# Patient Record
Sex: Male | Born: 1955
Health system: Southern US, Community
[De-identification: ages and names within clinical notes are randomized; demographics above are authoritative.]

## PROBLEM LIST (undated history)

## (undated) DIAGNOSIS — R05 Cough: Secondary | ICD-10-CM

## (undated) DIAGNOSIS — R221 Localized swelling, mass and lump, neck: Secondary | ICD-10-CM

## (undated) DIAGNOSIS — C61 Malignant neoplasm of prostate: Secondary | ICD-10-CM

## (undated) DIAGNOSIS — K635 Polyp of colon: Secondary | ICD-10-CM

## (undated) DIAGNOSIS — Z87442 Personal history of urinary calculi: Secondary | ICD-10-CM

## (undated) DIAGNOSIS — R059 Cough, unspecified: Secondary | ICD-10-CM

## (undated) DIAGNOSIS — K219 Gastro-esophageal reflux disease without esophagitis: Secondary | ICD-10-CM

## (undated) DIAGNOSIS — B49 Unspecified mycosis: Secondary | ICD-10-CM

## (undated) DIAGNOSIS — C4490 Unspecified malignant neoplasm of skin, unspecified: Secondary | ICD-10-CM

## (undated) DIAGNOSIS — R071 Chest pain on breathing: Secondary | ICD-10-CM

## (undated) DIAGNOSIS — N200 Calculus of kidney: Secondary | ICD-10-CM

## (undated) DIAGNOSIS — R220 Localized swelling, mass and lump, head: Secondary | ICD-10-CM

## (undated) DIAGNOSIS — G629 Polyneuropathy, unspecified: Secondary | ICD-10-CM

## (undated) DIAGNOSIS — M1990 Unspecified osteoarthritis, unspecified site: Secondary | ICD-10-CM

## (undated) DIAGNOSIS — D869 Sarcoidosis, unspecified: Secondary | ICD-10-CM

## (undated) DIAGNOSIS — H269 Unspecified cataract: Secondary | ICD-10-CM

## (undated) DIAGNOSIS — E291 Testicular hypofunction: Secondary | ICD-10-CM

## (undated) DIAGNOSIS — T7840XA Allergy, unspecified, initial encounter: Secondary | ICD-10-CM

## (undated) HISTORY — PX: HERNIA REPAIR: SHX51

## (undated) HISTORY — DX: Calculus of kidney: N20.0

## (undated) HISTORY — DX: Sarcoidosis, unspecified: D86.9

## (undated) HISTORY — PX: LYMPH NODE BIOPSY: SHX201

## (undated) HISTORY — DX: Unspecified malignant neoplasm of skin, unspecified: C44.90

## (undated) HISTORY — DX: Testicular hypofunction: E29.1

## (undated) HISTORY — DX: Malignant neoplasm of prostate: C61

## (undated) HISTORY — DX: Unspecified osteoarthritis, unspecified site: M19.90

## (undated) HISTORY — PX: SHOULDER SURGERY: SHX246

## (undated) HISTORY — DX: Cough, unspecified: R05.9

## (undated) HISTORY — DX: Gastro-esophageal reflux disease without esophagitis: K21.9

## (undated) HISTORY — DX: Unspecified mycosis: B49

## (undated) HISTORY — DX: Polyp of colon: K63.5

## (undated) HISTORY — DX: Polyneuropathy, unspecified: G62.9

## (undated) HISTORY — DX: Localized swelling, mass and lump, neck: R22.1

## (undated) HISTORY — DX: Cough: R05

## (undated) HISTORY — DX: Localized swelling, mass and lump, head: R22.0

## (undated) HISTORY — DX: Chest pain on breathing: R07.1

## (undated) HISTORY — DX: Allergy, unspecified, initial encounter: T78.40XA

## (undated) HISTORY — DX: Unspecified cataract: H26.9

---

## 1982-09-17 HISTORY — PX: OTHER SURGICAL HISTORY: SHX169

## 2003-08-03 ENCOUNTER — Encounter: Payer: Self-pay | Admitting: Gastroenterology

## 2006-11-16 LAB — HM COLONOSCOPY

## 2007-12-27 ENCOUNTER — Encounter: Admission: RE | Admit: 2007-12-27 | Discharge: 2007-12-27 | Payer: Self-pay | Admitting: Family Medicine

## 2008-03-12 ENCOUNTER — Ambulatory Visit: Payer: Self-pay

## 2008-03-18 ENCOUNTER — Ambulatory Visit: Payer: Self-pay

## 2008-03-18 ENCOUNTER — Encounter: Payer: Self-pay | Admitting: Cardiovascular Disease

## 2008-03-18 ENCOUNTER — Ambulatory Visit: Payer: Self-pay | Admitting: Cardiovascular Disease

## 2008-04-27 ENCOUNTER — Ambulatory Visit (HOSPITAL_COMMUNITY): Admission: RE | Admit: 2008-04-27 | Discharge: 2008-04-27 | Payer: Self-pay | Admitting: Orthopedic Surgery

## 2008-06-18 ENCOUNTER — Ambulatory Visit: Payer: Self-pay | Admitting: Cardiovascular Disease

## 2008-09-17 HISTORY — PX: PROSTATE SURGERY: SHX751

## 2008-09-22 ENCOUNTER — Ambulatory Visit: Payer: Self-pay | Admitting: Gastroenterology

## 2008-10-05 ENCOUNTER — Encounter: Payer: Self-pay | Admitting: Gastroenterology

## 2008-10-05 ENCOUNTER — Ambulatory Visit: Payer: Self-pay | Admitting: Gastroenterology

## 2008-10-07 ENCOUNTER — Encounter: Payer: Self-pay | Admitting: Gastroenterology

## 2008-12-30 ENCOUNTER — Encounter: Admission: RE | Admit: 2008-12-30 | Discharge: 2008-12-30 | Payer: Self-pay | Admitting: Orthopedic Surgery

## 2009-02-24 ENCOUNTER — Ambulatory Visit (HOSPITAL_BASED_OUTPATIENT_CLINIC_OR_DEPARTMENT_OTHER): Admission: RE | Admit: 2009-02-24 | Discharge: 2009-02-24 | Payer: Self-pay | Admitting: Orthopedic Surgery

## 2009-05-26 ENCOUNTER — Encounter (INDEPENDENT_AMBULATORY_CARE_PROVIDER_SITE_OTHER): Payer: Self-pay | Admitting: *Deleted

## 2009-07-04 DIAGNOSIS — R0609 Other forms of dyspnea: Secondary | ICD-10-CM | POA: Insufficient documentation

## 2009-07-04 DIAGNOSIS — R0602 Shortness of breath: Secondary | ICD-10-CM

## 2009-07-04 DIAGNOSIS — R0600 Dyspnea, unspecified: Secondary | ICD-10-CM | POA: Insufficient documentation

## 2009-07-05 ENCOUNTER — Ambulatory Visit: Payer: Self-pay | Admitting: Cardiovascular Disease

## 2009-08-17 DIAGNOSIS — C61 Malignant neoplasm of prostate: Secondary | ICD-10-CM

## 2009-08-17 HISTORY — DX: Malignant neoplasm of prostate: C61

## 2009-08-22 ENCOUNTER — Inpatient Hospital Stay (HOSPITAL_COMMUNITY): Admission: RE | Admit: 2009-08-22 | Discharge: 2009-08-23 | Payer: Self-pay | Admitting: Urology

## 2009-08-22 ENCOUNTER — Encounter (INDEPENDENT_AMBULATORY_CARE_PROVIDER_SITE_OTHER): Payer: Self-pay | Admitting: Urology

## 2010-05-17 ENCOUNTER — Other Ambulatory Visit: Admission: RE | Admit: 2010-05-17 | Discharge: 2010-05-17 | Payer: Self-pay | Admitting: Otolaryngology

## 2010-05-24 ENCOUNTER — Encounter: Admission: RE | Admit: 2010-05-24 | Discharge: 2010-05-24 | Payer: Self-pay | Admitting: Otolaryngology

## 2010-07-06 ENCOUNTER — Encounter (INDEPENDENT_AMBULATORY_CARE_PROVIDER_SITE_OTHER): Payer: Self-pay | Admitting: *Deleted

## 2010-07-10 ENCOUNTER — Ambulatory Visit: Payer: Self-pay | Admitting: Infectious Disease

## 2010-07-10 DIAGNOSIS — R05 Cough: Secondary | ICD-10-CM

## 2010-07-10 DIAGNOSIS — R059 Cough, unspecified: Secondary | ICD-10-CM | POA: Insufficient documentation

## 2010-07-10 LAB — CONVERTED CEMR LAB
ALT: 18 units/L (ref 0–53)
AST: 17 units/L (ref 0–37)
Albumin: 4 g/dL (ref 3.5–5.2)
Alkaline Phosphatase: 68 units/L (ref 39–117)
BUN: 17 mg/dL (ref 6–23)
Basophils Absolute: 0 10*3/uL (ref 0.0–0.1)
Basophils Relative: 1 % (ref 0–1)
Bilirubin Urine: NEGATIVE
CMV IgM: 0.52 (ref <0.90)
CO2: 24 meq/L (ref 19–32)
Calcium: 9.8 mg/dL (ref 8.4–10.5)
Chloride: 105 meq/L (ref 96–112)
Creatinine, Ser: 1.01 mg/dL (ref 0.40–1.50)
Crypto Ag: NEGATIVE
Cytomegalovirus Ab-IgG: 0.83 (ref <0.90)
EBV NA IgG: 2.35 — ABNORMAL HIGH
EBV VCA IgG: 3.32 — ABNORMAL HIGH
EBV VCA IgM: 0.2
Eosinophils Absolute: 0.2 10*3/uL (ref 0.0–0.7)
Eosinophils Relative: 5 % (ref 0–5)
Ferritin: 141 ng/mL (ref 22–322)
Glucose, Bld: 111 mg/dL — ABNORMAL HIGH (ref 70–99)
HCT: 41.6 % (ref 39.0–52.0)
HIV 1 RNA Quant: <20 copies/mL (ref <20)
HIV-1 RNA Quant, Log: <1.3 (ref <1.30)
HIV: NONREACTIVE
Hemoglobin, Urine: NEGATIVE
Hemoglobin: 14.3 g/dL (ref 13.0–17.0)
Ketones, ur: NEGATIVE mg/dL
Leukocytes, UA: NEGATIVE
Lymphocytes Relative: 23 % (ref 12–46)
Lymphs Abs: 1.2 10*3/uL (ref 0.7–4.0)
MCHC: 34.4 g/dL (ref 30.0–36.0)
MCV: 83.5 fL (ref 78.0–100.0)
Monocytes Absolute: 0.8 10*3/uL (ref 0.1–1.0)
Monocytes Relative: 15 % — ABNORMAL HIGH (ref 3–12)
Neutro Abs: 3 10*3/uL (ref 1.7–7.7)
Neutrophils Relative %: 57 % (ref 43–77)
Nitrite: NEGATIVE
Platelets: 146 10*3/uL — ABNORMAL LOW (ref 150–400)
Potassium: 4.5 meq/L (ref 3.5–5.3)
Protein, ur: NEGATIVE mg/dL
RBC: 4.98 M/uL (ref 4.22–5.81)
RDW: 13 % (ref 11.5–15.5)
Sed Rate: 14 mm/hr (ref 0–16)
Sodium: 144 meq/L (ref 135–145)
Specific Gravity, Urine: 1.013 (ref 1.005–1.030)
Total Bilirubin: 0.7 mg/dL (ref 0.3–1.2)
Total Protein: 6.6 g/dL (ref 6.0–8.3)
Toxoplasma Antibody- IgM: <0.1
Toxoplasma IgG Ratio: 0.7
Urine Glucose: NEGATIVE mg/dL
Urobilinogen, UA: 0.2 (ref 0.0–1.0)
WBC: 5.2 10*3/uL (ref 4.0–10.5)
pH: 6.5 (ref 5.0–8.0)

## 2010-07-13 ENCOUNTER — Encounter: Payer: Self-pay | Admitting: Infectious Disease

## 2010-07-14 ENCOUNTER — Encounter: Payer: Self-pay | Admitting: Infectious Disease

## 2010-07-14 ENCOUNTER — Telehealth: Payer: Self-pay | Admitting: Infectious Disease

## 2010-07-17 ENCOUNTER — Encounter: Payer: Self-pay | Admitting: Infectious Disease

## 2010-07-18 ENCOUNTER — Ambulatory Visit: Payer: Self-pay | Admitting: Infectious Disease

## 2010-07-18 ENCOUNTER — Ambulatory Visit (HOSPITAL_COMMUNITY): Admission: RE | Admit: 2010-07-18 | Discharge: 2010-07-18 | Payer: Self-pay | Admitting: Infectious Disease

## 2010-07-19 ENCOUNTER — Encounter: Payer: Self-pay | Admitting: Infectious Disease

## 2010-07-26 ENCOUNTER — Ambulatory Visit: Payer: Self-pay | Admitting: Infectious Disease

## 2010-08-08 ENCOUNTER — Telehealth (INDEPENDENT_AMBULATORY_CARE_PROVIDER_SITE_OTHER): Payer: Self-pay | Admitting: *Deleted

## 2010-08-09 ENCOUNTER — Ambulatory Visit (HOSPITAL_COMMUNITY): Admission: RE | Admit: 2010-08-09 | Discharge: 2010-08-09 | Payer: Self-pay | Admitting: Otolaryngology

## 2010-08-09 ENCOUNTER — Encounter: Payer: Self-pay | Admitting: Infectious Disease

## 2010-08-15 ENCOUNTER — Encounter: Payer: Self-pay | Admitting: Infectious Disease

## 2010-08-17 DIAGNOSIS — D869 Sarcoidosis, unspecified: Secondary | ICD-10-CM

## 2010-08-17 HISTORY — DX: Sarcoidosis, unspecified: D86.9

## 2010-08-18 ENCOUNTER — Encounter: Payer: Self-pay | Admitting: Infectious Disease

## 2010-08-29 ENCOUNTER — Encounter: Payer: Self-pay | Admitting: Infectious Disease

## 2010-08-29 ENCOUNTER — Ambulatory Visit: Payer: Self-pay | Admitting: Infectious Disease

## 2010-09-17 DIAGNOSIS — K219 Gastro-esophageal reflux disease without esophagitis: Secondary | ICD-10-CM

## 2010-09-17 HISTORY — DX: Gastro-esophageal reflux disease without esophagitis: K21.9

## 2010-09-20 ENCOUNTER — Telehealth: Payer: Self-pay | Admitting: Gastroenterology

## 2010-09-20 ENCOUNTER — Encounter: Payer: Self-pay | Admitting: Gastroenterology

## 2010-09-25 ENCOUNTER — Encounter: Payer: Self-pay | Admitting: Infectious Disease

## 2010-09-28 ENCOUNTER — Encounter: Payer: Self-pay | Admitting: Gastroenterology

## 2010-09-28 ENCOUNTER — Ambulatory Visit
Admission: RE | Admit: 2010-09-28 | Discharge: 2010-09-28 | Payer: Self-pay | Source: Home / Self Care | Attending: Gastroenterology | Admitting: Gastroenterology

## 2010-09-28 DIAGNOSIS — D860 Sarcoidosis of lung: Secondary | ICD-10-CM | POA: Insufficient documentation

## 2010-09-28 DIAGNOSIS — D869 Sarcoidosis, unspecified: Secondary | ICD-10-CM | POA: Insufficient documentation

## 2010-09-28 DIAGNOSIS — R195 Other fecal abnormalities: Secondary | ICD-10-CM | POA: Insufficient documentation

## 2010-09-28 DIAGNOSIS — K219 Gastro-esophageal reflux disease without esophagitis: Secondary | ICD-10-CM | POA: Insufficient documentation

## 2010-09-28 DIAGNOSIS — C61 Malignant neoplasm of prostate: Secondary | ICD-10-CM | POA: Insufficient documentation

## 2010-10-01 ENCOUNTER — Encounter: Payer: Self-pay | Admitting: Infectious Disease

## 2010-10-02 ENCOUNTER — Encounter: Payer: Self-pay | Admitting: Infectious Disease

## 2010-10-02 ENCOUNTER — Ambulatory Visit
Admission: RE | Admit: 2010-10-02 | Discharge: 2010-10-02 | Payer: Self-pay | Source: Home / Self Care | Attending: Infectious Disease | Admitting: Infectious Disease

## 2010-10-02 LAB — CONVERTED CEMR LAB
BUN: 22 mg/dL (ref 6–23)
Basophils Absolute: 0 10*3/uL (ref 0.0–0.1)
Basophils Relative: 0 % (ref 0–1)
CO2: 28 meq/L (ref 19–32)
Calcium: 9.9 mg/dL (ref 8.4–10.5)
Chloride: 101 meq/L (ref 96–112)
Creatinine, Ser: 0.95 mg/dL (ref 0.40–1.50)
Eosinophils Absolute: 0.1 10*3/uL (ref 0.0–0.7)
Eosinophils Relative: 1 % (ref 0–5)
Glucose, Bld: 102 mg/dL — ABNORMAL HIGH (ref 70–99)
HCT: 43 % (ref 39.0–52.0)
Hemoglobin: 13.9 g/dL (ref 13.0–17.0)
Lymphocytes Relative: 7 % — ABNORMAL LOW (ref 12–46)
Lymphs Abs: 0.7 10*3/uL (ref 0.7–4.0)
MCHC: 32.3 g/dL (ref 30.0–36.0)
MCV: 86.9 fL (ref 78.0–100.0)
Monocytes Absolute: 0.7 10*3/uL (ref 0.1–1.0)
Monocytes Relative: 6 % (ref 3–12)
Neutro Abs: 9.3 10*3/uL — ABNORMAL HIGH (ref 1.7–7.7)
Neutrophils Relative %: 86 % — ABNORMAL HIGH (ref 43–77)
Platelets: 166 10*3/uL (ref 150–400)
Potassium: 4.8 meq/L (ref 3.5–5.3)
RBC: 4.95 M/uL (ref 4.22–5.81)
RDW: 12.9 % (ref 11.5–15.5)
Sodium: 137 meq/L (ref 135–145)
WBC: 10.8 10*3/uL — ABNORMAL HIGH (ref 4.0–10.5)

## 2010-10-04 ENCOUNTER — Encounter: Payer: Self-pay | Admitting: Infectious Disease

## 2010-10-06 ENCOUNTER — Ambulatory Visit
Admission: RE | Admit: 2010-10-06 | Discharge: 2010-10-06 | Payer: Self-pay | Source: Home / Self Care | Attending: Gastroenterology | Admitting: Gastroenterology

## 2010-10-06 ENCOUNTER — Other Ambulatory Visit: Payer: Self-pay | Admitting: Gastroenterology

## 2010-10-08 ENCOUNTER — Encounter: Payer: Self-pay | Admitting: Family Medicine

## 2010-10-12 ENCOUNTER — Encounter: Payer: Self-pay | Admitting: Gastroenterology

## 2010-10-17 NOTE — Consult Note (Signed)
Summary: New Pt. Referral: [facility]  New Pt. Referral: [facility]   Imported By: Florinda Marker 07/18/2010 10:35:53  _____________________________________________________________________  External Attachment:    Type:   Image     Comment:   External Document

## 2010-10-17 NOTE — Progress Notes (Signed)
Summary: Records Request  Faxed Stress to Clydie Braun at [facility] ([phone]). Debby Freiberg  August 08, 2010 12:40 PM

## 2010-10-17 NOTE — Assessment & Plan Note (Signed)
Summary: 2WK F/U/OK PER DR VAM DAM/VS   Visit Type:  Follow-up Referring Provider:  Dr. [name]  CC:  coughing and f/u.  History of Present Illness: 55 yo with cough since April, at times to point of gagging and vomiting. Cough brings up phlegm. He has not had noticeable fever. His weight has been steady. He went to PCP who did CXR's at [facility]. He has been given antibiotics which did not help with the cough. His lymph nodes began to enlarge in August and he had CT scan in September showing mx LN enlarged bilaterally and tree and bud findings in apices with an area of conglomeration.  He had excisional LN biopsy which showed noncaseating granulomata. No AFB or fungi seen on stains but no cultures were done from this LN. .Pt has not noticed change in LN size other than one that was removed. Has lived in [place] for most of his life. He has been to [place], [place]. No other travel. No weight loss, no fevers, no chills. His mother has cat and kittens but he has littel exposure to them. I checked quantiferon gold which was indeterminate. I had 3 sputa sent for AFB with smears negative and cultures negative to date. I checked HIV ab and RNA, urine histoplasma ag, serum crypto ag, blastomyces abs, coccidioides abs, bartonella abs, toxoplasmosis abs, CMV which were all negative. EBV serologies c/w past infection. ESR normal. ACE level nml. I repeated Ct of neck which showed:  Right greater than left widespread cervical and thoracic inlet lymphadenopathy, stable aside from mild progression at left levelIIA. I did CT chest which showed:  Bilateral interstitial and micronodular opacities (primarily upperlobes), calcified mediastinal/hilar lymph nodes and multiple smallhypodense splenic lesions/granulomas -" compatible with sarcoid."   I spent over45 on this visit with greater than 50% of time spent face to face counselling the pt reviewing his tests and radiographs and coordinating care.  Preventive  Screening-Counseling & Management  Alcohol-Tobacco     Alcohol drinks/day: 0     Smoking Status: never   Current Allergies (reviewed today): No known allergies  Past History:  Past Medical History: Last updated: 07/10/2010 Current Problems:  DYSPNEA (ICD-786.05) CHEST PAIN (ICD-786.50) normal myovue 02/2008 Prostate  cancer Nasal polyps in nose allergies to corn, pollen dust mites  Past Surgical History: Last updated: 07/10/2010  Open resection distal right clavicle.    Right shoulder surgery LN excisional biopsy by Dr. Arn Medal Hernia repair 13 yrs ago  Family History: Last updated: 07/04/2009  remarkable for colon cancer.   Social History: Last updated: 07/05/2009 Married Works at [employer] Non-smoker Non-drinker Primary is Vernon Prey  Risk Factors: Alcohol Use: 0 (07/26/2010)  Risk Factors: Smoking Status: never (07/26/2010)  Review of Systems       The patient complains of dyspnea on exertion, prolonged cough, and headaches.  The patient denies anorexia, fever, weight loss, weight gain, vision loss, decreased hearing, hoarseness, chest pain, syncope, peripheral edema, hemoptysis, abdominal pain, melena, hematochezia, severe indigestion/heartburn, hematuria, incontinence, genital sores, muscle weakness, suspicious skin lesions, transient blindness, difficulty walking, depression, unusual weight change, abnormal bleeding, enlarged lymph nodes, and angioedema.    Vital Signs:  Patient profile:   55 year old male Height:      70 inches (177.80 cm) Weight:      179.75 pounds (81.70 kg) BMI:     25.88 Temp:     98.1 degrees F (36.72 degrees C) oral Pulse rate:   80 / minute BP sitting:  133 / 68  (left arm)  Vitals Entered By: Starleen Arms CMA (July 26, 2010 10:16 AM) CC: coughing, f/u Is Patient Diabetic? No Pain Assessment Patient in pain? no      Nutritional Status BMI of 25 - 29 = overweight Nutritional Status Detail nl  Does  patient need assistance? Functional Status Self care Ambulation Normal   Physical Exam  General:  alert, well-developed, and well-nourished.   Head:  normocephalic, atraumatic, and no abnormalities observed.   Eyes:  vision grossly intact, pupils equal, and pupils round.   Ears:  no external deformities.   Nose:  no external deformity and no nasal discharge.   Mouth:  pharynx pink and moist, no erythema, and no exudates.   Neck:  supple and full ROM.   Lungs:  coughs easilyno crackles and no wheezes.   Heart:  normal rate, regular rhythm, no murmur, no gallop, and no rub.   Abdomen:  soft and non-tender.   Msk:  normal ROM, no joint tenderness, and no joint swelling.   Extremities:  No clubbing, cyanosis, edema, or deformity noted with normal full range of motion of all joints.   Neurologic:  alert & oriented X3, strength normal in all extremities, and gait normal.   Skin:  turgor normal, color normal, and no rashes.   Cervical Nodes:  he has large freely movable tender lymphadenopathy Psych:  Oriented X3, memory intact for recent and remote, and normally interactive.     Impression & Recommendations:  Problem # 1:  ACUTE LYMPHADENITIS (ICD-683)  I told him that I REALLY WOULD LIKE TO get repeat material for AFB, fungal, bacterial cultures and pathology. This could be done with FNA but I would prefer repeat excisional Lymph node biopsy. I think sarcoid is high on the differential but if I treat him with steroids and he has a nonTB mycobacteria, TB or fungal infection and this blossoms I will not know what anti mycobacterial vs antifungal agent to reach for. I will ask Dr. [name] if he can do repeat exicisional LN biopsy from neck and ahve it sent for AFB, fungal and bacterial cultures and repeat Path exam  Orders: Est. Patient Level V ([address])  Problem # 2:  PULMONARY NODULE (ICD-518.89)  see above discussion  Orders: Est. Patient Level V (60454)  Problem # 3:  COUGH  (ICD-786.2)  see above  Orders: Est. Patient Level V (09811)  Patient Instructions: 1)  I would recommend repeat excisional Lymph node biopsy by Dr.Byers  with specimens sent for AFB, fungal and bacterial cultures and pathology 2)  rtc to see Dr. Daiva Eves 3)  the 3 leading contenders for cause of your problem are 4)  sarcoidoisis 5)  non tuberculous mycobacterium (mycobacterium avium, chelonae ex) 6)  fungi (cryptococcus, histoplasmosis, coccidioides, blastomyces)

## 2010-10-17 NOTE — Progress Notes (Signed)
Summary: Pt called with additional info  Phone Note Call from Patient   Summary of Call: Pt had been to [facility] once therefore testing for cocci is in order .Will send serology for cocci as well. WIll likely have to push for bronch or new excisional LN biopsy to know what caused his LN enlargement and his ongoing cough Initial call taken by: Acey Lav MD,  July 17, 2010 9:40 AM

## 2010-10-17 NOTE — Letter (Signed)
Summary: Out of Work  [facility]  [address]   [place], [place] [address]   Phone: [phone]  Fax: [phone]    July 10, 2010   Employee:  Philip Hernandez    To Whom It May Concern:   For Medical reasons, please excuse the above named employee from work for the following dates:  Start:   07/10/10   End:   to be determined  If you need additional information, please feel free to contact our office.         Sincerely,    Acey Lav MD

## 2010-10-17 NOTE — Miscellaneous (Signed)
Summary: clinical list update  Clinical Lists Changes  Problems: Added new problem of NECK MASS (ICD-784.2)

## 2010-10-17 NOTE — Miscellaneous (Signed)
Summary: HIPAA Restrictions  HIPAA Restrictions   Imported By: Florinda Marker 07/10/2010 14:48:59  _____________________________________________________________________  External Attachment:    Type:   Image     Comment:   External Document

## 2010-10-17 NOTE — Assessment & Plan Note (Signed)
Summary: new pt neck mass/   Visit Type:  Consult Referring Provider:  Dr. [name]  CC:  referred by Dr. [name] for neck mass.  History of Present Illness: 55 yo with cough since April, at times to point of gagging and vomiting. Cough brings up phlegm. He has not had noticeable fever. His weight has been steady. He went to PCP who did CXR's at [facility]. He has been given antibiotics which did not help with the cough. His lymph nodes began to enlarge in August and he had CT scan in September showing mx LN enlarged bilaterally and tree and bud findings in apices with an area of conglomeration.  He had excisional LN biopsy which showed noncaseating granulomata. No AFB or fungi seen on stains but no cultures were done from this LN. .Pt has not noticed change in LN size other than one that was removed. Has lived in [place] for most of his life. He has been to [place], [place]. No other travel. No weight loss, no fevers, no chills. His mother has cat and kittens but he has littel exposure to them. I reviewed his CT neck and chest with radiology and they felt that this defniintely could be pulmonary TB esp in areas of more confluent inflammatoin..  I spent over an hour on this visit with greater than 50% of time spent face to face counselling the pt and coordinating care.  Problems Prior to Update: 1)  Neck Mass  (ICD-784.2) 2)  Dyspnea  (ICD-786.05) 3)  Chest Pain  (ICD-786.50)  Medications Prior to Update: 1)  Aspirin Ec 325 Mg Tbec (Aspirin) .... As Needed 2)  Flomax 0.4 Mg Caps (Tamsulosin Hcl) .Marland Kitchen.. 1 Tab By Mouth Once Daily 3)  Ibuprofen 400 Mg Tabs (Ibuprofen) .... As Needed 4)  Claritin 10 Mg Tabs (Loratadine) .... As Needed  Current Medications (verified): 1)  Aspirin Ec 325 Mg Tbec (Aspirin) .... As Needed 2)  Ibuprofen 400 Mg Tabs (Ibuprofen) .... As Needed 3)  Claritin 10 Mg Tabs (Loratadine) .... As Needed  Allergies (verified): No Known Drug Allergies    Current Allergies  (reviewed today): No known allergies  Past History:  Past Medical History: Current Problems:  DYSPNEA (ICD-786.05) CHEST PAIN (ICD-786.50) normal myovue 02/2008 Prostate  cancer Nasal polyps in nose allergies to corn, pollen dust mites  Past Surgical History:  Open resection distal right clavicle.    Right shoulder surgery LN excisional biopsy by Dr. Arn Medal Hernia repair 13 yrs ago  Review of Systems       The patient complains of chest pain, dyspnea on exertion, prolonged cough, and enlarged lymph nodes.  The patient denies anorexia, fever, weight loss, weight gain, vision loss, decreased hearing, hoarseness, syncope, peripheral edema, headaches, hemoptysis, abdominal pain, melena, hematochezia, severe indigestion/heartburn, hematuria, incontinence, genital sores, muscle weakness, suspicious skin lesions, transient blindness, difficulty walking, depression, unusual weight change, and abnormal bleeding.    Vital Signs:  Patient profile:   55 year old male Height:      70 inches (177.80 cm) Weight:      179.75 pounds (81.70 kg) BMI:     25.88 Temp:     98.1 degrees F (36.72 degrees C) oral Pulse rate:   98 / minute BP sitting:   155 / 117  (left arm)  Vitals Entered By: Starleen Arms CMA (July 10, 2010 1:57 PM) CC: referred by Dr. [name] for neck mass Is Patient Diabetic? No Pain Assessment Patient in pain? no  Nutritional Status BMI of 25 - 29 = overweight Nutritional Status Detail nl  Does patient need assistance? Functional Status Self care Ambulation Normal   Physical Exam  General:  alert, well-developed, and well-nourished.   Head:  normocephalic, atraumatic, and no abnormalities observed.   Eyes:  vision grossly intact, pupils equal, and pupils round.   Ears:  no external deformities.   Nose:  no external deformity and no nasal discharge.   Mouth:  pharynx pink and moist, no erythema, and no exudates.   Neck:  supple and full ROM.   Lungs:   coughs easilyno crackles and no wheezes.   Heart:  normal rate, regular rhythm, no murmur, no gallop, and no rub.   Abdomen:  soft and non-tender.   Msk:  normal ROM, no joint tenderness, and no joint swelling.   Extremities:  No clubbing, cyanosis, edema, or deformity noted with normal full range of motion of all joints.   Neurologic:  alert & oriented X3, strength normal in all extremities, and gait normal.   Cervical Nodes:  he has large freely movable tender lymphadenopathy Psych:  Oriented X3, memory intact for recent and remote, and normally interactive.     Impression & Recommendations:  Problem # 1:  ACUTE LYMPHADENITIS (ICD-683) Concern from pt and public health standpoint is for TB. I think it is less likely given granulomas were noncaseating and no afb seen on path slides and ppd negative. However this diagnosis is by NO means ruled out and with data at hand. It could also be a NTmycobacteria, fungus, CSD, toxo or sarcoidosisI will check quantiferon gold now, urine histo ag, serum blasto, serum crypto ag, cmv, ebv serologies, hiv ab and rna AFB sputa x3, urine histoplasma ag, serum ACE level. In the interim he should wear a mask while in public and minimize public exposure until we have more data Orders: T-Comprehensive Metabolic Panel [id number]) T-C-Reactive Protein [id number]) T-CBC w/Diff [id number]) T-Ferritin [id number]) T-Sed Rate (Automated) [id number]) T- * Misc. Laboratory test [id number]) T- * Misc. Laboratory test [id number]) T- * Misc. Laboratory test [id number]) T-HIV Antibody  (Reflex) [id number]) T-HIV Viral Load [id number]) T- * Misc. Laboratory test [phone]) T- * Misc. Laboratory test [id number]) T- * Misc. Laboratory test [id number]) T- * Misc. Laboratory test [id number]) T-CMV IgG Antibody [phone]) T-CMV IgM  Antibody ([id number]) T-Epstein-Barr virus, early antigen ([id number]) T-Epstein-Barr virus, nuclear antigen [phone]) T-Epstein-Barr virus, viral  capsid ([id number]) CT with Contrast (CT w/ contrast) Consultation Level V (18299)  Problem # 2:  COUGH (ICD-786.2) see above discussion Orders: T-Culture & Smear AFB ([id number]) T-Culture & Smear AFB ([id number]) T-Culture & Smear AFB ([id number]) CT with Contrast (CT w/ contrast) Consultation Level V (37169)  Problem # 3:  NECK MASS (ICD-784.2)  see above discussion  Orders: Consultation Level V (67893)  Patient Instructions: 1)  We need to get 3 sputum samples for AFB culture  2)  We will get blood work today 3)  I will get a repeat CT scan of the lungs, chest 4)  You should wear a mask at all times when in public 5)  Minimize your time in public places for now 6)  rtc to see Dr. Daiva Eves

## 2010-10-19 NOTE — Procedures (Signed)
Summary: colonoscopy   Colonoscopy  Procedure date:  08/03/2003  Findings:      Results: Normal. Location:  [facility].    Comments:      Repeat colonoscopy in 5 years.  Patient Name: Philip Hernandez, Philip Hernandez MRN:  Procedure Procedures: Colonoscopy CPT: [address].  Personnel: Endoscopist: Barbette Hair. Arlyce Dice, MD.  Indications  Increased Risk Screening: For family history of colorectal neoplasia, in  parent age at onset: 33.  History  Current Medications: Patient is not currently taking Coumadin.  Pre-Exam Physical: Performed Aug 03, 2003. Entire physical exam was normal.  Exam Exam: Extent of exam reached: Cecum, extent intended: Cecum.  The cecum was identified by IC valve. Colon retroflexion performed. ASA Classification: I. Tolerance: good.  Monitoring: Pulse and BP monitoring, Oximetry used. Supplemental O2 given. at 2 Liters.  Colon Prep Used Golytely for colon prep. Prep results: good.  Sedation Meds: Fentanyl 100 mcg. given IV. Versed 8 mg. given IV.  Findings NORMAL EXAM: Cecum.  - NORMAL EXAM: Cecum to Rectum.   Assessment Normal examination.  Events  Unplanned Interventions: No intervention was required.  Unplanned Events: There were no complications. Plans Patient Education: Patient given standard instructions for: a normal exam.  Scheduling/Referral: Colonoscopy, to Molly Maduro D. Arlyce Dice, MD, around Aug 02, 2008.    This report was created from the original endoscopy report, which was reviewed and signed by the above listed endoscopist.

## 2010-10-19 NOTE — Assessment & Plan Note (Signed)
Summary: 31month f/u [mkj]   Visit Type:  Follow-up Referring Provider:  na Primary Provider:  Rudi Heap, MD   CC:  f/u  and red bumps on face and chest.  History of Present Illness: 55 yo with cough since April, and also developed cervical lymphadenopathy and apparent sarcoidosis.  He had excisional LN biopsy which showed noncaseating granulomata in the fall but no cultures had been sent CT chest which showed: Bilateral interstitial and micronodular opacities (primarily upperlobes), calcified mediastinal/hilar lymph nodes and multiple smallhypodense splenic lesions/granulomas -" compatible with sarcoid."He brought expectorated sputum to clinic which was sent for AFB culture and which grew pseudoascherlia boydii (mould)-1 culture , Mycobacterium fortuitum/mucogenicum 2nd culture and no AFB on remaining 2 sputum cultures. He underwent repeat excisinal LN biopsy by Dr. [name]. Path showed noncaseating granulomata with AFB, fungal stains negative and MTB PCR negative Quantiferon gold which was indeterminate, ACE level was normal (see remaining labs for fungal abs anca's etc). At my last clinic I decided to go ahead an treat him for sarcoid with prednisone. He responded dramatically and says he felt better the very day that he began steroids. He has felt dramatic improvement in his energy level and his cough has also dramatically improved. Unfortunately while on the steroids he developed abdominal pain and was found to have guiac positive stools. He is to have EGD this Frday by Dr Arlyce Dice. I had his PCP drop his prednisone  from  40mg  to 20mg  and she started him omeprazole. Since dropping the dose he has had return of ssx of fatigue but cough no worse. He also has noticed papular rash on his face and shoulders.  I spent 45 minutes with this pt including greater than 50% of time counselling the pt and coordinating care. 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 As Needed 2)  Ibuprofen 400 Mg Tabs (Ibuprofen) .... As Needed 3)  Claritin 10 Mg Tabs (Loratadine) .... As Needed 4)  Prednisone 20 Mg Tabs (Prednisone) .... One Tablet By Mouth Once Daily 5)  Vitamin D 1000 Unit Tabs (Cholecalciferol) .... One Tablet By Mouth Once Daily 6)  Omeprazole 40 Mg Cpdr (Omeprazole) .... One Tablet By Mouth Once Daily 7)  Hyomax-Sl 0.125 Mg Subl (Hyoscyamine Sulfate) .... Take 2 Tablets Sublingual Q.4 H. P.r.n.  Current Medications (verified): 1)  Aspirin Ec 325 Mg Tbec (Aspirin) .... As Needed 2)  Ibuprofen 400 Mg Tabs (Ibuprofen) .... As Needed 3)  Claritin 10 Mg Tabs (Loratadine) .... As Needed 4)  Prednisone 20 Mg Tabs (Prednisone) .... One Tablet By Mouth Once Daily 5)  Vitamin D 1000 Unit Tabs (Cholecalciferol) .... One Tablet By Mouth Once Daily 6)  Omeprazole 40 Mg Cpdr (Omeprazole) .... One Tablet By Mouth Once Daily 7)  Hyomax-Sl 0.125 Mg Subl (Hyoscyamine Sulfate) .... Take 2 Tablets Sublingual Q.4 H. P.r.n.  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   Right shoulder surgery LN excisional biopsy by Dr. Arn Medal Hernia repair 13 yrs ago Prostate Surgery   Family History: Last updated: 09/28/2010  remarkable for colon cancer.  aunt with head and neck cancer  Social History: Last updated: 07/05/2009 Married Works at [employer] Non-smoker Non-drinker Primary is Vernon Prey  Risk Factors: Alcohol Use: 0 (07/26/2010)  Risk Factors: Smoking Status: never (07/26/2010)  Family History: Reviewed history from 09/28/2010 and no changes required.  remarkable for colon cancer.  aunt with head and neck cancer  Social History: Reviewed history from 07/05/2009 and no changes required. Married Works at [employer] Non-drinker Primary is Vernon Prey  Review of Systems       see HPI otherwise negative  Vital Signs:  Patient profile:   55 year old male Height:      70 inches (177.80 cm) Weight:      182 pounds (82.73 kg) BMI:     26.21 Temp:     97.9 degrees F (36.61 degrees C) oral Pulse rate:   77 / minute BP sitting:   146 / 92  (left arm)  Vitals Entered By: Starleen Arms CMA (October 02, 2010 9:38 AM) CC: f/u , red bumps on face and chest Is Patient Diabetic? No Pain Assessment Patient in pain? no      Nutritional Status BMI of 25 - 29 = overweight  Does patient need assistance? Functional Status Self care Ambulation Normal   Physical Exam  General:  alert, well-developed, and well-nourished.   Head:  normocephalic, atraumatic, and no abnormalities observed.   Eyes:  vision grossly intact, pupils equal, and pupils round.   Ears:  no external deformities.   Nose:  no external deformity and no nasal discharge.   Mouth:  pharynx pink and  moist, no erythema, and no exudates.   Neck:  see cervical ln Lungs:  normal respiratory effort, normal breath sounds, and no dullness.   Heart:  normal rate, regular rhythm, no murmur, and no gallop.   Abdomen:  soft and non-tender.   Msk:  normal ROM, no joint tenderness, and no joint swelling.   Extremities:  No clubbing, cyanosis, edema, or deformity noted with normal full range of motion of all joints.   Neurologic:  alert & oriented X3, strength normal in all extremities, and gait normal.   Skin:  papular rash on face and chest with some acneiform rash there as well Cervical Nodes:  only a few enlarged cervical lymph nodes that are  diminished Psych:  Oriented X3, memory intact for recent and remote, and normally interactive.     Impression & Recommendations:  Problem # 1:  SARCOIDOSIS (ICD-135) I am referring him to [facility] for evaluation and treatment. If he has ulcer on EGD would consider dropping the dose to 10mg . Per uptodate pt should ideally receive one yewar of therapy. OF note we never did MRI of brain and he did have significant head achees that improved with his therapy Orders: Pulmonary Referral (Pulmonary) T-CBC w/Diff ([id number]) T-Basic Metabolic Panel [phone])  Est. Patient Level V [phone])  Problem # 2:  NONSPECIFIC ABNORMAL FINDING IN STOOL CONTENTS (ICD-792.1) pt to get egd this Friday  Problem # 3:  ATYPICAL MYCOBACTERIAL INFECTION (ICD-031.9)  pt had mycobacterium mucogenicum on sputum culture and a mold on a second one, I think these are both contaminants  Orders: Est. Patient Level V (82956)   Patient Instructions: 1)  we will refer you to [facility] 2)  we will see what the EGD shows this Friday and may drop the prednisone dose 3)  rtc to Dr Daiva Eves prn

## 2010-10-19 NOTE — Assessment & Plan Note (Signed)
Summary: F/U [MKJ]   Visit Type:  Follow-up Referring Provider:  Dr. [name]  CC:  pain in chest started about 4 weeks ago, not cough as much, and had LN removed 3 weeks ago.  History of Present Illness: 55 yo with cough since April, at times to point of gagging and vomiting. Cough brings up phlegm. He has not had noticeable fever. His weight has been steady. He went to PCP who did CXR's at [facility]. He has been given antibiotics which did not help with the cough. His cervical lymph nodes began to enlarge in August and he had CT scan in September showing mx LN enlarged bilaterally and tree and bud findings in apices with an area of conglomeration.  He had excisional LN biopsy which showed noncaseating granulomata. No AFB or fungi seen on stains but no cultures were done at that time. CT chest which showed: Bilateral interstitial and micronodular opacities (primarily upperlobes), calcified mediastinal/hilar lymph nodes and multiple smallhypodense splenic lesions/granulomas -" compatible with sarcoid."He brought expectorated sputum to clinic which was sent for AFB culture and which grew pseudoascherlia boydii (mould)-1 culture , Mycobacterium fortuitum/mucogenicum 2nd culture and no AFB on remaining 2 sputum cultures. He underwent repeat excisinal LN biopsy by Dr. [name]. Path showed noncaseating granulomata with AFB, fungal stains negative and MTB PCR negative.  Quantiferon gold which was indeterminate, ACE level was normal (see remaining labs for fungal abs anca's etc) Pt has continued to suffer from cough and then developed pleuritic chest pain, sharp 7/10  left sided worse with deep breathing and lying down.  Saw pCP and did CXR which showed no changes.. She gave him cough medicine with codeine to help the pain. Still hurting since. He is also now suffering from nausea without vomiting. He feels profoundly0% fatigued and also has frontal headaches that have been going on for several  months. I spent over 45 minutes with this pt including greater than 50% of time in face to face counselling of the pt and his wife and in coordination of care.  Problems Prior to Update: 1)  Pulmonary Nodule  (ICD-518.89) 2)  Cough  (ICD-786.2) 3)  Acute Lymphadenitis  (ICD-683) 4)  Neck Mass  (ICD-784.2) 5)  Dyspnea  (ICD-786.05) 6)  Chest Pain  (ICD-786.50)  Medications Prior to Update: 1)  Aspirin Ec 325 Mg Tbec (Aspirin) .... As Needed 2)  Ibuprofen 400 Mg Tabs (Ibuprofen) .... As Needed 3)  Claritin 10 Mg Tabs (Loratadine) .... As Needed  Current Medications (verified): 1)  Aspirin Ec 325 Mg Tbec (Aspirin) .... As Needed 2)  Ibuprofen 400 Mg Tabs (Ibuprofen) .... As Needed 3)  Claritin 10 Mg Tabs (Loratadine) .... As Needed  Allergies (verified): No Known Drug Allergies    Current Allergies (reviewed today): No known allergies  Past History:  Past Medical History: Last updated: 07/10/2010 Current Problems:  DYSPNEA (ICD-786.05) CHEST PAIN (ICD-786.50) normal myovue 02/2008 Prostate  cancer Nasal polyps in nose allergies to corn, pollen dust mites  Past Surgical History: Last updated: 07/10/2010  Open resection distal right clavicle.    Right shoulder surgery LN excisional biopsy by Dr. Arn Medal Hernia repair 13 yrs ago  Family History: Last updated: 08/29/2010  remarkable for colon cancer. Now with aunt with head and neck cancer  Social History: Last updated: 07/05/2009 Married Works at [employer] Non-smoker Non-drinker Primary is Vernon Prey  Risk Factors: Alcohol Use: 0 (07/26/2010)  Risk Factors: Smoking Status: never (07/26/2010)  Family History: Reviewed history from  07/04/2009 and no changes required.  remarkable for colon cancer. Now with aunt with head and neck cancer  Social History: Reviewed history from 07/05/2009 and no changes required. Married Works at [employer] Non-drinker Primary is Vernon Prey  Vital  Signs:  Patient profile:   55 year old male Height:      70 inches (177.80 cm) Weight:      180.25 pounds (81.93 kg) BMI:     25.96 Temp:     97.9 degrees F (36.61 degrees C) oral Pulse rate:   74 / minute BP sitting:   121 / 76  (left arm)  Vitals Entered By: Starleen Arms CMA (August 29, 2010 9:15 AM) CC: pain in chest started about 4 weeks ago, not cough as much, had LN removed 3 weeks ago Pain Assessment Patient in pain? no     Location: head Nutritional Status BMI of 25 - 29 = overweight Nutritional Status Detail nl  Does patient need assistance? Functional Status Self care Ambulation Normal   Physical Exam  General:  alert, well-developed, and well-nourished.   Head:  normocephalic, atraumatic, and no abnormalities observed.   Eyes:  vision grossly intact, pupils equal, and pupils round.   Ears:  no external deformities.   Nose:  no external deformity and no nasal discharge.   Mouth:  pharynx pink and moist, no erythema, and no exudates.   Neck:  see cervical ln Chest Wall:  minimal tenderness along rib cage left side Lungs:  coughs easilyno crackles and no wheezes.   Heart:  normal rate, regular rhythm, no murmur, no gallop, and no rub.   Abdomen:  soft and non-tender.   Msk:  normal ROM, no joint tenderness, and no joint swelling.   Extremities:  No clubbing, cyanosis, edema, or deformity noted with normal full range of motion of all joints.   Neurologic:  alert & oriented X3, strength normal in all extremities, and gait normal.   Skin:  turgor normal, color normal, and no rashes.   Cervical Nodes:  well healed surgical scar with enlarged  freely movable tender lymphadenopathy Psych:  Oriented X3, memory intact for recent and remote, and normally interactive.     Impression & Recommendations:  Problem # 1:  ACUTE LYMPHADENITIS (ICD-683)  I have reviewed this case with my senior partner Dr. Maurice March. Both he and I agree that this is highly likely to be  sarcoidosis. I will proceed with prednisone rx in the am. The mycobacterium fortuitum is likely a colonizer and not a ture pathogen. It was isolated in 1/4 AFB cultures from sputum and not seen on two path exams of lymph nodes and not yet grwoing from LN bx cx.  Orders: Est. Patient Level V ([address])  Problem # 2:  PULMONARY NODULE (ICD-518.89)  Likely sarcoid but M fortuitum possible  Orders: Est. Patient Level V ([address]454)  Problem # 3:  PULMONARY DISEASES DUE TO OTHER MYCOBACTERIA (ICD-031.0)  Again I think this entire process is likley sarcoid rather than m. fortuitum. We will see how he does on steroids. If this is mycobacterial infection the steriods will worsen the situation and "shake the trees"I will send the species for S testing at [facility]  Orders: Est. Patient Level V ([address])  Problem # 4:  OTHER AND UNSPECIFIED MYCOSES (ICD-117.9)  the pseudoaschelarei is undoubtedly a contaminant  Orders: Est. Patient Level V (91478)  Problem # 5:  CHEST PAIN, PLEURITIC (ICD-786.52)  due to his pulmnary disease, which appears to  be sarcoid (vs less likely m fortuitum) His updated medication list for this problem includes:    Aspirin Ec 325 Mg Tbec (Aspirin) .Marland Kitchen... As needed    Ibuprofen 400 Mg Tabs (Ibuprofen) .Marland Kitchen... As needed  Orders: Est. Patient Level V [phone])  Other Orders: Influenza Vaccine NON MCR ([address]454)  Patient Instructions: 1)  I will talk my senior partner and Dr. Valentina Lucks at [facility] 2)  I think that you will steroids since this appears more c/w sarcoid 3)  however you did have one positive culture for mycobacterium fortuitum 4)  (ATS criteria for diagnosis with this infecition involves 2 or more positive cultures --whereas you only have one positive culture) 5)  Rtc to see Dr Daiva Eves in one month   Immunizations Administered:  Influenza Vaccine # 1:    Vaccine Type: Fluvax Non-MCR    Site: left deltoid    Mfr: novartis    Dose: 0.5 ml    Route: IM    Given by: Starleen Arms CMA    Exp. Date: 12/17/2010    Lot #: [id number]    VIS given: 04/11/10 version given August 29, 2010.  Flu Vaccine Consent Questions:    Do you have a history of severe allergic reactions to this vaccine? no    Any prior history of allergic reactions to egg and/or gelatin? no    Do you have a sensitivity to the preservative Thimersol? no    Do you have a past history of Guillan-Barre Syndrome? no    Do you currently have an acute febrile illness? no    Have you ever had a severe reaction to latex? no    Vaccine information given and explained to patient? yes  Appended Document: Orders Update    Clinical Lists Changes  Medications: Added new medication of PREDNISONE 20 MG TABS (PREDNISONE) take three tablets once a day for tweeks then two tablets once a day - Signed Rx of PREDNISONE 20 MG TABS (PREDNISONE) take three tablets once a day for tweeks then two tablets once a day;  #70 x 3;  Signed;  Entered by: Acey Lav MD;  Authorized by: Paulette Blanch Dam MD;  Method used: Electronically to [facility] [address] [place], [place], [place]  [address], Ph: [phone], Fax: [phone]    Prescriptions: PREDNISONE 20 MG TABS (PREDNISONE) take three tablets once a day for tweeks then two tablets once a day  #70 x 3   Entered and Authorized by:   Acey Lav MD   Signed by:   Paulette Blanch Dam MD on 08/30/2010   Method used:   Electronically to        [facility]* (retail)       [address]       [place], [place]  [address]       Ph: [phone]       Fax: [phone]   RxID:   [id number]

## 2010-10-19 NOTE — Letter (Signed)
Summary: EGD Instructions  [facility]  [address] [place], [place] [address]   Phone: [phone]  Fax: [phone]       Philip Hernandez    1956-01-17    MRN: [id number]       Procedure Day /Date:FRIDAY 10/06/2010     Arrival Time: 12:30PM     Procedure Time:1:30PM     Location of Procedure:                    X  [facility] (4th Floor) PREPARATION FOR ENDOSCOPY   On 10/06/2010  THE DAY OF THE PROCEDURE:  1.   No solid foods, milk or milk products are allowed after midnight the night before your procedure.  2.   Do not drink anything colored red or purple.  Avoid juices with pulp.  No orange juice.  3.  You may drink clear liquids until 10/04/2010 , which is 2 hours before your procedure.                                                                                                CLEAR LIQUIDS INCLUDE: Water Jello Ice Popsicles Tea (sugar ok, no milk/cream) Powdered fruit flavored drinks Coffee (sugar ok, no milk/cream) Gatorade Juice: apple, white grape, white cranberry  Lemonade Clear bullion, consomm, broth Carbonated beverages (any kind) Strained chicken noodle soup Hard Candy   MEDICATION INSTRUCTIONS  Unless otherwise instructed, you should take regular prescription medications with a small sip of water as early as possible the morning of your procedure             OTHER INSTRUCTIONS  You will need a responsible adult at least 54 years of age to accompany you and drive you home.   This person must remain in the waiting room during your procedure.  Wear loose fitting clothing that is easily removed.  Leave jewelry and other valuables at home.  However, you may wish to bring a book to read or an iPod/MP3 player to listen to music as you wait for your procedure to start.  Remove all body piercing jewelry and leave at home.  Total time from sign-in until discharge is approximately 2-3 hours.  You should go home directly  after your procedure and rest.  You can resume normal activities the day after your procedure.  The day of your procedure you should not:   Drive   Make legal decisions   Operate machinery   Drink alcohol   Return to work  You will receive specific instructions about eating, activities and medications before you leave.    The above instructions have been reviewed and explained to me by   _______________________    I fully understand and can verbalize these instructions _____________________________ Date _________

## 2010-10-19 NOTE — Progress Notes (Signed)
Summary: Appt sooner than first avail  Phone Note From Other Clinic   Caller: Debbie @ [facility] Office [phone] Call For: Dr Arlyce Dice Summary of Call: Would like pt seen sooner than first available on 10-25-10 for Abd Pain & pos hemastool cards Initial call taken by: Leanor Kail [facility],  September 20, 2010 2:13 PM  Follow-up for Phone Call        Left message to call back Follow-up by: Selinda Michaels RN,  September 20, 2010 3:04 PM  Additional Follow-up for Phone Call Additional follow up Details #1::        Appt made for patient on 09/28/10 @10am  with Dr. Arlyce Dice. Additional Follow-up by: Selinda Michaels RN,  September 20, 2010 4:06 PM

## 2010-10-19 NOTE — Letter (Signed)
Summary: Results Letter  [facility]  [address] [place], [place] [address]   Phone: [phone]  Fax: [phone]        October 12, 2010 MRN: [id number]    [facility] [address] [place], [place]  [address]    Dear Philip Hernandez,   Your biopsies demonstrated inflammatory changes only.    Please follow the recommendations previously discussed.  Should you have any immediate concerns or questions, feel free to contact me at the office.    Sincerely,  Barbette Hair. Arlyce Dice, M.D., [facility]          Sincerely,  Louis Meckel MD  This letter has been electronically signed by your physician.  Appended Document: Results Letter Letter is mailed to the patient's home address

## 2010-10-19 NOTE — Procedures (Addendum)
Summary: Upper Endoscopy  Patient: Philip Hernandez Note: All result statuses are Final unless otherwise noted.  Tests: (1) Upper Endoscopy (EGD)   EGD Upper Endoscopy       DONE     [facility]     [address]     [place], [place]  [address]           ENDOSCOPY PROCEDURE REPORT           PATIENT:  Philip Hernandez, Philip Hernandez  MR#:  [id number]     BIRTHDATE:  1955-12-07, 54 yrs. old  GENDER:  male           ENDOSCOPIST:  Jatniel Verastegui. Arlyce Dice, MD     Referred by:  Rudi Heap, M.D.           PROCEDURE DATE:  10/06/2010     PROCEDURE:  EGD with biopsy, [address]     ASA CLASS:  Class II     INDICATIONS:  hemoccult positive stool           MEDICATIONS:   Fentanyl 50 mcg IV, Versed 5 mg IV, glycopyrrolate     (Robinal) 0.2 mg IV, 0.6cc simethancone 0.6 cc PO     TOPICAL ANESTHETIC:  Exactacain Spray           DESCRIPTION OF PROCEDURE:   After the risks benefits and     alternatives of the procedure were thoroughly explained, informed     consent was obtained.  The LB [id number] endoscope was     introduced through the mouth and advanced to the third portion of     the duodenum, without limitations.  The instrument was slowly     withdrawn as the mucosa was fully examined.     <<PROCEDUREIMAGES>>           irregular Z-line at the gastroesophageal junction. 2 areas of     gastric appearing mucosa extending 1cm proximal to GE junction.     Multiple biopsies were obtained and sent to pathology (see image10     and image8).  Otherwise the examination was normal (see image2,     image3, image4, image5, image6, and image11).    Retroflexed views     revealed no abnormalities.    The scope was then withdrawn from     the patient and the procedure completed.           COMPLICATIONS:  None           ENDOSCOPIC IMPRESSION:     1) Irregular Z-line at the gastroesophageal junction     2) Otherwise normal examination           No source for heme positive stool identified        RECOMMENDATIONS:     1) hemmoccult stools in 1 weeks           REPEAT EXAM:   You will receive a letter from Dr. Arlyce Dice in 1-2     weeks, after reviewing the final pathology, with followup     recommendations..           ______________________________     Barbette Hair Arlyce Dice, MD           CC:           n.     eSIGNED:   Barbette Hair. Kaplan at 10/06/2010 01:56 PM           Crissie Reese, [phone]  Note: An exclamation mark (!) indicates  a result that was not dispersed into the flowsheet. Document Creation Date: 10/06/2010 1:56 PM _______________________________________________________________________  (1) Order result status: Final Collection or observation date-time: 10/06/2010 13:51 Requested date-time:  Receipt date-time:  Reported date-time:  Referring Physician:   Ordering Physician: Melvia Heaps [phone]) Specimen Source:  Source: Launa Grill Order Number: [id number] Lab site:

## 2010-10-19 NOTE — Assessment & Plan Note (Signed)
Summary: Abdominal pain/+stool cards/[facility]   History of Present Illness Visit Type: Follow-up Visit Primary GI MD: Melvia Heaps MD [facility] Primary Provider: Rudi Heap, MD  Requesting Provider: na Chief Complaint: Lower abd pain, and heme positive stool cards  History of Present Illness:   [name] is a 55 year old white male referred at the request of Dr. Christell Constant for evaluation of lower abdominal pain and Hemoccult-positive stools.  Family history is pertinent for his mother and brother who developed colon cancer in their 72s.  Philip Hernandez has undergone periodic colonoscopy.  Last colonoscopy in 2010 demonstrated a sigmoid tubulovillous adenoma.  For the past 2-3 weeks he has noted moderate crampy lower bowel pain  bilaterally.  On exam at Dr. Kathi Der office he tested Hemoccult positive.  He denies rectal bleeding or melena.  He was recently placed on prednisone because of sarcoidosis and is also taking omeprazole.   GI Review of Systems    Reports abdominal pain.      Denies acid reflux, belching, bloating, chest pain, dysphagia with liquids, dysphagia with solids, heartburn, loss of appetite, nausea, vomiting, vomiting blood, weight loss, and  weight gain.        Denies anal fissure, black tarry stools, change in bowel habit, constipation, diarrhea, diverticulosis, fecal incontinence, heme positive stool, hemorrhoids, irritable bowel syndrome, jaundice, light color stool, liver problems, rectal bleeding, and  rectal pain.    Current Medications (verified): 1)  Aspirin Ec 325 Mg Tbec (Aspirin) .... As Needed 2)  Ibuprofen 400 Mg Tabs (Ibuprofen) .... As Needed 3)  Claritin 10 Mg Tabs (Loratadine) .... As Needed 4)  Prednisone 20 Mg Tabs (Prednisone) .... One Tablet By Mouth Once Daily 5)  Vitamin D 1000 Unit Tabs (Cholecalciferol) .... One Tablet By Mouth Once Daily 6)  Omeprazole 40 Mg Cpdr (Omeprazole) .... One Tablet By Mouth Once Daily  Allergies (verified): No Known Drug  Allergies  Past History:  Past Medical History: SARCOIDOSIS (ICD-135) GERD (ICD-530.81) ADENOCARCINOMA, PROSTATE (ICD-185) CHEST PAIN, PLEURITIC (ICD-786.52) OTHER AND UNSPECIFIED MYCOSES (ICD-117.9) PULMONARY DISEASES DUE TO OTHER MYCOBACTERIA (ICD-031.0) PULMONARY NODULE (ICD-518.89) COUGH (ICD-786.2) ACUTE LYMPHADENITIS (ICD-683) NECK MASS (ICD-784.2) DYSPNEA (ICD-786.05) CHEST PAIN (ICD-786.50)  Past Surgical History:  Open resection distal right clavicle.   Right shoulder surgery LN excisional biopsy by Dr. Arn Medal Hernia repair 13 yrs ago Prostate Surgery   Family History:  remarkable for colon cancer.  aunt with head and neck cancer  Social History: Reviewed history from 07/05/2009 and no changes required. Married Works at [employer] Non-smoker Non-drinker Primary is Philip Hernandez  Review of Systems       The patient complains of back pain.  The patient denies allergy/sinus, anemia, anxiety-new, arthritis/joint pain, blood in urine, breast changes/lumps, change in vision, confusion, cough, coughing up blood, depression-new, fainting, fatigue, fever, headaches-new, hearing problems, heart murmur, heart rhythm changes, itching, menstrual pain, muscle pains/cramps, night sweats, nosebleeds, pregnancy symptoms, shortness of breath, skin rash, sleeping problems, sore throat, swelling of feet/legs, swollen lymph glands, thirst - excessive , urination - excessive , urination changes/pain, urine leakage, vision changes, and voice change.         All other systems were reviewed and were negative   Vital Signs:  Patient profile:   55 year old male Height:      70 inches Weight:      180 pounds BMI:     25.92 BSA:     2.00 Pulse rate:   76 / minute Pulse rhythm:   regular BP  sitting:   128 / 74  (left arm) Cuff size:   regular  Vitals Entered By: Ok Anis CMA (September 28, 2010 10:36 AM)  Physical Exam  Additional Exam:  N. physical exam is well-developed  well-nourished male  skin: anicteric HEENT: normocephalic; PEERLA; no nasal or pharyngeal abnormalities neck: supple nodes: no cervical lymphadenopathy chest: clear to ausculatation and percussion heart: no murmurs, gallops, or rubs abd: soft, nontender; BS normoactive; no abdominal masses, tenderness, organomegaly rectal: deferred ext: no cynanosis, clubbing, edema skeletal: no deformities neuro: oriented x 3; no focal abnormalities    Impression & Recommendations:  Problem # 1:  NONSPECIFIC ABNORMAL FINDING IN STOOL CONTENTS (ICD-792.1)  Patient has evidence for occult GI bleeding.  A colon source is less likely in view of his relatively recent colonoscopy.  He is taking steroids and certainly could be bleeding from his upper GI tract.  Recommendations #1 upper endoscopy #2 if endoscopy is unrevealing, I would consider repeat colonoscopy if he is persistently Hemoccult positive.  Orders: EGD (EGD)  Problem # 2:  ADENOCARCINOMA, COLON, FAMILY HX (ICD-V16.0) Mother and brother both had colon cancer in their 65s.  Patient has a history of a tubulovillous adenoma.  The patient continue close surveillance with colonoscopy every 5 years  Problem # 3:  SARCOIDOSIS (ICD-135) Assessment: Comment Only  Problem # 4:  ADENOCARCINOMA, PROSTATE (ICD-185) Assessment: Comment Only  Patient Instructions: 1)  Copy sent to : Rudi Heap, MD  2)  Your EGD is scheduled on 10/06/2010 at 1:30pm 3)  Conscious Sedation brochure given.  4)  Upper Endoscopy brochure given.  5)  The medication list was reviewed and reconciled.  All changed / newly prescribed medications were explained.  A complete medication list was provided to the patient / caregiver. Prescriptions: HYOMAX-SL 0.125 MG SUBL (HYOSCYAMINE SULFATE) take 2 tablets sublingual q.4 h. p.r.n.  #15 x 1   Entered and Authorized by:   Louis Meckel MD   Signed by:   Louis Meckel MD on 09/29/2010   Method used:   Electronically to         [facility]* (retail)       [address]       [place], [place]  [address]       Ph: [phone]       Fax: [phone]   RxID:   [id number]

## 2010-10-19 NOTE — Consult Note (Signed)
Summary: [facility]   Imported By: Florinda Marker 08/28/2010 10:27:13  _____________________________________________________________________  External Attachment:    Type:   Image     Comment:   External Document

## 2010-10-20 NOTE — Consult Note (Signed)
Summary: [facility]   Imported By: Florinda Marker 08/17/2010 09:31:16  _____________________________________________________________________  External Attachment:    Type:   Image     Comment:   External Document

## 2010-10-23 ENCOUNTER — Institutional Professional Consult (permissible substitution) (INDEPENDENT_AMBULATORY_CARE_PROVIDER_SITE_OTHER): Payer: BC Managed Care – PPO | Admitting: Internal Medicine

## 2010-10-23 ENCOUNTER — Encounter: Payer: Self-pay | Admitting: Internal Medicine

## 2010-10-23 DIAGNOSIS — R059 Cough, unspecified: Secondary | ICD-10-CM

## 2010-10-23 DIAGNOSIS — R05 Cough: Secondary | ICD-10-CM

## 2010-10-23 DIAGNOSIS — J018 Other acute sinusitis: Secondary | ICD-10-CM

## 2010-10-23 DIAGNOSIS — D869 Sarcoidosis, unspecified: Secondary | ICD-10-CM

## 2010-10-26 ENCOUNTER — Other Ambulatory Visit: Payer: Self-pay | Admitting: Gastroenterology

## 2010-10-26 ENCOUNTER — Encounter: Payer: Self-pay | Admitting: Gastroenterology

## 2010-10-26 ENCOUNTER — Encounter (INDEPENDENT_AMBULATORY_CARE_PROVIDER_SITE_OTHER): Payer: Self-pay | Admitting: *Deleted

## 2010-10-26 ENCOUNTER — Other Ambulatory Visit: Payer: BC Managed Care – PPO

## 2010-10-26 DIAGNOSIS — Z0000 Encounter for general adult medical examination without abnormal findings: Secondary | ICD-10-CM

## 2010-10-26 LAB — HEMOCCULT SLIDES (X 3 CARDS)
Fecal Occult Blood: NEGATIVE
OCCULT 1: NEGATIVE
OCCULT 2: NEGATIVE
OCCULT 3: NEGATIVE
OCCULT 4: NEGATIVE
OCCULT 5: NEGATIVE

## 2010-10-31 ENCOUNTER — Telehealth: Payer: Self-pay | Admitting: Gastroenterology

## 2010-11-02 NOTE — Assessment & Plan Note (Signed)
Summary: SARCOIDOSIS/LED  REF BY DR Zenaida Niece DAM   Visit Type:  Initial Consult Copy to:  Dr. Paulette Blanch Dam Primary Provider/Referring Provider:  Rudi Heap, MD , Dr [name]- ENT  CC:  Pulmonary Consult for Sarcoidosis. Philip Hernandez  History of Present Illness: November 11, 4061: 55 year old while male wiht hx of prostate cancer and travel hx to [place] in 2005.Philip Hernandez Reports chronic cough since Feb 2011. PErsistent.  Late summer 2011/early fall 2011 patient noted swollen cervical nodes. Went to ENT Dr [name]- bx showed "bacteria" (this report is not avail in echart). Saw Dr. Synthia Innocent. Pan CT body11/09/2009  showed cervical nodes, and pulmonary infiltrates, calcified and non-calcified mediastinal nodes along with possible splenic granuloma, hepatic granuloma v hemangioma. Underwent multiple blood and sputum tests reportedly and this was all normal (see past med hx) Re-referred to ENT for repeat biopsy on 08/09/2010 which per echart showed non-necrotizing granuloma (PCR test was negative for AFB). Subsequently, patient was told diagnosis was sarcoidosis. STarted on prednisone 60mg  per day. With this cough significantly improved. Subsequently develiped abdominal pain iwht some "blood in stools".STarted on prilosec in early Jan 2012 and prednisone cut down to 20mg  per day but now cough is back.  Underwent EGD 10/06/2010 that showed GERD related inflammation but no ulcer disease (per chart review).  Main current sypmtoms are cough and dyspnea. Cough improved iwht prednisone 60mg  per day but increased with current dose of prednisone 20mg  per day. Mostly dry. Occ white mucus that is thick but small amounts. Currently having cold with yellow nasal drainage. Also admits to chronic post nasal sinus drainage. Has hx of chronic prn  heartburn that appears improved after prilosec in past few weeks. Dyspnea and fatigue also improved with higher dose of steroid but now worse again after reduction in prednisone dose. Becomes dyspneic with  "any work", "any activity" such as climbing flight ot steps or walk a block or so.  but not for change in clothes.     Preventive Screening-Counseling & Management  Alcohol-Tobacco     Alcohol drinks/day: 0     Smoking Status: never  Current Medications (verified): 1)  Prednisone 20 Mg Tabs (Prednisone) .... One Tablet By Mouth Once Daily 2)  Vitamin D 1000 Unit Tabs (Cholecalciferol) .... One Tablet By Mouth Once Daily 3)  Omeprazole 40 Mg Cpdr (Omeprazole) .... One Tablet By Mouth Once Daily 4)  Hyomax-Sl 0.125 Mg Subl (Hyoscyamine Sulfate) .... Take 2 Tablets Sublingual Q.4 H. P.r.n.  Allergies (verified): No Known Drug Allergies  Past History:  Past Medical History: #SARCOIDOSIS (ICD-135).Philip KitchenMarland KitchenDr Mallery Harshman/Dr Lynnae Sandhoff - Presents wit cough, dyspnea, fatigue since feb 2011  - CT 07/18/2010 - involves pulmonary UL parenchyma, mediastina and cervical nodes, hepatic and splenic granuloma as well  -07/10/2010 - ESR 14, ACE 42, CMV abCrypto age, Ur Histo Ag, Blasto, Toxi, EBV - ALL neg. Angela Burke - [facility]  - 07/19/2010 - Cocci Ab - negative  - 07/13/2010 - sputum x1 Myco mucogenium. 2nd sputum Pseudallachria Boydii, 3rd sputum - neg. Deemed contramintant by ID - 08/09/2010 - neck node bx - non-nec granuloma. PCR for AFB negative  #GERD (ICD-530.81).Philip KitchenMarland KitchenDr Arlyce Dice  - bx proven Jan 2012  #ADENOCARCINOMA, PROSTATE (ICD-185)..........Philip KitchenDr Heloise Purpura  - s/p prostate removal dec 2010  - hx of normal PSA since then as of Jan 2012 CHEST PAIN, PLEURITIC (ICD-786.52) OTHER AND UNSPECIFIED MYCOSES (ICD-117.9) #COUGH (ICD-786.2) #NECK MASS (ICD-784.2)  Family History:  remarkable for colon cancer.   aunt with head and  neck cancer  Social History: Married Works at [employer] Non-smoker Non-drinker Primary is [place] travel hx: [employer] 4 days, 2005. [place].   Review of Systems       The patient complains of shortness of breath with  activity, shortness of breath at rest, productive cough, acid heartburn, indigestion, abdominal pain, sore throat, nasal congestion/difficulty breathing through nose, rash, and change in color of mucus.  The patient denies non-productive cough, coughing up blood, chest pain, irregular heartbeats, loss of appetite, weight change, difficulty swallowing, tooth/dental problems, headaches, sneezing, itching, ear ache, anxiety, depression, hand/feet swelling, joint stiffness or pain, and fever.    Vital Signs:  Patient profile:   55 year old male Height:      70 inches Weight:      184.50 pounds BMI:     26.57 O2 Sat:      98 % on Room air Temp:     98.7 degrees F oral Pulse rate:   86 / minute BP sitting:   130 / 90  (right arm) Cuff size:   regular  Vitals Entered By: Carron Curie CMA (October 23, 2010 9:23 AM)  O2 Flow:  Room air CC: Pulmonary Consult for Sarcoidosis.  Comments Medications reviewed with patient Carron Curie CMA  October 23, 2010 9:30 AM Daytime phone number verified with patient.    Physical Exam  General:  well developed, well nourished, in no acute distress Head:  normocephalic and atraumatic Eyes:  PERRLA/EOM intact; conjunctiva and sclera clear Ears:  TMs intact and clear with normal canals Nose:  no deformity, discharge, inflammation, or lesions Mouth:  no deformity or lesions Neck:  no masses, thyromegaly, or abnormal cervical nodes Chest Wall:  no deformities noted Lungs:  clear bilaterally to auscultation and percussion coughs periodically  Heart:  regular rate and rhythm, S1, S2 without murmurs, rubs, gallops, or clicks Abdomen:  bowel sounds positive; abdomen soft and non-tender without masses, or organomegaly Msk:  no deformity or scoliosis noted with normal posture Pulses:  pulses normal Extremities:  no clubbing, cyanosis, edema, or deformity noted Neurologic:  CN II-XII grossly intact with normal reflexes, coordination, muscle strength  and tone Skin:  intact without lesions or rashes Cervical Nodes:  no significant adenopathy Axillary Nodes:  no significant adenopathy Psych:  alert and cooperative; normal mood and affect; normal attention span and concentration   CT of Chest  Procedure date:  07/18/2010  Findings:       Comparison: 08/22/2009 chest radiograph    Findings: Bilateral interstitial and micronodular opacities   (primarily upper lobes) are identified in a perivascular   distribution.  With scattered calcified mediastinal and hilar lymph   nodes, these pulmonary findings are compatible with sarcoid.   The spleen is mildly enlarged with multiple small hypodense   lesion/granulomas compatible with sarcoid as well.    The heart and great vessels are within normal limits.   There are no pleural or pericardial effusions present.    There is no evidence of focal airspace disease or consolidation.    Mildly enlarged upper abdominal lymph nodes would be compatible   with a diagnosis of sarcoid as well.    Multiple hepatic hypodense lesions, some which are cysts and others   possibly granulomas/hemangiomas are also noted.    IMPRESSION:   Bilateral interstitial and micronodular opacities (primarily upper   lobes), calcified mediastinal/hilar lymph nodes and multiple small   hypodense  splenic lesions/granulomas - compatible with sarcoid.    The cervical lymphadenopathy identified on today's neck CT are   likely associated with sarcoid, but as these are more prominent   than typically seen with sarcoid, consider CT follow-up in 3-6   months or sampling, as clinically indicated.    Read By:  Rosendo Gros,  M.D.   Released By:  Rosendo Gros,  M.D.   Comments:      independently reviewed  MISC. Report  Procedure date:  08/09/2010  Findings:        Received Date: 08/09/2010    FINAL DIAGNOSIS    1. Soft tissue mass, simple excision, Right Neck Mass :   - NON-NECROTIZING GRANULOMATOUS  INFLAMMATION; SEE COMMENT.   - NO ATYPIA OR MALIGNANCY PRESENT.    2. Soft tissue mass, simple excision, Right Neck Mass :   - NON-NECROTIZING GRANULOMATOUS INFLAMMATION; SEE COMMENT.   - NO ATYPIA OR MALIGNANCY PRESENT.    CORRECTED   [phone]: PCR analysis.    ELECTRONIC SIGNATURE : [place], [place], Sports administrator, International aid/development worker    MICROSCOPIC DESCRIPTION   1. and 2.There are abundant non-necrotizing epithelioid granulomata that   coalesce into sheets.Accompanying the granulomata there is a mixed lymphocytic   population, suggestive of lymph node involvement.AFB special stain is negative   for mycobacterium.Differential considerations include sarcoidosis or, if there   is a history of exposure, berylliosis.Although an infectious process is not   favored, per the request infectious disease, a sample is submitted for PCR to   exclude mycobacterium.CR/eps 08/11/10.   ADDENDUM: PCR analysis for mycobacterium tuberculosis complex was NEGATIVE (see   [facility] report #[id number]).  Comments:      rt neck node biopsys  Impression & Recommendations:  Problem # 1:  COUGH (ICD-786.2) Assessment New I htink his cough is due to acute on chronic sinus issues, GERD with poor dietary control and Pulmonary Sarcoid. He gives hx that cough improves with prednisone dose of 30mg  - 60mg  per day but has relapsed iwth lower dose of 20mg  per day. However, raising prednisone dose at this stage will put hm at risk for significant long term steroid complications. BEst to addess other 2 etiologis of cough - sinus and GERD.  I spoke at lenght about this and a) Rx acute sinjsitis with doxy; b) chronic sinus with nasonex and netti pot; c) emphasied diet control for gerd and d) change to zegerid when prilosec course completed. This will then help sort out how much of cough is from sarcoid Orders: Pulmonary Referral (Pulmonary) Consultation Level V (86578)  Problem # 2:  OTHER ACUTE  SINUSITIS (ICD-461.8) Assessment: New  His updated medication list for this problem includes:    Doxycycline Monohydrate 100 Mg Caps (Doxycycline monohydrate) ..... By mouth twice daily after meals    Fluticasone Propionate 50 Mcg/act Susp (Fluticasone propionate) .Philip Hernandez... 2 sprays into each nostrile dailly  Orders: Consultation Level V (46962)  Problem # 3:  SARCOIDOSIS (ICD-135) Assessment: New Stage2 pulm sarcoid with hepatic and splenic involvmement. AGree with Dr. Zenaida Niece Damm's diagnosis. No need to bronch   plan continue prednisone 20mg  per day for now pft at followup decide on increasing pred dose depending on cough control using anti-sinus and anti-gerd measures  Medications Added to Medication List This Visit: 1)  Zegerid Otc 20-1100 Mg Caps (Omeprazole-sodium bicarbonate) .... Take 1 capsule daily 2)  Doxycycline Monohydrate 100 Mg Caps (Doxycycline monohydrate) .... By mouth twice daily after meals 3)  Fluticasone Propionate 50 Mcg/act Susp (Fluticasone propionate) .... 2 sprays into each nostrile dailly  Patient Instructions: 1)  #SARCOID 2)   - continue prednsione 20mg  per day 3)  #SINUS DRAINAGE 4)   - take doxycycyline 100mg  by mouth two times a day x 7 days 5)   - take it after meals 6)  - start nasal steroid 2 puff daily (take sample, learn technique) 7)   - netti pot saline wash daily (get picture of this) 8)  #GERD 9)   - continue prilosec 20mg  per day on empty stomach 10)  - switch to zegerid OTC 20mg  on empty stomach when above over 11)   - take diet sheet 12)  #COUGH 13)    - is due to all of above 14)   - contril sinus and gerd 15)  #RETRUN 16)   - 6  weeks with full PFT Prescriptions: FLUTICASONE PROPIONATE 50 MCG/ACT SUSP (FLUTICASONE PROPIONATE) 2 sprays into each nostrile dailly  #1 x 6   Entered and Authorized by:   Kalman Shan MD   Signed by:   Kalman Shan MD on 10/23/2010   Method used:   Electronically to        [facility] (retail)       [address]       [place], [place]  [address]       Ph: [phone]       Fax: [phone]   RxID:   [id number] DOXYCYCLINE MONOHYDRATE 100 MG  CAPS (DOXYCYCLINE MONOHYDRATE) By mouth twice daily after meals  #14 x 0   Entered and Authorized by:   Kalman Shan MD   Signed by:   Kalman Shan MD on 10/23/2010   Method used:   Electronically to        [facility]* (retail)       [address]       [place], [place]  [address]       Ph: [phone]       Fax: [phone]   RxID:   [id number] ZEGERID OTC 20-1100 MG CAPS (OMEPRAZOLE-SODIUM BICARBONATE) take 1 capsule daily  #30 x 2   Entered and Authorized by:   Kalman Shan MD   Signed by:   Kalman Shan MD on 10/23/2010   Method used:   Electronically to        [facility]* (retail)       [address]       [place], [place]  [address]       Ph: [phone]       Fax: [phone]   RxID:   [id number]

## 2010-11-02 NOTE — Letter (Signed)
Summary: Results Letter  [facility]  [address] [place], [place] [address]   Phone: [phone]  Fax: [phone]        October 26, 2010 MRN: [id number]    [facility] [address] [place], [place]  [address]    Dear Mr. Uballe,  Your hemeoccult cards testing for microscopic bleeding were negative.  No further GI workup is required at this time.   Please continue with the recommendations that we previously discussed. Should you have any further questions or concerns, feel free to contact me.    Sincerely,  Barbette Hair. Arlyce Dice, M.D., [facility]          Sincerely,  Louis Meckel MD  This letter has been electronically signed by your physician.  Appended Document: Results Letter Letter is mailed to the patient's home address

## 2010-11-08 NOTE — Progress Notes (Signed)
Summary: Results  Phone Note Call from Patient Call back at Work Phone [phone]   Caller: Patient Call For: Dr. Arlyce Dice Reason for Call: Talk to Nurse Summary of Call: Pt is calling for biopsy results from EGD Initial call taken by: [place] Johnson,  October 31, 2010 4:30 PM  Follow-up for Phone Call        Left message to call back Follow-up by: Selinda Michaels RN,  October 31, 2010 4:44 PM  Additional Follow-up for Phone Call Additional follow up Details #1::        Patient notified of path report. Letter mailed to home. Additional Follow-up by: Selinda Michaels RN,  October 31, 2010 4:51 PM

## 2010-11-23 ENCOUNTER — Encounter: Payer: Self-pay | Admitting: Licensed Clinical Social Worker

## 2010-11-28 ENCOUNTER — Encounter: Payer: Self-pay | Admitting: *Deleted

## 2010-11-28 LAB — CULTURE, ROUTINE-ABSCESS

## 2010-11-28 LAB — BASIC METABOLIC PANEL
CO2: 29 mEq/L (ref 19–32)
Chloride: 103 mEq/L (ref 96–112)
GFR calc Af Amer: >60 mL/min (ref >60)
Potassium: 5 mEq/L (ref 3.5–5.1)
Sodium: 139 mEq/L (ref 135–145)

## 2010-11-28 LAB — CBC
HCT: 39.3 % (ref 39.0–52.0)
Hemoglobin: 13.1 g/dL (ref 13.0–17.0)
MCH: 27.8 pg (ref 26.0–34.0)
MCV: 83.4 fL (ref 78.0–100.0)
Platelets: 163 10*3/uL (ref 150–400)
RBC: 4.71 MIL/uL (ref 4.22–5.81)
WBC: 7.5 10*3/uL (ref 4.0–10.5)

## 2010-11-28 LAB — ANAEROBIC CULTURE

## 2010-11-28 LAB — FUNGUS CULTURE W SMEAR

## 2010-11-28 LAB — AFB CULTURE WITH SMEAR (NOT AT ARMC): Acid Fast Smear: NONE SEEN

## 2010-12-04 ENCOUNTER — Encounter (INDEPENDENT_AMBULATORY_CARE_PROVIDER_SITE_OTHER): Payer: BC Managed Care – PPO

## 2010-12-04 ENCOUNTER — Encounter: Payer: Self-pay | Admitting: Internal Medicine

## 2010-12-04 DIAGNOSIS — R059 Cough, unspecified: Secondary | ICD-10-CM

## 2010-12-04 DIAGNOSIS — D869 Sarcoidosis, unspecified: Secondary | ICD-10-CM

## 2010-12-04 DIAGNOSIS — R0602 Shortness of breath: Secondary | ICD-10-CM

## 2010-12-04 DIAGNOSIS — R05 Cough: Secondary | ICD-10-CM

## 2010-12-04 LAB — PULMONARY FUNCTION TEST

## 2010-12-08 ENCOUNTER — Telehealth: Payer: Self-pay | Admitting: Internal Medicine

## 2010-12-08 NOTE — Telephone Encounter (Signed)
Philip Hernandez, needs to see me after pfts. pls give appt first avail

## 2010-12-14 NOTE — Assessment & Plan Note (Signed)
Summary: pft charges   Allergies: No Known Drug Allergies   Other Orders: Carbon Monoxide diffusing w/capacity (94729) Lung Volumes/Gas dilution or washout (94727) Spirometry (Pre & Post) (94060) 

## 2010-12-14 NOTE — Telephone Encounter (Signed)
Pt set to see MR on 12-18-10 at 4:15. Carron Curie, CMA

## 2010-12-15 ENCOUNTER — Encounter: Payer: Self-pay | Admitting: *Deleted

## 2010-12-18 ENCOUNTER — Encounter: Payer: Self-pay | Admitting: Internal Medicine

## 2010-12-18 ENCOUNTER — Ambulatory Visit (INDEPENDENT_AMBULATORY_CARE_PROVIDER_SITE_OTHER): Payer: BC Managed Care – PPO | Admitting: Internal Medicine

## 2010-12-18 VITALS — BP 140/84 | HR 85 | Temp 98.3°F | Ht 70.0 in | Wt 184.5 lb

## 2010-12-18 DIAGNOSIS — R05 Cough: Secondary | ICD-10-CM

## 2010-12-18 DIAGNOSIS — K219 Gastro-esophageal reflux disease without esophagitis: Secondary | ICD-10-CM

## 2010-12-18 DIAGNOSIS — D869 Sarcoidosis, unspecified: Secondary | ICD-10-CM

## 2010-12-18 DIAGNOSIS — D860 Sarcoidosis of lung: Secondary | ICD-10-CM

## 2010-12-18 DIAGNOSIS — J99 Respiratory disorders in diseases classified elsewhere: Secondary | ICD-10-CM

## 2010-12-18 DIAGNOSIS — R0602 Shortness of breath: Secondary | ICD-10-CM

## 2010-12-18 DIAGNOSIS — R059 Cough, unspecified: Secondary | ICD-10-CM

## 2010-12-18 MED ORDER — ESOMEPRAZOLE MAGNESIUM 20 MG PO CPDR: 20.0000 mg | DELAYED_RELEASE_CAPSULE | Freq: Every day | ORAL | Status: AC

## 2010-12-18 NOTE — Progress Notes (Signed)
  Subjective:    Patient ID: Philip Hernandez, male    DOB: 07-Nov-1955, 55 y.o.   MRN: [id number]  HPI Followup Stage 2 Pulmonary sarcoid (Also neck nodes, splenic granuloma), cough (due to sarcoid, sinus, and gerd) and class 2-3 dyspnea (due to sarcoid and deconditioning). At last office visit we deemed that sinus and gerd were more important contributors to cough than sarcoid. With active sinus and gerd control, cough is nearly gone. HE is compliant with instructions but he and wife are frustrated with rigidity of diet and want to splurge. Dyspnea persists unchanged and he wants this improved. Taking prednisone 20mg  per day. No side effcts. PFTs 12/04/2010 show mild obstruction on spiro (fev1 2.8L/76%, ratioo 69, normal DLCO but restriction on lung volume - TLC 79%).   Review of Systems  Constitutional: Negative.   HENT: Negative.   Eyes: Negative.   Respiratory: Positive for shortness of breath. Negative for apnea, cough, choking, chest tightness, wheezing and stridor.   Cardiovascular: Negative.   Gastrointestinal: Negative.   Genitourinary: Negative.   Musculoskeletal: Negative.   Skin: Negative.   Neurological: Negative.   Hematological: Negative.   Psychiatric/Behavioral: Negative.        Objective:   Physical Exam  [nursing notereviewed. Constitutional: He is oriented to person, place, and time. He appears well-developed and well-nourished. No distress.  HENT:  Head: Normocephalic and atraumatic.  Right Ear: External ear normal.  Left Ear: External ear normal.  Mouth/Throat: Oropharynx is clear and moist. No oropharyngeal exudate.  Eyes: Conjunctivae and EOM are normal. Pupils are equal, round, and reactive to light. Right eye exhibits no discharge. Left eye exhibits no discharge. No scleral icterus.  Neck: Normal range of motion. Neck supple. No JVD present. No tracheal deviation present. No thyromegaly present.  Cardiovascular: Normal rate, regular rhythm and intact distal  pulses.  Exam reveals no gallop and no friction rub.   No murmur heard. Pulmonary/Chest: Effort normal and breath sounds normal. No respiratory distress. He has no wheezes. He has no rales. He exhibits no tenderness.  Abdominal: Soft. Bowel sounds are normal. He exhibits no distension and no mass. There is no tenderness. There is no rebound and no guarding.  Musculoskeletal: Normal range of motion. He exhibits no edema and no tenderness.  Lymphadenopathy:    He has no cervical adenopathy.  Neurological: He is alert and oriented to person, place, and time. He has normal reflexes. No cranial nerve deficit. Coordination normal.  Skin: Skin is warm and dry. No rash noted. He is not diaphoretic. No erythema. No pallor.  Psychiatric: He has a normal mood and affect. His behavior is normal. Judgment and thought content normal.          Assessment & Plan:

## 2010-12-18 NOTE — Assessment & Plan Note (Signed)
Have explained that sarcoid was not the only cause for cough. Sinus and GERD are active issues contributing to cough. Glad control of sinus and gerd issues have helped nearly resolve cough. He is compliant but desperate to violate anti-gerd diet. He is missing his OJ, pizzas. Extensively counselled on the benefits of a healthy diet. Advised to adhere to diet. Counselled his wife (she is finding it hard to cook healthy food for him) on the rationale.  15 min of this visit spent face to face counselling on this section. Continue PPI (will give nexium Rx to see if it is cheaper) and nasal steroids and netti pot

## 2010-12-18 NOTE — Assessment & Plan Note (Signed)
Stable disease. Will reduce prednisone to 10mg  per day. ROV in 2 months to assess response to this taper. Educated that he needs prednisone for 1-2 years and on natural hx of sarcoid and long term side effects of prednisone  10 min counseling on this section

## 2010-12-18 NOTE — Assessment & Plan Note (Addendum)
This still persists class 2-3 levels and unchanged. Will refer to pulmonary rehab  5 min counselling face to face

## 2010-12-18 NOTE — Patient Instructions (Signed)
#  GERD  Acid reflux  - try nexium instead of zegerid  - follow diet sheet strictly #SINUS  - continue netti pot  - continue nasal steroid  #SARCOID  - cut prednisone down to 10mg  per day #SHORTNESS OF BREATH  - [facility] #FOLLOWUP  - 2 months or sooner if needed

## 2010-12-19 LAB — BASIC METABOLIC PANEL
BUN: 13 mg/dL (ref 6–23)
GFR calc Af Amer: >60 mL/min (ref >60)
GFR calc non Af Amer: >60 mL/min (ref >60)
Potassium: 4.8 mEq/L (ref 3.5–5.1)
Sodium: 139 mEq/L (ref 135–145)

## 2010-12-19 LAB — HEMOGLOBIN AND HEMATOCRIT, BLOOD
Hemoglobin: 11.6 g/dL — ABNORMAL LOW (ref 13.0–17.0)
Hemoglobin: 13.8 g/dL (ref 13.0–17.0)

## 2010-12-19 LAB — TYPE AND SCREEN

## 2010-12-19 LAB — ABO/RH: ABO/RH(D): O POS

## 2010-12-19 LAB — CBC
HCT: 41.1 % (ref 39.0–52.0)
Hemoglobin: 14 g/dL (ref 13.0–17.0)
Platelets: 166 10*3/uL (ref 150–400)
RBC: 4.84 MIL/uL (ref 4.22–5.81)
WBC: 6.2 10*3/uL (ref 4.0–10.5)

## 2010-12-20 ENCOUNTER — Encounter: Payer: Self-pay | Admitting: Internal Medicine

## 2011-01-08 ENCOUNTER — Telehealth: Payer: Self-pay | Admitting: Internal Medicine

## 2011-01-08 NOTE — Telephone Encounter (Signed)
Spoke w/ Shanda Bumps over at [facility] and she states their is a 1 month wait period currently. She could not inform me when pt would be called about pulm rehab. LMOMTCB X1 for pt to advise him of this

## 2011-01-09 NOTE — Telephone Encounter (Signed)
lmomtcb x1 

## 2011-01-10 NOTE — Telephone Encounter (Signed)
lmomtcb x 3  

## 2011-01-11 NOTE — Telephone Encounter (Signed)
lmomtcb x4. Advised pt if he needs [place] for anything further to call us back and we would be more than happy to help him. Will sign off message

## 2011-01-12 ENCOUNTER — Telehealth: Payer: Self-pay | Admitting: Internal Medicine

## 2011-01-12 NOTE — Telephone Encounter (Signed)
Spoke w/ Shanda Bumps over at [facility] and she states their is a 1 month wait period currently. She could not inform me when pt would be called about pulm rehab. LMOMTCB X1 for pt to advise him of this  The above is what pt needs to be informed of. He had called on 4/24 regarding the status of [facility] and we had tried calling him back with no response. LMTCB

## 2011-01-15 NOTE — Telephone Encounter (Signed)
Called, spoke with pt.  He was informed of below statement and verbalized understanding of this.  States Philip Hernandez So called him last week sometime but he still has not returned her call.  I advised he should go ahead and call her back to see what this was in regards to.  He verbalized understanding of this and voiced no further questions at this time.

## 2011-01-18 ENCOUNTER — Telehealth: Payer: Self-pay | Admitting: Internal Medicine

## 2011-01-18 DIAGNOSIS — D860 Sarcoidosis of lung: Secondary | ICD-10-CM

## 2011-01-18 NOTE — Telephone Encounter (Signed)
The patient has decided he would like to go for pulm rehab at [facility] because this will be closer for him. He does have intake appt @ [facility] rehab on 5/10 and will keep that if he cannot do this at [facility]. Will send new order to [facility] for this.

## 2011-01-25 ENCOUNTER — Encounter (HOSPITAL_COMMUNITY): Payer: BC Managed Care – PPO | Attending: Internal Medicine

## 2011-01-25 DIAGNOSIS — R05 Cough: Secondary | ICD-10-CM | POA: Insufficient documentation

## 2011-01-25 DIAGNOSIS — R0989 Other specified symptoms and signs involving the circulatory and respiratory systems: Secondary | ICD-10-CM | POA: Insufficient documentation

## 2011-01-25 DIAGNOSIS — K219 Gastro-esophageal reflux disease without esophagitis: Secondary | ICD-10-CM | POA: Insufficient documentation

## 2011-01-25 DIAGNOSIS — R059 Cough, unspecified: Secondary | ICD-10-CM | POA: Insufficient documentation

## 2011-01-25 DIAGNOSIS — Z5189 Encounter for other specified aftercare: Secondary | ICD-10-CM | POA: Insufficient documentation

## 2011-01-25 DIAGNOSIS — R0609 Other forms of dyspnea: Secondary | ICD-10-CM | POA: Insufficient documentation

## 2011-01-25 DIAGNOSIS — Z8546 Personal history of malignant neoplasm of prostate: Secondary | ICD-10-CM | POA: Insufficient documentation

## 2011-01-25 DIAGNOSIS — D869 Sarcoidosis, unspecified: Secondary | ICD-10-CM | POA: Insufficient documentation

## 2011-01-30 ENCOUNTER — Encounter (HOSPITAL_COMMUNITY): Payer: BC Managed Care – PPO

## 2011-01-30 NOTE — Assessment & Plan Note (Signed)
[facility]                            CARDIOLOGY OFFICE NOTE   [name], [name]                       MRN:          [id number]  DATE:06/18/2008                            DOB:          [birth year]    Philip Hernandez returns today for followup.  He has had atypical chest pain  and dyspnea.  His echo showed no significant LV dysfunction and trivial  MR.  He had atypical chest pain and had a normal Myoview.  He has been  doing well.  He continues to have occasional dyspnea, which seems  functional.  He has fleeting episodes of pain.  He had successful right  arthroscopic shoulder surgery by Dr. Simonne Come.  He still has some pain.  I encouraged him to do his rehab and explained to him how important the  PT and OT is for shoulder surgery in regards to regaining his range of  motion.   His review of systems is otherwise negative.   He is on Androderm and Uroxatral.   He has no known allergies   PHYSICAL EXAMINATION:  VITAL SIGNS:  His weight is 193, blood pressure  is 122/80, pulse 79, respiratory rate 14, afebrile.  HEENT:  Unremarkable.  NECK:  Carotids normal without bruit.  No lymphadenopathy, thyromegaly,  or JVP elevation.  LUNGS:  Clear diaphragmatic motion.  No wheezing.  HEART:  S1 and S2.  Normal heart sounds.  PMI normal.  EXTREMITIES:  Right shoulder has a small arthroscopic surgical scars.  No swelling.  Distal pulses intact. No edema.  ABDOMEN:  Benign.  Bowel sounds positive.  No AAA.  No tenderness.  No  bruit.  No hepatosplenomegaly.  No hepatojugular reflux.  NEURO:  Nonfocal.  SKIN:  Warm and dry.  No muscular weakness.   His electrocardiogram is essentially normal with mild left atrial  enlargement.   IMPRESSION:  1. Chest pain atypical.  Normal Myoview.  Baby aspirin a day.      Continue to follow.  2. Dyspnea functional.  No evidence of left ventricular dysfunction or      valvular heart disease by echo.  Mitral regurgitation  is trivial      and not significant and not audible on exam.  No need for subacute      bacterial endocarditis prophylaxis.  3. Right shoulder surgery.  Follow up with Dr. Simonne Come.  Continue      PT.  We will see the patient back in a year.    [name]. Eden Emms, MD, [facility]  Electronically Signed   PCN/MedQ  DD: 06/18/2008  DT: 06/18/2008  Job #: [id number]
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
## 2011-01-30 NOTE — Op Note (Signed)
NAME:  Philip Hernandez, Philip Hernandez                ACCOUNT NO.:  [id number]   MEDICAL RECORD NO.:  [id number]          PATIENT TYPE:  AMB   LOCATION:  DAY                          FACILITY:  [facility]   PHYSICIAN:  Marlowe Kays, M.D.  DATE OF BIRTH:  09/02/1956   DATE OF PROCEDURE:  04/27/2008  DATE OF DISCHARGE:                               OPERATIVE REPORT   PREOPERATIVE DIAGNOSES:  Chronic impingement syndrome with rotator cuff  tendinopathy, right shoulder.   POSTOPERATIVE DIAGNOSES:  Chronic impingement syndrome with rotator cuff  tendinopathy, right shoulder.   OPERATION:  1. Right shoulder arthroscopy (normal glenohumeral examination).  2. Arthroscopic subacromial and distal clavicle decompression with      shaving of bursal surface of rotator cuff.   SURGEON:  Marlowe Kays, M.D.   ASSISTANT:  Mr. Idolina Primer, [place].   ANESTHESIA:  General.   PATHOLOGY AND JUSTIFICATION FOR PROCEDURE:  Chronically painful right  shoulder with an MRI demonstrating a bursal surface injury to the  rotator cuff and impingement of the distal acromion and also the  underneath surface of the distal clavicle.  The [facility] joint itself,  however, looked reasonably good, and I did not feel it required complete  distal clavicle excision.   DESCRIPTION OF PROCEDURE:  Satisfied general anesthesia, beach-chair  position on the [place] frame.  The right shoulder girdle was prepped with  DuraPrep and draped in a sterile field.  Anatomy of the shoulder joint  was marked out.  A timeout performed.  The proposed lateral and  posterior portals along with the subacromial space were all infiltrated  with half-percent Marcaine with adrenaline.  A posterior soft spot  portal atraumatically entered the glenohumeral joint which on  examination was normal.  Representative pictures were taken.  I then  redirected the scope to the subacromial space and through a lateral  portal used a 4.2 shaver.  He had fairly extensive bursitis,  which I  resected for visualization.  There was some subsequent bleeding which  was corrected using the 90 degree ArthroCare vaporizer through the  lateral portal.  I then began resecting soft tissue from the underneath  surface of the acromion and found that the distal clavicle in keeping  with plain x-rays and the MRI was a definite impingement factor, and I  resected soft tissue from the underneath surface of it as well.  I  followed this with a 4 mm oval bur, burring down the distal clavicle and  underneath surface of the acromion.  I alternated back-and-forth between  the three instruments to clear out soft tissue and shaved the bursa and  removed several fibrous bands in the anterior subacromial space.  Final  pictures were taken with the arm to the side and the arm abducted,  demonstrating wide subacromial and distal clavicle clearance.  All fluid  possible was then removed from the subacromial space, which I  reinjected along with the two portals once again with half-percent  Marcaine with adrenaline.  The two portals were closed with 4-0 nylon.  Betadine adaptic dry sterile dressings and shoulder immobilizer.  He  tolerated the  procedure well and was taken to recovery room in  satisfactory condition with no known complications.           ______________________________  Marlowe Kays, M.D.     JA/MEDQ  D:  04/27/2008  T:  04/27/2008  Job:  [id number]

## 2011-01-30 NOTE — Op Note (Signed)
NAME:  Philip Hernandez, Philip Hernandez                ACCOUNT NO.:  [id number]   MEDICAL RECORD NO.:  [id number]          PATIENT TYPE:  AMB   LOCATION:  [facility]                         FACILITY:  [facility]   PHYSICIAN:  Marlowe Kays, M.D.  DATE OF BIRTH:  1956-08-07   DATE OF PROCEDURE:  02/24/2009  DATE OF DISCHARGE:                               OPERATIVE REPORT   PREOPERATIVE DIAGNOSIS:  Traumatic arthritis, right acromioclavicular  joint.   POSTOPERATIVE DIAGNOSIS:  Traumatic arthritis, right acromioclavicular  joint.   OPERATION:  Open resection distal right clavicle.   SURGEON:  Marlowe Kays, M.D.   ASSISTANTDruscilla Brownie. Cherlynn June.   ANESTHESIA:  General.   PATHOLOGY AND JUSTIFICATION FOR PROCEDURE:  He has had a prior right  shoulder arthroscopy with subacromial decompression.  He has had  continued pain in the shoulder and had an arthrogram performed which  showed no rotator cuff tear.  I also injected the [facility] joint with local  anesthesia and that relieved his symptoms temporarily.  On this basis,  it was felt that his [facility] joint had traumatic arthritis as the cause for  his pain and he is here today for the above-mentioned procedure.   PROCEDURE:  Satisfactory general anesthesia, [place] frame in the  modified beach chair position, right shoulder girdle was prepped with  DuraPrep, draped in sterile field.  Ioban employed.  Time-out performed.  I marked out an incision over the distal clavicle to the [facility] joint and  infiltrated this with 0.5% Marcaine with adrenaline.  I then made  incision down to the distal clavicle identifying the [facility] joint.  Once  entering the superior portion of the [facility] joint, a small amount of clear  fluid came forth indicating chronic irritation.  After measuring 1.5 cm  medial to this, marking the clavicle, I undermined the clavicle at this  point and placed two baby Homans and used a micro saw to cut the  clavicle which I removed with towel clip and cutting  cautery technique.  There no remaining bony spicules on the cut portion of distal clavicle.  I also inspected the underlying rotator cuff moving the arm about.  There was no joint fluid present and no evidence for any tear,  confirming the arthrogram findings.  I then covered the distal clavicle  with bone wax and placed Gelfoam in the resection site.  We then closed  the wound after infiltrating it once again with 0.5% Marcaine with  adrenaline with interrupted #1 Vicryl in the fascia, 2-0 Vicryl in  subcutaneous tissue, Steri-Strips on the skin.  Dry sterile dressing and  shoulder immobilizer applied.  He tolerated the procedure well and was  taken to recovery room in satisfactory condition with no known  complications.           ______________________________  Marlowe Kays, M.D.     JA/MEDQ  D:  02/24/2009  T:  02/24/2009  Job:  [id number]

## 2011-01-31 ENCOUNTER — Ambulatory Visit (HOSPITAL_COMMUNITY): Payer: BC Managed Care – PPO

## 2011-02-01 ENCOUNTER — Encounter (HOSPITAL_COMMUNITY): Payer: BC Managed Care – PPO

## 2011-02-06 ENCOUNTER — Encounter (HOSPITAL_COMMUNITY): Payer: BC Managed Care – PPO

## 2011-02-08 ENCOUNTER — Encounter (HOSPITAL_COMMUNITY): Payer: BC Managed Care – PPO

## 2011-02-13 ENCOUNTER — Encounter (HOSPITAL_COMMUNITY): Payer: BC Managed Care – PPO

## 2011-02-15 ENCOUNTER — Encounter (HOSPITAL_COMMUNITY): Payer: BC Managed Care – PPO

## 2011-02-20 ENCOUNTER — Encounter (HOSPITAL_COMMUNITY): Payer: BC Managed Care – PPO

## 2011-02-20 ENCOUNTER — Ambulatory Visit: Payer: BC Managed Care – PPO | Admitting: Internal Medicine

## 2011-02-22 ENCOUNTER — Encounter (HOSPITAL_COMMUNITY): Payer: BC Managed Care – PPO

## 2011-02-27 ENCOUNTER — Encounter (HOSPITAL_COMMUNITY): Payer: BC Managed Care – PPO | Attending: Internal Medicine

## 2011-02-27 ENCOUNTER — Ambulatory Visit (INDEPENDENT_AMBULATORY_CARE_PROVIDER_SITE_OTHER): Payer: BC Managed Care – PPO | Admitting: Internal Medicine

## 2011-02-27 ENCOUNTER — Encounter: Payer: Self-pay | Admitting: Internal Medicine

## 2011-02-27 VITALS — BP 124/78 | HR 77 | Temp 98.1°F | Ht 72.0 in | Wt 182.2 lb

## 2011-02-27 DIAGNOSIS — D869 Sarcoidosis, unspecified: Secondary | ICD-10-CM

## 2011-02-27 DIAGNOSIS — R05 Cough: Secondary | ICD-10-CM

## 2011-02-27 DIAGNOSIS — R059 Cough, unspecified: Secondary | ICD-10-CM

## 2011-02-27 DIAGNOSIS — R0602 Shortness of breath: Secondary | ICD-10-CM

## 2011-02-27 DIAGNOSIS — K219 Gastro-esophageal reflux disease without esophagitis: Secondary | ICD-10-CM | POA: Insufficient documentation

## 2011-02-27 DIAGNOSIS — R0989 Other specified symptoms and signs involving the circulatory and respiratory systems: Secondary | ICD-10-CM | POA: Insufficient documentation

## 2011-02-27 DIAGNOSIS — Z5189 Encounter for other specified aftercare: Secondary | ICD-10-CM | POA: Insufficient documentation

## 2011-02-27 DIAGNOSIS — R0609 Other forms of dyspnea: Secondary | ICD-10-CM | POA: Insufficient documentation

## 2011-02-27 DIAGNOSIS — Z8546 Personal history of malignant neoplasm of prostate: Secondary | ICD-10-CM | POA: Insufficient documentation

## 2011-02-27 NOTE — Patient Instructions (Signed)
#  GERD  Acid reflux  - continue zegerid 40mg -1100 daily  - take over the counter ranitidine 300mg  po at bed time (cheap generic version available at [facility])  - follow diet sheet strictly  - do not eat for 3h before going to bed and sleep with head end of bed elevated #SINUS  - continue netti pot  - continue nasal steroid  #Cyclical cough  - you might have this. You should try 3 days of silence without talking or whispering. During this time, you should try lozenges or drink water when you have urge to cough  - #SARCOID  - continue prednisone down to 10mg  per day - have full PFT in 6-8 weeks and we can reassess if sarcoid contributing to symptoms and if we can cut steroid dose further #SHORTNESS OF BREATH  - continue [facility] #FOLLOWUP  - 6-8 weeks with PFTs  - come sooner if needed

## 2011-02-27 NOTE — Progress Notes (Signed)
Subjective:    Patient ID: Philip Hernandez, male    DOB: [birth year], 55 y.o.   MRN: [id number]  HPI  Ov 12/18/2010: Followup Stage 2 Pulmonary sarcoid (Also neck nodes, splenic granuloma), cough (due to sarcoid, sinus, and gerd) and class 2-3 dyspnea (due to sarcoid and deconditioning). At last office visit we deemed that sinus and gerd were more important contributors to cough than sarcoid. With active sinus and gerd control, cough is nearly gone. HE is compliant with instructions but he and wife are frustrated with rigidity of diet and want to splurge. Dyspnea persists unchanged and he wants this improved. Taking prednisone 20mg  per day. No side effcts. PFTs 12/04/2010 show mild obstruction on spiro (fev1 2.8L/76%, ratioo 69, normal DLCO but restriction on lung volume - TLC 79%). REC: GERD contril with diet and zegerid. Sinus control with netti pot and nasl steroid. Sarcoid - cut prednisone to 10mg  per day. Dyspnea - refer rehab. ROV 2 months  OV June 2011: Followup for above. Cough has improved a lot. Residual cough is mild and is clearly related to GERD. States that zegerid alone is not cutting it. Finds that he has to be diligent with diet. IF diet is violated, he coughs.  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 Only some residual sinus drainage present - netti pot helps. In addition, he admits today to some elements of habit cough (clears throat perioidically and feels tickle down his throat). IN terms of dyspnea, this is somewhat better. Started rehab 2.5 weeks ago and feels he is not being pushed hard enough. In terms of sarcoid, continues at prednisone 10mg  per day without problems   Review of Systems  Constitutional: Negative for fever and unexpected weight change.  HENT: Negative for ear pain, nosebleeds, congestion, sore throat, rhinorrhea, sneezing, trouble swallowing, dental problem, postnasal drip and sinus pressure.   Eyes: Negative for redness and  itching.  Respiratory: Negative for cough, chest tightness, shortness of breath and wheezing.   Cardiovascular: Negative for palpitations and leg swelling.  Gastrointestinal: Negative for nausea and vomiting.  Genitourinary: Negative for dysuria.  Musculoskeletal: Negative for joint swelling.  Skin: Negative for rash.  Neurological: Negative for headaches.  Hematological: Does not bruise/bleed easily.  Psychiatric/Behavioral: Negative for dysphoric mood. The patient is not nervous/anxious.        Objective:   Physical Exam Physical Exam  [nursing notereviewed. Constitutional: He is oriented to person, place, and time. He appears well-developed and well-nourished. No distress.  HENT:  Head: Normocephalic and atraumatic.  Right Ear: External ear normal.  Left Ear: External ear normal.  Mouth/Throat: Oropharynx is clear and moist. No oropharyngeal exudate.  Eyes: Conjunctivae and EOM are normal. Pupils are equal, round, and reactive to light. Right eye exhibits no discharge. Left eye exhibits no discharge. No scleral icterus.  Neck: Normal range of motion. Neck supple. No JVD present. No tracheal deviation present. No thyromegaly present.  Cardiovascular: Normal rate, regular rhythm and intact distal pulses.  Exam reveals no gallop and no friction rub.   No murmur heard. Pulmonary/Chest: Effort normal and breath sounds normal. No respiratory distress. He has no wheezes. He has no rales. He exhibits no tenderness.  Abdominal: Soft. Bowel sounds are normal. He exhibits no distension and no mass. There is no tenderness. There is no rebound and no guarding.  Musculoskeletal: Normal range of motion. He exhibits no edema and no tenderness.  Lymphadenopathy:    He has no cervical  adenopathy.  Neurological: He is alert and oriented to person, place, and time. He has normal reflexes. No cranial nerve deficit. Coordination normal.  Skin: Skin is warm and dry. No rash noted. He is not diaphoretic.  No erythema. No pallor.  Psychiatric: He has a normal mood and affect. His behavior is normal. Judgment and thought content normal.         Assessment & Plan:

## 2011-03-01 ENCOUNTER — Encounter (HOSPITAL_COMMUNITY): Payer: BC Managed Care – PPO

## 2011-03-04 ENCOUNTER — Encounter: Payer: Self-pay | Admitting: Internal Medicine

## 2011-03-04 NOTE — Assessment & Plan Note (Signed)
#  GERD  Acid reflux  - continue zegerid 40mg -1100 daily  - take over the counter ranitidine 300mg  po at bed time (cheap generic version available at [facility])  - follow diet sheet strictly  - do not eat for 3h before going to bed and sleep with head end of bed elevated #SINUS  - continue netti pot  - continue nasal steroid  #Cyclical cough  - you might have this. You should try 3 days of silence without talking or whispering. During this time, you should try lozenges or drink water when you have urge to cough

## 2011-03-04 NOTE — Assessment & Plan Note (Signed)
Clinically stable. No evidence of steroid intolerance so far   - #SARCOID  - continue prednisone down to 10mg  per day - have full PFT in 6-8 weeks and we can reassess if sarcoid contributing to symptoms and if we can cut steroid dose further

## 2011-03-04 NOTE — Assessment & Plan Note (Signed)
Some  What better after starting rehab  Plan Encourage to stay with rehab (he is a bit frustrated that rehab folks are not pushing him hard enough)

## 2011-03-06 ENCOUNTER — Encounter (HOSPITAL_COMMUNITY): Payer: BC Managed Care – PPO

## 2011-03-08 ENCOUNTER — Encounter (HOSPITAL_COMMUNITY): Payer: BC Managed Care – PPO

## 2011-03-13 ENCOUNTER — Encounter (HOSPITAL_COMMUNITY): Payer: BC Managed Care – PPO

## 2011-03-15 ENCOUNTER — Encounter (HOSPITAL_COMMUNITY): Payer: BC Managed Care – PPO

## 2011-03-20 ENCOUNTER — Encounter (HOSPITAL_COMMUNITY): Payer: BC Managed Care – PPO

## 2011-03-22 ENCOUNTER — Encounter (HOSPITAL_COMMUNITY): Payer: BC Managed Care – PPO

## 2011-03-23 ENCOUNTER — Other Ambulatory Visit: Payer: Self-pay | Admitting: *Deleted

## 2011-03-23 MED ORDER — OMEPRAZOLE-SODIUM BICARBONATE 40-1100 MG PO CAPS: 1.0000 | ORAL_CAPSULE | Freq: Every day | ORAL | Status: AC

## 2011-03-27 ENCOUNTER — Encounter (HOSPITAL_COMMUNITY): Payer: BC Managed Care – PPO | Attending: Internal Medicine

## 2011-03-27 DIAGNOSIS — Z5189 Encounter for other specified aftercare: Secondary | ICD-10-CM | POA: Insufficient documentation

## 2011-03-27 DIAGNOSIS — R05 Cough: Secondary | ICD-10-CM | POA: Insufficient documentation

## 2011-03-27 DIAGNOSIS — R059 Cough, unspecified: Secondary | ICD-10-CM | POA: Insufficient documentation

## 2011-03-27 DIAGNOSIS — Z8546 Personal history of malignant neoplasm of prostate: Secondary | ICD-10-CM | POA: Insufficient documentation

## 2011-03-27 DIAGNOSIS — K219 Gastro-esophageal reflux disease without esophagitis: Secondary | ICD-10-CM | POA: Insufficient documentation

## 2011-03-27 DIAGNOSIS — D869 Sarcoidosis, unspecified: Secondary | ICD-10-CM | POA: Insufficient documentation

## 2011-03-27 DIAGNOSIS — R0609 Other forms of dyspnea: Secondary | ICD-10-CM | POA: Insufficient documentation

## 2011-03-27 DIAGNOSIS — R0989 Other specified symptoms and signs involving the circulatory and respiratory systems: Secondary | ICD-10-CM | POA: Insufficient documentation

## 2011-03-29 ENCOUNTER — Encounter (HOSPITAL_COMMUNITY): Payer: BC Managed Care – PPO

## 2011-04-03 ENCOUNTER — Encounter (HOSPITAL_COMMUNITY): Payer: BC Managed Care – PPO

## 2011-04-05 ENCOUNTER — Encounter (HOSPITAL_COMMUNITY): Payer: BC Managed Care – PPO

## 2011-04-10 ENCOUNTER — Encounter (HOSPITAL_COMMUNITY): Payer: BC Managed Care – PPO

## 2011-04-12 ENCOUNTER — Encounter (HOSPITAL_COMMUNITY): Payer: BC Managed Care – PPO

## 2011-04-16 ENCOUNTER — Ambulatory Visit: Payer: BC Managed Care – PPO | Admitting: Internal Medicine

## 2011-04-17 ENCOUNTER — Encounter (HOSPITAL_COMMUNITY): Payer: BC Managed Care – PPO

## 2011-04-18 ENCOUNTER — Encounter: Payer: Self-pay | Admitting: Internal Medicine

## 2011-04-19 ENCOUNTER — Encounter (HOSPITAL_COMMUNITY): Payer: BC Managed Care – PPO | Attending: Internal Medicine

## 2011-04-19 DIAGNOSIS — R0609 Other forms of dyspnea: Secondary | ICD-10-CM | POA: Insufficient documentation

## 2011-04-19 DIAGNOSIS — K219 Gastro-esophageal reflux disease without esophagitis: Secondary | ICD-10-CM | POA: Insufficient documentation

## 2011-04-19 DIAGNOSIS — D869 Sarcoidosis, unspecified: Secondary | ICD-10-CM | POA: Insufficient documentation

## 2011-04-19 DIAGNOSIS — Z8546 Personal history of malignant neoplasm of prostate: Secondary | ICD-10-CM | POA: Insufficient documentation

## 2011-04-19 DIAGNOSIS — Z5189 Encounter for other specified aftercare: Secondary | ICD-10-CM | POA: Insufficient documentation

## 2011-04-19 DIAGNOSIS — R05 Cough: Secondary | ICD-10-CM | POA: Insufficient documentation

## 2011-04-19 DIAGNOSIS — R0989 Other specified symptoms and signs involving the circulatory and respiratory systems: Secondary | ICD-10-CM | POA: Insufficient documentation

## 2011-04-19 DIAGNOSIS — R059 Cough, unspecified: Secondary | ICD-10-CM | POA: Insufficient documentation

## 2011-04-23 ENCOUNTER — Ambulatory Visit (INDEPENDENT_AMBULATORY_CARE_PROVIDER_SITE_OTHER): Payer: BC Managed Care – PPO | Admitting: Internal Medicine

## 2011-04-23 ENCOUNTER — Encounter: Payer: Self-pay | Admitting: Internal Medicine

## 2011-04-23 VITALS — BP 120/70 | HR 84 | Temp 97.5°F | Ht 70.0 in | Wt 180.0 lb

## 2011-04-23 DIAGNOSIS — D869 Sarcoidosis, unspecified: Secondary | ICD-10-CM

## 2011-04-23 DIAGNOSIS — R0602 Shortness of breath: Secondary | ICD-10-CM

## 2011-04-23 DIAGNOSIS — R05 Cough: Secondary | ICD-10-CM

## 2011-04-23 DIAGNOSIS — R059 Cough, unspecified: Secondary | ICD-10-CM

## 2011-04-23 LAB — PULMONARY FUNCTION TEST

## 2011-04-23 MED ORDER — PREDNISONE 5 MG PO TABS: 5.0000 mg | ORAL_TABLET | Freq: Every day | ORAL | Status: AC

## 2011-04-23 NOTE — Assessment & Plan Note (Signed)
Aug 2012: cough imprpved but still +. GERD/LPR issues predominate. WFBU[facility]  Reflux Symptom Index Score 18.5 (>14) and c/w of LPR. Note: did not start ranitidine as advised last ov  PLAN Long discussion with him (> 25 min face to face time with > 50% face to face counseling) about cough mechansims, uncertainty on LPR or Sarcoid causing cough and need for improved compliance with diet. Different options including re-referral to GI discused. Agreed to show improved adherence to diet and add H2 blockade to PPI and reassess. Will also cut prednisone downt to 5mg  for sarcoid and see if this helps. At fu, if cough still issue - re-referl GI vs cyclical cough protocol versus speech therapy referral

## 2011-04-23 NOTE — Assessment & Plan Note (Addendum)
PFTss very stable and only shows mild restriction wihtout change since march 2012. I feel that cough is mainly due to LPR more than sarcoid. Therefore, will reduce prednisone down to 5mg  per day and reassess in 8 weeks with spiro and cxr. If my hypothesis correct, cutting down on prednison could potentially help GERD/LPR cough  8 min face to face. > 50% in counseling.

## 2011-04-23 NOTE — Assessment & Plan Note (Signed)
Aug 2012: rehab not helpin. PFT no change since march - mild restriction only. Stop rehab  2 min face to face counseling. > 50% in counseling

## 2011-04-23 NOTE — Progress Notes (Signed)
Subjective:    Patient ID: Philip Hernandez, male    DOB: 1956-02-19, 55 y.o.   MRN: [id number]  HPI Ov 12/18/2010: Followup Stage 2 Pulmonary sarcoid (Also neck nodes, splenic granuloma), cough (due to sarcoid, sinus, and gerd) and class 2-3 dyspnea (due to sarcoid and deconditioning). At last office visit we deemed that sinus and gerd were more important contributors to cough than sarcoid. With active sinus and gerd control, cough is nearly gone. HE is compliant with instructions but he and wife are frustrated with rigidity of diet and want to splurge. Dyspnea persists unchanged and he wants this improved. Taking prednisone 20mg  per day. No side effcts. PFTs 12/04/2010 show mild obstruction on spiro (fev1 2.8L/76%, ratioo 69, normal DLCO but restriction on lung volume - TLC 79%). REC: GERD contril with diet and zegerid. Sinus control with netti pot and nasl steroid. Sarcoid - cut prednisone to 10mg  per day. Dyspnea - refer rehab. ROV 2 months  OV June 2011: Followup for above. Cough has improved a lot. Residual cough is mild and is clearly related to GERD. States that zegerid alone is not cutting it. Finds that he has to be diligent with diet. IF diet is violated, he coughs.  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 Only some residual sinus drainage present - netti pot helps. In addition, he admits today to some elements of habit cough (clears throat perioidically and feels tickle down his throat). IN terms of dyspnea, this is somewhat better. Started rehab 2.5 weeks ago and feels he is not being pushed hard enough. In terms of sarcoid, continues at prednisone 10mg  per day without problems.  REC: 0. COUGH - mostly due to GERD, SINUS and LPR and less due to SARCOID 1. GERD CONTROL - zegerid + start ranitidine + diet sheet + sleep with hob elevated  2. SINUS - netti pot and saline wash 3. cYCLICAL COUGH - 3 days of silence and distraction techniques 4. SARCOID  - continue  prednisone at 10mg  per day. REassess in 6-8 weeks, continue rehab for dyspnea  OV 04/23/2011:  Followup dyspnea, multifactorial cough (gerd, sinus, lpr main issues and some due to sarcoid),  and multisystem (calcified hilar nodes, calcified splenic granuloma, non calcified cervical nodes andbilateral pulm infitorates  - Nov 2011) sarcoid with pulmonary stage 2. Regarding dyspnea: unchanged, exertional with carrying or pushing/pulling objects, variable with some days worse and others, he got worse with moving trash can today. Rehab x 1-2 months (missed 8 sessions though) is not helping. Regarding cough: overall improved. Residual cough is mild-moderate. He still confirms diet playing a big role in control/relapse though feels this might not be the whole story. He is taking Zegerid but is not taking ranitidine as advised.  Feels throat is congested - mucus and has to clear throat a lot. REflux Symptom index (RSI) score is 18.5 of 45 which is c/w LPR cough. Ocassionally feels acid in throat +. Off saline wash x 3 weeks but no impact on cough. In terms of sarcoid, still on prednison at 10mg  per day; no noted side effects.   PFTs today -fev1 2.86L/78%, No BD response, Ratip 71 (72), TLC 77%, DLCO normal 81% - essentially unchanged 12/04/2010.    Review of Systems  Constitutional: Negative for fever and unexpected weight change.  HENT: Negative for ear pain, nosebleeds, congestion, sore throat, rhinorrhea, sneezing, trouble swallowing, dental problem, postnasal drip and sinus pressure.   Eyes: Negative for redness and  itching.  Respiratory: Positive for cough and shortness of breath. Negative for chest tightness and wheezing.   Cardiovascular: Negative for palpitations and leg swelling.  Gastrointestinal: Negative for nausea and vomiting.  Genitourinary: Negative for dysuria.  Musculoskeletal: Negative for joint swelling.  Skin: Negative for rash.  Neurological: Negative for headaches.  Hematological: Does  not bruise/bleed easily.  Psychiatric/Behavioral: Negative for dysphoric mood. The patient is not nervous/anxious.        Objective:   Physical Exam  Nursing note and vitals reviewed. Constitutional: He is oriented to person, place, and time. He appears well-developed and well-nourished. No distress.  HENT:  Head: Normocephalic and atraumatic.  Right Ear: External ear normal.  Left Ear: External ear normal.  Mouth/Throat: Oropharynx is clear and moist. No oropharyngeal exudate.  Eyes: Conjunctivae and EOM are normal. Pupils are equal, round, and reactive to light. Right eye exhibits no discharge. Left eye exhibits no discharge. No scleral icterus.  Neck: Normal range of motion. Neck supple. No JVD present. No tracheal deviation present. No thyromegaly present.  Cardiovascular: Normal rate, regular rhythm and intact distal pulses.  Exam reveals no gallop and no friction rub.   No murmur heard. Pulmonary/Chest: Effort normal and breath sounds normal. No respiratory distress. He has no wheezes. He has no rales. He exhibits no tenderness.  Abdominal: Soft. Bowel sounds are normal. He exhibits no distension and no mass. There is no tenderness. There is no rebound and no guarding.  Musculoskeletal: Normal range of motion. He exhibits no edema and no tenderness.  Lymphadenopathy:    He has no cervical adenopathy.  Neurological: He is alert and oriented to person, place, and time. He has normal reflexes. No cranial nerve deficit. Coordination normal.  Skin: Skin is warm and dry. No rash noted. He is not diaphoretic. No erythema. No pallor.  Psychiatric: He has a normal mood and affect. His behavior is normal. Judgment and thought content normal.          Assessment & Plan:

## 2011-04-23 NOTE — Progress Notes (Signed)
PFT done today. 

## 2011-04-23 NOTE — Patient Instructions (Signed)
Cough is due to acid  Reflux, sinus, (combined called LPR cough, score of 18.5 suggests this) and possibly sarcoid though I think currently sarcoid is stable and not the main reason for cough #GERD  Acid reflux  - continue zegerid 40mg -1100 daily  - PLEASE ADD over the counter ranitidine 300mg  po at bed time (cheap generic version available at [facility])  - follow diet sheet strictly  - do not eat for 3h before going to bed and sleep with head end of bed elevated #SINUS  - continue netti pot - if this is not helping, you can stop it  - continue nasal steroid  #Cyclical cough  - if above treatment not helping then at followup we can try cyclical cough protocol including referral to [facility] speech therapist or GI re-referral  - #SARCOID - stable on breathing tests  - continue prednisone down to 5mg  per day - take prescription - have spirometry and cxr in  6-8 weeks and we can reassess if sarcoid contributing to symptoms  #SHORTNESS OF BREATH  - if rehab not helping, stop it #FOLLOWUP  - 6-8 weeks with CXR and office spirometry  - come sooner if needed

## 2011-04-24 ENCOUNTER — Encounter (HOSPITAL_COMMUNITY): Payer: BC Managed Care – PPO

## 2011-04-26 ENCOUNTER — Encounter (HOSPITAL_COMMUNITY): Payer: BC Managed Care – PPO

## 2011-05-01 ENCOUNTER — Encounter (HOSPITAL_COMMUNITY): Payer: BC Managed Care – PPO

## 2011-05-03 ENCOUNTER — Encounter (HOSPITAL_COMMUNITY): Payer: BC Managed Care – PPO

## 2011-05-07 ENCOUNTER — Encounter: Payer: Self-pay | Admitting: Internal Medicine

## 2011-05-08 ENCOUNTER — Encounter (HOSPITAL_COMMUNITY): Payer: BC Managed Care – PPO

## 2011-05-10 ENCOUNTER — Encounter (HOSPITAL_COMMUNITY): Payer: BC Managed Care – PPO

## 2011-06-04 ENCOUNTER — Encounter: Payer: Self-pay | Admitting: Internal Medicine

## 2011-06-04 ENCOUNTER — Ambulatory Visit (INDEPENDENT_AMBULATORY_CARE_PROVIDER_SITE_OTHER): Payer: BC Managed Care – PPO | Admitting: Internal Medicine

## 2011-06-04 ENCOUNTER — Ambulatory Visit (INDEPENDENT_AMBULATORY_CARE_PROVIDER_SITE_OTHER)
Admission: RE | Admit: 2011-06-04 | Discharge: 2011-06-04 | Disposition: A | Discharge status: Home / Self Care | Payer: BC Managed Care – PPO | Source: Ambulatory Visit | Attending: Internal Medicine | Admitting: Internal Medicine

## 2011-06-04 VITALS — BP 100/62 | HR 67 | Temp 98.3°F | Ht 72.0 in | Wt 179.6 lb

## 2011-06-04 DIAGNOSIS — R0602 Shortness of breath: Secondary | ICD-10-CM

## 2011-06-04 DIAGNOSIS — J3489 Other specified disorders of nose and nasal sinuses: Secondary | ICD-10-CM

## 2011-06-04 DIAGNOSIS — D869 Sarcoidosis, unspecified: Secondary | ICD-10-CM

## 2011-06-04 DIAGNOSIS — R05 Cough: Secondary | ICD-10-CM

## 2011-06-04 DIAGNOSIS — R059 Cough, unspecified: Secondary | ICD-10-CM

## 2011-06-04 MED ORDER — GABAPENTIN 300 MG PO CAPS: ORAL_CAPSULE | ORAL | Status: DC

## 2011-06-04 NOTE — Patient Instructions (Addendum)
Cough is due to acid  Reflux, sinus, (combined called LPR cough, score of 28 suggests this) and possibly sarcoid though I think currently sarcoid is stable and not the main reason for cough though your cough got worse after we cut prednisone down #GERD  Acid reflux  - continue zegerid 40mg -1100 daily  - PLEASE continue over the counter ranitidine 300mg  po at bed time  - follow diet sheet strictly  - do not eat for 3h before going to bed and sleep with head end of bed elevated #SINUS  - continue netti pot - if this is not helping, you can stop it  - continue nasal steroid  -  Refer to [facility] ENT for sinus eval #Cyclical cough  - start gabapentin 300mg  daily x 5 days, then 300mg  twice daily x 5 days, then 300mg  three times daily to continue - when you have urge to cough or clear throat drink water or suck on lozenges   - #SARCOID - stable on breathing tests  - continue prednisone at 5mg  per day - - have spirometry and cxr in  6-8 weeks and we can reassess if sarcoid contributing to symptoms   #SHORTNESS OF BREATH  - continue rehab   #FOLLOWUP  - 6 weeks with full PFT  - come sooner if needed

## 2011-06-04 NOTE — Progress Notes (Signed)
Subjective:    Patient ID: Philip Hernandez, male    DOB: 28-Nov-1955, 55 y.o.   MRN: [id number]  HPI Ov 12/18/2010: Followup Stage 2 Pulmonary sarcoid (Also neck nodes, splenic granuloma), cough (due to sarcoid, sinus, and gerd) and class 2-3 dyspnea (due to sarcoid and deconditioning). At last office visit we deemed that sinus and gerd were more important contributors to cough than sarcoid. With active sinus and gerd control, cough is nearly gone. HE is compliant with instructions but he and wife are frustrated with rigidity of diet and want to splurge. Dyspnea persists unchanged and he wants this improved. Taking prednisone 20mg  per day. No side effcts. PFTs 12/04/2010 show mild obstruction on spiro (fev1 2.8L/76%, ratioo 69, normal DLCO but restriction on lung volume - TLC 79%). REC: GERD contril with diet and zegerid. Sinus control with netti pot and nasl steroid. Sarcoid - cut prednisone to 10mg  per day. Dyspnea - refer rehab. ROV 2 months  OV June 2011: Followup for above. Cough has improved a lot. Residual cough is mild and is clearly related to GERD. States that zegerid alone is not cutting it. Finds that he has to be diligent with diet. IF diet is violated, he coughs.  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 Only some residual sinus drainage present - netti pot helps. In addition, he admits today to some elements of habit cough (clears throat perioidically and feels tickle down his throat). IN terms of dyspnea, this is somewhat better. Started rehab 2.5 weeks ago and feels he is not being pushed hard enough. In terms of sarcoid, continues at prednisone 10mg  per day without problems.  REC: 0. COUGH - mostly due to GERD, SINUS and LPR and less due to SARCOID 1. GERD CONTROL - zegerid + start ranitidine + diet sheet + sleep with hob elevated  2. SINUS - netti pot and saline wash 3. cYCLICAL COUGH - 3 days of silence and distraction techniques 4. SARCOID  - continue  prednisone at 10mg  per day. REassess in 6-8 weeks, continue rehab for dyspnea  OV 04/23/2011:  Followup dyspnea, multifactorial cough (gerd, sinus, lpr main issues and some due to sarcoid),  and multisystem (calcified hilar nodes, calcified splenic granuloma, non calcified cervical nodes andbilateral pulm infitorates  - Nov 2011) sarcoid with pulmonary stage 2. Regarding dyspnea: unchanged, exertional with carrying or pushing/pulling objects, variable with some days worse and others, he got worse with moving trash can today. Rehab x 1-2 months (missed 8 sessions though) is not helping. Regarding cough: overall improved. Residual cough is mild-moderate. He still confirms diet playing a big role in control/relapse though feels this might not be the whole story. He is taking Zegerid but is not taking ranitidine as advised.  Feels throat is congested - mucus and has to clear throat a lot. REflux Symptom index (RSI) score is 18.5 of 45 which is c/w LPR cough. Ocassionally feels acid in throat +. Off saline wash x 3 weeks but no impact on cough. In terms of sarcoid, still on prednison at 10mg  per day; no noted side effects.    PFTs today -fev1 2.86L/78%, No BD response, Ratip 71 (72), TLC 77%, DLCO normal 81% - essentially unchanged 12/04/2010.  REC Cough is due to acid  Reflux, sinus, (combined called LPR cough, score of 18.5 suggests this) and possibly sarcoid though I think currently sarcoid is stable and not the main reason for cough #GERD  Acid reflux  -  continue zegerid 40mg -1100 daily  - PLEASE ADD over the counter ranitidine 300mg  po at bed time (cheap generic version available at [facility])  - follow diet sheet strictly  - do not eat for 3h before going to bed and sleep with head end of bed elevated #SINUS  - continue netti pot - if this is not helping, you can stop it  - continue nasal steroid  #Cyclical cough  - if above treatment not helping then at followup we can try cyclical cough protocol  including referral to [facility] speech therapist or GI re-referral  - #SARCOID - stable on breathing tests  - continue prednisone down to 5mg  per day - take prescription - have spirometry and cxr in  6-8 weeks and we can reassess if sarcoid contributing to symptoms  #SHORTNESS OF BREATH  - if rehab not helping, stop it #FOLLOWUP  - 6-8 weeks with CXR and office spirometry  - come sooner if needed   OV 06/04/11: Cough is worse. Reportes he followed all of above instructions from last visit. REports mild hoarsness, mod-sever clearing of throat, post nasal drop, choking episodes, feeling of lump in throat and moderate feelings of dysphagia, heartburn and cough worse after lying down. He had had GI and ENT eval per hx in past without showing anyting anatomic in throat but he is  Convinced he has something in throat. He is tolerting prednisone 5mg  okay. Spiro fev1 2.7L/68%, fvc 3.7L/73% is unchanged and cxr for sarcoid is unchanged too (though radioloigiust thought is a bit worse). There are no new symptoms       Review of Systems  Constitutional: Negative for fever and unexpected weight change.  HENT: Negative for ear pain, nosebleeds, congestion, sore throat, rhinorrhea, sneezing, trouble swallowing, dental problem, postnasal drip and sinus pressure.   Eyes: Negative for redness and itching.  Respiratory: Positive for cough and shortness of breath. Negative for chest tightness and wheezing.   Cardiovascular: Negative for palpitations and leg swelling.  Gastrointestinal: Negative for nausea and vomiting.  Genitourinary: Negative for dysuria.  Musculoskeletal: Negative for joint swelling.  Skin: Negative for rash.  Neurological: Negative for headaches.  Hematological: Does not bruise/bleed easily.  Psychiatric/Behavioral: Negative for dysphoric mood. The patient is not nervous/anxious.        Objective:   Physical Exam Nursing note and vitals reviewed. Constitutional: He is oriented  to person, place, and time. He appears well-developed and well-nourished. No distress.  HENT:  Head: Normocephalic and atraumatic.  Right Ear: External ear normal.  Left Ear: External ear normal.  Mouth/Throat: Oropharynx is clear and moist. No oropharyngeal exudate.  Eyes: Conjunctivae and EOM are normal. Pupils are equal, round, and reactive to light. Right eye exhibits no discharge. Left eye exhibits no discharge. No scleral icterus.  Neck: Normal range of motion. Neck supple. No JVD present. No tracheal deviation present. No thyromegaly present.  Cardiovascular: Normal rate, regular rhythm and intact distal pulses.  Exam reveals no gallop and no friction rub.   No murmur heard. Pulmonary/Chest: Effort normal and breath sounds normal. No respiratory distress. He has no wheezes. He has no rales. He exhibits no tenderness.  Abdominal: Soft. Bowel sounds are normal. He exhibits no distension and no mass. There is no tenderness. There is no rebound and no guarding.  Musculoskeletal: Normal range of motion. He exhibits no edema and no tenderness.  Lymphadenopathy:    He has no cervical adenopathy.  Neurological: He is alert and oriented to person, place, and  time. He has normal reflexes. No cranial nerve deficit. Coordination normal.  Skin: Skin is warm and dry. No rash noted. He is not diaphoretic. No erythema. No pallor.  Psychiatric: He has a normal mood and affect. His behavior is normal. Judgment and thought content normal.            Assessment & Plan:

## 2011-06-06 NOTE — Assessment & Plan Note (Addendum)
Sept 2012: cough worse. Kouffman RSI score 28. This is c./w LPR. Story is c/w  LPR. He is clearing his throat all the time in office. He agrees to try neurontin but is convinced there is something anatomic in his throat. I will refer him back to ENT. Will address if sarcoid is causing cough once LPR issues better sorted out.   PLAN ( > 50% of time in this > 25 min visit for  face to face cousneling) Cough is due to acid  Reflux, sinus, (combined called LPR cough, score of 28 suggests this) and possibly sarcoid though I think currently sarcoid is stable and not the main reason for cough though your cough got worse after we cut prednisone down #GERD  Acid reflux  - continue zegerid 40mg -1100 daily  - PLEASE continue over the counter ranitidine 300mg  po at bed time  - follow diet sheet strictly  - do not eat for 3h before going to bed and sleep with head end of bed elevated #SINUS  - continue netti pot - if this is not helping, you can stop it  - continue nasal steroid  -  Refer to [facility] ENT for sinus eval #Cyclical cough  - start gabapentin 300mg  daily x 5 days, then 300mg  twice daily x 5 days, then 300mg  three times daily to continue - when you have urge to cough or clear throat drink water or suck on lozenges

## 2011-06-06 NOTE — Assessment & Plan Note (Signed)
Improved  Will monitor

## 2011-06-06 NOTE — Assessment & Plan Note (Addendum)
06/04/11:  Radioligy thinks cxr worse, I think is same. Spirometry is unchanges. So, i think cough is due to LPR or atleast need to address LPR cough more aggressively before deciding to increase pred for sarcoid. For now, continue pred at 5mg  per day

## 2011-06-15 LAB — HEMOGLOBIN AND HEMATOCRIT, BLOOD
HCT: 42.1
Hemoglobin: 14.4

## 2011-07-18 ENCOUNTER — Ambulatory Visit (INDEPENDENT_AMBULATORY_CARE_PROVIDER_SITE_OTHER): Payer: BC Managed Care – PPO | Admitting: Internal Medicine

## 2011-07-18 ENCOUNTER — Encounter: Payer: Self-pay | Admitting: Internal Medicine

## 2011-07-18 DIAGNOSIS — D869 Sarcoidosis, unspecified: Secondary | ICD-10-CM

## 2011-07-18 DIAGNOSIS — R05 Cough: Secondary | ICD-10-CM

## 2011-07-18 DIAGNOSIS — R059 Cough, unspecified: Secondary | ICD-10-CM

## 2011-07-18 LAB — PULMONARY FUNCTION TEST

## 2011-07-18 MED ORDER — PREDNISONE 5 MG PO TABS: 5.0000 mg | ORAL_TABLET | Freq: Every day | ORAL | Status: AC

## 2011-07-18 MED ORDER — DOXYCYCLINE HYCLATE 100 MG PO TABS: 100.0000 mg | ORAL_TABLET | Freq: Two times a day (BID) | ORAL | Status: AC

## 2011-07-18 NOTE — Patient Instructions (Addendum)
Cough is due to acid  Reflux, sinus, and possibly sarcoid though currently sarcoid is very stable and not the main reason for cough #GERD  Acid reflux  - continue zegerid 40mg -1100 daily  - PLEASE continue over the counter ranitidine 300mg  po at bed time  - follow diet sheet strictly  - do not eat for 3h before going to bed and sleep with head end of bed elevated #SINUS  - continue netti pot - if this is not helping, you can stop it  - continue nasal steroid  -  Follow advise of Dr [name]  -you have a bit of sinus infection currently: take doxycycline 100mg  po twice daily after meals x 7 days; avoid sunligh #Cyclical cough  - continue gabapentin 300mg  three times daily - can cut down to 2 times daily if making you sleepy - when you have urge to cough or clear throat drink water or suck on lozenges  - #SARCOID - stable on breathing tests  - continue prednisone at 5mg  per day - -#SHORTNESS OF BREATH  - continue rehab   #FOLLOWUP  - - return in 2-3 months  - I will ask  Dr Sherene Sires one of my office colleagues just to make sure we are on the right track if he is willing to see you at followup

## 2011-07-18 NOTE — Progress Notes (Signed)
Subjective:    Patient ID: Philip Hernandez, male    DOB: 11/05/1955, 55 y.o.   MRN: [id number]  HPI  Ov 12/18/2010: Followup Stage 2 Pulmonary sarcoid (Also neck nodes, splenic granuloma), cough (due to sarcoid, sinus, and gerd) and class 2-3 dyspnea (due to sarcoid and deconditioning). At last office visit we deemed that sinus and gerd were more important contributors to cough than sarcoid. With active sinus and gerd control, cough is nearly gone. HE is compliant with instructions but he and wife are frustrated with rigidity of diet and want to splurge. Dyspnea persists unchanged and he wants this improved. Taking prednisone 20mg  per day. No side effcts. PFTs 12/04/2010 show mild obstruction on spiro (fev1 2.8L/76%, ratioo 69, normal DLCO but restriction on lung volume - TLC 79%). REC: GERD contril with diet and zegerid. Sinus control with netti pot and nasl steroid. Sarcoid - cut prednisone to 10mg  per day. Dyspnea - refer rehab. ROV 2 months  OV June 2011: Followup for above. Cough has improved a lot. Residual cough is mild and is clearly related to GERD. States that zegerid alone is not cutting it. Finds that he has to be diligent with diet. IF diet is violated, he coughs.  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 Only some residual sinus drainage present - netti pot helps. In addition, he admits today to some elements of habit cough (clears throat perioidically and feels tickle down his throat). IN terms of dyspnea, this is somewhat better. Started rehab 2.5 weeks ago and feels he is not being pushed hard enough. In terms of sarcoid, continues at prednisone 10mg  per day without problems.  REC: 0. COUGH - mostly due to GERD, SINUS and LPR and less due to SARCOID 1. GERD CONTROL - zegerid + start ranitidine + diet sheet + sleep with hob elevated  2. SINUS - netti pot and saline wash 3. cYCLICAL COUGH - 3 days of silence and distraction techniques 4. SARCOID  - continue  prednisone at 10mg  per day. REassess in 6-8 weeks, continue rehab for dyspnea  OV 04/23/2011:  Followup dyspnea, multifactorial cough (gerd, sinus, lpr main issues and some due to sarcoid),  and multisystem (calcified hilar nodes, calcified splenic granuloma, non calcified cervical nodes andbilateral pulm infitorates  - Nov 2011) sarcoid with pulmonary stage 2. Regarding dyspnea: unchanged, exertional with carrying or pushing/pulling objects, variable with some days worse and others, he got worse with moving trash can today. Rehab x 1-2 months (missed 8 sessions though) is not helping. Regarding cough: overall improved. Residual cough is mild-moderate. He still confirms diet playing a big role in control/relapse though feels this might not be the whole story. He is taking Zegerid but is not taking ranitidine as advised.  Feels throat is congested - mucus and has to clear throat a lot. REflux Symptom index (RSI) score is 18.5 of 45 which is c/w LPR cough. Ocassionally feels acid in throat +. Off saline wash x 3 weeks but no impact on cough. In terms of sarcoid, still on prednison at 10mg  per day; no noted side effects.    PFTs today -fev1 2.86L/78%, No BD response, Ratip 71 (72), TLC 77%, DLCO normal 81% - essentially unchanged 12/04/2010.   OV 06/04/11: Cough is worse. RSO Socre 28. Reportes he followed all of above instructions from last visit. REports mild hoarsness, mod-sever clearing of throat, post nasal drop, choking episodes, feeling of lump in throat and moderate feelings of  dysphagia, heartburn and cough worse after lying down. He had had GI and ENT eval per hx in past without showing anyting anatomic in throat but he is  Convinced he has something in throat. He is tolerting prednisone 5mg  okay. Spiro fev1 2.7L/68%, fvc 3.7L/73% is unchanged and cxr for sarcoid is unchanged too (though radioloigiust thought is a bit worse). There are no new symptoms   Cough is due to acid  Reflux, sinus, (combined  called LPR cough, score of 28 suggests this) and possibly sarcoid though I think currently sarcoid is stable and not the main reason for cough though your cough got worse after we cut prednisone down #GERD  Acid reflux  - continue zegerid 40mg -1100 daily  - PLEASE continue over the counter ranitidine 300mg  po at bed time  - follow diet sheet strictly  - do not eat for 3h before going to bed and sleep with head end of bed elevated #SINUS  - continue netti pot - if this is not helping, you can stop it  - continue nasal steroid  -  Refer to [facility] ENT for sinus eval #Cyclical cough  - start gabapentin 300mg  daily x 5 days, then 300mg  twice daily x 5 days, then 300mg  three times daily to continue - when you have urge to cough or clear throat drink water or suck on lozenges   - #SARCOID - stable on breathing tests  - continue prednisone at 5mg  per day - - have spirometry and cxr in  6-8 weeks and we can reassess if sarcoid contributing to symptoms   #SHORTNESS OF BREATH  - continue rehab   #FOLLOWUP  - 6 weeks with full PFT  - come sooner if needed  OV 07/18/11: RSI cough score is 17 and improved from 28 at last visit. He is satisfied with cough improvement but still has cough. Says he saw ENT 07/04/11: per hx CT showed left maxillary sinusitis and was given abx for 10 days. Option of surgery discussed but for now only clinical monitoring. Says nose is still clogged up and worse now after [name] 3 days ago. Feels head is swollen. Taking gabapentin as well: thinks this is helping though makes him a bit sleey.  PFT today is near normal  Past, Family and social: No change  Review of Systems    11 point ROS negattive except as stated in HPI Objective:   Physical Exam  Nursing note and vitals reviewed. Constitutional: He is oriented to person, place, and time. He appears well-developed and well-nourished. No distress.  HENT:  Head: Normocephalic and atraumatic.  Right Ear:  External ear normal.  Left Ear: External ear normal.  Mouth/Throat: Oropharynx is clear and moist. No oropharyngeal exudate.  Eyes: Conjunctivae and EOM are normal. Pupils are equal, round, and reactive to light. Right eye exhibits no discharge. Left eye exhibits no discharge. No scleral icterus.  Neck: Normal range of motion. Neck supple. No JVD present. No tracheal deviation present. No thyromegaly present.  Cardiovascular: Normal rate, regular rhythm and intact distal pulses.  Exam reveals no gallop and no friction rub.   No murmur heard. Pulmonary/Chest: Effort normal and breath sounds normal. No respiratory distress. He has no wheezes. He has no rales. He exhibits no tenderness.  Abdominal: Soft. Bowel sounds are normal. He exhibits no distension and no mass. There is no tenderness. There is no rebound and no guarding.  Musculoskeletal: Normal range of motion. He exhibits no edema and no tenderness.  Lymphadenopathy:  He has no cervical adenopathy.  Neurological: He is alert and oriented to person, place, and time. He has normal reflexes. No cranial nerve deficit. Coordination normal.  Skin: Skin is warm and dry. No rash noted. He is not diaphoretic. No erythema. No pallor.  Psychiatric: He has a normal mood and affect. His behavior is normal. Judgment and thought content normal.          Assessment & Plan:

## 2011-07-18 NOTE — Progress Notes (Signed)
PFT done today. 

## 2011-07-22 ENCOUNTER — Telehealth: Payer: Self-pay | Admitting: Internal Medicine

## 2011-07-22 NOTE — Assessment & Plan Note (Signed)
cough is due to acid  Reflux, sinus, and possibly sarcoid though currently sarcoid is very stable and not the main reason for cough #GERD  Acid reflux  - continue zegerid 40mg -1100 daily  - PLEASE continue over the counter ranitidine 300mg  po at bed time  - follow diet sheet strictly  - do not eat for 3h before going to bed and sleep with head end of bed elevated #SINUS  - continue netti pot - if this is not helping, you can stop it  - continue nasal steroid  -  Follow advise of Dr [name]  -you have a bit of sinus infection currently: take doxycycline 100mg  po twice daily after meals x 7 days; avoid sunligh #Cyclical cough  - continue gabapentin 300mg  three times daily - can cut down to 2 times daily if making you sleepy - when you have urge to cough or clear throat drink water or suck on lozenges  - #SARCOID - stable on breathing tests  - continue prednisone at 5mg  per day - -#SHORTNESS OF BREATH  - continue rehab   #FOLLOWUP  - - return in 2-3 months  - I will ask  Dr Sherene Sires one of my office colleagues just to make sure we are on the right track if he is willing to see you at followup

## 2011-07-22 NOTE — Telephone Encounter (Signed)
Jen: Pls give fu with Dr Sherene Sires to get second opinion on cough.   Kathlene November: thanks for seeing him. Let me know what you think

## 2011-07-22 NOTE — Assessment & Plan Note (Signed)
Stable disease. PFT near normal  Plan Continue prednisone

## 2011-07-30 NOTE — Telephone Encounter (Signed)
[email or web address] Mcmartin, CMA  

## 2011-08-01 NOTE — Telephone Encounter (Signed)
PT RETURNED CALL. CALL HIM AT WORK [phone]. Hazel Sams

## 2011-08-02 ENCOUNTER — Encounter: Payer: Self-pay | Admitting: Internal Medicine

## 2011-08-02 NOTE — Telephone Encounter (Signed)
Pt is set to see MW on 09-17-11. Carron Curie, CMA

## 2011-09-06 ENCOUNTER — Telehealth: Payer: Self-pay | Admitting: *Deleted

## 2011-09-06 NOTE — Telephone Encounter (Signed)
MR, he has a f/u appt with Dr. Sherene Sires on 09/17/11. Does he need a f/u appt with you as well? Please advise.  Thanks.

## 2011-09-06 NOTE — Telephone Encounter (Signed)
Received refill request from [facility] for pt's gabapentin 300 mg capsules #90 day supply. Last OV 07/18/11. Please advise if okay to refill Dr. [name], thanks

## 2011-09-06 NOTE — Telephone Encounter (Signed)
Dr Sherene Sires is 2nd opinioin for cough . HE will ultimately come back to me. For now just stick with [facility]

## 2011-09-06 NOTE — Telephone Encounter (Signed)
pls confirm with patient he is taking it for cough and then if so, yes ok to send.MAke sure he is fu with me per prior OV note

## 2011-09-07 MED ORDER — GABAPENTIN 300 MG PO CAPS: ORAL_CAPSULE | ORAL | Status: DC

## 2011-09-07 NOTE — Telephone Encounter (Signed)
Pt is taking gabapentin  For his cough and will need a refill sent to his pharmacy. Pt also rescheduled his appt with Mw to 09/21/11 @ 9:15am. Pt will then f/u with MR and will call back to schedule this or make appt when here for ov with Mw.

## 2011-09-14 ENCOUNTER — Other Ambulatory Visit: Payer: Self-pay | Admitting: *Deleted

## 2011-09-14 MED ORDER — FLUTICASONE PROPIONATE 50 MCG/ACT NA SUSP: 2.0000 | Freq: Every day | NASAL | Status: DC

## 2011-09-14 NOTE — Telephone Encounter (Signed)
RX sent

## 2011-09-17 ENCOUNTER — Ambulatory Visit: Payer: BC Managed Care – PPO | Admitting: Internal Medicine

## 2011-09-18 HISTORY — PX: NASAL SINUS SURGERY: SHX719

## 2011-09-21 ENCOUNTER — Ambulatory Visit: Payer: BC Managed Care – PPO | Admitting: Internal Medicine

## 2011-10-10 ENCOUNTER — Other Ambulatory Visit: Payer: Self-pay | Admitting: *Deleted

## 2011-10-10 MED ORDER — GABAPENTIN 300 MG PO CAPS: ORAL_CAPSULE | ORAL | Status: DC

## 2011-10-11 ENCOUNTER — Ambulatory Visit (INDEPENDENT_AMBULATORY_CARE_PROVIDER_SITE_OTHER): Payer: 59 | Admitting: Internal Medicine

## 2011-10-11 ENCOUNTER — Telehealth: Payer: Self-pay | Admitting: Internal Medicine

## 2011-10-11 ENCOUNTER — Ambulatory Visit (INDEPENDENT_AMBULATORY_CARE_PROVIDER_SITE_OTHER)
Admission: RE | Admit: 2011-10-11 | Discharge: 2011-10-11 | Disposition: A | Discharge status: Home / Self Care | Payer: 59 | Source: Ambulatory Visit | Attending: Internal Medicine | Admitting: Internal Medicine

## 2011-10-11 ENCOUNTER — Encounter: Payer: Self-pay | Admitting: Internal Medicine

## 2011-10-11 VITALS — BP 118/66 | HR 62 | Temp 98.0°F | Ht 72.0 in | Wt 180.6 lb

## 2011-10-11 DIAGNOSIS — R05 Cough: Secondary | ICD-10-CM

## 2011-10-11 DIAGNOSIS — D869 Sarcoidosis, unspecified: Secondary | ICD-10-CM

## 2011-10-11 DIAGNOSIS — R059 Cough, unspecified: Secondary | ICD-10-CM

## 2011-10-11 MED ORDER — RANITIDINE HCL 300 MG PO TABS: 300.0000 mg | ORAL_TABLET | Freq: Every day | ORAL | Status: DC

## 2011-10-11 MED ORDER — GABAPENTIN 300 MG PO CAPS: ORAL_CAPSULE | ORAL | Status: DC

## 2011-10-11 MED ORDER — OMEPRAZOLE 40 MG PO CPDR: 40.0000 mg | DELAYED_RELEASE_CAPSULE | Freq: Every day | ORAL | Status: DC

## 2011-10-11 MED ORDER — PREDNISONE 5 MG PO TABS: 5.0000 mg | ORAL_TABLET | Freq: Every day | ORAL | Status: DC

## 2011-10-11 MED ORDER — FLUTICASONE PROPIONATE 50 MCG/ACT NA SUSP: 2.0000 | Freq: Every day | NASAL | Status: DC

## 2011-10-11 NOTE — Patient Instructions (Addendum)
Continue omeprazole before bfast every day plus Zantac 150  mg 2  bedtime automatically whether you think you need it or not  GERD (REFLUX)  is an extremely common cause of respiratory symptoms, many times with no significant heartburn at all.    It can be treated with medication, but also with lifestyle changes including avoidance of late meals, excessive alcohol, smoking cessation, and avoid fatty foods, chocolate, peppermint, colas, red wine, and acidic juices such as orange juice.  NO MINT OR MENTHOL PRODUCTS SO NO COUGH DROPS  USE SUGARLESS CANDY INSTEAD (jolley ranchers or Stover's)  NO OIL BASED VITAMINS - use powdered substitutes.   Please remember to go to the x-ray department downstairs for your tests - we will call you with the results when they are available.  If cough has improved to your satisfaction stop neurontin to see if it gets worse,  If so you need to return here in a month.      LATE ADD :  Next step is repeat sinus ct or ent eval

## 2011-10-11 NOTE — Telephone Encounter (Signed)
I spoke with patient about results and he verbalized understanding and had no questions 

## 2011-10-11 NOTE — Progress Notes (Signed)
Subjective:    Patient ID: Philip Hernandez, male    DOB: 1956/04/04    MRN: [id number]  HPI  Brief patient profile:  60 yowm never smoker followed by Philip Hernandez for Sarcoid and referred 10/11/11  for second opinion re cough to Philip Hernandez  Ov 12/18/2010:Philip Hernandez:  Followup Stage 2 Pulmonary sarcoid (Also neck nodes, splenic granuloma), cough (due to sarcoid, sinus, and gerd) and class 2-3 dyspnea (due to sarcoid and deconditioning). At last office visit we deemed that sinus and gerd were more important contributors to cough than sarcoid. With active sinus and gerd control, cough is nearly gone. HE is compliant with instructions but he and wife are frustrated with rigidity of diet and want to splurge. Dyspnea persists unchanged and he wants this improved. Taking prednisone 20mg  per day. No side effcts. PFTs 12/04/2010 show mild obstruction on spiro (fev1 2.8L/76%, ratioo 69, normal DLCO but restriction on lung volume - TLC 79%). REC: GERD contril with diet and zegerid. Sinus control with netti pot and nasl steroid. Sarcoid - cut prednisone to 10mg  per day. Dyspnea - refer rehab.   OV June 2011: Philip Hernandez:  Cc cough has improved > Residual cough is mild and is clearly related to GERD. States that zegerid alone is not cutting it. Finds that he has to be diligent with diet. IF diet is violated, he coughs.  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 Only some residual sinus drainage present - netti pot helps. In addition, he admits today to some elements of habit cough (clears throat perioidically and feels tickle down his throat). IN terms of dyspnea, this is somewhat better. Started rehab 2.5 weeks ago and feels he is not being pushed hard enough. In terms of sarcoid, continues at prednisone 10mg  per day without problems.  REC: 0. COUGH - mostly due to GERD, SINUS and LPR and less due to SARCOID 1. GERD CONTROL - zegerid + start ranitidine + diet sheet + sleep with hob elevated  2.  SINUS - netti pot and saline wash 3. CYCLICAL COUGH - 3 days of silence and distraction techniques 4. SARCOID  - continue prednisone at 10mg  per day.   OV 06/04/11: Philip Hernandez cc  Cough is worse. RSO Socre 28. Reportes he followed all of above instructions from last visit. REports mild hoarsness, mod-sever clearing of throat, post nasal drop, choking episodes, feeling of lump in throat and moderate feelings of dysphagia, heartburn and cough worse after lying down. He had had GI and Hernandez eval per hx in past without showing anything anatomic in throat but he is  Convinced he has something in throat. He is tolerting prednisone 5mg  okay. Spiro fev1 2.7L/68%, fvc 3.7L/73% is unchanged and cxr for sarcoid is unchanged too (though radioloigiust thought is a bit worse).     Cough is due to acid  Reflux, sinus, (combined called LPR cough, score of 28 suggests this) and possibly sarcoid though I think currently sarcoid is stable and not the main reason for cough though your cough got worse after we cut prednisone down #GERD  Acid reflux  - continue zegerid 40mg -1100 daily  - PLEASE continue over the counter ranitidine 300mg   at bed time  - follow diet sheet strictly  - do not eat for 3h before going to bed and sleep with head end of bed elevated #SINUS  - continue netti pot - if this is not helping, you can stop it  - continue nasal steroid  -  Refer to Philip Hernandez for sinus eval #Cyclical cough  - start gabapentin 300mg  daily x 5 days, then 300mg  twice daily x 5 days, then 300mg  three times daily to continue - when you have urge to cough or clear throat drink water or suck on lozenges   - #SARCOID - stable on breathing tests  - continue prednisone at 5mg  per day - - have spirometry and cxr in  6-8 weeks and we can reassess if sarcoid contributing to symptoms   #SHORTNESS OF BREATH  - continue rehab   #FOLLOWUP  - 6 weeks with full PFT  - come sooner if needed  OV 07/18/11: Philip Hernandez cc  RSI  cough score is 17 and improved from 28 at last visit. He is satisfied with cough improvement but still has cough. Says he saw Hernandez 07/04/11: per hx CT showed left maxillary sinusitis and was given abx for 10 days. Option of surgery discussed but for now only clinical monitoring. Says nose is still clogged up and worse now after [name] 3 days ago. Feels head is swollen. Taking gabapentin as well: thinks this is helping though makes him a bit sleey.  PFT today is near normal rec Cough is due to acid  Reflux, sinus, and possibly sarcoid though currently sarcoid is very stable and not the main reason for cough #GERD  Acid reflux  - continue zegerid 40mg -1100 daily  - PLEASE continue over the counter ranitidine 300mg  po at bed time  - follow diet sheet strictly  - do not eat for 3h before going to bed and sleep with head end of bed elevated #SINUS  - continue netti pot - if this is not helping, you can stop it  - continue nasal steroid  -  Follow advise of Philip Hernandez  -you have a bit of sinus infection currently: take doxycycline 100mg  po twice daily after meals x 7 days; avoid sunligh #Cyclical cough  - continue gabapentin 300mg  three times daily - can cut down to 2 times daily if making you sleepy - when you have urge to cough or clear throat drink water or suck on lozenges  - #SARCOID - stable on breathing tests  - continue prednisone at 5mg  per day - -#SHORTNESS OF BREATH  - continue rehab    10/11/2011 Pulmonary/ f/u ov cc 3 days after last course of abx cough much better but not completely gone> still urge to clear throat but not present first thing in am but am nasal congestion slt yellow taking zantac 300 mg prn and still on prednisone 5 mg daily for sarcoid. No change mild doe  Sleeping ok without nocturnal  or early am exacerbation  of respiratory  c/o's or need for noct saba. Also denies any obvious fluctuation of symptoms with weather or environmental changes or other  aggravating or alleviating factors except as outlined above   ROS:  At present neg for  any significant sore throat, dysphagia, itching, sneezing,  nasal congestion or excess/ purulent secretions,  fever, chills, sweats, unintended wt loss, pleuritic or exertional cp, hempoptysis, orthopnea pnd or leg swelling.       Objective:   amb wm unusual affect, did not answer any questions in a direct manner  Wt 180 10/11/2011   HEENT: nl dentition, turbinates, and orophanx. Nl external ear canals without cough reflex   NECK :  without JVD/Nodes/TM/ nl carotid upstrokes bilaterally   LUNGS: no acc muscle use, clear to A and P bilaterally without cough  on insp or exp maneuvers   CV:  RRR  no s3 or murmur or increase in P2, no edema   ABD:  soft and nontender with nl excursion in the supine position. No bruits or organomegaly, bowel sounds nl  MS:  warm without deformities, calf tenderness, cyanosis or clubbing  SKIN: warm and dry without lesions    NEURO:  alert, approp, no deficits     CXR  10/11/2011 :  1. No significant change in pulmonary parenchymal sarcoid.      Assessment & Plan:

## 2011-10-14 NOTE — Assessment & Plan Note (Addendum)
New onset Feb 2011.  Resolved with Rx for new dx of sarcoid in Dec 2011 Recurred in early jan 2012 when prednisone dose decreased  Feb 2012: cough deemed due to sinus, gerd issues and sarcoid. Rx for sinus and gerd started. Prednisone decreased to 20mg  per day April 2012: cough nearly resolved June 2012L cough clearly related to GERD. Some element of Cyclical cough + Aug 2012: cough improved but still +. GERD/LPR issues predominate. Kouffman RSI score 18.5 c/w  LPR Sept 2012: cough worse. Kouffman RSI score 15 Jul 2011: RSI score is 13  The most common causes of chronic cough in immunocompetent adults include the following: upper airway cough syndrome (UACS), previously referred to as postnasal drip syndrome (PNDS), which is caused by variety of rhinosinus conditions; (2) asthma; (3) GERD; (4) chronic bronchitis from cigarette smoking or other inhaled environmental irritants; (5) nonasthmatic eosinophilic bronchitis; and (6) bronchiectasis.   These conditions, singly or in combination, have accounted for up to 94% of the causes of chronic cough in prospective studies.   Other conditions have constituted no >6% of the causes in prospective studies These have included bronchogenic carcinoma, chronic interstitial pneumonia, sarcoidosis, left ventricular failure, ACEI-induced cough, and aspiration from a condition associated with pharyngeal dysfunction.   .Chronic cough is often simultaneously caused by more than one condition. A single cause has been found from 38 to 82% of the time, multiple causes from 18 to 62%. Multiply caused cough has been the result of three diseases up to 42% of the time.    I agree with Dr [name] that Symptoms are markedly disproportionate to objective findings and not clear this is a lung problem but pt does appear to have difficult airway management issues.  Most likely this will prove to be a form of   Classic Upper airway cough syndrome, so named because it's  frequently impossible to sort out how much is  CR/sinusitis with freq throat clearing (which can be related to primary GERD)   vs  causing  secondary (" extra esophageal")  GERD from wide swings in gastric pressure that occur with throat clearing, often  promoting self use of mint and menthol lozenges that reduce the lower esophageal sphincter tone and exacerbate the problem further in a cyclical fashion.   These are the same pts who not infrequently have failed to tolerate ace inhibitors,  dry powder inhalers or biphosphonates or report having reflux symptoms that don't respond to standard doses of PPI , and are easily confused as having aecopd or asthma flares,   For now rec continue max rx for GERD then consider repeat sinus ct or ENT re-eval as next step - discussed with pt the following: Unlike when you get a prescription for eyeglasses, it's not possible to always walk out of this or any medical office with a perfect prescription that is immediately effective  based on any test that we offer here.    On the contrary, it may take several weeks for the full impact of changes recommened today - hopefully you will respond well.  If not, then we'll adjust your medication on your next visit accordingly, knowing more then than we can possibly know now.

## 2011-10-14 NOTE — Assessment & Plan Note (Signed)
The goal with a chronic steroid dependent illness is always arriving at the lowest effective dose that controls the disease/symptoms and not accepting a set "formula" which is based on statistics or guidelines that don't always take into account patient  variability or the natural hx of the dz in every individual patient, which may well vary over time.  For now therefore I recommend the patient maintain  A floor of 5 mg per day until next ov - no def evidence of dz activity but now at near physiologic levels and taper furhter to off may be difficult while still fighting active cough issues in terms of being sure sarcoid is not contributing.

## 2011-11-12 ENCOUNTER — Ambulatory Visit (INDEPENDENT_AMBULATORY_CARE_PROVIDER_SITE_OTHER): Payer: 59 | Admitting: Internal Medicine

## 2011-11-12 ENCOUNTER — Encounter: Payer: Self-pay | Admitting: Internal Medicine

## 2011-11-12 VITALS — BP 112/70 | HR 64 | Temp 97.9°F | Ht 72.0 in | Wt 179.8 lb

## 2011-11-12 DIAGNOSIS — R059 Cough, unspecified: Secondary | ICD-10-CM

## 2011-11-12 DIAGNOSIS — R05 Cough: Secondary | ICD-10-CM

## 2011-11-12 DIAGNOSIS — D869 Sarcoidosis, unspecified: Secondary | ICD-10-CM

## 2011-11-12 MED ORDER — AMOXICILLIN-POT CLAVULANATE 875-125 MG PO TABS: 1.0000 | ORAL_TABLET | Freq: Two times a day (BID) | ORAL | Status: AC

## 2011-11-12 MED ORDER — PREDNISONE 5 MG PO TABS: 5.0000 mg | ORAL_TABLET | Freq: Every day | ORAL | Status: DC

## 2011-11-12 NOTE — Patient Instructions (Addendum)
Augmentin 875 one twice daily x 10 days and then CT Sinus needs to be done  Please see patient coordinator before you leave today  to schedule Sinus CT  Prednisone increase to 20 mg per day until better - then reduce 10mg  per day.  GERD (REFLUX)  is an extremely common cause of respiratory symptoms, many times with no significant heartburn at all.    It can be treated with medication, but also with lifestyle changes including avoidance of late meals, excessive alcohol, smoking cessation, and avoid fatty foods, chocolate, peppermint, colas, red wine, and acidic juices such as orange juice.  NO MINT OR MENTHOL PRODUCTS SO NO COUGH DROPS  USE SUGARLESS CANDY INSTEAD (jolley ranchers or Stover[employer])  NO OIL BASED VITAMINS - use powdered substitutes.    Please schedule a follow up office visit in 4 weeks, sooner if needed   Late add next step is add h1 if sinus ct neg, if pos return to ent for sinus dz and throat clearing

## 2011-11-12 NOTE — Progress Notes (Signed)
Subjective:    Patient ID: Philip Hernandez, male    DOB: [birth year]    MRN: [id number]  HPI  Brief patient profile:  37 yowm never smoker followed by Philip Hernandez for Sarcoid and referred 10/11/11  for second opinion re cough to Philip Hernandez  Ov 12/18/2010:Philip Hernandez:  Followup Stage 2 Pulmonary sarcoid (Also neck nodes, splenic granuloma), cough (due to sarcoid, sinus, and gerd) and class 2-3 dyspnea (due to sarcoid and deconditioning). At last office visit we deemed that sinus and gerd were more important contributors to cough than sarcoid. With active sinus and gerd control, cough is nearly gone. HE is compliant with instructions but he and wife are frustrated with rigidity of diet and want to splurge. Dyspnea persists unchanged and he wants this improved. Taking prednisone 20mg  per day. No side effcts. PFTs 12/04/2010 show mild obstruction on spiro (fev1 2.8L/76%, ratioo 69, normal DLCO but restriction on lung volume - TLC 79%). REC: GERD contril with diet and zegerid. Sinus control with netti pot and nasl steroid. Sarcoid - cut prednisone to 10mg  per day. Dyspnea - refer rehab.   OV June 2011: Philip Hernandez:  Cc cough has improved > Residual cough is mild and is clearly related to GERD. States that zegerid alone is not cutting it. Finds that he has to be diligent with diet. IF diet is violated, he coughs.  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 Only some residual sinus drainage present - netti pot helps. In addition, he admits today to some elements of habit cough (clears throat perioidically and feels tickle down his throat). IN terms of dyspnea, this is somewhat better. Started rehab 2.5 weeks ago and feels he is not being pushed hard enough. In terms of sarcoid, continues at prednisone 10mg  per day without problems.  REC: 0. COUGH - mostly due to GERD, SINUS and LPR and less due to SARCOID 1. GERD CONTROL - zegerid + start ranitidine + diet sheet + sleep with hob elevated  2.  SINUS - netti pot and saline wash 3. CYCLICAL COUGH - 3 days of silence and distraction techniques 4. SARCOID  - continue prednisone at 10mg  per day.   OV 06/04/11: Philip Hernandez cc  Cough is worse. RSO Socre 28. Reportes he followed all of above instructions from last visit. REports mild hoarsness, mod-sever clearing of throat, post nasal drop, choking episodes, feeling of lump in throat and moderate feelings of dysphagia, heartburn and cough worse after lying down. He had had GI and ENT eval per hx in past without showing anything anatomic in throat but he is  Convinced he has something in throat. He is tolerting prednisone 5mg  okay. Spiro fev1 2.7L/68%, fvc 3.7L/73% is unchanged and cxr for sarcoid is unchanged too (though radioloigiust thought is a bit worse).     Cough is due to acid  Reflux, sinus, (combined called LPR cough, score of 28 suggests this) and possibly sarcoid though I think currently sarcoid is stable and not the main reason for cough though your cough got worse after we cut prednisone down #GERD  Acid reflux  - continue zegerid 40mg -1100 daily  - PLEASE continue over the counter ranitidine 300mg   at bed time  - follow diet sheet strictly  - do not eat for 3h before going to bed and sleep with head end of bed elevated #SINUS  - continue netti pot - if this is not helping, you can stop it  - continue nasal steroid  -  Refer to Philip Hernandez ENT for sinus eval #Cyclical cough  - start gabapentin 300mg  daily x 5 days, then 300mg  twice daily x 5 days, then 300mg  three times daily to continue - when you have urge to cough or clear throat drink water or suck on lozenges   - #SARCOID - stable on breathing tests  - continue prednisone at 5mg  per day - - have spirometry and cxr in  6-8 weeks and we can reassess if sarcoid contributing to symptoms   #SHORTNESS OF BREATH  - continue rehab      OV 07/18/11: Philip Hernandez cc  RSI cough score is 17 and improved from 28 at last visit. He is  satisfied with cough improvement but still has cough. Says he saw ENT 07/04/11: per hx CT showed left maxillary sinusitis and was given abx for 10 days. Option of surgery discussed but for now only clinical monitoring. Says nose is still clogged up and worse now after [name] 3 days ago. Feels head is swollen. Taking gabapentin as well: thinks this is helping though makes him a bit sleey.  PFT today is near normal rec Cough is due to acid  Reflux, sinus, and possibly sarcoid though currently sarcoid is very stable and not the main reason for cough #GERD  Acid reflux  - continue zegerid 40mg -1100 daily  - PLEASE continue over the counter ranitidine 300mg  po at bed time  - follow diet sheet strictly  - do not eat for 3h before going to bed and sleep with head end of bed elevated #SINUS  - continue netti pot - if this is not helping, you can stop it  - continue nasal steroid  -  Follow advise of Dr Philip Hernandez  -you have a bit of sinus infection currently: take doxycycline 100mg  po twice daily after meals x 7 days; avoid sunligh #Cyclical cough  - continue gabapentin 300mg  three times daily - can cut down to 2 times daily if making you sleepy - when you have urge to cough or clear throat drink water or suck on lozenges  - #SARCOID - stable on breathing tests  - continue prednisone at 5mg  per day - -#SHORTNESS OF BREATH  - continue rehab    10/11/2011 Pulmonary/ Philip Hernandez/ first eval:   f/u ov cc 3 days after last course of abx cough much better but not completely gone> still urge to clear throat but not present first thing in am but am nasal congestion slt yellow taking zantac 300 mg prn and still on prednisone 5 mg daily for sarcoid. No change mild doe rec Continue omeprazole before bfast every day plus Zantac 150  mg 2  bedtime automatically whether you think you need it or not GERD diet reviewed  If cough has improved to your satisfaction stop neurontin to see if it gets worse,  If so you  need to return here in a month.     11/12/2011 f/u ov/Philip Hernandez cc no real change off neurontin in terms of cough then much worse cough x 1 weeks assoc with flare of "sinus" symptoms "due to a cold" with nasal congestion and yellow mucus esp in am.  No change chronic doe on pred 5 mg per day.  Sleeping ok without nocturnal  or early am exacerbation  of respiratory  c/o's or need for noct saba. Also denies any obvious fluctuation of symptoms with weather or environmental changes or other aggravating or alleviating factors except as outlined above   ROS:  At present  neg for  any significant sore throat, dysphagia, itching, sneezing,  nasal congestion or excess/ purulent secretions,  fever, chills, sweats, unintended wt loss, pleuritic or exertional cp, hempoptysis, orthopnea pnd or leg swelling.       Objective:   amb wm unusual affect, did not answer any questions in a direct manner  Wt 180 10/11/2011  > 179 11/12/2011   HEENT: nl dentition, turbinates, and orophanx. Nl external ear canals without cough reflex   NECK :  without JVD/Nodes/TM/ nl carotid upstrokes bilaterally   LUNGS: no acc muscle use, clear to A and P bilaterally without cough on insp or exp maneuvers   CV:  RRR  no s3 or murmur or increase in P2, no edema   ABD:  soft and nontender with nl excursion in the supine position. No bruits or organomegaly, bowel sounds nl  MS:  warm without deformities, calf tenderness, cyanosis or clubbing  SKIN: warm and dry without lesions / rash  N      CXR  10/11/2011 :  1. No significant change in pulmonary parenchymal sarcoid.      Assessment & Plan:

## 2011-11-12 NOTE — Assessment & Plan Note (Signed)
The most common causes of chronic cough in immunocompetent adults include the following: upper airway cough syndrome (UACS), previously referred to as postnasal drip syndrome (PNDS), which is caused by variety of rhinosinus conditions; (2) asthma; (3) GERD; (4) chronic bronchitis from cigarette smoking or other inhaled environmental irritants; (5) nonasthmatic eosinophilic bronchitis; and (6) bronchiectasis.   These conditions, singly or in combination, have accounted for up to 94% of the causes of chronic cough in prospective studies.   Other conditions have constituted no >6% of the causes in prospective studies These have included bronchogenic carcinoma, chronic interstitial pneumonia, sarcoidosis, left ventricular failure, ACEI-induced cough, and aspiration from a condition associated with pharyngeal dysfunction.   .Chronic cough is often simultaneously caused by more than one condition. A single cause has been found from 38 to 82% of the time, multiple causes from 18 to 62%. Multiply caused cough has been the result of three diseases up to 42% of the time.    Continue to feel this is  Classic Upper airway cough syndrome, so named because it's frequently impossible to sort out how much is  CR/sinusitis with freq throat clearing (which can be related to primary GERD)   vs  causing  secondary (" extra esophageal")  GERD from wide swings in gastric pressure that occur with throat clearing, often  promoting self use of mint and menthol lozenges that reduce the lower esophageal sphincter tone and exacerbate the problem further in a cyclical fashion.   These are the same pts (now being labeled as having "irritable larynx syndrome" by some cough centers) who not infrequently have a history of having failed to tolerate ace inhibitors,  dry powder inhalers or biphosphonates or report having atypical reflux symptoms that don't respond to standard doses of PPI , and are easily confused as having aecopd or asthma  flares by even experienced allergists/ pulmonologists.  Will rx with 10 days of augmentin then do sinus ct and if positive refer back to [facility], if neg add 1st gen H1 per guidlines

## 2011-11-12 NOTE — Assessment & Plan Note (Signed)
The goal with a chronic steroid dependent illness is always arriving at the lowest effective dose that controls the disease/symptoms and not accepting a set "formula" which is based on statistics or guidelines that don't always take into account patient  variability or the natural hx of the dz in every individual patient, which may well vary over time.  For now therefore I recommend the patient maintain  A ceiling of 20 mg per day since coughing and taper to 10 mg per day by f/u in one month in case there is sarcoid involvement of sinuses or airway fueling the cough

## 2011-11-23 ENCOUNTER — Telehealth: Payer: Self-pay | Admitting: Internal Medicine

## 2011-11-23 MED ORDER — LEVOFLOXACIN 750 MG PO TABS: 750.0000 mg | ORAL_TABLET | Freq: Every day | ORAL | Status: AC

## 2011-11-23 MED ORDER — TRAMADOL HCL 50 MG PO TABS: ORAL_TABLET | ORAL | Status: AC

## 2011-11-23 NOTE — Telephone Encounter (Signed)
Called and spoke with pt and he stated that he finished the amoxicillin today---is scheduled for ct sinus next Tuesday.  Still not any better.  Yellow nasal congestion, chest congestion that is clear.  Pt is wanting to know is there something else that MW can give him to make him feel some better.  MW please advise.  thanks  No Known Allergies

## 2011-11-23 NOTE — Telephone Encounter (Signed)
If not better at all doesn't make sense to use any more augmentin. If having discolored mucus rec levaquin 750 x 5 days, if not discolored mucus take none  If the problem is pain or cough use tramadol 50 mg up to 2 every 4 hours #40 pending sinus ct results

## 2011-11-23 NOTE — Telephone Encounter (Signed)
Patient is aware of MW recs and prescriptions have been sent to his pharmacy.

## 2011-11-27 ENCOUNTER — Encounter: Payer: Self-pay | Admitting: Internal Medicine

## 2011-11-27 ENCOUNTER — Ambulatory Visit (INDEPENDENT_AMBULATORY_CARE_PROVIDER_SITE_OTHER)
Admission: RE | Admit: 2011-11-27 | Discharge: 2011-11-27 | Disposition: A | Discharge status: Home / Self Care | Payer: BC Managed Care – PPO | Source: Ambulatory Visit | Attending: Internal Medicine | Admitting: Internal Medicine

## 2011-11-27 DIAGNOSIS — R059 Cough, unspecified: Secondary | ICD-10-CM

## 2011-11-27 DIAGNOSIS — R05 Cough: Secondary | ICD-10-CM

## 2011-11-27 NOTE — Progress Notes (Signed)
Quick Note:  Spoke with pt and notified of results per Dr. Wert. Pt verbalized understanding and denied any questions.  ______ 

## 2011-12-10 ENCOUNTER — Other Ambulatory Visit (INDEPENDENT_AMBULATORY_CARE_PROVIDER_SITE_OTHER): Payer: BC Managed Care – PPO

## 2011-12-10 ENCOUNTER — Encounter: Payer: Self-pay | Admitting: Internal Medicine

## 2011-12-10 ENCOUNTER — Ambulatory Visit (INDEPENDENT_AMBULATORY_CARE_PROVIDER_SITE_OTHER)
Admission: RE | Admit: 2011-12-10 | Discharge: 2011-12-10 | Disposition: A | Discharge status: Home / Self Care | Payer: BC Managed Care – PPO | Source: Ambulatory Visit | Attending: Internal Medicine | Admitting: Internal Medicine

## 2011-12-10 ENCOUNTER — Ambulatory Visit (INDEPENDENT_AMBULATORY_CARE_PROVIDER_SITE_OTHER): Payer: BC Managed Care – PPO | Admitting: Internal Medicine

## 2011-12-10 VITALS — BP 94/60 | HR 79 | Temp 98.2°F | Ht 72.0 in | Wt 172.8 lb

## 2011-12-10 DIAGNOSIS — D869 Sarcoidosis, unspecified: Secondary | ICD-10-CM

## 2011-12-10 DIAGNOSIS — R059 Cough, unspecified: Secondary | ICD-10-CM

## 2011-12-10 DIAGNOSIS — R05 Cough: Secondary | ICD-10-CM

## 2011-12-10 DIAGNOSIS — R0602 Shortness of breath: Secondary | ICD-10-CM

## 2011-12-10 LAB — CBC WITH DIFFERENTIAL/PLATELET
Basophils Absolute: 0 K/uL (ref 0.0–0.1)
Basophils Relative: 0.2 % (ref 0.0–3.0)
Eosinophils Absolute: 0 K/uL (ref 0.0–0.7)
Eosinophils Relative: 0.1 % (ref 0.0–5.0)
HCT: 36.8 % — ABNORMAL LOW (ref 39.0–52.0)
Hemoglobin: 12 g/dL — ABNORMAL LOW (ref 13.0–17.0)
Lymphocytes Relative: 56.9 % — ABNORMAL HIGH (ref 12.0–46.0)
Lymphs Abs: 6 K/uL — ABNORMAL HIGH (ref 0.7–4.0)
MCHC: 32.6 g/dL (ref 30.0–36.0)
MCV: 84 fl (ref 78.0–100.0)
Monocytes Absolute: 0.9 K/uL (ref 0.1–1.0)
Monocytes Relative: 8.5 % (ref 3.0–12.0)
Neutro Abs: 3.6 K/uL (ref 1.4–7.7)
Neutrophils Relative %: 34.3 % — ABNORMAL LOW (ref 43.0–77.0)
Platelets: 153 K/uL (ref 150.0–400.0)
RBC: 4.39 Mil/uL (ref 4.22–5.81)
RDW: 14.2 % (ref 11.5–14.6)
WBC: 10.5 K/uL (ref 4.5–10.5)

## 2011-12-10 LAB — BASIC METABOLIC PANEL WITH GFR
BUN: 15 mg/dL (ref 6–23)
CO2: 25 meq/L (ref 19–32)
Calcium: 8.5 mg/dL (ref 8.4–10.5)
Chloride: 105 meq/L (ref 96–112)
Creatinine, Ser: 1.1 mg/dL (ref 0.4–1.5)
GFR: 73.98 mL/min (ref >60.00)
Glucose, Bld: 95 mg/dL (ref 70–99)
Potassium: 3.5 meq/L (ref 3.5–5.1)
Sodium: 137 meq/L (ref 135–145)

## 2011-12-10 LAB — HEPATIC FUNCTION PANEL
ALT: 35 U/L (ref 0–53)
AST: 30 U/L (ref 0–37)
Albumin: 2.5 g/dL — ABNORMAL LOW (ref 3.5–5.2)
Alkaline Phosphatase: 46 U/L (ref 39–117)
Bilirubin, Direct: 0.1 mg/dL (ref 0.0–0.3)
Total Bilirubin: 0.4 mg/dL (ref 0.3–1.2)
Total Protein: 5.7 g/dL — ABNORMAL LOW (ref 6.0–8.3)

## 2011-12-10 LAB — BRAIN NATRIURETIC PEPTIDE: Pro B Natriuretic peptide (BNP): 22 pg/mL (ref 0.0–100.0)

## 2011-12-10 LAB — SEDIMENTATION RATE: Sed Rate: 30 mm/h — ABNORMAL HIGH (ref 0–22)

## 2011-12-10 NOTE — Patient Instructions (Signed)
Prednisone 20 mg per through surgery, then 10 mg per day unless otherwise directed by ent   Please schedule a follow up office visit in 4 weeks, sooner if needed with me in [place] office (first Tuesday in May)  Please remember to go to the lab and x-ray department downstairs for your tests - we will call you with the results when they are available.

## 2011-12-10 NOTE — Progress Notes (Signed)
Subjective:    Patient ID: Philip Hernandez, male    DOB: 01/04/1956    MRN: [id number]  HPI  Brief patient profile:  66 yowm never smoker followed by [name] for Sarcoid and referred 10/11/11  for second opinion re cough to Lesley Galentine  Ov 12/18/2010:[name]:  Followup Stage 2 Pulmonary sarcoid (Also neck nodes, splenic granuloma), cough (due to sarcoid, sinus, and gerd) and class 2-3 dyspnea (due to sarcoid and deconditioning). At last office visit we deemed that sinus and gerd were more important contributors to cough than sarcoid. With active sinus and gerd control, cough is nearly gone. HE is compliant with instructions but he and wife are frustrated with rigidity of diet and want to splurge. Dyspnea persists unchanged and he wants this improved. Taking prednisone 20mg  per day. No side effcts. PFTs 12/04/2010 show mild obstruction on spiro (fev1 2.8L/76%, ratioo 69, normal DLCO but restriction on lung volume - TLC 79%). REC: GERD contril with diet and zegerid. Sinus control with netti pot and nasl steroid. Sarcoid - cut prednisone to 10mg  per day. Dyspnea - refer rehab.   OV June 2011: [name]:  Cc cough has improved > Residual cough is mild and is clearly related to GERD. States that zegerid alone is not cutting it. Finds that he has to be diligent with diet. IF diet is violated, he coughs.  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 Only some residual sinus drainage present - netti pot helps. In addition, he admits today to some elements of habit cough (clears throat perioidically and feels tickle down his throat). IN terms of dyspnea, this is somewhat better. Started rehab 2.5 weeks ago and feels he is not being pushed hard enough. In terms of sarcoid, continues at prednisone 10mg  per day without problems.  REC: 0. COUGH - mostly due to GERD, SINUS and LPR and less due to SARCOID 1. GERD CONTROL - zegerid + start ranitidine + diet sheet + sleep with hob elevated  2.  SINUS - netti pot and saline wash 3. CYCLICAL COUGH - 3 days of silence and distraction techniques 4. SARCOID  - continue prednisone at 10mg  per day.   OV 06/04/11: [name] cc  Cough is worse. RSO Socre 28. Reportes he followed all of above instructions from last visit. REports mild hoarsness, mod-sever clearing of throat, post nasal drop, choking episodes, feeling of lump in throat and moderate feelings of dysphagia, heartburn and cough worse after lying down. He had had GI and ENT eval per hx in past without showing anything anatomic in throat but he is  Convinced he has something in throat. He is tolerting prednisone 5mg  okay. Spiro fev1 2.7L/68%, fvc 3.7L/73% is unchanged and cxr for sarcoid is unchanged too (though radioloigiust thought is a bit worse).     Cough is due to acid  Reflux, sinus, (combined called LPR cough, score of 28 suggests this) and possibly sarcoid though I think currently sarcoid is stable and not the main reason for cough though your cough got worse after we cut prednisone down #GERD  Acid reflux  - continue zegerid 40mg -1100 daily  - PLEASE continue over the counter ranitidine 300mg   at bed time  - follow diet sheet strictly  - do not eat for 3h before going to bed and sleep with head end of bed elevated #SINUS  - continue netti pot - if this is not helping, you can stop it  - continue nasal steroid  -  Refer to [facility] for sinus eval #Cyclical cough  - start gabapentin 300mg  daily x 5 days, then 300mg  twice daily x 5 days, then 300mg  three times daily to continue - when you have urge to cough or clear throat drink water or suck on lozenges   - #SARCOID - stable on breathing tests  - continue prednisone at 5mg  per day - - have spirometry and cxr in  6-8 weeks and we can reassess if sarcoid contributing to symptoms   #SHORTNESS OF BREATH  - continue rehab      OV 07/18/11: [name] cc  RSI cough score is 17 and improved from 28 at last visit. He is  satisfied with cough improvement but still has cough. Says he saw ENT 07/04/11: per hx CT showed left maxillary sinusitis and was given abx for 10 days. Option of surgery discussed but for now only clinical monitoring. Says nose is still clogged up and worse now after [name] 3 days ago. Feels head is swollen. Taking gabapentin as well: thinks this is helping though makes him a bit sleey.  PFT today is near normal rec Cough is due to acid  Reflux, sinus, and possibly sarcoid though currently sarcoid is very stable and not the main reason for cough #GERD  Acid reflux  - continue zegerid 40mg -1100 daily  - PLEASE continue over the counter ranitidine 300mg  po at bed time  - follow diet sheet strictly  - do not eat for 3h before going to bed and sleep with head end of bed elevated #SINUS  - continue netti pot - if this is not helping, you can stop it  - continue nasal steroid  -  Follow advise of Dr [name]  -you have a bit of sinus infection currently: take doxycycline 100mg  po twice daily after meals x 7 days; avoid sunligh #Cyclical cough  - continue gabapentin 300mg  three times daily - can cut down to 2 times daily if making you sleepy - when you have urge to cough or clear throat drink water or suck on lozenges  - #SARCOID - stable on breathing tests  - continue prednisone at 5mg  per day - -#SHORTNESS OF BREATH  - continue rehab    10/11/2011 Pulmonary/ Pia Jedlicka/ first eval:   f/u ov cc 3 days after last course of abx cough much better but not completely gone> still urge to clear throat but not present first thing in am but am nasal congestion slt yellow taking zantac 300 mg prn and still on prednisone 5 mg daily for sarcoid. No change mild doe rec Continue omeprazole before bfast every day plus Zantac 150  mg 2  bedtime automatically whether you think you need it or not GERD diet reviewed  If cough has improved to your satisfaction stop neurontin to see if it gets worse,  If so you  need to return here in a month.     11/12/2011 f/u ov/Nyilah Kight cc no real change off neurontin in terms of cough then much worse cough x 1 weeks assoc with flare of "sinus" symptoms "due to a cold" with nasal congestion and yellow mucus esp in am.  No change chronic doe on pred 5 mg per day. rec Augmentin 875 one twice daily x 10 days and then CT Sinus needs to be done Please see patient coordinator before you leave today  to schedule Sinus CT> pos sinusitis> refer ent Prednisone increase to 20 mg per day until better - then reduce 10mg  per  day   12/10/2011 f/u ov/Nyia Tsao stayed on prednisone at 20 mg per day with no improvement in any symptoms but missed the dose  Am of ov c/o persistent cough worse at hs mostly mucoid sputum sev tsp a day and severe malaise, some nausea, no real limiting sob. No arthralgia or ocular complaints or rash. Since prev ov eval by ent > rec sinus surgery 3/28  Sleeping ok without nocturnal  or early am exacerbation  of respiratory  c/o's or need for noct saba. Also denies any obvious fluctuation of symptoms with weather or environmental changes or other aggravating or alleviating factors except as outlined above   ROS:  At present neg for  any significant sore throat, dysphagia, itching, sneezing,    fever, chills, sweats, unintended wt loss, pleuritic or exertional cp, hempoptysis, orthopnea pnd or leg swelling.       Objective:   amb wm unusual affect, did not answer any questions in a direct manner  Wt 180 10/11/2011  > 179 11/12/2011 > 172 12/10/2011   HEENT: nl dentition, turbinates, and orophanx. Nl external ear canals without cough reflex   NECK :  without JVD/Nodes/TM/ nl carotid upstrokes bilaterally   LUNGS: no acc muscle use, clear to A and P bilaterally without cough on insp or exp maneuvers   CV:  RRR  no s3 or murmur or increase in P2, no edema   ABD:  soft and nontender with nl excursion in the supine position. No bruits or organomegaly, bowel sounds  nl  MS:  warm without deformities, calf tenderness, cyanosis or clubbing  SKIN: warm and dry without lesions / rash   CXR  12/10/2011 : Mild progression of reticulonodular density in the lungs since 10/11/2011 could reflect progression of pulmonary sarcoid or superimposed acute pulmonary infection.              Assessment & Plan:

## 2011-12-11 NOTE — Progress Notes (Signed)
Quick Note:  Spoke with pt and notified of results per Dr. Wert. Pt verbalized understanding and denied any questions.  ______ 

## 2011-12-12 NOTE — Assessment & Plan Note (Signed)
cxr suggests sarcoid may have progressed but this is extremely unlikely if the patient is taking the 20 mg per day he says he is.  For now will focus on the sinus dz then regroup p sinus surgery planned for 12/13/11

## 2011-12-12 NOTE — Assessment & Plan Note (Signed)
New onset Feb 2011.  Resolved with Rx for new dx of sarcoid in Dec 2011 Recurred in early jan 2012 when prednisone dose decreased  Feb 2012: cough deemed due to sinus, gerd issues and sarcoid. Rx for sinus and gerd started. Prednisone decreased to 20mg  per day  Aug 2012: cough improved but still +. GERD/LPR issues predominate. Kouffman RSI score 18.5 c/w  LPR Sept 2012: cough worse. Kouffman RSI score 15 Jul 2011: RSI score is 13  - Sinus CT p 10 days augmentin 11/27/2011 > Acute on chronic paranasal sinusitis with extensive mucoperiosteal thickening in the left maxillary sinus, apparent frothy fluid in the right maxillary sinus, opacification of several ethmoid air cells and of the inferior portion of the right frontal sinus, and polypoid mucoperiosteal thickening in the right sphenoid sinus. 2. Prior left maxillary antrostomy and suspected partial resection of the right inferior turbinate >> Referred back to ENT> sinus surgery 12/13/11  NB Chronic cough is often simultaneously caused by more than one condition. A single cause has been found from 38 to 82% of the time, multiple causes from 18 to 62%. Multiply caused cough has been the result of three diseases up to 42% of the time.   At 20 mg per day it is extremely unlikely any of his problems are sarcoid related Most likley this is  Classic Upper airway cough syndrome, so named because it's frequently impossible to sort out how much is  CR/sinusitis with freq throat clearing (which can be related to primary GERD)   vs  causing  secondary (" extra esophageal")  GERD from wide swings in gastric pressure that occur with throat clearing, often  promoting self use of mint and menthol lozenges that reduce the lower esophageal sphincter tone and exacerbate the problem further in a cyclical fashion.   These are the same pts (now being labeled as having "irritable larynx syndrome" by some cough centers) who not infrequently have a history of having  failed to tolerate ace inhibitors,  dry powder inhalers or biphosphonates or report having atypical reflux symptoms that don't respond to standard doses of PPI , and are easily confused as having aecopd or asthma flares by even experienced allergists/ pulmonologists.  For now correct the sinus problem while suppressing acid reflux then rec regroup

## 2011-12-13 ENCOUNTER — Other Ambulatory Visit: Payer: Self-pay | Admitting: Otolaryngology

## 2011-12-19 ENCOUNTER — Other Ambulatory Visit: Payer: Self-pay | Admitting: Family Medicine

## 2011-12-19 MED ORDER — OMEPRAZOLE 40 MG PO CPDR: 40.0000 mg | DELAYED_RELEASE_CAPSULE | Freq: Every day | ORAL | Status: DC

## 2011-12-28 ENCOUNTER — Other Ambulatory Visit: Payer: Self-pay | Admitting: *Deleted

## 2011-12-28 MED ORDER — RANITIDINE HCL 300 MG PO TABS: 300.0000 mg | ORAL_TABLET | Freq: Every day | ORAL | Status: DC

## 2012-01-22 ENCOUNTER — Encounter: Payer: Self-pay | Admitting: Internal Medicine

## 2012-01-22 ENCOUNTER — Ambulatory Visit (INDEPENDENT_AMBULATORY_CARE_PROVIDER_SITE_OTHER): Payer: BC Managed Care – PPO | Admitting: Internal Medicine

## 2012-01-22 VITALS — BP 126/82 | HR 69 | Temp 98.3°F | Ht 72.0 in | Wt 171.0 lb

## 2012-01-22 DIAGNOSIS — D869 Sarcoidosis, unspecified: Secondary | ICD-10-CM

## 2012-01-22 DIAGNOSIS — R05 Cough: Secondary | ICD-10-CM

## 2012-01-22 DIAGNOSIS — R059 Cough, unspecified: Secondary | ICD-10-CM

## 2012-01-22 DIAGNOSIS — K219 Gastro-esophageal reflux disease without esophagitis: Secondary | ICD-10-CM

## 2012-01-22 NOTE — Assessment & Plan Note (Signed)
Resolved p sinus surgery typical not of sarcoid but rather Upper airway cough syndrome, so named because it's frequently impossible to sort out how much is  CR/sinusitis with freq throat clearing (which can be related to primary GERD)   vs  causing  secondary (" extra esophageal")  GERD from wide swings in gastric pressure that occur with throat clearing, often  promoting self use of mint and menthol lozenges that reduce the lower esophageal sphincter tone and exacerbate the problem further in a cyclical fashion.   These are the same pts (now being labeled as having "irritable larynx syndrome" by some cough centers) who not infrequently have a history of having failed to tolerate ace inhibitors,  dry powder inhalers or biphosphonates or report having atypical reflux symptoms that don't respond to standard doses of PPI , and are easily confused as having aecopd or asthma flares by even experienced allergists/ pulmonologists.

## 2012-01-22 NOTE — Assessment & Plan Note (Signed)
Not clear is he has primary gerd or gerd secondary to chronic cough but at this point should be able to stop the hs h1 > trial basis emphasized.

## 2012-01-22 NOTE — Patient Instructions (Addendum)
Try to reduce the prednisone to 5 mg one daily (your new floor)  if tolerated but ok to increase to max of 20 mg per day (ceiling) for flare of symptoms   Ok to try leave off the ranitidine at  Bedtime if no cough flare - if cough start back on ranitidine at use your tramadol as needed  Please schedule a follow up office visit in 6 weeks, call sooner if needed  pfts and cxr

## 2012-01-22 NOTE — Progress Notes (Signed)
Subjective:    Patient ID: Philip Hernandez, male    DOB: [birth year]    MRN: [id number]  HPI  Brief patient profile:  89 yowm never smoker followed by [name] for Sarcoid and referred 10/11/11  for second opinion re cough to Ahmiyah Coil  Ov 12/18/2010:[name]:  Followup Stage 2 Pulmonary sarcoid (Also neck nodes, splenic granuloma), cough (due to sarcoid, sinus, and gerd) and class 2-3 dyspnea (due to sarcoid and deconditioning). At last office visit we deemed that sinus and gerd were more important contributors to cough than sarcoid. With active sinus and gerd control, cough is nearly gone. HE is compliant with instructions but he and wife are frustrated with rigidity of diet and want to splurge. Dyspnea persists unchanged and he wants this improved. Taking prednisone 20mg  per day. No side effcts. PFTs 12/04/2010 show mild obstruction on spiro (fev1 2.8L/76%, ratioo 69, normal DLCO but restriction on lung volume - TLC 79%). REC: GERD contril with diet and zegerid. Sinus control with netti pot and nasl steroid. Sarcoid - cut prednisone to 10mg  per day. Dyspnea - refer rehab.   OV June 2011: [name]:  Cc cough has improved > Residual cough is mild and is clearly related to GERD. States that zegerid alone is not cutting it. Finds that he has to be diligent with diet. IF diet is violated, he coughs.  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 Only some residual sinus drainage present - netti pot helps. In addition, he admits today to some elements of habit cough (clears throat perioidically and feels tickle down his throat). IN terms of dyspnea, this is somewhat better. Started rehab 2.5 weeks ago and feels he is not being pushed hard enough. In terms of sarcoid, continues at prednisone 10mg  per day without problems.  REC: 0. COUGH - mostly due to GERD, SINUS and LPR and less due to SARCOID 1. GERD CONTROL - zegerid + start ranitidine + diet sheet + sleep with hob elevated  2.  SINUS - netti pot and saline wash 3. CYCLICAL COUGH - 3 days of silence and distraction techniques 4. SARCOID  - continue prednisone at 10mg  per day.   OV 06/04/11: [name] cc  Cough is worse. RSO Socre 28. Reportes he followed all of above instructions from last visit. REports mild hoarsness, mod-sever clearing of throat, post nasal drop, choking episodes, feeling of lump in throat and moderate feelings of dysphagia, heartburn and cough worse after lying down. He had had GI and ENT eval per hx in past without showing anything anatomic in throat but he is  Convinced he has something in throat. He is tolerting prednisone 5mg  okay. Spiro fev1 2.7L/68%, fvc 3.7L/73% is unchanged and cxr for sarcoid is unchanged too (though radioloigiust thought is a bit worse).     Cough is due to acid  Reflux, sinus, (combined called LPR cough, score of 28 suggests this) and possibly sarcoid though I think currently sarcoid is stable and not the main reason for cough though your cough got worse after we cut prednisone down #GERD  Acid reflux  - continue zegerid 40mg -1100 daily  - PLEASE continue over the counter ranitidine 300mg   at bed time  - follow diet sheet strictly  - do not eat for 3h before going to bed and sleep with head end of bed elevated #SINUS  - continue netti pot - if this is not helping, you can stop it  - continue nasal steroid  -  Refer to [facility] ENT for sinus eval #Cyclical cough  - start gabapentin 300mg  daily x 5 days, then 300mg  twice daily x 5 days, then 300mg  three times daily to continue - when you have urge to cough or clear throat drink water or suck on lozenges   - #SARCOID - stable on breathing tests  - continue prednisone at 5mg  per day - - have spirometry and cxr in  6-8 weeks and we can reassess if sarcoid contributing to symptoms   #SHORTNESS OF BREATH  - continue rehab      OV 07/18/11: [name] cc  RSI cough score is 17 and improved from 28 at last visit. He is  satisfied with cough improvement but still has cough. Says he saw ENT 07/04/11: per hx CT showed left maxillary sinusitis and was given abx for 10 days. Option of surgery discussed but for now only clinical monitoring. Says nose is still clogged up and worse now after [name] 3 days ago. Feels head is swollen. Taking gabapentin as well: thinks this is helping though makes him a bit sleey.  PFT today is near normal rec Cough is due to acid  Reflux, sinus, and possibly sarcoid though currently sarcoid is very stable and not the main reason for cough #GERD  Acid reflux  - continue zegerid 40mg -1100 daily  - PLEASE continue over the counter ranitidine 300mg  po at bed time  - follow diet sheet strictly  - do not eat for 3h before going to bed and sleep with head end of bed elevated #SINUS  - continue netti pot - if this is not helping, you can stop it  - continue nasal steroid  -  Follow advise of Dr [name]  -you have a bit of sinus infection currently: take doxycycline 100mg  po twice daily after meals x 7 days; avoid sunligh #Cyclical cough  - continue gabapentin 300mg  three times daily - can cut down to 2 times daily if making you sleepy - when you have urge to cough or clear throat drink water or suck on lozenges  - #SARCOID - stable on breathing tests  - continue prednisone at 5mg  per day - -#SHORTNESS OF BREATH  - continue rehab    10/11/2011 Pulmonary/ Santosha Jividen/ first eval:   f/u ov cc 3 days after last course of abx cough much better but not completely gone> still urge to clear throat but not present first thing in am but am nasal congestion slt yellow taking zantac 300 mg prn and still on prednisone 5 mg daily for sarcoid. No change mild doe rec Continue omeprazole before bfast every day plus Zantac 150  mg 2  bedtime automatically whether you think you need it or not GERD diet reviewed  If cough has improved to your satisfaction stop neurontin to see if it gets worse,  If so you  need to return here in a month.     11/12/2011 f/u ov/Gatlyn Lipari cc no real change off neurontin in terms of cough then much worse cough x 1 weeks assoc with flare of "sinus" symptoms "due to a cold" with nasal congestion and yellow mucus esp in am.  No change chronic doe on pred 5 mg per day. rec Augmentin 875 one twice daily x 10 days and then CT Sinus needs to be done Please see patient coordinator before you leave today  to schedule Sinus CT> pos sinusitis> refer ent Prednisone increase to 20 mg per day until better - then reduce 10mg  per  day   12/10/2011 f/u ov/Young Mulvey stayed on prednisone at 20 mg per day with no improvement in any symptoms but missed the dose  Am of ov c/o persistent cough worse at hs mostly mucoid sputum sev tsp a day and severe malaise, some nausea, no real limiting sob. No arthralgia or ocular complaints or rash. Since prev ov eval by ent > rec sinus surgery 3/28 rec Prednisone 20 mg per through surgery, then 10 mg per day unless otherwise directed by ent   01/22/2012 f/u ov/Sereen Schaff on 10 mg prednisone no cough nor need for cough meds, not real active but not limted by sob.  Generalized HA daily x years, not enough to take anything more than tylenol, no typical pattern but "sinus pains" all better since surgery. No purulent or bloody drainage, just using NS irrigation.  Sleeping ok without nocturnal  or early am exacerbation  of respiratory  c/o's or need for noct saba. Also denies any obvious fluctuation of symptoms with weather or environmental changes or other aggravating or alleviating factors except as outlined above   ROS  At present neg for  any significant sore throat, dysphagia, dental problems, itching, sneezing,  nasal congestion or excess/ purulent secretions, ear ache,   fever, chills, sweats, unintended wt loss, pleuritic or exertional cp, hemoptysis, palpitations, orthopnea pnd or leg swelling.  Also denies presyncope, palpitations, heartburn, abdominal pain, anorexia,  nausea, vomiting, diarrhea  or change in bowel or urinary habits, change in stools or urine, dysuria,hematuria,  rash, arthralgias, visual complaints, headache, numbness weakness or ataxia or problems with walking or coordination. No noted change in mood/affect or memory.                       Objective:   amb wm unusual but much brighter affect, still very evasive with answers  Wt 180 10/11/2011  > 179 11/12/2011 > 172 12/10/2011 > 01/22/2012  171  HEENT: nl dentition, turbinates, and orophanx. Nl external ear canals without cough reflex   NECK :  without JVD/Nodes/TM/ nl carotid upstrokes bilaterally   LUNGS: no acc muscle use, clear to A and P bilaterally without cough on insp or exp maneuvers   CV:  RRR  no s3 or murmur or increase in P2, no edema   ABD:  soft and nontender with nl excursion in the supine position. No bruits or organomegaly, bowel sounds nl  MS:  warm without deformities, calf tenderness, cyanosis or clubbing  SKIN: warm and dry without lesions / rash   CXR  12/10/2011 : Mild progression of reticulonodular density in the lungs since 10/11/2011 could reflect progression of pulmonary sarcoid or superimposed acute pulmonary infection.              Assessment & Plan:

## 2012-01-22 NOTE — Assessment & Plan Note (Addendum)
Much better symptom control since sinus surgery with no convincing evidence of sarcoid acitivty  The goal with a chronic steroid dependent illness is always arriving at the lowest effective dose that controls the disease/symptoms and not accepting a set "formula" which is based on statistics or guidelines that don't always take into account patient  variability or the natural hx of the dz in every individual patient, which may well vary over time.  For now therefore I recommend the patient maintain  A floor of 5 mg and a ceiling of 20 mg per day prednisone if flares.  Next needs restaging in anticipation perhaps of stopping the prednisone altogether.

## 2012-03-05 ENCOUNTER — Other Ambulatory Visit: Payer: Self-pay | Admitting: Internal Medicine

## 2012-03-05 DIAGNOSIS — D869 Sarcoidosis, unspecified: Secondary | ICD-10-CM

## 2012-03-06 ENCOUNTER — Ambulatory Visit (INDEPENDENT_AMBULATORY_CARE_PROVIDER_SITE_OTHER): Payer: BC Managed Care – PPO | Admitting: Internal Medicine

## 2012-03-06 ENCOUNTER — Ambulatory Visit (INDEPENDENT_AMBULATORY_CARE_PROVIDER_SITE_OTHER)
Admission: RE | Admit: 2012-03-06 | Discharge: 2012-03-06 | Disposition: A | Discharge status: Home / Self Care | Payer: BC Managed Care – PPO | Source: Ambulatory Visit | Attending: Internal Medicine | Admitting: Internal Medicine

## 2012-03-06 ENCOUNTER — Encounter: Payer: Self-pay | Admitting: Internal Medicine

## 2012-03-06 VITALS — BP 100/60 | HR 65 | Temp 97.8°F | Ht 72.0 in | Wt 179.0 lb

## 2012-03-06 DIAGNOSIS — R05 Cough: Secondary | ICD-10-CM

## 2012-03-06 DIAGNOSIS — D869 Sarcoidosis, unspecified: Secondary | ICD-10-CM

## 2012-03-06 DIAGNOSIS — R059 Cough, unspecified: Secondary | ICD-10-CM

## 2012-03-06 LAB — PULMONARY FUNCTION TEST

## 2012-03-06 NOTE — Assessment & Plan Note (Signed)
Feb 2011: Presented as chronic cough and dyspnea  Summer/Fall 2011: Dr [name] cervical node bx - "showed bacteria" Jul 10, 2010: ESR 14, ACE 42, CMV, Ur Histo Age, Blasto, Toxi, EBV - all neg. Quantiferon Gold - Indet. Cocci Antibody - negative Jul 13, 2010: sputum x - Myco mucogenium, 2nd  - Pseudallachria Boydii, 3rd - neg. Deemed contaminant by ID Jul 18, 2010: Pan Ct: Cervical nodes, pulmonary infiltrates, calcified and non calcified mediastinal nodes, splenic granuloma, ? Hepatic granuloma Aug 09, 2010: ENT repeat biopsys: non-necrotizing granuloma (PCR negative for AFB and fungal stains negative) Sep 02, 2010: Started prednisone 60mg  per day for Dx of SARCOIDOSIS with improvement in cough Early Jan 2012: Reduced prednisone to 20mg  per day PFts 12/04/2010: FVC 4.1L/80%, fev1 2.8L/76%, TLC 79%, DLCO 23.5/91% December 18, 2010: REduce prednisone to 10mg  per day 04/23/2011: No change in PFTs. Suspect cough is due to LPR and not sarcoid -> cut prednisone down to 5mg   06/04/11: Radioligy thinks cxr worse, I think is same - continue pred at 5mg  per day 07/18/11: PFT normal. Continue prednisone 01/22/2012 Reduce prednsione to 5 mg per day > reduce to 2.5 mg per day 03/06/2012    I had an extended discussion with the patient today lasting 15 to 20 minutes of a 25 minute visit on the following issues:   The goal with a chronic steroid dependent illness is always arriving at the lowest effective dose that controls the disease/symptoms and not accepting a set "formula" which is based on statistics or guidelines that don't always take into account patient  variability or the natural hx of the dz in every individual patient, which may well vary over time.  For now therefore I recommend the patient maintain  A floor of 2.5 mg per day x 3 months then try qod with goal of off

## 2012-03-06 NOTE — Patient Instructions (Addendum)
Prednisone try 5 mg one half daily to see breathing or coughing worsen, if so resume 5 mg daily.  The inflammation of sarcoid is like a stuck thermostat that typically gets unstuck within 3 years.  Please schedule a follow up visit in 3 months but call sooner if needed [facility] ok to use first Tuesday of every month)

## 2012-03-06 NOTE — Progress Notes (Signed)
Subjective:    Patient ID: Philip Hernandez, male    DOB: [birth year]    MRN: [id number]  HPI  Brief patient profile:  65 yowm never smoker followed by [name] for Sarcoid and referred 10/11/11  for second opinion re cough to [facility]   10/11/2011 Pulmonary/ Kaitlyne Friedhoff/ first eval:   f/u ov cc 3 days after last course of abx cough much better but not completely gone> still urge to clear throat but not present first thing in am but am nasal congestion slt yellow taking zantac 300 mg prn and still on prednisone 5 mg daily for sarcoid. No change mild doe rec Continue omeprazole before bfast every day plus Zantac 150  mg 2  bedtime automatically whether you think you need it or not GERD diet reviewed  If cough has improved to your satisfaction stop neurontin to see if it gets worse,  If so you need to return here in a month.     11/12/2011 f/u ov/Abigaelle Verley cc no real change off neurontin in terms of cough then much worse cough x 1 weeks assoc with flare of "sinus" symptoms "due to a cold" with nasal congestion and yellow mucus esp in am.  No change chronic doe on pred 5 mg per day. rec Augmentin 875 one twice daily x 10 days and then CT Sinus needs to be done Please see patient coordinator before you leave today  to schedule Sinus CT> pos sinusitis> refer ent Prednisone increase to 20 mg per day until better - then reduce 10mg  per day   12/10/2011 f/u ov/Symphony Demuro stayed on prednisone at 20 mg per day with no improvement in any symptoms but missed the dose  Am of ov c/o persistent cough worse at hs mostly mucoid sputum sev tsp a day and severe malaise, some nausea, no real limiting sob. No arthralgia or ocular complaints or rash. Since prev ov eval by ent >   sinus surgery 3/28 rec Prednisone 20 mg per through surgery, then 10 mg per day unless otherwise directed by ent   01/22/2012 f/u ov/Zarion Oliff on 10 mg prednisone no cough nor need for cough meds, not real active but not limted by sob.  Generalized HA daily x years,  not enough to take anything more than tylenol, no typical pattern but "sinus pains" all better since surgery. No purulent or bloody drainage, just using NS irrigation. rec Try to reduce the prednisone to 5 mg one daily (your new floor)  if tolerated but ok to increase to max of 20 mg per day (ceiling) for flare of symptoms   Ok to try leave off the ranitidine at  Bedtime if no cough flare - if cough start back on ranitidine at use your tramadol as needed   pfts and cxr on return   03/06/2012 f/u ov/Esmirna Ravan reduced prednisone to 5 mg daily new floor, stopped ranitidine and breathing ok no flare of cough as yet, also no artricular or ocular c/os or rash. Stopped flonase on his own, can't remember sinus doctor's name or instructions although thinks that helped him more than anything else  Sleeping ok without nocturnal  or early am exacerbation  of respiratory  c/o's or need for noct saba. Also denies any obvious fluctuation of symptoms with weather or environmental changes or other aggravating or alleviating factors except as outlined above   ROS  At present neg for  any significant sore throat, dysphagia, dental problems, itching, sneezing,  nasal congestion or excess/ purulent secretions, ear ache,  fever, chills, sweats, unintended wt loss, pleuritic or exertional cp, hemoptysis, palpitations, orthopnea pnd or leg swelling.  Also denies presyncope, palpitations, heartburn, abdominal pain, anorexia, nausea, vomiting, diarrhea  or change in bowel or urinary habits, change in stools or urine, dysuria,hematuria,  rash, arthralgias, visual complaints, headache, numbness weakness or ataxia or problems with walking or coordination. No noted change in mood/affect or memory.                       Objective:   amb wm unusual but much brighter affect, still very evasive with answers  Wt 180 10/11/2011  > 179 11/12/2011 > 172 12/10/2011 > 01/22/2012  171> 03/06/2012  179  HEENT: nl dentition, turbinates,  and orophanx. Nl external ear canals without cough reflex   NECK :  without JVD/Nodes/TM/ nl carotid upstrokes bilaterally   LUNGS: no acc muscle use, clear to A and P bilaterally without cough on insp or exp maneuvers   CV:  RRR  no s3 or murmur or increase in P2, no edema   ABD:  soft and nontender with nl excursion in the supine position. No bruits or organomegaly, bowel sounds nl  MS:  warm without deformities, calf tenderness, cyanosis or clubbing  SKIN: warm and dry without lesions / rash    CXR  03/06/2012 :  Interstitial and perihilar opacities in a similar distribution to prior in this patient with known history of sarcoidosis.               Assessment & Plan:

## 2012-03-06 NOTE — Progress Notes (Signed)
PFT done today. 

## 2012-03-06 NOTE — Assessment & Plan Note (Signed)
Still concerned about upper airway cough syndrome and risk of recurrent rhinitis/sinusitis as pt can't remember his ent doctor's name much less his instructions esp re the use of ICS >  rec low threshold to repeat eval if flares.

## 2012-03-11 ENCOUNTER — Encounter: Payer: Self-pay | Admitting: Internal Medicine

## 2012-04-17 ENCOUNTER — Other Ambulatory Visit: Payer: Self-pay | Admitting: *Deleted

## 2012-04-17 MED ORDER — OMEPRAZOLE 40 MG PO CPDR: 40.0000 mg | DELAYED_RELEASE_CAPSULE | Freq: Every day | ORAL | Status: DC

## 2012-06-17 ENCOUNTER — Ambulatory Visit (INDEPENDENT_AMBULATORY_CARE_PROVIDER_SITE_OTHER): Payer: BC Managed Care – PPO | Admitting: Internal Medicine

## 2012-06-17 ENCOUNTER — Encounter: Payer: Self-pay | Admitting: Internal Medicine

## 2012-06-17 VITALS — BP 112/76 | HR 76 | Temp 98.1°F | Ht 72.0 in | Wt 183.0 lb

## 2012-06-17 DIAGNOSIS — D869 Sarcoidosis, unspecified: Secondary | ICD-10-CM

## 2012-06-17 MED ORDER — OMEPRAZOLE 40 MG PO CPDR: 40.0000 mg | DELAYED_RELEASE_CAPSULE | Freq: Every day | ORAL | Status: DC

## 2012-06-17 NOTE — Progress Notes (Signed)
Subjective:    Patient ID: Philip Hernandez, male    DOB: 24-Dec-1955    MRN: [id number]  HPI  Brief patient profile:  35 yowm never smoker followed by [name] for Sarcoid and referred 10/11/11  for second opinion re cough to [facility]   10/11/2011 Pulmonary/ WERT/ first eval:   f/u ov cc 3 days after last course of abx cough much better but not completely gone> still urge to clear throat but not present first thing in am but am nasal congestion slt yellow taking zantac 300 mg prn and still on prednisone 5 mg daily for sarcoid. No change mild doe rec Continue omeprazole before bfast every day plus Zantac 150  mg 2  bedtime automatically whether you think you need it or not GERD diet reviewed  If cough has improved to your satisfaction stop neurontin to see if it gets worse,  If so you need to return here in a month.     11/12/2011 f/u ov/Wert cc no real change off neurontin in terms of cough then much worse cough x 1 weeks assoc with flare of "sinus" symptoms "due to a cold" with nasal congestion and yellow mucus esp in am.  No change chronic doe on pred 5 mg per day. rec Augmentin 875 one twice daily x 10 days and then CT Sinus needs to be done Please see patient coordinator before you leave today  to schedule Sinus CT> pos sinusitis> refer ent Prednisone increase to 20 mg per day until better - then reduce 10mg  per day   12/10/2011 f/u ov/Wert stayed on prednisone at 20 mg per day with no improvement in any symptoms but missed the dose  Am of ov c/o persistent cough worse at hs mostly mucoid sputum sev tsp a day and severe malaise, some nausea, no real limiting sob. No arthralgia or ocular complaints or rash. Since prev ov eval by ent >   sinus surgery 3/28 rec Prednisone 20 mg per through surgery, then 10 mg per day unless otherwise directed by ent   01/22/2012 f/u ov/Wert on 10 mg prednisone no cough nor need for cough meds, not real active but not limted by sob.  Generalized HA daily x years,  not enough to take anything more than tylenol, no typical pattern but "sinus pains" all better since surgery. No purulent or bloody drainage, just using NS irrigation. rec Try to reduce the prednisone to 5 mg one daily (your new floor)  if tolerated but ok to increase to max of 20 mg per day (ceiling) for flare of symptoms  Ok to try leave off the ranitidine at  Bedtime if no cough flare - if cough start back on ranitidine at use your tramadol as needed      03/06/2012 f/u ov/Wert reduced prednisone to 5 mg daily new floor, stopped ranitidine and breathing ok no flare of cough as yet, also no artricular or ocular c/os or rash. Stopped flonase on his own, can't remember sinus doctor's name or instructions although thinks that helped him more than anything else. rec Prednisone try 5 mg one half daily to see breathing or coughing worsen, if so resume 5 mg daily.    06/17/2012 f/u ov/Wert cc prednisone 5 mg one half each am and doing great,  No unusual cough, purulent sputum or sinus/hb symptoms on present rx - not needing any cough suppression at all.  No ocular or articular concerns or sob.    Sleeping ok without nocturnal  or  early am exacerbation  of respiratory  c/o's or need for noct saba. Also denies any obvious fluctuation of symptoms with weather or environmental changes or other aggravating or alleviating factors except as outlined above   ROS  The following are not active complaints unless bolded sore throat, dysphagia, dental problems, itching, sneezing,  nasal congestion or excess/ purulent secretions, ear ache,   fever, chills, sweats, unintended wt loss, pleuritic or exertional cp, hemoptysis,  orthopnea pnd or leg swelling, presyncope, palpitations, heartburn, abdominal pain, anorexia, nausea, vomiting, diarrhea  or change in bowel or urinary habits, change in stools or urine, dysuria,hematuria,  rash, arthralgias, visual complaints, headache, numbness weakness or ataxia or problems with  walking or coordination,  change in mood/affect or memory.                           Objective:   amb wm unusual but much brighter affect   Wt 180 10/11/2011  > 179 11/12/2011 > 172 12/10/2011 > 01/22/2012  171> 03/06/2012  179 > 06/17/2012 183  HEENT: nl dentition, turbinates, and orophanx. Nl external ear canals without cough reflex   NECK :  without JVD/Nodes/TM/ nl carotid upstrokes bilaterally   LUNGS: no acc muscle use, clear to A and P bilaterally without cough on insp or exp maneuvers   CV:  RRR  no s3 or murmur or increase in P2, no edema   ABD:  soft and nontender with nl excursion in the supine position. No bruits or organomegaly, bowel sounds nl  MS:  warm without deformities, calf tenderness, cyanosis or clubbing  SKIN: warm and dry without lesions / rash    CXR  03/06/2012 :  Interstitial and perihilar opacities in a similar distribution to prior in this patient with known history of sarcoidosis.               Assessment & Plan:

## 2012-06-17 NOTE — Assessment & Plan Note (Addendum)
2005: Travel to [facility] Feb 2011: Presented as chronic cough and dyspnea  Summer/Fall 2011: Dr [name] cervical node bx - "showed bacteria" Jul 10, 2010: ESR 14, ACE 42, CMV, Ur Histo Age, Blasto, Toxi, EBV - all neg. Quantiferon Gold - Indet. Cocci Antibody - negative Jul 13, 2010: sputum x - Myco mucogenium, 2nd  - Pseudallachria Boydii, 3rd - neg. Deemed contaminant by ID Jul 18, 2010: Pan Ct: Cervical nodes, pulmonary infiltrates, calcified and non calcified mediastinal nodes, splenic granuloma, ? Hepatic granuloma Aug 09, 2010: ENT repeat biopsys: non-necrotizing granuloma (PCR negative for AFB and fungal stains negative) Sep 02, 2010: Started prednisone 60mg  per day for Dx of SARCOIDOSIS with improvement in cough Early Jan 2012: Reduced prednisone to 20mg  per day PFts 12/04/2010: FVC 4.1L/80%, fev1 2.8L/76%, TLC 79%, DLCO 23.5/91% December 18, 2010: REduce prednisone to 10mg  per day 04/23/2011: No change in PFTs. Suspect cough is due to LPR and not sarcoid -> cut prednisone down to 5mg   07/18/11: PFT normal. Continue prednisone 01/22/2012 Reduce prednsione to 5 mg per day > reduce to 2.5 mg per day 03/06/2012  03/06/12 PFT's wnl > try qod effective 06/17/2012   The goal with a chronic steroid dependent illness is always arriving at the lowest effective dose that controls the disease/symptoms and not accepting a set "formula" which is based on statistics or guidelines that don't always take into account patient  variability or the natural hx of the dz in every individual patient, which may well vary over time.  For now therefore I recommend the patient maintain  2.5 mg qod as the new floor

## 2012-06-17 NOTE — Patient Instructions (Addendum)
Try prednisone 5 mg one half odd days only to see any symptoms worsen and if so resume daily dosing. No other changes  Please schedule a follow up visit in 2 months but call sooner if needed.

## 2012-08-19 ENCOUNTER — Ambulatory Visit: Payer: BC Managed Care – PPO | Admitting: Internal Medicine

## 2012-08-29 ENCOUNTER — Encounter: Payer: Self-pay | Admitting: Internal Medicine

## 2012-08-29 ENCOUNTER — Ambulatory Visit (INDEPENDENT_AMBULATORY_CARE_PROVIDER_SITE_OTHER): Payer: BC Managed Care – PPO | Admitting: Internal Medicine

## 2012-08-29 VITALS — BP 120/74 | HR 71 | Temp 97.7°F | Ht 72.0 in | Wt 181.0 lb

## 2012-08-29 DIAGNOSIS — D869 Sarcoidosis, unspecified: Secondary | ICD-10-CM

## 2012-08-29 NOTE — Patient Instructions (Addendum)
When you are over your cold ok to try every 3rd day prednisone and stop it completely 2 weeks before next visit   Please schedule a follow up visit in 3 months but call sooner if needed first Tuesday in March at [place] office

## 2012-08-29 NOTE — Progress Notes (Signed)
Subjective:    Patient ID: Philip Hernandez, male    DOB: 04/23/1956    MRN: [id number]  HPI  Brief patient profile:  48 yowm never smoker followed by [name] for Sarcoid and referred 10/11/11  for second opinion re cough to [facility]   10/11/2011 Pulmonary/ Emrys Mceachron/ first eval:   f/u ov cc 3 days after last course of abx cough much better but not completely gone> still urge to clear throat but not present first thing in am but am nasal congestion slt yellow taking zantac 300 mg prn and still on prednisone 5 mg daily for sarcoid. No change mild doe rec Continue omeprazole before bfast every day plus Zantac 150  mg 2  bedtime automatically whether you think you need it or not GERD diet reviewed  If cough has improved to your satisfaction stop neurontin to see if it gets worse,  If so you need to return here in a month.     11/12/2011 f/u ov/Fahad Cisse cc no real change off neurontin in terms of cough then much worse cough x 1 weeks assoc with flare of "sinus" symptoms "due to a cold" with nasal congestion and yellow mucus esp in am.  No change chronic doe on pred 5 mg per day. rec Augmentin 875 one twice daily x 10 days and then CT Sinus needs to be done Please see patient coordinator before you leave today  to schedule Sinus CT> pos sinusitis> refer ent Prednisone increase to 20 mg per day until better - then reduce 10mg  per day   12/10/2011 f/u ov/Tache Bobst stayed on prednisone at 20 mg per day with no improvement in any symptoms but missed the dose  Am of ov c/o persistent cough worse at hs mostly mucoid sputum sev tsp a day and severe malaise, some nausea, no real limiting sob. No arthralgia or ocular complaints or rash. Since prev ov eval by ent >   sinus surgery 3/28 rec Prednisone 20 mg per through surgery, then 10 mg per day unless otherwise directed by ent   01/22/2012 f/u ov/Vashti Bolanos on 10 mg prednisone no cough nor need for cough meds, not real active but not limted by sob.  Generalized HA daily x years,  not enough to take anything more than tylenol, no typical pattern but "sinus pains" all better since surgery. No purulent or bloody drainage, just using NS irrigation. rec Try to reduce the prednisone to 5 mg one daily (your new floor)  if tolerated but ok to increase to max of 20 mg per day (ceiling) for flare of symptoms  Ok to try leave off the ranitidine at  Bedtime if no cough flare - if cough start back on ranitidine at use your tramadol as needed      03/06/2012 f/u ov/Vondell Sowell reduced prednisone to 5 mg daily new floor, stopped ranitidine and breathing ok no flare of cough as yet, also no artricular or ocular c/os or rash. Stopped flonase on his own, can't remember sinus doctor's name or instructions although thinks that helped him more than anything else. rec Prednisone try 5 mg one half daily to see breathing or coughing worsen, if so resume 5 mg daily.    06/17/2012 f/u ov/Ketih Goodie cc prednisone 5 mg one half each am and doing great rec Try qod prednisone 5mg  one half   08/29/2012 f/u ov/Oakleigh Hesketh cc coughing c "cold" x 2 weeks, rx with shot and abx and improving back to baseline.  No obvious daytime variabilty or assoc sob  or cp or chest tightness, subjective wheeze overt sinus or hb symptoms. No unusual exp hx       No ocular or articular concerns or rash.    Sleeping ok without nocturnal  or early am exacerbation  of respiratory  c/o's or need for noct saba. Also denies any obvious fluctuation of symptoms with weather or environmental changes or other aggravating or alleviating factors except as outlined above   ROS  The following are not active complaints unless bolded sore throat, dysphagia, dental problems, itching, sneezing,  nasal congestion or excess/ purulent secretions, ear ache,   fever, chills, sweats, unintended wt loss, pleuritic or exertional cp, hemoptysis,  orthopnea pnd or leg swelling, presyncope, palpitations, heartburn, abdominal pain, anorexia, nausea, vomiting, diarrhea   or change in bowel or urinary habits, change in stools or urine, dysuria,hematuria,  rash, arthralgias, visual complaints, headache, numbness weakness or ataxia or problems with walking or coordination,  change in mood/affect or memory.                           Objective:   amb wm unusual but much brighter affect   Wt 180 10/11/2011 > 179 11/12/2011 >01/22/2012  171> 03/06/2012  179 > 06/17/2012 183 > 181 08/29/2012   HEENT: nl dentition, turbinates, and orophanx. Nl external ear canals without cough reflex   NECK :  without JVD/Nodes/TM/ nl carotid upstrokes bilaterally   LUNGS: no acc muscle use, clear to A and P bilaterally without cough on insp or exp maneuvers   CV:  RRR  no s3 or murmur or increase in P2, no edema   ABD:  soft and nontender with nl excursion in the supine position. No bruits or organomegaly, bowel sounds nl  MS:  warm without deformities, calf tenderness, cyanosis or clubbing  SKIN: warm and dry without lesions / rash    CXR  03/06/2012 :  Interstitial and perihilar opacities in a similar distribution to prior in this patient with known history of sarcoidosis.               Assessment & Plan:

## 2012-08-30 NOTE — Assessment & Plan Note (Signed)
Feb 2011: Presented as chronic cough and dyspnea  Summer/Fall 2011: Dr [name] cervical node bx - "showed bacteria" Jul 10, 2010: ESR 14, ACE 42, CMV, Ur Histo Age, Blasto, Toxi, EBV - all neg. Quantiferon Gold - Indet. Cocci Antibody - negative Jul 13, 2010: sputum x - Myco mucogenium, 2nd  - Pseudallachria Boydii, 3rd - neg. Deemed contaminant by ID Jul 18, 2010: Pan Ct: Cervical nodes, pulmonary infiltrates, calcified and non calcified mediastinal nodes, splenic granuloma, ? Hepatic granuloma Aug 09, 2010: ENT repeat biopsys: non-necrotizing granuloma (PCR negative for AFB and fungal stains negative) Sep 02, 2010: Started prednisone 60mg  per day for Dx of SARCOIDOSIS with improvement in cough Early Jan 2012: Reduced prednisone to 20mg  per day PFts 12/04/2010: FVC 4.1L/80%, fev1 2.8L/76%, TLC 79%, DLCO 23.5/91% December 18, 2010: REduce prednisone to 10mg  per day 04/23/2011: No change in PFTs. Suspect cough is due to LPR and not sarcoid -> cut prednisone down to 5mg   07/18/11: PFT normal. Continue prednisone 01/22/2012 Reduce prednsione to 5 mg per day > reduce to 2.5 mg per day 03/06/2012  03/06/12 PFT's wnl > try qod effective 06/17/2012    The goal with a chronic steroid dependent illness is always arriving at the lowest effective dose that controls the disease/symptoms and not accepting a set "formula" which is based on statistics or guidelines that don't always take into account patient  variability or the natural hx of the dz in every individual patient, which may well vary over time.  For now therefore I recommend the patient maintain  A floor of 2.5 mg qod but p his apparent uri wean off by next ov

## 2012-11-15 ENCOUNTER — Encounter: Payer: Self-pay | Admitting: Family Medicine

## 2012-12-08 ENCOUNTER — Encounter: Payer: Self-pay | Admitting: Family Medicine

## 2012-12-08 ENCOUNTER — Ambulatory Visit (INDEPENDENT_AMBULATORY_CARE_PROVIDER_SITE_OTHER): Payer: BC Managed Care – PPO

## 2012-12-08 ENCOUNTER — Other Ambulatory Visit: Payer: Self-pay | Admitting: Family Medicine

## 2012-12-08 ENCOUNTER — Ambulatory Visit: Payer: BC Managed Care – PPO

## 2012-12-08 ENCOUNTER — Ambulatory Visit (INDEPENDENT_AMBULATORY_CARE_PROVIDER_SITE_OTHER): Payer: BC Managed Care – PPO | Admitting: Family Medicine

## 2012-12-08 VITALS — BP 123/78 | HR 90 | Temp 98.3°F | Ht 72.0 in | Wt 172.4 lb

## 2012-12-08 DIAGNOSIS — C61 Malignant neoplasm of prostate: Secondary | ICD-10-CM

## 2012-12-08 DIAGNOSIS — M25559 Pain in unspecified hip: Secondary | ICD-10-CM

## 2012-12-08 DIAGNOSIS — G63 Polyneuropathy in diseases classified elsewhere: Secondary | ICD-10-CM

## 2012-12-08 DIAGNOSIS — M25551 Pain in right hip: Secondary | ICD-10-CM

## 2012-12-08 DIAGNOSIS — Z87442 Personal history of urinary calculi: Secondary | ICD-10-CM

## 2012-12-08 DIAGNOSIS — E785 Hyperlipidemia, unspecified: Secondary | ICD-10-CM

## 2012-12-08 DIAGNOSIS — K219 Gastro-esophageal reflux disease without esophagitis: Secondary | ICD-10-CM

## 2012-12-08 DIAGNOSIS — D869 Sarcoidosis, unspecified: Secondary | ICD-10-CM

## 2012-12-08 DIAGNOSIS — M25552 Pain in left hip: Secondary | ICD-10-CM

## 2012-12-08 DIAGNOSIS — Z0000 Encounter for general adult medical examination without abnormal findings: Secondary | ICD-10-CM

## 2012-12-08 DIAGNOSIS — D8689 Sarcoidosis of other sites: Secondary | ICD-10-CM

## 2012-12-08 DIAGNOSIS — J310 Chronic rhinitis: Secondary | ICD-10-CM

## 2012-12-08 LAB — THYROID PANEL WITH TSH
Free Thyroxine Index: 2.1 (ref 1.0–3.9)
T3 Uptake: 29.6 % (ref 22.5–37.0)
T4, Total: 7.2 ug/dL (ref 5.0–12.5)
TSH: 2.428 u[IU]/mL (ref 0.350–4.500)

## 2012-12-08 LAB — COMPLETE METABOLIC PANEL WITH GFR
ALT: 14 U/L (ref 0–53)
AST: 16 U/L (ref 0–37)
Albumin: 3.9 g/dL (ref 3.5–5.2)
Alkaline Phosphatase: 65 U/L (ref 39–117)
BUN: 17 mg/dL (ref 6–23)
CO2: 25 mEq/L (ref 19–32)
Calcium: 9.7 mg/dL (ref 8.4–10.5)
Chloride: 104 mEq/L (ref 96–112)
Creat: 1.18 mg/dL (ref 0.50–1.35)
GFR, Est African American: 80 mL/min
GFR, Est Non African American: 69 mL/min
Glucose, Bld: 84 mg/dL (ref 70–99)
Potassium: 4.9 mEq/L (ref 3.5–5.3)
Sodium: 139 mEq/L (ref 135–145)
Total Bilirubin: 1.3 mg/dL — ABNORMAL HIGH (ref 0.3–1.2)
Total Protein: 7 g/dL (ref 6.0–8.3)

## 2012-12-08 LAB — URIC ACID: Uric Acid, Serum: 6.7 mg/dL — ABNORMAL HIGH (ref 4.0–6.0)

## 2012-12-08 MED ORDER — CEFDINIR 300 MG PO CAPS: 300.0000 mg | ORAL_CAPSULE | Freq: Two times a day (BID) | ORAL | Status: DC

## 2012-12-08 MED ORDER — FLUTICASONE PROPIONATE 50 MCG/ACT NA SUSP: 2.0000 | Freq: Every day | NASAL | Status: DC

## 2012-12-08 NOTE — Progress Notes (Signed)
Subjective:     Patient ID: Philip Hernandez, male   DOB: 1956/02/10, 57 y.o.   MRN: [id number]  HPI Annual physical exam - Plan: EKG 12-Lead  Sarcoidosis  ADENOCARCINOMA, PROSTATE  GERD  Peripheral neuropathy in sarcoidosis  Patient here for complete annual physical examination. However he is also here for review of medical problems listed. He also has nasal congestion today and his head feels very congested. No fever or chills. No pain in the sinuses like he did last time he was treated by me for sinusitis. In the interim he was also seen by his ear nose and throat specialist and by the time he got better 3 days after completing antibiotics he did not have any sinus infection. However today he has yellow phlegm coming out of his nose.  Another problem is having today is that his hips hurt. Left hip worse than the right hip. He has difficulty getting out of his chair. Past Medical History  Diagnosis Date  . Sarcoidosis dec 2011    stage 2 pulmonary, neck nodes, splenic granuloma  . GERD (gastroesophageal reflux disease) Jan 2012    Dr Arlyce Dice. Bx proven.  Deve abd pain in Jan 2012 following high dose steroids for sarcoid. s/p EGD and improvement in symptoms following PPI  . Malignant neoplasm of prostate dec 2010    Dr Heloise Purpura. s/p surgery dec 2010, hx of rnormal PSA  since then  . Cough     chronic sinus issues, GERD with poor dietary control and Pulmonary Sarcoid.   Marland Kitchen Swelling, mass, or lump in head and neck   . Other and unspecified mycoses   . Painful respiration   . Hypogonadism male   . Colon polyp   . Kidney stone   . Peripheral neuropathy    Past Surgical History  Procedure Laterality Date  . Orif clavicle fracture      right  . Shoulder surgery      right  . Hernia repair  13 yrs ago  . Prostate surgery    . Nasal sinus surgery     History   Social History  . Marital Status: Married    Spouse Name: N/A    Number of Children: N/A  . Years of Education:  N/A   Occupational History  . unemployed    Social History Main Topics  . Smoking status: Never Smoker   . Smokeless tobacco: Never Used  . Alcohol Use: No  . Drug Use: No  . Sexually Active: Not on file   Other Topics Concern  . Not on file   Social History Narrative   Married   2 children   Family History  Problem Relation Age of Onset  . Colon cancer    . Cancer Mother     colon  . Cancer Brother     colon   Current Outpatient Prescriptions on File Prior to Visit  Medication Sig Dispense Refill  . acetaminophen (TYLENOL) 500 MG tablet Take 500 mg by mouth every 6 (six) hours as needed.      . cetirizine (ZYRTEC) 10 MG tablet Take 10 mg by mouth daily.      . cholecalciferol (VITAMIN D) 1000 UNITS tablet Take 1,000 Units by mouth daily.        Marland Kitchen gabapentin (NEURONTIN) 600 MG tablet Take 900 mg by mouth 3 (three) times daily.      Marland Kitchen omeprazole (PRILOSEC) 40 MG capsule Take 1 capsule (40 mg total) by mouth  daily.  30 capsule  5  . amoxicillin (AMOXIL) 500 MG capsule 1 tablet twice daily      . b complex vitamins tablet Take 1 tablet by mouth daily.      . hydrochlorothiazide (MICROZIDE) 12.5 MG capsule Take 12.5 mg by mouth daily.      . predniSONE (DELTASONE) 5 MG tablet 1/2 tablet every other day      . [DISCONTINUED] Sodium Chloride-Sodium Bicarb 1.57 G PACK Use as directed once every am       No current facility-administered medications on file prior to visit.   No Known Allergies Immunization History  Administered Date(s) Administered  . Influenza Whole 08/29/2010   Prior to Admission medications   Medication Sig Start Date End Date Taking? Authorizing Provider  acetaminophen (TYLENOL) 500 MG tablet Take 500 mg by mouth every 6 (six) hours as needed.   Yes Historical Provider, MD  cetirizine (ZYRTEC) 10 MG tablet Take 10 mg by mouth daily.   Yes Historical Provider, MD  cholecalciferol (VITAMIN D) 1000 UNITS tablet Take 1,000 Units by mouth daily.     Yes  Historical Provider, MD  gabapentin (NEURONTIN) 600 MG tablet Take 900 mg by mouth 3 (three) times daily.   Yes Historical Provider, MD  omeprazole (PRILOSEC) 40 MG capsule Take 1 capsule (40 mg total) by mouth daily. 06/17/12 06/17/13 Yes Nyoka Cowden, MD  amoxicillin (AMOXIL) 500 MG capsule 1 tablet twice daily 08/23/12   Historical Provider, MD  b complex vitamins tablet Take 1 tablet by mouth daily.    Historical Provider, MD  hydrochlorothiazide (MICROZIDE) 12.5 MG capsule Take 12.5 mg by mouth daily.    Historical Provider, MD  predniSONE (DELTASONE) 5 MG tablet 1/2 tablet every other day 11/12/11   Nyoka Cowden, MD    Review of Systems  Constitutional: Positive for fatigue.  HENT: Positive for congestion.        Nasal congestion coryza postnasal drip boggy swollen nasal mucosa left nostril obstructed. Patient has been using over-the-counter Afrin this is the third day  Eyes: Negative.   Respiratory: Positive for cough.   Cardiovascular: Negative.   Gastrointestinal: Negative.   Endocrine: Negative.   Genitourinary: Negative.   Musculoskeletal: Positive for arthralgias and gait problem.       Over last few years he has noticed that his hips become sore and is difficult for him to get out of a chair. He's also been on long-standing prednisone for sarcoidosis. One of the specialists was going to order a hip x-ray and apparently forgot.  Skin: Negative.   Allergic/Immunologic: Negative.   Neurological: Positive for numbness.  Hematological: Negative.   Psychiatric/Behavioral: Negative.        Objective:   Physical Exam Waist 38 1/4 inches.BP 123/78  Pulse 90  Temp(Src) 98.3 F (36.8 C) (Oral)  Ht 6' (1.829 m)  Wt 172 lb 6.4 oz (78.2 kg)  BMI 23.38 kg/m2  Appearance: The patient appeared well nourished and normally developed. In no acute distress. He has central obesity.   Vital signs:  As documented.   Head exam: . No scleral icterus or corneal arcus noted. His  nasal mucosa is boggy swollen left nostril is almost obstructed and he has coryza, no double nostrils yellow thick   Neck: Without jugular venous distension, thyromegaly, or carotid bruits. Carotid upstrokes are brisk bilaterally.  Chest & Lungs: The chest wall is normal. The lungs are clear to auscultation and percussion. The breath sounds are coarse  Cardiac exam: Reveals the PMI to be normally sized and situated. Rhythm is regular. First and second heart sounds normal. No murmurs, rubs or gallops.  Abdominal exam: Reveals normal bowel sounds, No masses, no organomegaly and no aortic enlargement.  No guarding no rebound. No Bruits.  GU: Penis testes normal in inguinal canal examination did not reveal any hernias.  Rectal examination was deferred because he gets regular colonoscopy. He also was seen the urologist for prostate cancer every 3 months now it is going to be annually so he defers having a digital rectal examination today. And he also defers having a PSA because his urologist does this regularly.  Extremities: nonedematous. Both femoral and pedal pulses are normal. Other peripheral pulses are normal. Joints: decreased range of motion of the hips with discomfort especially of the left hip. He has pes planus flat feet.  Neurologic: Oriented to time place and person.  Strength is normal. Sensory is  abnormal in a stocking distribution of the lower extremities up to the knees. There is numbness and chronic burning sensation Reflexes are 1+ only. Cranial Nerves are normal.  Sensory exam: monofilament exam was normal and intact.  Skin exam:  Normal without Tattoos, rashes or scars.     Assessment:     Annual physical exam - Plan: EKG 12-Lead  Sarcoidosis  ADENOCARCINOMA, PROSTATE  GERD  Peripheral neuropathy in sarcoidosis  Rhinitis. Suppurative rhinitis  Pes planus/flat feet  Hip pain    History of kidney stones.  On reviewing his chart  dyslipidemia was found but a low HDL normal LDL and a very high LDL particle Plan:          Results for orders placed in visit on 11/15/12  [facility] COLONOSCOPY      Result Value Range   [facility] Colonoscopy colon Polyp      [facility] reading (PRIMARY) by  Dr. Leodis Sias: X-rays of the hips bilaterally preliminary read in degenerative changes. Await official radiology interpretation.                                  Medications prescribed: Omnicef 300 mg twice a day for 10 days, Flonase 2 sprays each nostril daily. As per meds/orders. This is to treat his purulent rhinitis. Advised him to stay on acetaminophen for his hip arthritis until we get the final report from radiology.  Discussed this with his wife and patient Discussed health issues. However due to his acute illness prefers to delay tdap and pneumococcal vaccine. He will do this at a later date or the next visit. He also declines having the influenza vaccine today because it's late in the season. He would prefer to do it later this year in the fall.  While this issues discussed.  Diet and Exercise discussed with patient. For nutrition information, I recommended books: Eat to Live by Dr Monico Hoar. Prevent and Reverse Heart Disease by Dr Suzzette Righter.  Exercise recommendations are:  If unable to walk, then the patient can exercise in a chair 3 times a day. By flapping arms like a bird gently and raising legs outwards to the front.  If ambulatory, the patient can go for walks for 30 minutes 3 times a week. Then increase the intensity and duration as tolerated. Goal is to try to attain exercise frequency to 5 times a week. Best to perform resistance exercises 2 days a week and cardio type exercises 3 days  per week.  Taraoluwa Thakur P. [place] Charon, M.D.

## 2012-12-09 LAB — VITAMIN D 25 HYDROXY (VIT D DEFICIENCY, FRACTURES): Vit D, 25-Hydroxy: 46 ng/mL (ref 30–89)

## 2012-12-09 NOTE — Progress Notes (Signed)
Pt aware of xr report and verbalized understanding

## 2012-12-11 ENCOUNTER — Ambulatory Visit: Payer: BC Managed Care – PPO | Admitting: Internal Medicine

## 2012-12-11 LAB — NMR LIPOPROFILE WITH LIPIDS
Cholesterol, Total: 158 mg/dL (ref <200)
HDL Particle Number: 26.1 umol/L — ABNORMAL LOW (ref >=30.5)
HDL Size: 8.7 nm — ABNORMAL LOW (ref >=9.2)
HDL-C: 37 mg/dL — ABNORMAL LOW (ref >=40)
LDL (calc): 103 mg/dL — ABNORMAL HIGH (ref <100)
LDL Particle Number: 1487 nmol/L — ABNORMAL HIGH (ref <1000)
LDL Size: 20.3 nm — ABNORMAL LOW (ref >20.5)
LP-IR Score: 50 — ABNORMAL HIGH (ref <=45)
Large HDL-P: 1.4 umol/L — ABNORMAL LOW (ref >=4.8)
Large VLDL-P: 1.4 nmol/L (ref <=2.7)
Small LDL Particle Number: 885 nmol/L — ABNORMAL HIGH (ref <=527)
Triglycerides: 90 mg/dL (ref <150)
VLDL Size: 42.3 nm (ref >=46.6)

## 2012-12-16 ENCOUNTER — Ambulatory Visit (INDEPENDENT_AMBULATORY_CARE_PROVIDER_SITE_OTHER): Payer: BC Managed Care – PPO | Admitting: Internal Medicine

## 2012-12-16 ENCOUNTER — Encounter: Payer: Self-pay | Admitting: Internal Medicine

## 2012-12-16 VITALS — BP 110/70 | HR 70 | Temp 98.0°F | Ht 72.0 in | Wt 173.0 lb

## 2012-12-16 DIAGNOSIS — D869 Sarcoidosis, unspecified: Secondary | ICD-10-CM

## 2012-12-16 MED ORDER — OMEPRAZOLE 40 MG PO CPDR: 40.0000 mg | DELAYED_RELEASE_CAPSULE | Freq: Every day | ORAL | Status: DC

## 2012-12-16 NOTE — Progress Notes (Signed)
Subjective:    Patient ID: Philip Hernandez, male    DOB: [birth year]    MRN: [id number]  HPI  Brief patient profile:  18 yowm never smoker followed by Philip Hernandez for Sarcoid and referred 10/11/11  for second opinion re cough to [facility]   10/11/2011 Pulmonary/ [name]/ first eval:   f/u ov cc 3 days after last course of abx cough much better but not completely gone> still urge to clear throat but not present first thing in am but am nasal congestion slt yellow taking zantac 300 mg prn and still on prednisone 5 mg daily for sarcoid. No change mild doe rec Continue omeprazole before bfast every day plus Zantac 150  mg 2  bedtime automatically whether you think you need it or not GERD diet reviewed  If cough has improved to your satisfaction stop neurontin to see if it gets worse,  If so you need to return here in a month.     11/12/2011 f/u ov/Philip Hernandez cc no real change off neurontin in terms of cough then much worse cough x 1 weeks assoc with flare of "sinus" symptoms "due to a cold" with nasal congestion and yellow mucus esp in am.  No change chronic doe on pred 5 mg per day. rec Augmentin 875 one twice daily x 10 days and then CT Sinus needs to be done Please see patient coordinator before you leave today  to schedule Sinus CT> pos sinusitis> refer ent Prednisone increase to 20 mg per day until better - then reduce 10mg  per day   12/10/2011 f/u ov/Philip Hernandez stayed on prednisone at 20 mg per day with no improvement in any symptoms but missed the dose  Am of ov c/o persistent cough worse at hs mostly mucoid sputum sev tsp a day and severe malaise, some nausea, no real limiting sob. No arthralgia or ocular complaints or rash. Since prev ov eval by ent >   sinus surgery 3/28 rec Prednisone 20 mg per through surgery, then 10 mg per day unless otherwise directed by ent   01/22/2012 f/u ov/Philip Hernandez on 10 mg prednisone no cough nor need for cough meds, not real active but not limted by sob.  Generalized HA daily x years,  not enough to take anything more than tylenol, no typical pattern but "sinus pains" all better since surgery. No purulent or bloody drainage, just using NS irrigation. rec Try to reduce the prednisone to 5 mg one daily (your new floor)  if tolerated but ok to increase to max of 20 mg per day (ceiling) for flare of symptoms  Ok to try leave off the ranitidine at  Bedtime if no cough flare - if cough start back on ranitidine at use your tramadol as needed      03/06/2012 f/u ov/Philip Hernandez reduced prednisone to 5 mg daily new floor, stopped ranitidine and breathing ok no flare of cough as yet, also no artricular or ocular c/os or rash. Stopped flonase on his own, can't remember sinus doctor's name or instructions although thinks that helped him more than anything else. rec Prednisone try 5 mg one half daily to see breathing or coughing worsen, if so resume 5 mg daily.    06/17/2012 f/u ov/Philip Hernandez cc prednisone 5 mg one half each am and doing great rec Try qod prednisone 5mg  one half   08/29/2012 f/u ov/Philip Hernandez cc coughing c "cold" x 2 weeks, rx with shot and abx and improving back to baseline.  No obvious daytime variabilty or assoc sob  or cp or chest tightness, subjective wheeze overt sinus or hb symptoms. No unusual exp hx   rec When you are over your cold ok to try every 3rd day prednisone and stop it completely 2 weeks before next visit  Please schedule a follow up visit in 3 months but call sooner if needed first Tuesday in March at [facility] office    12/16/2012 f/u ov/Philip Hernandez cc  Chief Complaint  Patient presents with  . Sarcoidosis    follow-up. Pt stopped prednisone on 11-25-12.   Cough has not recurred off prednisone - not sure breathing any change from baseline, could not name a single activity where sob is limiting him in any way  No ocular or articular concerns or rash.    Sleeping ok without nocturnal  or early am exacerbation  of respiratory  c/o's or need for noct saba. Also denies any  obvious fluctuation of symptoms with weather or environmental changes or other aggravating or alleviating factors except as outlined above   ROS  The following are not active complaints unless bolded sore throat, dysphagia, dental problems, itching, sneezing,  nasal congestion or excess/ purulent secretions, ear ache,   fever, chills, sweats, unintended wt loss, pleuritic or exertional cp, hemoptysis,  orthopnea pnd or leg swelling, presyncope, palpitations, heartburn, abdominal pain, anorexia, nausea, vomiting, diarrhea  or change in bowel or urinary habits, change in stools or urine, dysuria,hematuria,  rash, arthralgias, visual complaints, headache, numbness both feet weakness or ataxia or problems with walking or coordination,  change in mood/affect or memory.                           Objective:   amb wm unusual but much brighter affect   Wt 180 10/11/2011 > 179 11/12/2011 >01/22/2012  171> 03/06/2012  179 > 06/17/2012 183 > 181 08/29/2012 > 173 12/16/2012   HEENT: nl dentition, turbinates, and orophanx. Nl external ear canals without cough reflex   NECK :  without JVD/Nodes/TM/ nl carotid upstrokes bilaterally   LUNGS: no acc muscle use, clear to A and P bilaterally without cough on insp or exp maneuvers   CV:  RRR  no s3 or murmur or increase in P2, no edema   ABD:  soft and nontender with nl excursion in the supine position. No bruits or organomegaly, bowel sounds nl  MS:  warm without deformities, calf tenderness, cyanosis or clubbing  SKIN: warm and dry without lesions / rash    CXR  03/06/2012 :  Interstitial and perihilar opacities in a similar distribution to prior in this patient with known history of sarcoidosis.               Assessment & Plan:

## 2012-12-16 NOTE — Patient Instructions (Addendum)
Leave off prednisone for now  Please schedule a follow up visit in 3 months but call sooner if needed cxr and pft's

## 2012-12-16 NOTE — Assessment & Plan Note (Signed)
2005: Travel to [facility] Feb 2011: Presented as chronic cough and dyspnea  Summer/Fall 2011: Dr [name] cervical node bx - "showed bacteria" Jul 10, 2010: ESR 14, ACE 42, CMV, Ur Histo Age, Blasto, Toxi, EBV - all neg. Quantiferon Gold - Indet. Cocci Antibody - negative Jul 13, 2010: sputum x - Myco mucogenium, 2nd  - Pseudallachria Boydii, 3rd - neg. Deemed contaminant by ID Jul 18, 2010: Pan Ct: Cervical nodes, pulmonary infiltrates, calcified and non calcified mediastinal nodes, splenic granuloma, ? Hepatic granuloma Aug 09, 2010: ENT repeat biopsys: non-necrotizing granuloma (PCR negative for AFB and fungal stains negative) Sep 02, 2010: Started prednisone 60mg  per day for Dx of SARCOIDOSIS with improvement in cough Early Jan 2012: Reduced prednisone to 20mg  per day PFts 12/04/2010: FVC 4.1L/80%, fev1 2.8L/76%, TLC 79%, DLCO 23.5/91% December 18, 2010: REduce prednisone to 10mg  per day 04/23/2011: No change in PFTs. Suspect cough is due to LPR and not sarcoid -> cut prednisone down to 5mg   07/18/11: PFT normal. Continue prednisone 01/22/2012 Reduce prednsione to 5 mg per day > reduce to 2.5 mg per day 03/06/2012  03/06/12 PFT's wnl > try qod effective 06/17/2012 > d/c 12/09/12 s flare  So far tolerating off prednisone  I had an extended discussion with the patient today lasting 15 to 20 minutes of a 25 minute visit on the following issues:  Looking for any clinical symptoms to suggest need to restart prednisone - if none, then restage in 3 months  See instructions for specific recommendations which were reviewed directly with the patient who was given a copy with highlighter outlining the key components.

## 2012-12-27 ENCOUNTER — Ambulatory Visit (INDEPENDENT_AMBULATORY_CARE_PROVIDER_SITE_OTHER): Payer: BC Managed Care – PPO | Admitting: Family Medicine

## 2012-12-27 ENCOUNTER — Encounter: Payer: Self-pay | Admitting: Family Medicine

## 2012-12-27 VITALS — BP 116/71 | HR 77 | Temp 98.9°F | Ht 72.0 in | Wt 169.4 lb

## 2012-12-27 DIAGNOSIS — J329 Chronic sinusitis, unspecified: Secondary | ICD-10-CM

## 2012-12-27 DIAGNOSIS — J310 Chronic rhinitis: Secondary | ICD-10-CM

## 2012-12-27 DIAGNOSIS — R059 Cough, unspecified: Secondary | ICD-10-CM

## 2012-12-27 DIAGNOSIS — D869 Sarcoidosis, unspecified: Secondary | ICD-10-CM

## 2012-12-27 DIAGNOSIS — R05 Cough: Secondary | ICD-10-CM

## 2012-12-27 DIAGNOSIS — R062 Wheezing: Secondary | ICD-10-CM

## 2012-12-27 MED ORDER — CEFDINIR 300 MG PO CAPS: 300.0000 mg | ORAL_CAPSULE | Freq: Two times a day (BID) | ORAL | Status: DC

## 2012-12-27 MED ORDER — HYDROCODONE-HOMATROPINE 5-1.5 MG/5ML PO SYRP: 5.0000 mL | ORAL_SOLUTION | Freq: Three times a day (TID) | ORAL | Status: DC | PRN

## 2012-12-27 NOTE — Progress Notes (Signed)
Patient ID: Philip Hernandez, male   DOB: [birth year], 57 y.o.   MRN: [id number] SUBJECTIVE:   HPI:  Cough and  Wheezing and congestion and scratchy throat and eyes running. Known to have sarcoidosis. No fever no sore throat   PMH/PSH: reviewed/updated in Epic  SH/FH: reviewed/updated in Epic  Allergies: reviewed/updated in Epic  Medications: reviewed/updated in Epic  Immunizations: reviewed/updated in Epic    ROS: As above in the HPI. All other systems are stable or negative.  OBJECTIVE:    APPEARANCE: congested cough Patient in no acute distress.The patient appeared well nourished and normally developed. Acyanotic.  Waist:  VITAL SIGNS:BP 116/71  Pulse 77  Temp(Src) 98.9 F (37.2 C) (Oral)  Ht 6' (1.829 m)  Wt 169 lb 6.4 oz (76.839 kg)  BMI 22.97 kg/m2   SKIN: warm and  Dry without overt rashes, tattoos and scars  HEAD and Neck: without JVD,rhinorrhea and lacrimating. No scleral icterus  CHEST & LUNGS: Clear in apices. Rhonchi and wheezes in bases. Worse on the right.  CVS: Reveals the PMI to be normally located. Regular rhythm, First and Second Heart sounds are normal, and absence of murmurs, rubs or gallops.  ABDOMEN:  Benign,, no organomegaly, no masses, no Abdominal Aortic enlargement. No Guarding , no rebound. No Bruits.  RECTAL:n/a/  GU:n/a  EXTREMETIES: nonedematous. Both Femoral and Pedal pulses are normal.  MUSCULOSKELETAL:  Spine: normal Joints: intact  NEUROLOGIC: oriented to time,place and person; nonfocal. Strength is normal Sensory is normal Reflexes are normal Cranial Nerves are normal.   ASSESSMENT:  Cough  Sarcoidosis  Wheezing  Sinusitis nasal - Plan: cefdinir (OMNICEF) 300 MG capsule  Purulent rhinitis - Plan: cefdinir (OMNICEF) 300 MG capsule    PLAN:  No orders of the defined types were placed in this encounter.   No results found for this or any previous visit (from the past 24 hour(s)). Meds ordered this  encounter  Medications  . cefdinir (OMNICEF) 300 MG capsule    Sig: Take 1 capsule (300 mg total) by mouth 2 (two) times daily.    Dispense:  20 capsule    Refill:  0  . HYDROcodone-homatropine (HYCODAN) 5-1.5 MG/5ML syrup    Sig: Take 5 mLs by mouth every 8 (eight) hours as needed for cough.    Dispense:  120 mL    Refill:  0   2 samples of advair 115.21 HFA 2 puffs twice  A day. Demonstrate the use of the inhaler. Patient to use the Flonase. Patient to use the Zyrtec. He has at home.  RTC 2 weeks.  Keirstin Musil P. Modesto Charon, M.D.

## 2013-01-07 ENCOUNTER — Encounter: Payer: Self-pay | Admitting: *Deleted

## 2013-01-12 ENCOUNTER — Ambulatory Visit: Payer: BC Managed Care – PPO | Admitting: Family Medicine

## 2013-01-13 ENCOUNTER — Encounter: Payer: Self-pay | Admitting: Family Medicine

## 2013-01-13 ENCOUNTER — Ambulatory Visit (INDEPENDENT_AMBULATORY_CARE_PROVIDER_SITE_OTHER): Payer: BC Managed Care – PPO | Admitting: Family Medicine

## 2013-01-13 VITALS — BP 104/68 | HR 72 | Temp 97.6°F | Wt 167.0 lb

## 2013-01-13 DIAGNOSIS — R062 Wheezing: Secondary | ICD-10-CM | POA: Insufficient documentation

## 2013-01-13 DIAGNOSIS — R05 Cough: Secondary | ICD-10-CM

## 2013-01-13 DIAGNOSIS — D869 Sarcoidosis, unspecified: Secondary | ICD-10-CM

## 2013-01-13 DIAGNOSIS — R0602 Shortness of breath: Secondary | ICD-10-CM

## 2013-01-13 DIAGNOSIS — R059 Cough, unspecified: Secondary | ICD-10-CM

## 2013-01-13 NOTE — Progress Notes (Signed)
Patient ID: Philip Hernandez, male   DOB: 03-14-1956, 57 y.o.   MRN: [id number] SUBJECTIVE: HPI: Came for follow up of coughing wheezing and Sarcoidosis. Pulmonary had discontinued his prednisone. Has felt this may have aggravated a relapse. Much better with the Advair inhaler.  has had shortness of breath has had wheezing has had a response to medications Medications used:advair inhaler HFA has not had swelling of the feet has not had chest pain has not had exposure tobacco smoke or other triggers. has had a flu shot has not had a pneumonia shot has not had fever has not had purulent phlegm has not had blood in the sputum, has not had weight loss has had any recent medication changes.    PMH/PSH: reviewed/updated in Epic  SH/FH: reviewed/updated in Epic  Allergies: reviewed/updated in Epic  Medications: reviewed/updated in Epic  Immunizations: reviewed/updated in Epic  ROS: As above in the HPI. All other systems are stable or negative.  OBJECTIVE: APPEARANCE: looks better [facility] Patient in no acute distress.The patient appeared well nourished and normally developed. Acyanotic. Waist: VITAL SIGNS:BP 104/68  Pulse 72  Temp(Src) 97.6 F (36.4 C) (Oral)  Wt 167 lb (75.751 kg)  BMI 22.64 kg/m2   SKIN: warm and  Dry without overt rashes, tattoos and scars  HEAD and Neck: without JVD, Head and scalp: normal Eyes:No scleral icterus. Fundi normal, eye movements normal. Ears: Auricle normal, canal normal, Tympanic membranes normal, insufflation normal. Nose: normal Throat: normal Neck & thyroid: normal  CHEST & LUNGS: Chest wall: normal Lungs:Rhonchi with few scattered wheezes. No rales.  CVS: Reveals the PMI to be normally located. Regular rhythm, First and Second Heart sounds are normal,  absence of murmurs, rubs or gallops. Peripheral vasculature: Radial pulses: normal Dorsal pedis pulses: normal Posterior pulses: normal  ABDOMEN:  Appearance:  normal Benign,, no organomegaly, no masses, no Abdominal Aortic enlargement. No Guarding , no rebound. No Bruits. Bowel sounds: normal  EXTREMETIES: nonedematous. Both Femoral and Pedal pulses are normal.   NEUROLOGIC: oriented to time,place and person; nonfocal.  ASSESSMENT: Cough  Wheezing  DYSPNEA  Sarcoidosis  Improved but needs further re-evaluation with Pulmonary.  PLAN:  We did not have anymore advair, but had a lot of symbicort. Will switch to symbicort  Symbicort 160/4.5 2 pufss po bid. # 4 samples.  Keep appt with pulmonary  RTc in 2 months.  Hillman Attig P. [place] Charon, M.D.

## 2013-02-25 ENCOUNTER — Ambulatory Visit (INDEPENDENT_AMBULATORY_CARE_PROVIDER_SITE_OTHER): Payer: BC Managed Care – PPO | Admitting: Neurology

## 2013-02-25 ENCOUNTER — Encounter: Payer: Self-pay | Admitting: Neurology

## 2013-02-25 VITALS — BP 109/64 | HR 68 | Temp 97.6°F | Ht 72.0 in | Wt 170.0 lb

## 2013-02-25 DIAGNOSIS — R200 Anesthesia of skin: Secondary | ICD-10-CM | POA: Insufficient documentation

## 2013-02-25 DIAGNOSIS — M25552 Pain in left hip: Secondary | ICD-10-CM | POA: Insufficient documentation

## 2013-02-25 DIAGNOSIS — G589 Mononeuropathy, unspecified: Secondary | ICD-10-CM

## 2013-02-25 DIAGNOSIS — G629 Polyneuropathy, unspecified: Secondary | ICD-10-CM | POA: Insufficient documentation

## 2013-02-25 DIAGNOSIS — M25559 Pain in unspecified hip: Secondary | ICD-10-CM

## 2013-02-25 DIAGNOSIS — R209 Unspecified disturbances of skin sensation: Secondary | ICD-10-CM

## 2013-02-25 MED ORDER — AMITRIPTYLINE HCL 25 MG PO TABS: 25.0000 mg | ORAL_TABLET | Freq: Every day | ORAL | Status: DC

## 2013-02-25 MED ORDER — GABAPENTIN 800 MG PO TABS: 800.0000 mg | ORAL_TABLET | Freq: Four times a day (QID) | ORAL | Status: DC

## 2013-02-25 NOTE — Patient Instructions (Addendum)
Follow up in 3 months.  Start Elavil 25mg  1/2 tablet at bedtime x 1 week then increase to whole tablet at bedtime.  Will take at least 2 weeks for decrease in pain.    Neuropathy Neuropathy means your peripheral nerves are not working normally. Peripheral nerves are the nerves outside the brain and spinal cord. Messages between the brain and the rest of the body do not work properly with peripheral nerve disorders. CAUSES There are many different causes of peripheral nerve disorders. These include:  Injury.   Infections.   Diabetes.   Vitamin deficiency.   Poor circulation.   Alcoholism.   Exposure to toxins.   Drug effects.   Tumors.   Kidney disease.  SYMPTOMS  Tingling, burning, pain, and numbness in the extremities.   Weakness and loss of muscle tone and size.  DIAGNOSIS Blood tests and special studies of nerve function may help confirm the diagnosis.  TREATMENT  Treatment includes adopting healthy life habits.   A good diet, vitamin supplements, and mild pain medicine may be needed.   Avoid known toxins such as alcohol, tobacco, and recreational drugs.   Anti-convulsant medicines are helpful in some types of neuropathy.  Make a follow-up appointment with your caregiver to be sure you are getting better with treatment.  SEEK IMMEDIATE MEDICAL CARE IF:   You have breathing problems.   You have severe or uncontrolled pain.   You notice extreme weakness or you feel faint.   You are not better after 1 week or if you have worse symptoms.  Document Released: 10/11/2004 Document Revised: 05/16/2011 Document Reviewed: 09/03/2005 [facility] Patient Information 2012 [place], [place].

## 2013-02-25 NOTE — Progress Notes (Signed)
[facility]  PATIENT: Philip Hernandez DOB: [birth year]  HISTORY FROM: patient, chart REASON FOR VISIT: routine follow up   HISTORICAL  CHIEF COMPLAINT:  Chief Complaint  Patient presents with  . Follow-up    RV8    HISTORY OF PRESENT ILLNESS:  57 year old Caucasian male with history of bilateral hands and feet paresthesias and weakness since March 2013 likely from peripheral neuropathy returns for followup. Numbness continues, no relief. No treatable cause identified. Neuropathy may be likely to his underlying sarcoidosis. He has been decreased with his prednisone.  Update 11/19/2012: He returns for followup after last visit on 07/14/2012. He states his paresthesias and tingling are unchanged and about the same. He states that gabapentin does help him but the effect lasted only about 5-6 hours. He is tolerating it well without any significant side effects. He states his circumflex doses is stable and he is considering seeing his pulmonologist to discuss tapering and stopping his prednisone. He is complaining of left hip localized pain which he feels started after the EMG study. His pain is intermittent and at times sharp. Tylenol relieves the pain. He also complains of decreased stamina and gets higher easily and feels he will be unable to go back to work because of his pain and lack of stamina. He plans to apply for disability.  Update 02/25/2013:  He returns for followup since his last visit on 11/19/2012. He states his paresthesias and tingling are unchanged and about the same. He also complains of shooting pain in his toes. His gabapentin was increased at last visit and he states it does help him. He complains of increased muscle weakness in bilateral legs. He states that his balance is a problem but he has not fallen. He knows he has to walk slowly and steady and not make any sudden movements. Sometimes when he is down on the ground it difficult for him to get up by  himself. He has applied for disability at the beginning of May. His prednisone was completely tapered in February with good results.  REVIEW OF SYSTEMS: Full 14 system review of systems performed and notable only for fatigue, blurred vision, dry eyes, short of breath, hearing loss, aching muscles, moles, allergies, memory loss, numbness, weakness, not enough sleep.  ALLERGIES: No Known Allergies  HOME MEDICATIONS: Outpatient Prescriptions Prior to Visit  Medication Sig Dispense Refill  . acetaminophen (TYLENOL) 500 MG tablet Take 500 mg by mouth every 6 (six) hours as needed.      Marland Kitchen b complex vitamins tablet Take 1 tablet by mouth daily.      . cetirizine (ZYRTEC) 10 MG tablet Take 10 mg by mouth daily.      . cholecalciferol (VITAMIN D) 1000 UNITS tablet Take 1,000 Units by mouth daily.        . fluticasone (FLONASE) 50 MCG/ACT nasal spray Place 2 sprays into the nose daily.  16 g  1  . fluticasone-salmeterol (ADVAIR HFA) 115-21 MCG/ACT inhaler Inhale 2 puffs into the lungs 2 (two) times daily.      Marland Kitchen omeprazole (PRILOSEC) 40 MG capsule Take 1 capsule (40 mg total) by mouth daily.  30 capsule  5  . cefdinir (OMNICEF) 300 MG capsule Take 1 capsule (300 mg total) by mouth 2 (two) times daily.  20 capsule  0  . gabapentin (NEURONTIN) 600 MG tablet Take 900 mg by mouth 3 (three) times daily.      Marland Kitchen HYDROcodone-homatropine (HYCODAN) 5-1.5 MG/5ML syrup Take 5  mLs by mouth every 8 (eight) hours as needed for cough.  120 mL  0   No facility-administered medications prior to visit.    PAST MEDICAL HISTORY: Past Medical History  Diagnosis Date  . Sarcoidosis dec 2011    stage 2 pulmonary, neck nodes, splenic granuloma  . GERD (gastroesophageal reflux disease) Jan 2012    Dr Arlyce Dice. Bx proven.  Deve abd pain in Jan 2012 following high dose steroids for sarcoid. s/p EGD and improvement in symptoms following PPI  . Malignant neoplasm of prostate dec 2010    Dr Heloise Purpura. s/p surgery dec 2010,  hx of rnormal PSA  since then  . Cough     chronic sinus issues, GERD with poor dietary control and Pulmonary Sarcoid.   Marland Kitchen Swelling, mass, or lump in head and neck   . Other and unspecified mycoses   . Painful respiration   . Hypogonadism male   . Colon polyp   . Kidney stone   . Peripheral neuropathy     PAST SURGICAL HISTORY: Past Surgical History  Procedure Laterality Date  . Orif clavicle fracture      right  . Shoulder surgery      right  . Hernia repair  13 yrs ago  . Prostate surgery    . Nasal sinus surgery      FAMILY HISTORY: Family History  Problem Relation Age of Onset  . Colon cancer    . Cancer Mother     colon  . Cancer Brother     colon    SOCIAL HISTORY: History   Social History  . Marital Status: Married    Spouse Name: N/A    Number of Children: N/A  . Years of Education: N/A   Occupational History  . unemployed    Social History Main Topics  . Smoking status: Never Smoker   . Smokeless tobacco: Never Used  . Alcohol Use: No  . Drug Use: No  . Sexually Active: Not on file   Other Topics Concern  . Not on file   Social History Narrative   Married   2 children     PHYSICAL EXAM  Filed Vitals:   02/25/13 1440  BP: 109/64  Pulse: 68  Temp: 97.6 F (36.4 C)  TempSrc: Oral  Height: 6' (1.829 m)  Weight: 170 lb (77.111 kg)   Body mass index is 23.05 kg/(m^2).  GENERAL EXAM: Pleasant middle aged Caucasian male, in no distress. Afebrile. Head: Nontraumatic. Ears, Nose and throat: Hearing is normal. Neck: Supple without bruit. Respiratory: CTA Musculoskeletal: no deformity. Tenderness left hip lateral aspect.  No visible erythema. Skin: no rash. Old surgical scar on neck from lymph node removal.  CARDIOVASCULAR: Regular rate and rhythm, no murmurs, no carotid bruits  NEUROLOGIC: MENTAL STATUS: awake, alert, language fluent, comprehension intact, naming intact CRANIAL NERVE: no papilledema on fundoscopic exam, pupils  equal and reactive to light, visual fields full to confrontation, extraocular muscles intact, no nystagmus, facial sensation and strength symmetric, uvula midline, shoulder shrug symmetric, tongue midline. MOTOR: normal bulk and tone, full strength in the BUE, BLE. Reveals no upper or lower extremity drift. Mild weakness in bilateral ankle dorsiflexors, right more than left SENSORY: Touch and pinprick sensations are diminished at toes bilaterally and to ankle on right and over fingertips bilaterally. Impaired vibration sense of her toes and fingertips bilaterally. Position sense is preserved. COORDINATION: finger-nose-finger, fine finger movements normal REFLEXES: deep tendon reflexes present and symmetric GAIT/STATION: Slow cautious  steady gait. Able to do tandem walk with mild difficulty. Able to stand on toes and heels with moderate difficulty. romberg is negative   DIAGNOSTIC DATA (LABS, IMAGING, TESTING) - I reviewed patient records, labs, notes, testing and imaging myself where available.  Lab Results  Component Value Date   WBC 10.5 12/10/2011   HGB 12.0* 12/10/2011   HCT 36.8* 12/10/2011   MCV 84.0 12/10/2011   PLT 153.0 12/10/2011      Component Value Date/Time   NA 139 12/08/2012 1128   K 4.9 12/08/2012 1128   CL 104 12/08/2012 1128   CO2 25 12/08/2012 1128   GLUCOSE 84 12/08/2012 1128   BUN 17 12/08/2012 1128   CREATININE 1.18 12/08/2012 1128   CREATININE 1.1 12/10/2011 1024   CALCIUM 9.7 12/08/2012 1128   PROT 7.0 12/08/2012 1128   ALBUMIN 3.9 12/08/2012 1128   AST 16 12/08/2012 1128   ALT 14 12/08/2012 1128   ALKPHOS 65 12/08/2012 1128   BILITOT 1.3* 12/08/2012 1128   GFRNONAA >60 08/07/2010 0921   GFRAA  Value: >60        The eGFR has been calculated using the MDRD equation. This calculation has not been validated in all clinical situations. eGFR's persistently <60 mL/min signify possible Chronic Kidney Disease. 08/07/2010 0921   Lab Results  Component Value Date   LDLCALC 103*  12/08/2012   TRIG 90 12/08/2012   No results found for this basename: HGBA1C   No results found for this basename: VITAMINB12   Lab Results  Component Value Date   TSH 2.428 12/08/2012    ASSESSMENT AND PLAN  57 year old Caucasian right-handed male with history of bilateral hands and feet paresthesias and weakness since March 2013 likely from peripheral neuropathy returns for followup. Numbness continues, with mild relief with gabapentin. No treatable cause is identified. Neuropathy may likely be related to his underlying sarcoidosis. His prednisone was stopped in February.  Plan:  1. Continue gabapentin 800 mg 4 times a day.  2. Start Elavil 25 mg at bedtime, taking one half tablet each night for the first week, then increasing to one tablet at bedtime each night. 3. patient is unable to return back to work in his present clinical situation due to pain and lack of stamina. 4. return for followup in 6 months with Larita Fife, NP.  Meds ordered this encounter  Medications  . amitriptyline (ELAVIL) 25 MG tablet    Sig: Take 1 tablet (25 mg total) by mouth at bedtime.    Dispense:  30 tablet    Refill:  3    Take 1/2 tablet at bedtime for the first week then increase to 1 tablet.    Order Specific Question:  Supervising Provider    Answer:  Micki Riley [[id number]]  . gabapentin (NEURONTIN) 800 MG tablet    Sig: Take 1 tablet (800 mg total) by mouth 4 (four) times daily.    Dispense:  120 tablet    Refill:  6    Order Specific Question:  Supervising Provider    Answer:  Micki Riley [[id number]]    LYNN LAM NP-C 02/25/2013, 4:18 PM  [facility] [address], [address] [place], [place] [address] [phone]  I have personally examined this patient, reviewed pertinent data, developed plan of care and discussed with patient and agree with above. Delia Heady, MD

## 2013-03-03 ENCOUNTER — Ambulatory Visit: Payer: BC Managed Care – PPO | Admitting: Family Medicine

## 2013-03-12 ENCOUNTER — Ambulatory Visit: Payer: BC Managed Care – PPO | Admitting: Family Medicine

## 2013-03-23 ENCOUNTER — Ambulatory Visit (INDEPENDENT_AMBULATORY_CARE_PROVIDER_SITE_OTHER): Payer: BC Managed Care – PPO | Admitting: Internal Medicine

## 2013-03-23 ENCOUNTER — Ambulatory Visit (INDEPENDENT_AMBULATORY_CARE_PROVIDER_SITE_OTHER)
Admission: RE | Admit: 2013-03-23 | Discharge: 2013-03-23 | Disposition: A | Discharge status: Home / Self Care | Payer: BC Managed Care – PPO | Source: Ambulatory Visit | Attending: Internal Medicine | Admitting: Internal Medicine

## 2013-03-23 ENCOUNTER — Encounter: Payer: Self-pay | Admitting: Internal Medicine

## 2013-03-23 VITALS — BP 114/72 | HR 69 | Temp 97.9°F | Ht 71.0 in | Wt 171.0 lb

## 2013-03-23 DIAGNOSIS — R05 Cough: Secondary | ICD-10-CM

## 2013-03-23 DIAGNOSIS — D869 Sarcoidosis, unspecified: Secondary | ICD-10-CM

## 2013-03-23 DIAGNOSIS — R0602 Shortness of breath: Secondary | ICD-10-CM

## 2013-03-23 DIAGNOSIS — R059 Cough, unspecified: Secondary | ICD-10-CM

## 2013-03-23 LAB — PULMONARY FUNCTION TEST

## 2013-03-23 NOTE — Progress Notes (Signed)
Subjective:    Patient ID: Philip Hernandez, male    DOB: October 10, 1955    MRN: [id number]    Brief patient profile:  17 yowm never smoker followed by Philip Hernandez for Sarcoid and referred 10/11/11  for second opinion re cough to [facility]   10/11/2011 Pulmonary/ Philip Hernandez/ first eval:   f/u ov cc 3 days after last course of abx cough much better but not completely gone> still urge to clear throat but not present first thing in am but am nasal congestion slt yellow taking zantac 300 mg prn and still on prednisone 5 mg daily for sarcoid. No change mild doe rec Continue omeprazole before bfast every day plus Zantac 150  mg 2  bedtime automatically whether you think you need it or not GERD diet reviewed  If cough has improved to your satisfaction stop neurontin to see if it gets worse,  If so you need to return here in a month.     11/12/2011 f/u ov/Philip Hernandez cc no real change off neurontin in terms of cough then much worse cough x 1 weeks assoc with flare of "sinus" symptoms "due to a cold" with nasal congestion and yellow mucus esp in am.  No change chronic doe on pred 5 mg per day. rec Augmentin 875 one twice daily x 10 days and then CT Sinus needs to be done Please see patient coordinator before you leave today  to schedule Sinus CT> pos sinusitis> refer ent Prednisone increase to 20 mg per day until better - then reduce 10mg  per day   12/10/2011 f/u ov/Philip Hernandez stayed on prednisone at 20 mg per day with no improvement in any symptoms but missed the dose  Am of ov c/o persistent cough worse at hs mostly mucoid sputum sev tsp a day and severe malaise, some nausea, no real limiting sob. No arthralgia or ocular complaints or rash. Since prev ov eval by ent >   sinus surgery 3/28 rec Prednisone 20 mg per through surgery, then 10 mg per day unless otherwise directed by ent   01/22/2012 f/u ov/Philip Hernandez on 10 mg prednisone no cough nor need for cough meds, not real active but not limted by sob.  Generalized HA daily x years, not  enough to take anything more than tylenol, no typical pattern but "sinus pains" all better since surgery. No purulent or bloody drainage, just using NS irrigation. rec Try to reduce the prednisone to 5 mg one daily (your new floor)  if tolerated but ok to increase to max of 20 mg per day (ceiling) for flare of symptoms  Ok to try leave off the ranitidine at  Bedtime if no cough flare - if cough start back on ranitidine at use your tramadol as needed      03/06/2012 f/u ov/Philip Hernandez reduced prednisone to 5 mg daily new floor, stopped ranitidine and breathing ok no flare of cough as yet, also no artricular or ocular c/os or rash. Stopped flonase on his own, can't remember sinus doctor's name or instructions although thinks that helped him more than anything else. rec Prednisone try 5 mg one half daily to see breathing or coughing worsen, if so resume 5 mg daily.    06/17/2012 f/u ov/Philip Hernandez cc prednisone 5 mg one half each am and doing great rec Try qod prednisone 5mg  one half   08/29/2012 f/u ov/Philip Hernandez cc coughing c "cold" x 2 weeks, rx with shot and abx and improving back to baseline.  No obvious daytime variabilty or assoc sob  or cp or chest tightness, subjective wheeze overt sinus or hb symptoms. No unusual exp hx   rec When you are over your cold ok to try every 3rd day prednisone and stop it completely 2 weeks before next visit  Please schedule a follow up visit in 3 months but call sooner if needed first Tuesday in March at [facility] office    12/16/2012 f/u ov/Philip Hernandez cc  Chief Complaint  Patient presents with  . Sarcoidosis    follow-up. Pt stopped prednisone on 11-25-12.   Cough has not recurred off prednisone - not sure breathing any change from baseline, could not name a single activity where sob is limiting him in any way rec No change > leave off prednisone  03/23/2013 f/u ov/Philip Hernandez re sarcoid/ chronic cough/ stopped symbiocort x one month no change Chief Complaint  Patient presents with  .  Followup with PFT    Pt states overall breathing is doing well, has occ DOE when he is working. Stopped symbicort due to bad taste.   not happy with glasses (the eye doctor could never get them right) , stopped elavil at 25 mg didn't work. Variable chest tighness not proportional to activity,  no nausea, or diaphoresis, no change on or off symbicort. No need for saba - prev chest tightness same as now eval by Philip Hernandez neg for ischemia.    No ocular or articular concerns or rash.    Sleeping ok without nocturnal  or early am exacerbation  of respiratory  c/o's or need for noct saba. Also denies any obvious fluctuation of symptoms with weather or environmental changes or other aggravating or alleviating factors except as outlined above   ROS  The following are not active complaints unless bolded sore throat, dysphagia, dental problems, itching, sneezing,  nasal congestion or excess/ purulent secretions, ear ache,   fever, chills, sweats, unintended wt loss, pleuritic or exertional cp, hemoptysis,  orthopnea pnd or leg swelling, presyncope, palpitations, heartburn, abdominal pain, anorexia, nausea, vomiting, diarrhea  or change in bowel or urinary habits, change in stools or urine, dysuria,hematuria,  rash, arthralgias, visual complaints, headache, numbness both feet weakness or ataxia or problems with walking or coordination,  change in mood/affect or memory.           Objective:   amb wm unusual affect who cleared throat  Less frequently  Wt 180 10/11/2011 > 179 11/12/2011 >01/22/2012  171> 03/06/2012  179 > 06/17/2012 183 > 181 08/29/2012 > 173 12/16/2012 >  03/23/2013  171   HEENT: nl dentition, turbinates, and orophanx. Nl external ear canals without cough reflex   NECK :  without JVD/Nodes/TM/ nl carotid upstrokes bilaterally   LUNGS: no acc muscle use, clear to A and P bilaterally without cough on insp or exp maneuvers   CV:  RRR  no s3 or murmur or increase in P2, no edema   ABD:  soft and  nontender with nl excursion in the supine position. No bruits or organomegaly, bowel sounds nl  MS:  warm without deformities, calf tenderness, cyanosis or clubbing  SKIN: warm and dry without lesions / rash       CXR  03/23/2013 :   Interval improvement in upper lobe interstitial markings compatible  with reversible changes of sarcoid.               Assessment & Plan:   Outpatient Encounter Prescriptions as of 03/23/2013  Medication Sig Dispense Refill  . acetaminophen (TYLENOL) 500 MG tablet Take 500  mg by mouth every 6 (six) hours as needed.      Marland Kitchen b complex vitamins tablet Take 1 tablet by mouth daily.      . cetirizine (ZYRTEC) 10 MG tablet Take 10 mg by mouth daily.      . cholecalciferol (VITAMIN D) 1000 UNITS tablet Take 1,000 Units by mouth daily.        . fluticasone (FLONASE) 50 MCG/ACT nasal spray Place 2 sprays into the nose daily.  16 g  1  . gabapentin (NEURONTIN) 800 MG tablet Take 1 tablet (800 mg total) by mouth 4 (four) times daily.  120 tablet  6  . omeprazole (PRILOSEC) 40 MG capsule Take 1 capsule (40 mg total) by mouth daily.  30 capsule  5  . amitriptyline (ELAVIL) 25 MG tablet Take 1 tablet (25 mg total) by mouth at bedtime.  30 tablet  3  . [DISCONTINUED] fluticasone-salmeterol (ADVAIR HFA) 115-21 MCG/ACT inhaler Inhale 2 puffs into the lungs 2 (two) times daily.       No facility-administered encounter medications on file as of 03/23/2013.

## 2013-03-23 NOTE — Progress Notes (Signed)
Quick Note:  Spoke with pt and notified of results per Dr. Wert. Pt verbalized understanding and denied any questions.  ______ 

## 2013-03-23 NOTE — Patient Instructions (Addendum)
To get the most out of exercise, you need to be continuously aware that you are short of breath, but never out of breath, for 30 minutes daily. As you improve, it will actually be easier for you to do the same amount of exercise  in  30 minutes so always push to the level where you are short of breath.   If you have chest discomfort with exertion that becomes different than your previous problem that was already evaluated by a cardiologist (Dr Eden Emms),  then it needs to be re-evaluated   Amitriptylline (elavil) is a perfect choice for you to take at bedtime but will need longterm titratration upward to help your legs, your mood and your throat clearing   Please remember to go to the  x-ray department downstairs for your tests - we will call you with the results when they are available.   If you are satisfied with your treatment plan let your doctor know and he/she can either refill your medications or you can return here when your prescription runs out.     If in any way you are not 100% satisfied,  please tell us.  If 100% better, tell your friends!

## 2013-03-23 NOTE — Progress Notes (Signed)
PFT done today. 

## 2013-03-25 NOTE — Assessment & Plan Note (Signed)
   Most likely this is  Classic Upper airway cough syndrome, so named because it's frequently impossible to sort out how much is  CR/sinusitis with freq throat clearing (which can be related to primary GERD)   vs  causing  secondary (" extra esophageal")  GERD from wide swings in gastric pressure that occur with throat clearing, often  promoting self use of mint and menthol lozenges that reduce the lower esophageal sphincter tone and exacerbate the problem further in a cyclical fashion.   These are the same pts (now being labeled as having "irritable larynx syndrome" by some cough centers and sometimes respond well to elavil) who not infrequently have a history of having failed to tolerate ace inhibitors,  dry powder inhalers or biphosphonates or report having atypical reflux symptoms that don't respond to standard doses of PPI , and are easily confused as having aecopd or asthma flares by even experienced allergists/ pulmonologists.   Elavil is an excellent choice for him but he'll need to give it a fair try and titrate up.

## 2013-03-25 NOTE — Assessment & Plan Note (Signed)
Aug 09, 2010: ENT repeat biopsys: non-necrotizing granuloma (PCR negative for AFB and fungal stains negative) Sep 02, 2010: Started prednisone 60mg  per day for Dx of SARCOIDOSIS with improvement in cough Early Jan 2012: Reduced prednisone to 20mg  per day PFts 12/04/2010: FVC 4.1L/80%, fev1 2.8L/76%, TLC 79%, DLCO 23.5/91% December 18, 2010: REduce prednisone to 10mg  per day 04/23/2011: No change in PFTs. Suspect cough is due to LPR and not sarcoid -> cut prednisone down to 5mg   07/18/11: PFT normal. Continue prednisone 01/22/2012 Reduce prednsione to 5 mg per day > reduce to 2.5 mg per day 03/06/2012  03/06/12 PFT's wnl > try qod effective 06/17/2012 > d/c 12/09/12 s flare 03/23/2013 PFT's  wnl off all inhalers DLCO 64  Corrects to 84   I had an extended summary  discussion with the patient today lasting 15 to 20 minutes of a 25 minute visit on the following issues:  Unlike when you get a prescription for eyeglasses, it's not possible to always walk out of this or any medical office with a perfect prescription that is immediately effective  based on any test that we offer here.  ( he wasn't happy with his glasses either, it turns out!)  On the contrary, it may take several weeks for the full impact of changes recommened today - hopefully you will respond well.  If not, then we'll adjust your medication on your next visit accordingly, knowing more then than we can possibly know now.      Since his pft's are nl and symptoms have leveled out at acceptable level off prednisone and inhalers rec pulmonary f/u prn

## 2013-03-26 ENCOUNTER — Encounter: Payer: Self-pay | Admitting: Family Medicine

## 2013-03-26 ENCOUNTER — Ambulatory Visit (INDEPENDENT_AMBULATORY_CARE_PROVIDER_SITE_OTHER): Payer: BC Managed Care – PPO | Admitting: Family Medicine

## 2013-03-26 VITALS — BP 110/76 | HR 68 | Temp 97.3°F | Wt 171.0 lb

## 2013-03-26 DIAGNOSIS — D869 Sarcoidosis, unspecified: Secondary | ICD-10-CM

## 2013-03-26 DIAGNOSIS — K219 Gastro-esophageal reflux disease without esophagitis: Secondary | ICD-10-CM

## 2013-03-26 DIAGNOSIS — E790 Hyperuricemia without signs of inflammatory arthritis and tophaceous disease: Secondary | ICD-10-CM | POA: Insufficient documentation

## 2013-03-26 DIAGNOSIS — R7989 Other specified abnormal findings of blood chemistry: Secondary | ICD-10-CM

## 2013-03-26 DIAGNOSIS — G63 Polyneuropathy in diseases classified elsewhere: Secondary | ICD-10-CM

## 2013-03-26 DIAGNOSIS — E785 Hyperlipidemia, unspecified: Secondary | ICD-10-CM | POA: Insufficient documentation

## 2013-03-26 DIAGNOSIS — D8689 Sarcoidosis of other sites: Secondary | ICD-10-CM

## 2013-03-26 DIAGNOSIS — C61 Malignant neoplasm of prostate: Secondary | ICD-10-CM

## 2013-03-26 LAB — COMPLETE METABOLIC PANEL WITH GFR
ALT: 16 U/L (ref 0–53)
AST: 17 U/L (ref 0–37)
Albumin: 3.8 g/dL (ref 3.5–5.2)
Alkaline Phosphatase: 63 U/L (ref 39–117)
BUN: 12 mg/dL (ref 6–23)
CO2: 28 mEq/L (ref 19–32)
Calcium: 9.9 mg/dL (ref 8.4–10.5)
Chloride: 105 mEq/L (ref 96–112)
Creat: 1.15 mg/dL (ref 0.50–1.35)
GFR, Est African American: 82 mL/min
GFR, Est Non African American: 71 mL/min
Glucose, Bld: 80 mg/dL (ref 70–99)
Potassium: 5.1 mEq/L (ref 3.5–5.3)
Sodium: 140 mEq/L (ref 135–145)
Total Bilirubin: 0.6 mg/dL (ref 0.3–1.2)
Total Protein: 6.7 g/dL (ref 6.0–8.3)

## 2013-03-26 LAB — URIC ACID: Uric Acid, Serum: 6.7 mg/dL — ABNORMAL HIGH (ref 4.0–6.0)

## 2013-03-26 NOTE — Patient Instructions (Addendum)
      Dr Julia Kulzer's Recommendations  Diet and Exercise discussed with patient.  For nutrition information, I recommend books:  1).Eat to Live by Dr Joel Fuhrman. 2).Prevent and Reverse Heart Disease by Dr Caldwell Esselstyn. 3) Dr Neal Barnard's Book:  Program to Reverse Diabetes  Exercise recommendations are:  If unable to walk, then the patient can exercise in a chair 3 times a day. By flapping arms like a bird gently and raising legs outwards to the front.  If ambulatory, the patient can go for walks for 30 minutes 3 times a week. Then increase the intensity and duration as tolerated.  Goal is to try to attain exercise frequency to 5 times a week.  If applicable: Best to perform resistance exercises (machines or weights) 2 days a week and cardio type exercises 3 days per week.  

## 2013-03-26 NOTE — Progress Notes (Signed)
Patient ID: Philip Hernandez, male   DOB: 09-16-1956, 57 y.o.   MRN: [id number] SUBJECTIVE: CC: Chief Complaint  Patient presents with  . Follow-up    2 month follow up saw dr wert on monday and doing well and was told to take the elavil as well as was recommended by dr Pearlean Brownie to take     HPI: Evaluated by neurology and pulmonary. Dr Sherene Sires adjusted medications and he understands there is nothing else to do at present since the Sarcoidosis is stable. Also, understands that the neuropathy is probably from the Sarcoidosis. Came for Follow up of other problems.  Past Medical History  Diagnosis Date  . Sarcoidosis dec 2011    stage 2 pulmonary, neck nodes, splenic granuloma  . GERD (gastroesophageal reflux disease) Jan 2012    Dr Arlyce Dice. Bx proven.  Deve abd pain in Jan 2012 following high dose steroids for sarcoid. s/p EGD and improvement in symptoms following PPI  . Malignant neoplasm of prostate dec 2010    Dr Heloise Purpura. s/p surgery dec 2010, hx of rnormal PSA  since then  . Cough     chronic sinus issues, GERD with poor dietary control and Pulmonary Sarcoid.   Marland Kitchen Swelling, mass, or lump in head and neck   . Other and unspecified mycoses   . Painful respiration   . Hypogonadism male   . Colon polyp   . Kidney stone   . Peripheral neuropathy    Past Surgical History  Procedure Laterality Date  . Orif clavicle fracture      right  . Shoulder surgery      right  . Hernia repair  13 yrs ago  . Prostate surgery    . Nasal sinus surgery     History   Social History  . Marital Status: Married    Spouse Name: N/A    Number of Children: N/A  . Years of Education: N/A   Occupational History  . unemployed    Social History Main Topics  . Smoking status: Never Smoker   . Smokeless tobacco: Never Used  . Alcohol Use: No  . Drug Use: No  . Sexually Active: Not on file   Other Topics Concern  . Not on file   Social History Narrative   Married   2 children   Family  History  Problem Relation Age of Onset  . Colon cancer    . Cancer Mother     colon  . Cancer Brother     colon   Current Outpatient Prescriptions on File Prior to Visit  Medication Sig Dispense Refill  . acetaminophen (TYLENOL) 500 MG tablet Take 500 mg by mouth every 6 (six) hours as needed.      Marland Kitchen b complex vitamins tablet Take 1 tablet by mouth daily.      . cetirizine (ZYRTEC) 10 MG tablet Take 10 mg by mouth daily.      . cholecalciferol (VITAMIN D) 1000 UNITS tablet Take 1,000 Units by mouth daily.        . fluticasone (FLONASE) 50 MCG/ACT nasal spray Place 2 sprays into the nose daily.  16 g  1  . gabapentin (NEURONTIN) 800 MG tablet Take 1 tablet (800 mg total) by mouth 4 (four) times daily.  120 tablet  6  . omeprazole (PRILOSEC) 40 MG capsule Take 1 capsule (40 mg total) by mouth daily.  30 capsule  5  . amitriptyline (ELAVIL) 25 MG tablet Take 1 tablet (25  mg total) by mouth at bedtime.  30 tablet  3  . [DISCONTINUED] Sodium Chloride-Sodium Bicarb 1.57 G PACK Use as directed once every am       No current facility-administered medications on file prior to visit.   No Known Allergies Immunization History  Administered Date(s) Administered  . Influenza Whole 08/29/2010   Prior to Admission medications   Medication Sig Start Date End Date Taking? Authorizing Provider  acetaminophen (TYLENOL) 500 MG tablet Take 500 mg by mouth every 6 (six) hours as needed.   Yes Historical Provider, MD  b complex vitamins tablet Take 1 tablet by mouth daily.   Yes Historical Provider, MD  cetirizine (ZYRTEC) 10 MG tablet Take 10 mg by mouth daily.   Yes Historical Provider, MD  cholecalciferol (VITAMIN D) 1000 UNITS tablet Take 1,000 Units by mouth daily.     Yes Historical Provider, MD  fluticasone (FLONASE) 50 MCG/ACT nasal spray Place 2 sprays into the nose daily. 12/08/12  Yes Ileana Ladd, MD  gabapentin (NEURONTIN) 800 MG tablet Take 1 tablet (800 mg total) by mouth 4 (four) times  daily. 02/25/13  Yes Ronal Fear, NP  omeprazole (PRILOSEC) 40 MG capsule Take 1 capsule (40 mg total) by mouth daily. 12/16/12 12/16/13 Yes Nyoka Cowden, MD  amitriptyline (ELAVIL) 25 MG tablet Take 1 tablet (25 mg total) by mouth at bedtime. 02/25/13   Ronal Fear, NP     ROS: As above in the HPI. All other systems are stable or negative.  OBJECTIVE: APPEARANCE:  Patient in no acute distress.The patient appeared well nourished and normally developed. Acyanotic. Waist: VITAL SIGNS:BP 110/76  Pulse 68  Temp(Src) 97.3 F (36.3 C) (Oral)  Wt 171 lb (77.565 kg)  BMI 23.86 kg/m2 WM  SKIN: warm and  Dry without overt rashes, tattoos and scars  HEAD and Neck: without JVD, Head and scalp: normal Eyes:No scleral icterus. Fundi normal, eye movements normal. Ears: Auricle normal, canal normal, Tympanic membranes normal, insufflation normal. Nose: normal Throat: normal Neck & thyroid: normal  CHEST & LUNGS: Chest wall: normal Lungs: Coarse breath soundsr  CVS: Reveals the PMI to be normally located. Regular rhythm, First and Second Heart sounds are normal,  absence of murmurs, rubs or gallops. Peripheral vasculature: Radial pulses: normal Dorsal pedis pulses: normal Posterior pulses: normal  ABDOMEN:  Appearance: normal Benign, no organomegaly, no masses, no Abdominal Aortic enlargement. No Guarding , no rebound. No Bruits. Bowel sounds: normal  RECTAL: N/A GU: N/A  EXTREMETIES: nonedematous. Both Femoral and Pedal pulses are normal.  MUSCULOSKELETAL:  Spine: normal Joints: intact  NEUROLOGIC: oriented to time,place and person; nonfocal. Strength is normal Sensory is abnormal numbness and tingling of the upper and lower extremities. Reflexes are normal Cranial Nerves are normal.   Results for orders placed in visit on 12/08/12  COMPLETE METABOLIC PANEL WITH GFR      Result Value Range   Sodium 139  135 - 145 mEq/L   Potassium 4.9  3.5 - 5.3 mEq/L   Chloride 104   96 - 112 mEq/L   CO2 25  19 - 32 mEq/L   Glucose, Bld 84  70 - 99 mg/dL   BUN 17  6 - 23 mg/dL   Creat 1.61  0.96 - 0.45 mg/dL   Total Bilirubin 1.3 (*) 0.3 - 1.2 mg/dL   Alkaline Phosphatase 65  39 - 117 U/L   AST 16  0 - 37 U/L   ALT 14  0 -  53 U/L   Total Protein 7.0  6.0 - 8.3 g/dL   Albumin 3.9  3.5 - 5.2 g/dL   Calcium 9.7  8.4 - 40.9 mg/dL   GFR, Est African American 80     GFR, Est Non African American 69    NMR LIPOPROFILE WITH LIPIDS      Result Value Range   LDL Particle Number [id number] (*) <1000 nmol/L   LDL (calc) 103 (*) <100 mg/dL   HDL-C 37 (*) >=81 mg/dL   Triglycerides 90  <191 mg/dL   Cholesterol, Total 478  <200 mg/dL   HDL Particle Number 29.5 (*) >=30.5 umol/L   Large HDL-P 1.4 (*) >=4.8 umol/L   Large VLDL-P 1.4  <=2.7 nmol/L   Small LDL Particle Number [id number] (*) <=527 nmol/L   LDL Size 20.3 (*) >20.5 nm   HDL Size 8.7 (*) >=9.2 nm   VLDL Size 42.3  >=46.6 nm   LP-IR Score 50 (*) <=45  THYROID PANEL WITH TSH      Result Value Range   T4, Total 7.2  5.0 - 12.5 ug/dL   T3 Uptake 62.1  30.8 - 37.0 %   Free Thyroxine Index 2.1  1.0 - 3.9   TSH 2.428  0.350 - 4.500 uIU/mL  VITAMIN D 25 HYDROXY      Result Value Range   Vit D, 25-Hydroxy 46  30 - 89 ng/mL  URIC ACID      Result Value Range   Uric Acid, Serum 6.7 (*) 4.0 - 6.0 mg/dL    ASSESSMENT: Sarcoidosis  Peripheral neuropathy in sarcoidosis  GERD  ADENOCARCINOMA, PROSTATE  HLD (hyperlipidemia) - Plan: NMR Lipoprofile with Lipids, COMPLETE METABOLIC PANEL WITH GFR  Hyperuricemia - Plan: Uric acid  PLAN:       Dr Woodroe Mode Recommendations  Diet and Exercise discussed with patient.  For nutrition information, I recommend books:  1).Eat to Live by Dr Monico Hoar. 2).Prevent and Reverse Heart Disease by Dr Suzzette Righter. 3) Dr Katherina Right Book:  Program to Reverse Diabetes  Exercise recommendations are:  If unable to walk, then the patient can exercise in a chair 3 times  a day. By flapping arms like a bird gently and raising legs outwards to the front.  If ambulatory, the patient can go for walks for 30 minutes 3 times a week. Then increase the intensity and duration as tolerated.  Goal is to try to attain exercise frequency to 5 times a week.  If applicable: Best to perform resistance exercises (machines or weights) 2 days a week and cardio type exercises 3 days per week.  Orders Placed This Encounter  Procedures  . Uric acid  . NMR Lipoprofile with Lipids  . COMPLETE METABOLIC PANEL WITH GFR   Same medications. Follow up with the specialists as recommended. Follow up with Urology as he has planned.  Reviewed records in [facility] from Dr Pearlean Brownie and DR Sherene Sires.  Return in about 4 months (around 07/27/2013) for Recheck medical problems.  Mone Commisso P. [place] Charon, M.D.

## 2013-03-27 LAB — NMR LIPOPROFILE WITH LIPIDS
Cholesterol, Total: 150 mg/dL (ref <200)
HDL Particle Number: 22.6 umol/L — ABNORMAL LOW (ref >=30.5)
HDL Size: 8.6 nm — ABNORMAL LOW (ref >=9.2)
HDL-C: 27 mg/dL — ABNORMAL LOW (ref >=40)
LDL (calc): 89 mg/dL (ref <100)
LDL Particle Number: 1606 nmol/L — ABNORMAL HIGH (ref <1000)
LDL Size: 19.6 nm — ABNORMAL LOW (ref >20.5)
LP-IR Score: 87 — ABNORMAL HIGH (ref <=45)
Large HDL-P: 2.3 umol/L — ABNORMAL LOW (ref >=4.8)
Large VLDL-P: 8.1 nmol/L — ABNORMAL HIGH (ref <=2.7)
Small LDL Particle Number: 1378 nmol/L — ABNORMAL HIGH (ref <=527)
Triglycerides: 172 mg/dL — ABNORMAL HIGH (ref <150)
VLDL Size: 53.9 nm — ABNORMAL HIGH (ref <=46.6)

## 2013-04-03 ENCOUNTER — Encounter: Payer: Self-pay | Admitting: Internal Medicine

## 2013-04-13 ENCOUNTER — Ambulatory Visit (INDEPENDENT_AMBULATORY_CARE_PROVIDER_SITE_OTHER): Payer: BC Managed Care – PPO | Admitting: Pharmacist

## 2013-04-13 ENCOUNTER — Encounter: Payer: Self-pay | Admitting: Pharmacist

## 2013-04-13 VITALS — BP 116/70 | HR 70 | Ht 71.0 in | Wt 172.0 lb

## 2013-04-13 DIAGNOSIS — R7989 Other specified abnormal findings of blood chemistry: Secondary | ICD-10-CM

## 2013-04-13 DIAGNOSIS — E785 Hyperlipidemia, unspecified: Secondary | ICD-10-CM

## 2013-04-13 DIAGNOSIS — E790 Hyperuricemia without signs of inflammatory arthritis and tophaceous disease: Secondary | ICD-10-CM

## 2013-04-13 NOTE — Progress Notes (Signed)
Lipid Clinic Consultation  Chief Complaint:   Chief Complaint  Patient presents with  . Hyperlipidemia    Filed Vitals:   04/13/13 1259  BP: 116/70  Pulse: 70    Exam Edema:  negative Respirations:  normal   Carotid Bruits:  negative Xanthomas:  negative General Appearance:  alert, oriented, no acute distress, well nourished and positive abdominal obesity Mood/Affect:  normal  HPI:  Patient's first visit to lipid clinic. Has never taken medication for lipids.  No CV disease in first degree relatives.  Paternal grandfather had a stroke.  Positive family history of colon cancer - mother and brother.  Labs also showed elevated Uric acid.     Component Value Date/Time   TRIG 172* 03/26/2013 1220   LDL-P = 1606 HDL = 27  Assessment: CHD/CHF Risk Equivalents:  none  NCEP Risk Factors Present:  low HDL and age Primary Problem(s):  LDL or LDL-P elevated, HDL or HDL-P decreased and TG elevated  Current NCEP Goals: LDL Goal < 100 HDL Goal >/= 40 Tg Goal < 295 Non-HDL Goal < 130  Low fat diet followed?  No - has recently decreased dairy products, 2-3 servings of juice daily and eats lots of red meat Low carb diet followed?  No -  Exercise?  No - limited by neuropathy  1. Dyslipidemia 2. Elevated uric acid  Recommendations: Changes in lipid medication(s):  None added today Discusses diet in depth - suggested limiting high purine containing foods - specifically red meat and shell fish for elevated uric acid level.   Limit high fat foods, increase whole grains, fruits and vegetables.  Start exercise 10 minutes daily and work up to 30 minutes daily as tolerated - suggested try biking.  Recheck Lipid Panel:  October 2014   Other labs needed:  Uric acid  Time spent counseling patient:  45 minutes   Referring Provider:  [place] Charon PharmD:  Henrene Pastor, PHARMD

## 2013-05-27 ENCOUNTER — Encounter: Payer: Self-pay | Admitting: Nurse Practitioner

## 2013-05-27 ENCOUNTER — Ambulatory Visit (INDEPENDENT_AMBULATORY_CARE_PROVIDER_SITE_OTHER): Payer: BC Managed Care – PPO | Admitting: Nurse Practitioner

## 2013-05-27 VITALS — BP 129/93 | HR 66 | Temp 97.8°F | Ht 72.5 in | Wt 165.0 lb

## 2013-05-27 DIAGNOSIS — D869 Sarcoidosis, unspecified: Secondary | ICD-10-CM

## 2013-05-27 DIAGNOSIS — R209 Unspecified disturbances of skin sensation: Secondary | ICD-10-CM

## 2013-05-27 DIAGNOSIS — M25559 Pain in unspecified hip: Secondary | ICD-10-CM

## 2013-05-27 DIAGNOSIS — R200 Anesthesia of skin: Secondary | ICD-10-CM

## 2013-05-27 DIAGNOSIS — M25552 Pain in left hip: Secondary | ICD-10-CM

## 2013-05-27 DIAGNOSIS — G629 Polyneuropathy, unspecified: Secondary | ICD-10-CM

## 2013-05-27 DIAGNOSIS — G589 Mononeuropathy, unspecified: Secondary | ICD-10-CM

## 2013-05-27 NOTE — Progress Notes (Signed)
I reviewed note and agree with plan.   Suanne Marker, MD 05/27/2013, 1:39 PM Certified in Neurology, Neurophysiology and Neuroimaging  [facility] [address] [place], [place] [address] [phone]

## 2013-05-27 NOTE — Patient Instructions (Addendum)
Continue Gabapentin as prescribed.  Continue Elavil but cut tablets in 1/2 and try to consistent take every night.  Give trial of 4-6 weeks.  If not helping sleep and lessen neuropathy pain after 6 weeks, discontinue.  Follow up in 6 months, sooner as needed.

## 2013-05-27 NOTE — Progress Notes (Signed)
[facility]  PATIENT: Philip Hernandez DOB: 24-Sep-1955   REASON FOR VISIT: follow up HISTORY FROM: patient  HISTORY OF PRESENT ILLNESS: 57 year old Caucasian male with history of bilateral hands and feet paresthesias and weakness since March 2013 likely from peripheral neuropathy returns for followup. Numbness continues, no relief. No treatable cause identified. Neuropathy may be likely to his underlying sarcoidosis. He has been decreased with his prednisone.   Update 11/19/2012: He returns for followup after last visit on 07/14/2012. He states his paresthesias and tingling are unchanged and about the same. He states that gabapentin does help him but the effect lasted only about 5-6 hours. He is tolerating it well without any significant side effects. He states his circumflex doses is stable and he is considering seeing his pulmonologist to discuss tapering and stopping his prednisone. He is complaining of left hip localized pain which he feels started after the EMG study. His pain is intermittent and at times sharp. Tylenol relieves the pain. He also complains of decreased stamina and gets higher easily and feels he will be unable to go back to work because of his pain and lack of stamina. He plans to apply for disability.   Update 02/25/2013: He returns for followup since his last visit on 11/19/2012. He states his paresthesias and tingling are unchanged and about the same. He also complains of shooting pain in his toes. His gabapentin was increased at last visit and he states it does help him. He complains of increased muscle weakness in bilateral legs. He states that his balance is a problem but he has not fallen. He knows he has to walk slowly and steady and not make any sudden movements. Sometimes when he is down on the ground it difficult for him to get up by himself. He has applied for disability at the beginning of May. His prednisone was completely tapered in February with  good results.   UPDATE 05/27/13: Philip Hernandez returns for 3 month follow up visit for neuropathy.  He feels like his symptoms are stable and have not progressed.  He tried Elavil after last visit sporadically, and taking a whole tablet instead of 1/2 tablet as recommended and said it made him very lethargic the next day after he took it.  Three days ago he started taking 1/2 tablet each night and is tolerating much better so far.  He thinks he is regaining some muscle strength back in his legs. He states that his feet are very painful the next day after if he walks a lot in a day.  He is tolerating the Gabapentin well.  He complains of hip pain.  Xray showed bilateral degenerative changes.   REVIEW OF SYSTEMS: Full 14 system review of systems performed and notable only for fatigue, blurred vision, short of breath, hearing loss, aching muscles, cramps, allergies, numbness  ALLERGIES: No Known Allergies  HOME MEDICATIONS: Outpatient Prescriptions Prior to Visit  Medication Sig Dispense Refill  . acetaminophen (TYLENOL) 500 MG tablet Take 500 mg by mouth every 6 (six) hours as needed.      Marland Kitchen amitriptyline (ELAVIL) 25 MG tablet Take 1 tablet (25 mg total) by mouth at bedtime.  30 tablet  3  . b complex vitamins tablet Take 1 tablet by mouth daily.      . cetirizine (ZYRTEC) 10 MG tablet Take 10 mg by mouth daily.      . cholecalciferol (VITAMIN D) 1000 UNITS tablet Take 1,000 Units by mouth daily.        Marland Kitchen  fluticasone (FLONASE) 50 MCG/ACT nasal spray Place 2 sprays into the nose daily.  16 g  1  . gabapentin (NEURONTIN) 800 MG tablet Take 1 tablet (800 mg total) by mouth 4 (four) times daily.  120 tablet  6  . omeprazole (PRILOSEC) 40 MG capsule Take 1 capsule (40 mg total) by mouth daily.  30 capsule  5   No facility-administered medications prior to visit.    PAST MEDICAL HISTORY: Past Medical History  Diagnosis Date  . Sarcoidosis dec 2011    stage 2 pulmonary, neck nodes, splenic granuloma    . GERD (gastroesophageal reflux disease) Jan 2012    Dr Arlyce Dice. Bx proven.  Deve abd pain in Jan 2012 following high dose steroids for sarcoid. s/p EGD and improvement in symptoms following PPI  . Malignant neoplasm of prostate dec 2010    Dr Heloise Purpura. s/p surgery dec 2010, hx of rnormal PSA  since then  . Cough     chronic sinus issues, GERD with poor dietary control and Pulmonary Sarcoid.   Marland Kitchen Swelling, mass, or lump in head and neck   . Other and unspecified mycoses   . Painful respiration   . Hypogonadism male   . Colon polyp   . Kidney stone   . Peripheral neuropathy     PAST SURGICAL HISTORY: Past Surgical History  Procedure Laterality Date  . Orif clavicle fracture      right  . Shoulder surgery      right  . Hernia repair  13 yrs ago  . Prostate surgery    . Nasal sinus surgery      FAMILY HISTORY: Family History  Problem Relation Age of Onset  . Colon cancer    . Cancer Mother     colon  . Cancer Brother     colon    SOCIAL HISTORY: History   Social History  . Marital Status: Married    Spouse Name: N/A    Number of Children: N/A  . Years of Education: N/A   Occupational History  . unemployed    Social History Main Topics  . Smoking status: Never Smoker   . Smokeless tobacco: Never Used  . Alcohol Use: No  . Drug Use: No  . Sexual Activity: Not on file   Other Topics Concern  . Not on file   Social History Narrative   Married   2 children     PHYSICAL EXAM  There were no vitals filed for this visit. There is no weight on file to calculate BMI.  GENERAL EXAM:  Pleasant middle aged Caucasian male, in no distress. Afebrile.  Head: Nontraumatic.  Ears, Nose and throat: Hearing is normal.  Neck: Supple without bruit.  Respiratory: CTA  Musculoskeletal: no deformity. Tenderness left hip lateral aspect. No visible erythema.  Skin: no rash. Old surgical scar on neck from lymph node removal.   CARDIOVASCULAR:  Regular rate and  rhythm, no murmurs, no carotid bruits   NEUROLOGIC:  MENTAL STATUS: awake, alert, language fluent, comprehension intact, naming intact  CRANIAL NERVE: no papilledema on fundoscopic exam, pupils equal and reactive to light, visual fields full to confrontation, extraocular muscles intact, no nystagmus, facial sensation and strength symmetric, uvula midline, shoulder shrug symmetric, tongue midline.  MOTOR: normal bulk and tone, full strength in the BUE, BLE. Reveals no upper or lower extremity drift. Mild weakness in bilateral ankle dorsiflexors, right more than left  SENSORY: Touch and pinprick sensations are diminished at toes bilaterally  and to ankle on right and over fingertips bilaterally. Impaired vibration sense of his toes and fingertips bilaterally. Position sense is preserved.  COORDINATION: finger-nose-finger, fine finger movements normal  REFLEXES: deep tendon reflexes present and symmetric  GAIT/STATION: Slow cautious steady gait. Able to do tandem walk with mild difficulty. Able to stand on toes and heels with moderate difficulty. romberg is negative  DIAGNOSTIC DATA (LABS, IMAGING, TESTING) - I reviewed patient records, labs, notes, testing and imaging myself where available.  Lab Results  Component Value Date   WBC 10.5 12/10/2011   HGB 12.0* 12/10/2011   HCT 36.8* 12/10/2011   MCV 84.0 12/10/2011   PLT 153.0 12/10/2011      Component Value Date/Time   NA 140 03/26/2013 1220   K 5.1 03/26/2013 1220   CL 105 03/26/2013 1220   CO2 28 03/26/2013 1220   GLUCOSE 80 03/26/2013 1220   BUN 12 03/26/2013 1220   CREATININE 1.15 03/26/2013 1220   CREATININE 1.1 12/10/2011 1024   CALCIUM 9.9 03/26/2013 1220   PROT 6.7 03/26/2013 1220   ALBUMIN 3.8 03/26/2013 1220   AST 17 03/26/2013 1220   ALT 16 03/26/2013 1220   ALKPHOS 63 03/26/2013 1220   BILITOT 0.6 03/26/2013 1220   GFRNONAA >60 08/07/2010 0921   GFRAA  Value: >60        The eGFR has been calculated using the MDRD equation. This  calculation has not been validated in all clinical situations. eGFR's persistently <60 mL/min signify possible Chronic Kidney Disease. 08/07/2010 0921   Lab Results  Component Value Date   LDLCALC 89 03/26/2013   TRIG 172* 03/26/2013   No results found for this basename: HGBA1C   No results found for this basename: VITAMINB12   Lab Results  Component Value Date   TSH 2.428 12/08/2012    01/08/13 NCV/EMG Nerve conduction studies done on both lower extremities and the left upper extremity shows evidence of primarily motor involvement of the nerves in the legs, normal on the left arm. EMG evaluation of the left lower extremity shows mild primarily distal acute and chronic denervation, consistent with a peripheral neuropathy. There is no evidence of nerve root involvement. EMG evaluation shows evidence of a mononeuropathy affecting the median nerve proximally, again without evidence of a cervical radiculopathy. In the appropriate clinical setting, the study could be consistent with Guillian-Barre syndrome, but some chronic features of denervation also seen. Sarcoidosis may be another consideration, but no sensory involvement is noted.  ASSESSMENT AND PLAN 57 year old Caucasian right-handed male with history of bilateral hands and feet paresthesias and weakness since March 2013 likely from peripheral neuropathy returns for followup. Numbness continues, with mild relief with gabapentin. No treatable cause is identified. Neuropathy may likely be related to his underlying sarcoidosis. His prednisone was stopped in February.   Plan:  1. Continue gabapentin 800 mg 4 times a day.  2. Continue Elavil but cut tablets in 1/2 and try to consistent take every night.  Give trial of 4-6 weeks.  If not helping sleep and lessen neuropathy pain after 6 weeks, discontinue. 3. patient is unable to return back to work in his present clinical situation due to pain and lack of stamina.  4. return for followup in 6  months.  Ronal Fear, MSN, NP-C 05/27/2013, 11:27 AM [facility] [address], [address] [place], [place] [address] [phone]

## 2013-06-02 ENCOUNTER — Encounter: Payer: Self-pay | Admitting: *Deleted

## 2013-06-05 ENCOUNTER — Other Ambulatory Visit: Payer: Self-pay

## 2013-06-05 MED ORDER — GABAPENTIN 800 MG PO TABS: 800.0000 mg | ORAL_TABLET | Freq: Four times a day (QID) | ORAL | Status: DC

## 2013-06-05 NOTE — Telephone Encounter (Signed)
Patient called requesting a refill on Gabapentin.  He would like the Rx sent to [facility] for 90 days supply, rather than 30 days at local pharmacy.

## 2013-06-26 ENCOUNTER — Encounter (INDEPENDENT_AMBULATORY_CARE_PROVIDER_SITE_OTHER): Payer: Self-pay

## 2013-06-26 ENCOUNTER — Other Ambulatory Visit (INDEPENDENT_AMBULATORY_CARE_PROVIDER_SITE_OTHER): Payer: BC Managed Care – PPO

## 2013-06-26 DIAGNOSIS — E790 Hyperuricemia without signs of inflammatory arthritis and tophaceous disease: Secondary | ICD-10-CM

## 2013-06-26 DIAGNOSIS — E785 Hyperlipidemia, unspecified: Secondary | ICD-10-CM

## 2013-06-26 NOTE — Progress Notes (Signed)
Pt came in for labs only 

## 2013-06-27 LAB — URIC ACID: Uric Acid: 7.6 mg/dL (ref 3.7–8.6)

## 2013-06-28 LAB — NMR, LIPOPROFILE
Cholesterol: 152 mg/dL (ref <200)
HDL Cholesterol by NMR: 32 mg/dL — ABNORMAL LOW (ref >=40)
HDL Particle Number: 23.8 umol/L — ABNORMAL LOW (ref >=30.5)
LDL Particle Number: 1305 nmol/L — ABNORMAL HIGH (ref <1000)
LDL Size: 20.6 nm (ref >20.5)
LDLC SERPL CALC-MCNC: 102 mg/dL — ABNORMAL HIGH (ref <100)
LP-IR Score: 46 — ABNORMAL HIGH (ref <=45)
Small LDL Particle Number: 601 nmol/L — ABNORMAL HIGH (ref <=527)
Triglycerides by NMR: 91 mg/dL (ref <150)

## 2013-06-28 NOTE — Progress Notes (Signed)
Quick Note:  Labs abnormal. LDLp a little better. The Uric acid is not visible to me. Lab needs to track down the Uric acid Result. Patient needs to see Tammy for medication adjustment ______

## 2013-07-02 ENCOUNTER — Telehealth: Payer: Self-pay | Admitting: Pharmacist

## 2013-07-02 NOTE — Telephone Encounter (Signed)
Called patient with lab results.  LDL-P decreased from 1606 to 1305 HDL increased from 27 to 32 Triglycerides decreased form 172 to 91 Total cholesterol increased from 150 to 152. Recommended continue TLC and recheck lipid panel in 3 months.

## 2013-07-02 NOTE — Telephone Encounter (Signed)
Message copied by Henrene Pastor on Thu Jul 02, 2013  2:58 PM ------      Message from: Ileana Ladd      Created: Sun Jun 28, 2013  5:48 PM       Labs abnormal.      LDLp a little better.      The Uric acid is not visible to me.      Lab needs to track down the Uric acid  Result.      Patient needs to see Shuntell Foody for medication adjustment ------

## 2013-07-03 ENCOUNTER — Other Ambulatory Visit: Payer: Self-pay | Admitting: Family Medicine

## 2013-07-03 DIAGNOSIS — E790 Hyperuricemia without signs of inflammatory arthritis and tophaceous disease: Secondary | ICD-10-CM

## 2013-07-03 MED ORDER — ALLOPURINOL 100 MG PO TABS: 100.0000 mg | ORAL_TABLET | Freq: Every day | ORAL | Status: DC

## 2013-07-10 ENCOUNTER — Encounter: Payer: Self-pay | Admitting: Gastroenterology

## 2013-07-24 ENCOUNTER — Encounter: Payer: Self-pay | Admitting: Family Medicine

## 2013-07-24 ENCOUNTER — Ambulatory Visit (INDEPENDENT_AMBULATORY_CARE_PROVIDER_SITE_OTHER): Payer: BC Managed Care – PPO | Admitting: Family Medicine

## 2013-07-24 VITALS — BP 116/70 | HR 60 | Temp 97.8°F | Ht 72.5 in | Wt 160.2 lb

## 2013-07-24 DIAGNOSIS — G589 Mononeuropathy, unspecified: Secondary | ICD-10-CM

## 2013-07-24 DIAGNOSIS — R7989 Other specified abnormal findings of blood chemistry: Secondary | ICD-10-CM

## 2013-07-24 DIAGNOSIS — K219 Gastro-esophageal reflux disease without esophagitis: Secondary | ICD-10-CM

## 2013-07-24 DIAGNOSIS — D869 Sarcoidosis, unspecified: Secondary | ICD-10-CM

## 2013-07-24 DIAGNOSIS — Z23 Encounter for immunization: Secondary | ICD-10-CM | POA: Insufficient documentation

## 2013-07-24 DIAGNOSIS — E785 Hyperlipidemia, unspecified: Secondary | ICD-10-CM

## 2013-07-24 DIAGNOSIS — M25559 Pain in unspecified hip: Secondary | ICD-10-CM

## 2013-07-24 DIAGNOSIS — M25552 Pain in left hip: Secondary | ICD-10-CM

## 2013-07-24 DIAGNOSIS — G629 Polyneuropathy, unspecified: Secondary | ICD-10-CM

## 2013-07-24 DIAGNOSIS — E790 Hyperuricemia without signs of inflammatory arthritis and tophaceous disease: Secondary | ICD-10-CM

## 2013-07-24 DIAGNOSIS — C61 Malignant neoplasm of prostate: Secondary | ICD-10-CM

## 2013-07-24 MED ORDER — OMEPRAZOLE 40 MG PO CPDR: 40.0000 mg | DELAYED_RELEASE_CAPSULE | Freq: Every day | ORAL | Status: DC

## 2013-07-24 NOTE — Progress Notes (Signed)
Patient ID: Philip Hernandez, male   DOB: August 16, 1956, 57 y.o.   MRN: [id number] SUBJECTIVE: CC: Chief Complaint  Patient presents with  . Follow-up    4 month follow up     HPI:  Patient is here for follow up of hyperlipidemia/Sarcoidosis/gout: denies Headache;denies Chest Pain;denies weakness;denies Shortness of Breath and orthopnea;denies Visual changes;denies palpitations;denies cough;denies pedal edema;denies symptoms of TIA or stroke;deniesClaudication symptoms. admits to Compliance with medications; denies Problems with medications.  Reluctant to use allopurinol.  Got 2 books on nutrition. Haven't read them yet.  Past Medical History  Diagnosis Date  . Sarcoidosis dec 2011    stage 2 pulmonary, neck nodes, splenic granuloma  . GERD (gastroesophageal reflux disease) Jan 2012    Dr Arlyce Dice. Bx proven.  Deve abd pain in Jan 2012 following high dose steroids for sarcoid. s/p EGD and improvement in symptoms following PPI  . Malignant neoplasm of prostate dec 2010    Dr Heloise Purpura. s/p surgery dec 2010, hx of rnormal PSA  since then  . Cough     chronic sinus issues, GERD with poor dietary control and Pulmonary Sarcoid.   Marland Kitchen Swelling, mass, or lump in head and neck   . Other and unspecified mycoses   . Painful respiration   . Hypogonadism male   . Colon polyp   . Kidney stone   . Peripheral neuropathy    Past Surgical History  Procedure Laterality Date  . Orif clavicle fracture      right  . Shoulder surgery      right  . Hernia repair  13 yrs ago  . Prostate surgery    . Nasal sinus surgery     History   Social History  . Marital Status: Married    Spouse Name: N/A    Number of Children: N/A  . Years of Education: N/A   Occupational History  . unemployed    Social History Main Topics  . Smoking status: Never Smoker   . Smokeless tobacco: Never Used  . Alcohol Use: No  . Drug Use: No  . Sexual Activity: Not on file   Other Topics Concern  . Not on file    Social History Narrative   Married   2 children   Family History  Problem Relation Age of Onset  . Colon cancer    . Cancer Mother     colon  . Cancer Brother     colon   Current Outpatient Prescriptions on File Prior to Visit  Medication Sig Dispense Refill  . acetaminophen (TYLENOL) 500 MG tablet Take 500 mg by mouth every 6 (six) hours as needed.      . cetirizine (ZYRTEC) 10 MG tablet Take 10 mg by mouth daily.      . cholecalciferol (VITAMIN D) 1000 UNITS tablet Take 1,000 Units by mouth daily.        . fluticasone (FLONASE) 50 MCG/ACT nasal spray Place 2 sprays into the nose daily.  16 g  1  . gabapentin (NEURONTIN) 800 MG tablet Take 1 tablet (800 mg total) by mouth 4 (four) times daily.  360 tablet  1  . amitriptyline (ELAVIL) 25 MG tablet Take 1 tablet (25 mg total) by mouth at bedtime.  30 tablet  3  . b complex vitamins tablet Take 1 tablet by mouth daily.      . [DISCONTINUED] Sodium Chloride-Sodium Bicarb 1.57 G PACK Use as directed once every am       No  current facility-administered medications on file prior to visit.   No Known Allergies Immunization History  Administered Date(s) Administered  . Influenza Whole 08/29/2010  . Influenza,inj,Quad PF,36+ Mos 07/24/2013   Prior to Admission medications   Medication Sig Start Date End Date Taking? Authorizing Provider  acetaminophen (TYLENOL) 500 MG tablet Take 500 mg by mouth every 6 (six) hours as needed.    Historical Provider, MD  allopurinol (ZYLOPRIM) 100 MG tablet Take 1 tablet (100 mg total) by mouth daily. 07/03/13   Ileana Ladd, MD  amitriptyline (ELAVIL) 25 MG tablet Take 1 tablet (25 mg total) by mouth at bedtime. 02/25/13   Ronal Fear, NP  b complex vitamins tablet Take 1 tablet by mouth daily.    Historical Provider, MD  cetirizine (ZYRTEC) 10 MG tablet Take 10 mg by mouth daily.    Historical Provider, MD  cholecalciferol (VITAMIN D) 1000 UNITS tablet Take 1,000 Units by mouth daily.       Historical Provider, MD  fluticasone (FLONASE) 50 MCG/ACT nasal spray Place 2 sprays into the nose daily. 12/08/12   Ileana Ladd, MD  gabapentin (NEURONTIN) 800 MG tablet Take 1 tablet (800 mg total) by mouth 4 (four) times daily. 06/05/13   Micki Riley, MD  omeprazole (PRILOSEC) 40 MG capsule Take 1 capsule (40 mg total) by mouth daily. 12/16/12 12/16/13  Nyoka Cowden, MD     ROS: As above in the HPI. All other systems are stable or negative.  OBJECTIVE: APPEARANCE:  Patient in no acute distress.The patient appeared well nourished and normally developed. Acyanotic. Waist: VITAL SIGNS:BP 116/70  Pulse 60  Temp(Src) 97.8 F (36.6 C) (Oral)  Ht 6' 0.5" (1.842 m)  Wt 160 lb 3.2 oz (72.666 kg)  BMI 21.42 kg/m2  WM central obesity  SKIN: warm and  Dry without overt rashes, tattoos and scars  HEAD and Neck: without JVD, Head and scalp: normal Eyes:No scleral icterus. Fundi normal, eye movements normal. Ears: Auricle normal, canal normal, Tympanic membranes normal, insufflation normal. Nose: normal Throat: normal Neck & thyroid: normal  CHEST & LUNGS: Chest wall: normal Lungs: Clear  CVS: Reveals the PMI to be normally located. Regular rhythm, First and Second Heart sounds are normal,  absence of murmurs, rubs or gallops. Peripheral vasculature: Radial pulses: normal Dorsal pedis pulses: normal Posterior pulses: normal  ABDOMEN:  Appearance: central obesity Benign, no organomegaly, no masses, no Abdominal Aortic enlargement. No Guarding , no rebound. No Bruits. Bowel sounds: normal  RECTAL: N/A GU: N/A  EXTREMETIES: nonedematous.  MUSCULOSKELETAL:  Spine: normal Joints: intact  NEUROLOGIC: oriented to time,place and person; nonfocal.  ASSESSMENT: Sarcoidosis  Hyperuricemia - Plan: Uric acid  Need for prophylactic vaccination and inoculation against influenza  ADENOCARCINOMA, PROSTATE  GERD  HLD (hyperlipidemia) - Plan: CMP14+EGFR, NMR,  lipoprofile  Neuropathy  Pain in left hip  PLAN: Spent 45 bminutes answering patient's questions about gout/hyperuricemia and sarcoidosis.       Dr Woodroe Mode Recommendations Exercise recommendations are:  If unable to walk, then the patient can exercise in a chair 3 times a day. By flapping arms like a bird gently and raising legs outwards to the front.  If ambulatory, the patient can go for walks for 30 minutes 3 times a week. Then increase the intensity and duration as tolerated.  Goal is to try to attain exercise frequency to 5 times a week.  If applicable: Best to perform resistance exercises (machines or weights) 2 days a week  and cardio type exercises 3 days per week.  Orders Placed This Encounter  Procedures  . CMP14+EGFR    Standing Status: Future     Number of Occurrences:      Standing Expiration Date: 07/24/2014    Order Specific Question:  Has the patient fasted?    Answer:  Yes  . NMR, lipoprofile    Standing Status: Future     Number of Occurrences:      Standing Expiration Date: 07/24/2014  . Uric acid    Standing Status: Future     Number of Occurrences:      Standing Expiration Date: 07/24/2014   Meds ordered this encounter  Medications  . omeprazole (PRILOSEC) 40 MG capsule    Sig: Take 1 capsule (40 mg total) by mouth daily.    Dispense:  30 capsule    Refill:  5   Medications Discontinued During This Encounter  Medication Reason  . omeprazole (PRILOSEC) 40 MG capsule Reorder  . allopurinol (ZYLOPRIM) 100 MG tablet Patient has not taken in last 30 days  handouts in the AVS on low purine diets.   Return in about 4 months (around 11/21/2013) for Recheck medical problems.  Jemell Town P. Modesto Charon, M.D.

## 2013-07-24 NOTE — Patient Instructions (Signed)
Dr Woodroe Mode Recommendations Exercise recommendations are:  If unable to walk, then the patient can exercise in a chair 3 times a day. By flapping arms like a bird gently and raising legs outwards to the front.  If ambulatory, the patient can go for walks for 30 minutes 3 times a week. Then increase the intensity and duration as tolerated.  Goal is to try to attain exercise frequency to 5 times a week.  If applicable: Best to perform resistance exercises (machines or weights) 2 days a week and cardio type exercises 3 days per week.   Gout Gout is an inflammatory arthritis caused by a buildup of uric acid crystals in the joints. Uric acid is a chemical that is normally present in the blood. When the level of uric acid in the blood is too high it can form crystals that deposit in your joints and tissues. This causes joint redness, soreness, and swelling (inflammation). Repeat attacks are common. Over time, uric acid crystals can form into masses (tophi) near a joint, destroying bone and causing disfigurement. Gout is treatable and often preventable. CAUSES  The disease begins with elevated levels of uric acid in the blood. Uric acid is produced by your body when it breaks down a naturally found substance called purines. Certain foods you eat, such as meats and fish, contain high amounts of purines. Causes of an elevated uric acid level include:  Being passed down from parent to child (heredity).  Diseases that cause increased uric acid production (such as obesity, psoriasis, and certain cancers).  Excessive alcohol use.  Diet, especially diets rich in meat and seafood.  Medicines, including certain cancer-fighting medicines (chemotherapy), water pills (diuretics), and aspirin.  Chronic kidney disease. The kidneys are no longer able to remove uric acid well.  Problems with metabolism. Conditions strongly associated with gout include:  Obesity.  High blood pressure.  High  cholesterol.  Diabetes. Not everyone with elevated uric acid levels gets gout. It is not understood why some people get gout and others do not. Surgery, joint injury, and eating too much of certain foods are some of the factors that can lead to gout attacks. SYMPTOMS   An attack of gout comes on quickly. It causes intense pain with redness, swelling, and warmth in a joint.  Fever can occur.  Often, only one joint is involved. Certain joints are more commonly involved:  Base of the big toe.  Knee.  Ankle.  Wrist.  Finger. Without treatment, an attack usually goes away in a few days to weeks. Between attacks, you usually will not have symptoms, which is different from many other forms of arthritis. DIAGNOSIS  Your caregiver will suspect gout based on your symptoms and exam. In some cases, tests may be recommended. The tests may include:  Blood tests.  Urine tests.  X-rays.  Joint fluid exam. This exam requires a needle to remove fluid from the joint (arthrocentesis). Using a microscope, gout is confirmed when uric acid crystals are seen in the joint fluid. TREATMENT  There are two phases to gout treatment: treating the sudden onset (acute) attack and preventing attacks (prophylaxis).  Treatment of an Acute Attack.  Medicines are used. These include anti-inflammatory medicines or steroid medicines.  An injection of steroid medicine into the affected joint is sometimes necessary.  The painful joint is rested. Movement can worsen the arthritis.  You may use warm or cold treatments on painful joints, depending which works best for you.  Treatment to Prevent Attacks.  If you suffer from frequent gout attacks, your caregiver may advise preventive medicine. These medicines are started after the acute attack subsides. These medicines either help your kidneys eliminate uric acid from your body or decrease your uric acid production. You may need to stay on these medicines for a  very long time.  The early phase of treatment with preventive medicine can be associated with an increase in acute gout attacks. For this reason, during the first few months of treatment, your caregiver may also advise you to take medicines usually used for acute gout treatment. Be sure you understand your caregiver's directions. Your caregiver may make several adjustments to your medicine dose before these medicines are effective.  Discuss dietary treatment with your caregiver or dietitian. Alcohol and drinks high in sugar and fructose and foods such as meat, poultry, and seafood can increase uric acid levels. Your caregiver or dietician can advise you on drinks and foods that should be limited. HOME CARE INSTRUCTIONS   Do not take aspirin to relieve pain. This raises uric acid levels.  Only take over-the-counter or prescription medicines for pain, discomfort, or fever as directed by your caregiver.  Rest the joint as much as possible. When in bed, keep sheets and blankets off painful areas.  Keep the affected joint raised (elevated).  Apply warm or cold treatments to painful joints. Use of warm or cold treatments depends on which works best for you.  Use crutches if the painful joint is in your leg.  Drink enough fluids to keep your urine clear or pale yellow. This helps your body get rid of uric acid. Limit alcohol, sugary drinks, and fructose drinks.  Follow your dietary instructions. Pay careful attention to the amount of protein you eat. Your daily diet should emphasize fruits, vegetables, whole grains, and fat-free or low-fat milk products. Discuss the use of coffee, vitamin C, and cherries with your caregiver or dietician. These may be helpful in lowering uric acid levels.  Maintain a healthy body weight. SEEK MEDICAL CARE IF:   You develop diarrhea, vomiting, or any side effects from medicines.  You do not feel better in 24 hours, or you are getting worse. SEEK IMMEDIATE MEDICAL  CARE IF:   Your joint becomes suddenly more tender, and you have chills or a fever. MAKE SURE YOU:   Understand these instructions.  Will watch your condition.  Will get help right away if you are not doing well or get worse. Document Released: 08/31/2000 Document Revised: 12/29/2012 Document Reviewed: 04/16/2012 [facility] Patient Information 2014 [place], [place].   Purine Restricted Diet A low-purine diet consists of foods that reduce uric acid made in your body. INDICATIONS FOR USE  Your caregiver may ask you to follow a low-purine diet to reduce gout flairs.  GUIDELINES  Avoid high-purine foods, including all alcohol, yeast extracts taken as supplements, and sauces made from meats (like gravy). Do not eat high-purine meats, including anchovies, sardines, herring, mussels, tuna, codfish, scallops, trout, haddock, bacon, organ meats, tripe, goose, wild game, and sweetbreads.  Grains  Allowed/Recommended: All, except those listed to consume in moderation.  Consume in Moderation: Oatmeal ( cup uncooked daily), wheat bran or germ ( cup daily), and whole grains. Vegetables  Allowed/Recommended: All, except those listed to consume in moderation.  Consume in Moderation: Asparagus, cauliflower, spinach, mushrooms, and green peas ( cup daily). Fruit  Allowed/Recommended: All.  Consume in Moderation: None. Meat and Meat Substitutes  Allowed/Recommended: Eggs, nuts, and peanut  butter.  Consume in Moderation: Limit to 4 to 6 oz daily. Avoid high-purine meats. Lentils, peas, and dried beans (1 cup daily). Milk  Allowed/Recommended: All. Choose low-fat or skim when possible.  Consume in Moderation: None. Fats and Oils  Allowed/Recommended: All.  Consume in Moderation: None. Beverages  Allowed/Recommended: All, except those listed to avoid.  Avoid: All alcohol. Condiments/Miscellaneous  Allowed/Recommended: All, except those listed to consume in moderation.  Consume in  Moderation: Bouillon and meat-based broths and soups. Document Released: 12/29/2010 Document Revised: 11/26/2011 Document Reviewed: 12/29/2010 [facility] Patient Information 2014 [place], [place].

## 2013-07-29 ENCOUNTER — Ambulatory Visit: Payer: BC Managed Care – PPO | Admitting: Family Medicine

## 2013-07-31 ENCOUNTER — Encounter: Payer: Self-pay | Admitting: General Practice

## 2013-07-31 ENCOUNTER — Ambulatory Visit (INDEPENDENT_AMBULATORY_CARE_PROVIDER_SITE_OTHER): Payer: BC Managed Care – PPO | Admitting: General Practice

## 2013-07-31 ENCOUNTER — Telehealth: Payer: Self-pay | Admitting: Family Medicine

## 2013-07-31 VITALS — BP 98/64 | HR 66 | Temp 97.3°F | Ht 72.0 in | Wt 162.0 lb

## 2013-07-31 DIAGNOSIS — J329 Chronic sinusitis, unspecified: Secondary | ICD-10-CM

## 2013-07-31 DIAGNOSIS — J029 Acute pharyngitis, unspecified: Secondary | ICD-10-CM

## 2013-07-31 LAB — POCT RAPID STREP A (OFFICE): Rapid Strep A Screen: NEGATIVE

## 2013-07-31 MED ORDER — AZITHROMYCIN 250 MG PO TABS: ORAL_TABLET | ORAL | Status: DC

## 2013-07-31 NOTE — Progress Notes (Signed)
  Subjective:    Patient ID: Philip Hernandez, male    DOB: 06/04/1956, 57 y.o.   MRN: [id number]  Sore Throat  This is a new problem. The current episode started in the past 7 days. The problem has been unchanged. Neither side of throat is experiencing more pain than the other. There has been no fever. The pain is at a severity of 3/10. Associated symptoms include congestion and coughing. Pertinent negatives include no diarrhea, ear pain, headaches or shortness of breath. He has had no exposure to strep or mono. He has tried cool liquids for the symptoms.      Review of Systems  Constitutional: Negative for fever and chills.  HENT: Positive for congestion and sinus pressure. Negative for ear pain.   Respiratory: Positive for cough. Negative for chest tightness and shortness of breath.   Cardiovascular: Negative for chest pain and palpitations.  Gastrointestinal: Negative for diarrhea.  Neurological: Negative for dizziness, weakness and headaches.       Objective:   Physical Exam  Constitutional: He is oriented to person, place, and time. He appears well-developed and well-nourished.  HENT:  Head: Normocephalic and atraumatic.  Right Ear: External ear normal.  Left Ear: External ear normal.  Nose: Right sinus exhibits maxillary sinus tenderness. Left sinus exhibits maxillary sinus tenderness.  Mouth/Throat: Posterior oropharyngeal erythema present.  Cardiovascular: Normal rate, regular rhythm and normal heart sounds.   Pulmonary/Chest: Effort normal and breath sounds normal. No respiratory distress. He exhibits no tenderness.  Neurological: He is alert and oriented to person, place, and time.  Skin: Skin is warm and dry.  Psychiatric: He has a normal mood and affect.          Assessment & Plan:

## 2013-07-31 NOTE — Telephone Encounter (Signed)
appt given for today at 4:10

## 2013-07-31 NOTE — Patient Instructions (Signed)

## 2013-09-07 ENCOUNTER — Ambulatory Visit (AMBULATORY_SURGERY_CENTER): Payer: BC Managed Care – PPO | Admitting: *Deleted

## 2013-09-07 VITALS — Ht 70.0 in | Wt 160.4 lb

## 2013-09-07 DIAGNOSIS — Z86010 Personal history of colonic polyps: Secondary | ICD-10-CM

## 2013-09-07 MED ORDER — NA SULFATE-K SULFATE-MG SULF 17.5-3.13-1.6 GM/177ML PO SOLN
1.0000 | Freq: Once | ORAL | Status: DC
Start: 2013-09-07 — End: 2013-09-30

## 2013-09-07 NOTE — Progress Notes (Signed)
No allergies to eggs or soy. No problems with anesthesia.  

## 2013-09-08 ENCOUNTER — Encounter: Payer: Self-pay | Admitting: Gastroenterology

## 2013-09-17 DIAGNOSIS — K635 Polyp of colon: Secondary | ICD-10-CM

## 2013-09-17 HISTORY — DX: Polyp of colon: K63.5

## 2013-09-28 ENCOUNTER — Encounter: Payer: Self-pay | Admitting: Nurse Practitioner

## 2013-09-30 ENCOUNTER — Ambulatory Visit (AMBULATORY_SURGERY_CENTER): Payer: BC Managed Care – PPO | Admitting: Gastroenterology

## 2013-09-30 ENCOUNTER — Encounter: Payer: BC Managed Care – PPO | Admitting: Gastroenterology

## 2013-09-30 ENCOUNTER — Encounter: Payer: Self-pay | Admitting: Gastroenterology

## 2013-09-30 VITALS — BP 117/72 | HR 59 | Temp 97.5°F | Resp 15 | Ht 70.0 in | Wt 160.0 lb

## 2013-09-30 DIAGNOSIS — Z800 Family history of malignant neoplasm of digestive organs: Secondary | ICD-10-CM

## 2013-09-30 DIAGNOSIS — Z86010 Personal history of colonic polyps: Secondary | ICD-10-CM

## 2013-09-30 MED ORDER — SODIUM CHLORIDE 0.9 % IV SOLN: 500.0000 mL | INTRAVENOUS | Status: DC

## 2013-09-30 NOTE — Patient Instructions (Signed)
Normal colonoscopy today. For genetic testing, Dr.Kaplan's nurse will call you with appointment. Call if you have not heard from office in 1 week.  Resume current medications. Call us with any questions or concerns. Thank you!!  YOU HAD AN ENDOSCOPIC PROCEDURE TODAY AT [facility]: Refer to the procedure report that was given to you for any specific questions about what was found during the examination.  If the procedure report does not answer your questions, please call your gastroenterologist to clarify.  If you requested that your care partner not be given the details of your procedure findings, then the procedure report has been included in a sealed envelope for you to review at your convenience later.  YOU SHOULD EXPECT: Some feelings of bloating in the abdomen. Passage of more gas than usual.  Walking can help get rid of the air that was put into your GI tract during the procedure and reduce the bloating. If you had a lower endoscopy (such as a colonoscopy or flexible sigmoidoscopy) you may notice spotting of blood in your stool or on the toilet paper. If you underwent a bowel prep for your procedure, then you may not have a normal bowel movement for a few days.  DIET: Your first meal following the procedure should be a light meal and then it is ok to progress to your normal diet.  A half-sandwich or bowl of soup is an example of a good first meal.  Heavy or fried foods are harder to digest and may make you feel nauseous or bloated.  Likewise meals heavy in dairy and vegetables can cause extra gas to form and this can also increase the bloating.  Drink plenty of fluids but you should avoid alcoholic beverages for 24 hours.  ACTIVITY: Your care partner should take you home directly after the procedure.  You should plan to take it easy, moving slowly for the rest of the day.  You can resume normal activity the day after the procedure however you should NOT DRIVE or use heavy machinery  for 24 hours (because of the sedation medicines used during the test).    SYMPTOMS TO REPORT IMMEDIATELY: A gastroenterologist can be reached at any hour.  During normal business hours, 8:30 AM to 5:00 PM Monday through Friday, call [phone].  After hours and on weekends, please call the GI answering service at [phone] who will take a message and have the physician on call contact you.   Following lower endoscopy (colonoscopy or flexible sigmoidoscopy):  Excessive amounts of blood in the stool  Significant tenderness or worsening of abdominal pains  Swelling of the abdomen that is new, acute  Fever of 100F or higher  Following upper endoscopy (EGD)  Vomiting of blood or coffee ground material  New chest pain or pain under the shoulder blades  Painful or persistently difficult swallowing  New shortness of breath  Fever of 100F or higher  Black, tarry-looking stools  FOLLOW UP: If any biopsies were taken you will be contacted by phone or by letter within the next 1-3 weeks.  Call your gastroenterologist if you have not heard about the biopsies in 3 weeks.  Our staff will call the home number listed on your records the next business day following your procedure to check on you and address any questions or concerns that you may have at that time regarding the information given to you following your procedure. This is a courtesy call and so if there is no  answer at the home number and we have not heard from you through the emergency physician on call, we will assume that you have returned to your regular daily activities without incident.  SIGNATURES/CONFIDENTIALITY: You and/or your care partner have signed paperwork which will be entered into your electronic medical record.  These signatures attest to the fact that that the information above on your After Visit Summary has been reviewed and is understood.  Full responsibility of the confidentiality of this discharge information  lies with you and/or your care-partner.

## 2013-09-30 NOTE — Op Note (Signed)
[facility]  [address] [place], [id number]   COLONOSCOPY PROCEDURE REPORT  PATIENT: Philip Hernandez, Philip Hernandez  MR#: [id number] BIRTHDATE: 1956-03-25 , 89  yrs. old GENDER: Male ENDOSCOPIST: Inda Castle, MD REFERRED GY:[id number] Jacelyn Grip, M.D. PROCEDURE DATE:  09/30/2013 PROCEDURE:   Colonoscopy, diagnostic First Screening Colonoscopy - Avg.  risk and is 50 yrs.  old or older - No.  Prior Negative Screening - Now for repeat screening. N/A  History of Adenoma - Now for follow-up colonoscopy & has been > or = to 3 yrs.  Yes hx of adenoma.  Has been 3 or more years since last colonoscopy.  Polyps Removed Today? No.  Recommend repeat exam, <10 yrs? Yes.  High risk (family or personal hx). ASA CLASS:   Class II INDICATIONS:Patient's personal history of adenomatous colon polyps and Patient's immediate family history of colon cancer. family history of Lynch syndrome, brother MEDICATIONS: MAC sedation, administered by CRNA and Propofol (Diprivan) 300 mg IV  DESCRIPTION OF PROCEDURE:   After the risks benefits and alternatives of the procedure were thoroughly explained, informed consent was obtained.  A digital rectal exam revealed no abnormalities of the rectum.   The [id number]  endoscope was introduced through the anus and advanced to the cecum, which was identified by both the appendix and ileocecal valve. No adverse events experienced.   The quality of the prep was excellent using Suprep  The instrument was then slowly withdrawn as the colon was fully examined.      COLON FINDINGS: A normal appearing cecum, ileocecal valve, and appendiceal orifice were identified.  The ascending, hepatic flexure, transverse, splenic flexure, descending, sigmoid colon and rectum appeared unremarkable.  No polyps or cancers were seen. Retroflexed views revealed no abnormalities. The time to cecum=1 minutes 13 seconds.  Withdrawal time=8 minutes 11 seconds.  The scope was  withdrawn and the procedure completed. COMPLICATIONS: There were no complications.  ENDOSCOPIC IMPRESSION: Normal colon  RECOMMENDATIONS: 1.  referral for genetic testing.  If positive for Lynch syndrome repeat colonoscopy in 2 years, otherwise 5 years    eSigned:  Inda Castle, MD 09/30/2013 9:52 AM   cc:

## 2013-09-30 NOTE — Progress Notes (Signed)
Report to pacu rn, vss, bbs=clear 

## 2013-10-01 ENCOUNTER — Telehealth: Payer: Self-pay

## 2013-10-01 ENCOUNTER — Other Ambulatory Visit: Payer: Self-pay

## 2013-10-01 DIAGNOSIS — Z1379 Encounter for other screening for genetic and chromosomal anomalies: Secondary | ICD-10-CM

## 2013-10-01 NOTE — Telephone Encounter (Signed)
  Follow up Call-  Call back number 09/30/2013  Post procedure Call Back phone  # [phone]  Permission to leave phone message Yes     Patient questions:  Do you have a fever, pain , or abdominal swelling? no Pain Score  0 *  Have you tolerated food without any problems? yes  Have you been able to return to your normal activities? yes  Do you have any questions about your discharge instructions: Diet   no Medications  no Follow up visit  no  Do you have questions or concerns about your Care? no  Actions: * If pain score is 4 or above: No action needed, pain <4.  No problems per the pt. [name]

## 2013-10-02 ENCOUNTER — Other Ambulatory Visit: Payer: Self-pay

## 2013-10-07 ENCOUNTER — Telehealth: Payer: Self-pay | Admitting: Genetic Counselor

## 2013-10-07 NOTE — Telephone Encounter (Signed)
PT CALLED TO SCHEDULED GENETIC APPT 03/23 @ 10 W/KAREN POWELL WELCOME PACKET MAILED.

## 2013-11-20 ENCOUNTER — Other Ambulatory Visit (INDEPENDENT_AMBULATORY_CARE_PROVIDER_SITE_OTHER): Payer: BC Managed Care – PPO

## 2013-11-20 DIAGNOSIS — E785 Hyperlipidemia, unspecified: Secondary | ICD-10-CM

## 2013-11-20 DIAGNOSIS — E790 Hyperuricemia without signs of inflammatory arthritis and tophaceous disease: Secondary | ICD-10-CM

## 2013-11-20 NOTE — Progress Notes (Signed)
Pt came in for labs only 

## 2013-11-21 LAB — CMP14+EGFR
ALT: 13 IU/L (ref 0–44)
AST: 20 IU/L (ref 0–40)
Albumin/Globulin Ratio: 1.9 (ref 1.1–2.5)
Albumin: 4.3 g/dL (ref 3.5–5.5)
Alkaline Phosphatase: 75 IU/L (ref 39–117)
BUN/Creatinine Ratio: 13 (ref 9–20)
BUN: 14 mg/dL (ref 6–24)
CO2: 28 mmol/L (ref 18–29)
Calcium: 9.8 mg/dL (ref 8.7–10.2)
Chloride: 101 mmol/L (ref 97–108)
Creatinine, Ser: 1.08 mg/dL (ref 0.76–1.27)
GFR calc Af Amer: 88 mL/min/{1.73_m2} (ref >59)
GFR calc non Af Amer: 76 mL/min/{1.73_m2} (ref >59)
Globulin, Total: 2.3 g/dL (ref 1.5–4.5)
Glucose: 79 mg/dL (ref 65–99)
Potassium: 4.5 mmol/L (ref 3.5–5.2)
Sodium: 142 mmol/L (ref 134–144)
Total Bilirubin: 1.3 mg/dL — ABNORMAL HIGH (ref 0.0–1.2)
Total Protein: 6.6 g/dL (ref 6.0–8.5)

## 2013-11-21 LAB — URIC ACID: Uric Acid: 6.1 mg/dL (ref 3.7–8.6)

## 2013-11-21 LAB — NMR, LIPOPROFILE
Cholesterol: 144 mg/dL (ref <200)
HDL Cholesterol by NMR: 34 mg/dL — ABNORMAL LOW (ref >=40)
HDL Particle Number: 24.4 umol/L — ABNORMAL LOW (ref >=30.5)
LDL Particle Number: 1093 nmol/L — ABNORMAL HIGH (ref <1000)
LDL Size: 20.3 nm — ABNORMAL LOW (ref >20.5)
LDLC SERPL CALC-MCNC: 88 mg/dL (ref <100)
LP-IR Score: 60 — ABNORMAL HIGH (ref <=45)
Small LDL Particle Number: 596 nmol/L — ABNORMAL HIGH (ref <=527)
Triglycerides by NMR: 110 mg/dL (ref <150)

## 2013-11-24 ENCOUNTER — Ambulatory Visit: Payer: BC Managed Care – PPO | Admitting: Nurse Practitioner

## 2013-11-27 ENCOUNTER — Ambulatory Visit (INDEPENDENT_AMBULATORY_CARE_PROVIDER_SITE_OTHER): Payer: BC Managed Care – PPO | Admitting: Family Medicine

## 2013-11-27 ENCOUNTER — Encounter: Payer: Self-pay | Admitting: Family Medicine

## 2013-11-27 VITALS — BP 115/64 | HR 58 | Temp 97.0°F | Ht 72.0 in | Wt 156.8 lb

## 2013-11-27 DIAGNOSIS — G589 Mononeuropathy, unspecified: Secondary | ICD-10-CM

## 2013-11-27 DIAGNOSIS — D869 Sarcoidosis, unspecified: Secondary | ICD-10-CM

## 2013-11-27 DIAGNOSIS — R7989 Other specified abnormal findings of blood chemistry: Secondary | ICD-10-CM

## 2013-11-27 DIAGNOSIS — G629 Polyneuropathy, unspecified: Secondary | ICD-10-CM

## 2013-11-27 DIAGNOSIS — K219 Gastro-esophageal reflux disease without esophagitis: Secondary | ICD-10-CM

## 2013-11-27 DIAGNOSIS — E785 Hyperlipidemia, unspecified: Secondary | ICD-10-CM

## 2013-11-27 DIAGNOSIS — E790 Hyperuricemia without signs of inflammatory arthritis and tophaceous disease: Secondary | ICD-10-CM

## 2013-11-27 DIAGNOSIS — C61 Malignant neoplasm of prostate: Secondary | ICD-10-CM

## 2013-11-27 NOTE — Progress Notes (Signed)
Patient ID: Philip Hernandez, male   DOB: 01/14/1956, 57 y.o.   MRN: [id number] SUBJECTIVE: CC: Chief Complaint  Patient presents with  . Follow-up    4 month follow up chronic problems    HPI: Here for follow up of his multiple medical problems. Had labs done. He has changed his  Diet. Feels well and lost weight. No complaints.  Patient is here for follow up of hyperlipidemia: denies Headache;denies Chest Pain;denies weakness;denies Shortness of Breath and orthopnea;denies Visual changes;denies palpitations;denies cough;denies pedal edema;denies symptoms of TIA or stroke;deniesClaudication symptoms. admits to Compliance with medications; denies Problems with medications.   Sarcoidosis is stable.   Past Medical History  Diagnosis Date  . Sarcoidosis dec 2011    stage 2 pulmonary, neck nodes, splenic granuloma  . GERD (gastroesophageal reflux disease) Jan 2012    Dr Kaplan. Bx proven.  Deve abd pain in Jan 2012 following high dose steroids for sarcoid. s/p EGD and improvement in symptoms following PPI  . Malignant neoplasm of prostate dec 2010    Dr Lester Borden. s/p surgery dec 2010, hx of rnormal PSA  since then  . Cough     chronic sinus issues, GERD with poor dietary control and Pulmonary Sarcoid.   . Swelling, mass, or lump in head and neck   . Other and unspecified mycoses   . Painful respiration   . Hypogonadism male   . Colon polyp   . Kidney stone   . Peripheral neuropathy    Past Surgical History  Procedure Laterality Date  . Shoulder surgery Right 2009, 2010    x 2  . Hernia repair  13 yrs ago  . Prostate surgery  2010  . Nasal sinus surgery  2013  . Lymph node biopsy  05/2010, 07/2010  . Nasal polyps  1984   History   Social History  . Marital Status: Married    Spouse Name: N/A    Number of Children: N/A  . Years of Education: N/A   Occupational History  . unemployed    Social History Main Topics  . Smoking status: Never Smoker   . Smokeless  tobacco: Never Used  . Alcohol Use: No  . Drug Use: No  . Sexual Activity: Not on file   Other Topics Concern  . Not on file   Social History Narrative   Married   2 children   Family History  Problem Relation Age of Onset  . Cancer Mother     colon  . Colon cancer Mother 45  . Cancer Brother     colon  . Colon cancer Brother 45  . Colon cancer Maternal Aunt 50  . Colon cancer Maternal Aunt 50   Current Outpatient Prescriptions on File Prior to Visit  Medication Sig Dispense Refill  . acetaminophen (TYLENOL) 500 MG tablet Take 500 mg by mouth every 6 (six) hours as needed.      . cetirizine (ZYRTEC) 10 MG tablet Take 10 mg by mouth daily.      . cholecalciferol (VITAMIN D) 1000 UNITS tablet Take 1,000 Units by mouth daily.        . fluticasone (FLONASE) 50 MCG/ACT nasal spray Place 2 sprays into the nose daily.  16 g  1  . gabapentin (NEURONTIN) 800 MG tablet Take 1 tablet (800 mg total) by mouth 4 (four) times daily.  360 tablet  1  . Multiple Vitamins-Minerals (AIRBORNE PO) Take by mouth daily.      . omeprazole (PRILOSEC)   40 MG capsule Take 1 capsule (40 mg total) by mouth daily.  30 capsule  5  . allopurinol (ZYLOPRIM) 100 MG tablet Take 100 mg by mouth.       . amitriptyline (ELAVIL) 25 MG tablet Take 1 tablet (25 mg total) by mouth at bedtime.  30 tablet  3  . b complex vitamins tablet Take 1 tablet by mouth daily.      . [DISCONTINUED] Sodium Chloride-Sodium Bicarb 1.57 G PACK Use as directed once every am       No current facility-administered medications on file prior to visit.   No Known Allergies Immunization History  Administered Date(s) Administered  . Influenza Whole 08/29/2010  . Influenza,inj,Quad PF,36+ Mos 07/24/2013   Prior to Admission medications   Medication Sig Start Date End Date Taking? Authorizing Provider  acetaminophen (TYLENOL) 500 MG tablet Take 500 mg by mouth every 6 (six) hours as needed.    Historical Provider, MD  allopurinol  (ZYLOPRIM) 100 MG tablet Take 100 mg by mouth.  07/03/13   Historical Provider, MD  amitriptyline (ELAVIL) 25 MG tablet Take 1 tablet (25 mg total) by mouth at bedtime. 02/25/13   Lynn E Lam, NP  b complex vitamins tablet Take 1 tablet by mouth daily.    Historical Provider, MD  cetirizine (ZYRTEC) 10 MG tablet Take 10 mg by mouth daily.    Historical Provider, MD  cholecalciferol (VITAMIN D) 1000 UNITS tablet Take 1,000 Units by mouth daily.      Historical Provider, MD  fluticasone (FLONASE) 50 MCG/ACT nasal spray Place 2 sprays into the nose daily. 12/08/12   Francis P Wong, MD  gabapentin (NEURONTIN) 800 MG tablet Take 1 tablet (800 mg total) by mouth 4 (four) times daily. 06/05/13   Pramod S Sethi, MD  Multiple Vitamins-Minerals (AIRBORNE PO) Take by mouth daily.    Historical Provider, MD  omeprazole (PRILOSEC) 40 MG capsule Take 1 capsule (40 mg total) by mouth daily. 07/24/13 07/24/14  Francis P Wong, MD     ROS: As above in the HPI. All other systems are stable or negative.  OBJECTIVE: APPEARANCE:  Patient in no acute distress.The patient appeared well nourished and normally developed. Acyanotic. Waist: VITAL SIGNS:BP 115/64  Pulse 58  Temp(Src) 97 F (36.1 C) (Oral)  Ht 6' (1.829 m)  Wt 156 lb 12.8 oz (71.124 kg)  BMI 21.26 kg/m2 WM  SKIN: warm and  Dry without overt rashes, tattoos and scars  HEAD and Neck: without JVD, Head and scalp: normal Eyes:No scleral icterus. Fundi normal, eye movements normal. Ears: Auricle normal, canal normal, Tympanic membranes normal, insufflation normal. Nose: normal Throat: normal Neck & thyroid: normal  CHEST & LUNGS: Chest wall: normal Lungs: Clear  CVS: Reveals the PMI to be normally located. Regular rhythm, First and Second Heart sounds are normal,  absence of murmurs, rubs or gallops. Peripheral vasculature: Radial pulses: normal Dorsal pedis pulses: normal Posterior pulses: normal  ABDOMEN:  Appearance: normal Benign,  no organomegaly, no masses, no Abdominal Aortic enlargement. No Guarding , no rebound. No Bruits. Bowel sounds: normal  RECTAL: N/A GU: N/A  EXTREMETIES: nonedematous.  MUSCULOSKELETAL:  Spine: normal Joints: intact  NEUROLOGIC: oriented to time,place and person; nonfocal.  ASSESSMENT: Sarcoidosis  HLD (hyperlipidemia) - Plan: CMP14+EGFR, NMR, lipoprofile  Neuropathy  Hyperuricemia - Plan: Uric acid  GERD  ADENOCARCINOMA, PROSTATE  PLAN: Copies of labs given to patient. No changes in diet and exercise.       Dr Francis Wong's   Recommendations  For nutrition information, I recommend books:  1).Eat to Live by Dr Excell Seltzer. 2).Prevent and Reverse Heart Disease by Dr Karl Luke. 3) Dr Janene Harvey Book:  Program to Reverse Diabetes  Exercise recommendations are:  If unable to walk, then the patient can exercise in a chair 3 times a day. By flapping arms like a bird gently and raising legs outwards to the front.  If ambulatory, the patient can go for walks for 30 minutes 3 times a week. Then increase the intensity and duration as tolerated.  Goal is to try to attain exercise frequency to 5 times a week.  If applicable: Best to perform resistance exercises (machines or weights) 2 days a week and cardio type exercises 3 days per week.   Handout on sarcoidosis Orders Placed This Encounter  Procedures  . CMP14+EGFR    Standing Status: Future     Number of Occurrences:      Standing Expiration Date: 11/28/2014    Order Specific Question:  Has the patient fasted?    Answer:  Yes  . NMR, lipoprofile    Standing Status: Future     Number of Occurrences:      Standing Expiration Date: 11/28/2014  . Uric acid    Standing Status: Future     Number of Occurrences:      Standing Expiration Date: 11/28/2014   No orders of the defined types were placed in this encounter.   There are no discontinued medications. Return in about 6 months (around 05/30/2014)  for Recheck medical problems.  Marise Knapper P. Jacelyn Grip, M.D.

## 2013-11-27 NOTE — Patient Instructions (Signed)
Sarcoidosis, Schaumann's Disease, Sarcoid of [place] Sarcoidosis appears briefly and heals naturally in 60 to 70 percent of cases, often without the patient knowing or doing anything about it. 20 to 30 percent of patients with sarcoidosis are left with some permanent lung damage. In 10 to 15 percent of the patients, sarcoidosis can become chronic (long lasting). When either the granulomas or fibrosis seriously affect the function of a vital organ (lungs, heart, nervous system, liver, or kidneys), sarcoidosis can be fatal. This occurs 5 to 10 percent of the time. No one can predict how sarcoidosis will progress in an individual patient. The symptoms the patient experiences, the caregiver's findings, and the patient's race can give some clues. Sarcoidosis was once considered a rare disease. We now know that it is a common chronic illness that appears all over the world. It is the most common of the fibrotic (scarring) lung disorders. Anyone can get sarcoidosis. It occurs in all races and in both sexes. The risk is greater if you are a young black adult, especially a black woman, or are of Scandinavian, German, Irish, or Puerto Rican origin. In sarcoidosis, small lumps (also called nodules or granulomas) develop in multiple organs of the body. These granulomas are small collections of inflamed cells. They commonly appear in the lungs. This is the most common organ affected. They also occur in the lymph nodes (your glands), skin, liver, and eyes. The granulomas vary in the amount of disease they produce from very little with no problems (symptoms) to causing severe illness. The cause of sarcoidosis is not known. It may be due to an abnormal immune reaction in the body. Most people will recover. A few people will develop long lasting conditions that may get worse. Women are affected more often than men. The majority of those affected are under forty years of age. Because we do not know the cause, we do not have ways to  prevent it. SYMPTOMS   Fever.  Loss of appetite.  Night sweats.  Joint pain.  Aching muscles Symptoms vary because the disease affects different parts of the body in different people. Most people who see their caregiver with sarcoidosis have lung problems. The first signs are usually a dry cough and shortness of breath. There may also be wheezing, chest pain, or a cough that brings up bloody mucus. In severe cases, lung function may become so poor that the person cannot perform even the simple routine tasks of daily life. Other symptoms of sarcoidosis are less common than lung symptoms. They can include:  Skin symptoms. Sarcoidosis can appear as a collection of tender, red bumps called erythema nodosum. These bumps usually occur on the face, shins, and arms. They can also occur as a scaly, purplish discoloration on the nose, cheeks, and ears. This is called lupus pernio. Less often, sarcoidosis causes cysts, pimples, or disfiguring over growths of skin. In many cases, the disfiguring over growths develop in areas of scars or tattoos.  Eye symptoms. These include redness, eye pain, and sensitivity to light.  Heart symptoms. These include irregular heartbeat and heart failure.  Other symptoms. A person may have paralyzed facial muscles, seizures, psychiatric symptoms, swollen salivary glands, or bone pain. DIAGNOSIS  Even when there are no symptoms, your caregiver can sometimes pick up signs of sarcoidosis during a routine examination, usually through a chest x-ray or when checking other complaints. The patient's age and race or ethnic group can raise an additional red flag that a sign or symptom could   be related to sarcoidosis.   Enlargement of the salivary or tear glands and cysts in bone tissue may also be caused by sarcoidosis.  You may have had a biopsy done that shows signs of sarcoidosis. A biopsy is a small tissue sample that is removed for laboratory testing. This tissue sample can  be taken from your lung, skin, lip, or another inflamed or abnormal area of the body.  You may have had an abnormal chest X-ray. Although you appear healthy, a chest X-ray ordered for other reasons may turn up abnormalities that suggest sarcoidosis.  Other tests may be needed. These tests may be done to rule out other illnesses or to determine the amount of organ damage caused by sarcoidosis. Some of the most common tests are:  Blood levels of calcium or angiotensin-converting enzyme may be high in people with sarcoidosis.  Blood tests to evaluate how well your liver is functioning.  Lung function tests to measure how well you are breathing.  A complete eye examination. TREATMENT  If sarcoidosis does not cause any problems, treatment may not be necessary. Your caregiver may decide to simply monitor your condition. As part of this monitoring process, you may have frequent office visits, follow-up chest X-rays, and tests of your lung function.If you have signs of moderate or severe lung disease, your doctor may recommend:  A corticosteroid drug, such as prednisone (sold under several brand names).  Corticosteroids also are used to treat sarcoidosis of the eyes, joints, skin, nerves, or heart.  Corticosteroid eye drops may be used for the eyes.  Over-the-counter medications like nonsteroidal anti-inflammatory drugs (NSAID) often are used to treat joint pain first before corticosteroids, which tend to have more side effects.  If corticosteroids are not effective or cause serious side effects, other drugs that alter or suppress the immune system may be used.  In rare cases, when sarcoidosis causes life-threatening lung disease, a lung transplant may be necessary. However, there is some risk that the new lungs also will be attacked by sarcoidosis. SEEK IMMEDIATE MEDICAL CARE IF:   You suffer from shortness of breath or a lingering cough.  You develop new problems that may be related to the  disease. Remember this disease can affect almost all organs of the body and cause many different problems. Document Released: 07/04/2004 Document Revised: 11/26/2011 Document Reviewed: 12/12/2005 [facility] Patient Information 2014 [employer].        Dr Paula Libra Recommendations  For nutrition information, I recommend books:  1).Eat to Live by Dr Excell Seltzer. 2).Prevent and Reverse Heart Disease by Dr Karl Luke. 3) Dr Janene Harvey Book:  Program to Reverse Diabetes  Exercise recommendations are:  If unable to walk, then the patient can exercise in a chair 3 times a day. By flapping arms like a bird gently and raising legs outwards to the front.  If ambulatory, the patient can go for walks for 30 minutes 3 times a week. Then increase the intensity and duration as tolerated.  Goal is to try to attain exercise frequency to 5 times a week.  If applicable: Best to perform resistance exercises (machines or weights) 2 days a week and cardio type exercises 3 days per week.

## 2013-12-07 ENCOUNTER — Ambulatory Visit (HOSPITAL_BASED_OUTPATIENT_CLINIC_OR_DEPARTMENT_OTHER): Payer: BC Managed Care – PPO | Admitting: Genetic Counselor

## 2013-12-07 ENCOUNTER — Other Ambulatory Visit: Payer: BC Managed Care – PPO

## 2013-12-07 ENCOUNTER — Encounter: Payer: Self-pay | Admitting: Genetic Counselor

## 2013-12-07 DIAGNOSIS — Z800 Family history of malignant neoplasm of digestive organs: Secondary | ICD-10-CM

## 2013-12-07 DIAGNOSIS — IMO0002 Reserved for concepts with insufficient information to code with codable children: Secondary | ICD-10-CM

## 2013-12-07 DIAGNOSIS — K635 Polyp of colon: Secondary | ICD-10-CM | POA: Insufficient documentation

## 2013-12-07 DIAGNOSIS — Z8042 Family history of malignant neoplasm of prostate: Secondary | ICD-10-CM

## 2013-12-07 DIAGNOSIS — Z860100 Personal history of colon polyps, unspecified: Secondary | ICD-10-CM

## 2013-12-07 DIAGNOSIS — C61 Malignant neoplasm of prostate: Secondary | ICD-10-CM

## 2013-12-07 DIAGNOSIS — Z85038 Personal history of other malignant neoplasm of large intestine: Secondary | ICD-10-CM

## 2013-12-07 DIAGNOSIS — Z803 Family history of malignant neoplasm of breast: Secondary | ICD-10-CM

## 2013-12-07 NOTE — Progress Notes (Signed)
Dr.  Erskine Emery requested a consultation for genetic counseling and risk assessment for Philip Hernandez, a 58 y.o. male, for discussion of his personal history of prostate cancer and colon polyps and family history of colon, breast, stomach, prostate and "male" cancers.  He presents to clinic today to discuss the possibility of a genetic predisposition to cancer, and to further clarify his risks, as well as his family members' risks for cancer.   HISTORY OF PRESENT ILLNESS: In 2010, at the age of 13, Philip Hernandez was diagnosed with prostate cancer. This was treated with the removal of the prostate. His Gleason score was 6.  In 2015 he had a colonoscopy which found one colon polyp.  His brother, who had colon cancer at age 74, was told that he has Lynch syndrome based on his personal and family history of colon cancer, but was reportedly never tested.    Past Medical History  Diagnosis Date  . Sarcoidosis dec 2011    stage 2 pulmonary, neck nodes, splenic granuloma  . GERD (gastroesophageal reflux disease) Jan 2012    [name]. Bx proven.  Deve abd pain in Jan 2012 following high dose steroids for sarcoid. s/p EGD and improvement in symptoms following PPI  . Malignant neoplasm of prostate dec 2010    [name]. s/p surgery dec 2010, hx of rnormal PSA  since then  . Cough     chronic sinus issues, GERD with poor dietary control and Pulmonary Sarcoid.   Marland Kitchen Swelling, mass, or lump in head and neck   . Other and unspecified mycoses   . Painful respiration   . Hypogonadism male   . Colon polyp   . Kidney stone   . Peripheral neuropathy   . Colon polyps 2015    Past Surgical History  Procedure Laterality Date  . Shoulder surgery Right 2009, 2010    x 2  . Hernia repair  13 yrs ago  . Prostate surgery  2010  . Nasal sinus surgery  2013  . Lymph node biopsy  05/2010, 07/2010  . Nasal polyps  1984    History   Social History  . Marital Status: Married    Spouse Name:  N/A    Number of Children: 2  . Years of Education: N/A   Occupational History  . unemployed    Social History Main Topics  . Smoking status: Never Smoker   . Smokeless tobacco: Never Used  . Alcohol Use: No  . Drug Use: No  . Sexual Activity: Not on file   Other Topics Concern  . Not on file   Social History Narrative   Married   2 children    FAMILY HISTORY:  We obtained a detailed, 4-generation family history.  Significant diagnoses are listed below: Family History  Problem Relation Age of Onset  . Colon cancer Mother 47  . Skin cancer Mother   . Colon cancer Brother 68  . Skin cancer Brother   . Colon cancer Maternal Aunt     dx in her 31s  . Cancer Maternal Aunt     "cancer of male organs" dx in her 18s  . Breast cancer Maternal Aunt 66    bilateral, dx again at 59  . Colon cancer Maternal Aunt 70  . Stomach cancer Maternal Aunt 68  . Stomach cancer Maternal Uncle 71  . Skin cancer Paternal Aunt   . Prostate cancer Paternal Uncle 69  . Stomach cancer Paternal  Grandmother     dx in her 74s  . Stroke Paternal Grandfather   . Colon cancer Maternal Aunt 68  . Prostate cancer Paternal Uncle   . Prostate cancer Paternal Uncle   . Kidney cancer Paternal Uncle   . Lung cancer Paternal Uncle   . Lung cancer Paternal Uncle   . Cancer Cousin     paternal cousin with cancer NOS  . Cancer Cousin     paternal cousin with cancer NOS   Patient's ancestors are of Caucasian descent. There is no reported Ashkenazi Jewish ancestry. There is no known consanguinity.  GENETIC COUNSELING ASSESSMENT: Philip Hernandez is a 58 y.o. male with a personal history of prostate cancer and colon polyps and family history of colon, breast, stomach, prostate and "male organ" cancer which somewhat suggestive of a Lynch syndrome and predisposition to cancer. We, therefore, discussed and recommended the following at today's visit.   DISCUSSION: We reviewed the characteristics, features and  inheritance patterns of hereditary cancer syndromes. We also discussed genetic testing, including the appropriate family members to test, the process of testing, insurance coverage and turn-around-time for results. We discussed that, while his family history is representative of Lynch syndrome, and that he meets the [place] criteria for Lynch syndrome, the best person to test is his brother.  He does not feel that his brother would undergo genetic testing, and he is worried about his children.  Based on the cancers within the family we will pursue teh comprehensive cancer panel.  PLAN: After considering the risks, benefits, and limitations, Philip Hernandez provided informed consent to pursue genetic testing and the blood sample will be sent to [facility] for analysis of the Comprehensive Cancer Panel. We discussed the implications of a positive, negative and/ or variant of uncertain significance genetic test result. Results should be available within approximately 3 weeks' time, at which point they will be disclosed by telephone to Philip Hernandez, as will any additional recommendations warranted by these results. Philip Hernandez will receive a summary of his genetic counseling visit and a copy of his results once available. This information will also be available in Epic. We encouraged Philip Hernandez to remain in contact with cancer genetics annually so that we can continuously update the family history and inform him of any changes in cancer genetics and testing that may be of benefit for his family. Philip Hernandez's questions were answered to his satisfaction today. Our contact information was provided should additional questions or concerns arise.  The patient was seen for a total of 60 minutes, greater than 50% of which was spent face-to-face counseling.  This note will also be sent to the referring provider via the electronic medical record. The patient will be supplied with a summary of this  genetic counseling discussion as well as educational information on the discussed hereditary cancer syndromes following the conclusion of their visit.   Patient was discussed with Dr. Marcy Panning.   _______________________________________________________________________ For Office Staff:  Number of people involved in session: 1 Was an Intern/ student involved with case: yes

## 2013-12-10 ENCOUNTER — Ambulatory Visit (INDEPENDENT_AMBULATORY_CARE_PROVIDER_SITE_OTHER): Payer: BC Managed Care – PPO | Admitting: Nurse Practitioner

## 2013-12-10 ENCOUNTER — Encounter: Payer: Self-pay | Admitting: Nurse Practitioner

## 2013-12-10 VITALS — BP 118/70 | HR 68 | Ht 72.0 in | Wt 156.0 lb

## 2013-12-10 DIAGNOSIS — G629 Polyneuropathy, unspecified: Secondary | ICD-10-CM

## 2013-12-10 DIAGNOSIS — G589 Mononeuropathy, unspecified: Secondary | ICD-10-CM

## 2013-12-10 MED ORDER — GABAPENTIN 800 MG PO TABS: 800.0000 mg | ORAL_TABLET | Freq: Four times a day (QID) | ORAL | Status: DC

## 2013-12-10 NOTE — Progress Notes (Signed)
PATIENT: Philip Hernandez DOB: Jan 26, 1956  REASON FOR VISIT: follow up for neuropathy HISTORY FROM: patient  HISTORY OF PRESENT ILLNESS: 58 year old Caucasian male with history of bilateral hands and feet paresthesias and weakness since March 2013 likely from peripheral neuropathy returns for followup. Numbness continues, no relief. No treatable cause identified. Neuropathy may be likely to his underlying sarcoidosis. He has been decreased with his prednisone.   Update 11/19/2012: He returns for followup after last visit on 07/14/2012. He states his paresthesias and tingling are unchanged and about the same. He states that gabapentin does help him but the effect lasted only about 5-6 hours. He is tolerating it well without any significant side effects. He states his sarcoidosis is stable and he is considering seeing his pulmonologist to discuss tapering and stopping his prednisone. He is complaining of left hip localized pain which he feels started after the EMG study. His pain is intermittent and at times sharp. Tylenol relieves the pain. He also complains of decreased stamina and gets higher easily and feels he will be unable to go back to work because of his pain and lack of stamina. He plans to apply for disability.   Update 02/25/2013: He returns for followup since his last visit on 11/19/2012. He states his paresthesias and tingling are unchanged and about the same. He also complains of shooting pain in his toes. His gabapentin was increased at last visit and he states it does help him. He complains of increased muscle weakness in bilateral legs. He states that his balance is a problem but he has not fallen. He knows he has to walk slowly and steady and not make any sudden movements. Sometimes when he is down on the ground it difficult for him to get up by himself. He has applied for disability at the beginning of May. His prednisone was completely tapered in February with good results.    UPDATE 05/27/13: Philip Hernandez returns for 3 month follow up visit for neuropathy. He feels like his symptoms are stable and have not progressed. He tried Elavil after last visit sporadically, and taking a whole tablet instead of 1/2 tablet as recommended and said it made him very lethargic the next day after he took it. Three days ago he started taking 1/2 tablet each night and is tolerating much better so far. He thinks he is regaining some muscle strength back in his legs. He states that his feet are very painful the next day after if he walks a lot in a day. He is tolerating the Gabapentin well. He complains of hip pain. Xray showed bilateral degenerative changes.   UPDATE 12/10/13 (LL):  Philip Hernandez returns for follow up.  His symptoms are unchanged.  His pain is tolerable with Gabapentin.  He was granted disability after 5 months.   REVIEW OF SYSTEMS: Full 14 system review of systems performed and notable only for fatigue, light sensitivity, light sensitivity, short of breath, hearing loss, frequent waking, daytime sleepiness, back pain, aching muscles, muscle cramps, walking difficulty, weakness, numbness  ALLERGIES: No Known Allergies  HOME MEDICATIONS: Outpatient Prescriptions Prior to Visit  Medication Sig Dispense Refill  . acetaminophen (TYLENOL) 500 MG tablet Take 500 mg by mouth every 6 (six) hours as needed.      . cetirizine (ZYRTEC) 10 MG tablet Take 10 mg by mouth daily.      . cholecalciferol (VITAMIN D) 1000 UNITS tablet Take 1,000 Units by mouth daily.        Marland Kitchen  fluticasone (FLONASE) 50 MCG/ACT nasal spray Place 2 sprays into the nose daily.  16 g  1  . Multiple Vitamins-Minerals (AIRBORNE PO) Take by mouth daily.      Marland Kitchen omeprazole (PRILOSEC) 40 MG capsule Take 1 capsule (40 mg total) by mouth daily.  30 capsule  5  . allopurinol (ZYLOPRIM) 100 MG tablet Take 100 mg by mouth.       Marland Kitchen amitriptyline (ELAVIL) 25 MG tablet Take 1 tablet (25 mg total) by mouth at bedtime.  30 tablet   3  . b complex vitamins tablet Take 1 tablet by mouth daily.      Marland Kitchen gabapentin (NEURONTIN) 800 MG tablet Take 1 tablet (800 mg total) by mouth 4 (four) times daily.  360 tablet  1   No facility-administered medications prior to visit.     PHYSICAL EXAM  Filed Vitals:   12/10/13 1515  BP: 118/70  Pulse: 68  Height: 6' (1.829 m)  Weight: 156 lb (70.761 kg)   Body mass index is 21.15 kg/(m^2).  GENERAL EXAM:  Pleasant middle aged Caucasian male, in no distress. Afebrile.  Head: Nontraumatic.  Ears, Nose and throat: Hearing is normal.  Neck: Supple without bruit.  Respiratory: CTA  Musculoskeletal: no deformity. Tenderness left hip lateral aspect. No visible erythema.  Skin: no rash. Old surgical scar on neck from lymph node removal.   CARDIOVASCULAR:  Regular rate and rhythm, no murmurs, no carotid bruits  NEUROLOGIC:  MENTAL STATUS: awake, alert, language fluent, comprehension intact, naming intact  CRANIAL NERVE: no papilledema on fundoscopic exam, pupils equal and reactive to light, visual fields full to confrontation, extraocular muscles intact, no nystagmus, facial sensation and strength symmetric, uvula midline, shoulder shrug symmetric, tongue midline.  MOTOR: normal bulk and tone, full strength in the BUE, BLE. Reveals no upper or lower extremity drift. Mild weakness in bilateral ankle dorsiflexors, right more than left  SENSORY: Touch and pinprick sensations are diminished at toes bilaterally and to ankle on right and over fingertips bilaterally. Impaired vibration sense of his toes and fingertips bilaterally. Position sense is preserved.  COORDINATION: finger-nose-finger, fine finger movements normal  REFLEXES: deep tendon reflexes present and symmetric  GAIT/STATION: Slow cautious steady gait. Able to do tandem walk with mild difficulty. Able to stand on toes and heels with moderate difficulty. romberg is negative  01/08/13 NCV/EMG  Nerve conduction studies done on  both lower extremities and the left upper extremity shows evidence of primarily motor involvement of the nerves in the legs, normal on the left arm. EMG evaluation of the left lower extremity shows mild primarily distal acute and chronic denervation, consistent with a peripheral neuropathy. There is no evidence of nerve root involvement. EMG evaluation shows evidence of a mononeuropathy affecting the median nerve proximally, again without evidence of a cervical radiculopathy. In the appropriate clinical setting, the study could be consistent with Guillian-Barre syndrome, but some chronic features of denervation also seen. Sarcoidosis may be another consideration, but no sensory involvement is noted.   ASSESSMENT AND PLAN  58 year old Caucasian right-handed male with history of bilateral hands and feet paresthesias and weakness since March 2013 likely from peripheral neuropathy returns for followup. Numbness continues, with moderate relief with gabapentin. No treatable cause is identified. Neuropathy may likely be related to his underlying sarcoidosis. His prednisone was stopped in February 2014.   Plan:  Continue gabapentin 800 mg 4 times a day.  Return for followup in 6 months, sooner as needed.  Meds ordered this  encounter  Medications  . gabapentin (NEURONTIN) 800 MG tablet    Sig: Take 1 tablet (800 mg total) by mouth 4 (four) times daily.    Dispense:  360 tablet    Refill:  2    Order Specific Question:  Supervising Provider    Answer:  Garvin Fila [[id number]]   Return in about 6 months (around 06/12/2014).  Philmore Pali, MSN, NP-C 12/10/2013, 8:01 PM [facility] [address], [place], [place] [address] [phone]  Note: This document was prepared with digital dictation and possible smart phrase technology. Any transcriptional errors that result from this process are unintentional.

## 2013-12-10 NOTE — Patient Instructions (Addendum)
Plan:  Continue gabapentin 800 mg 4 times a day.  Return for followup in 6 months, sooner as needed.  Peripheral Neuropathy Peripheral neuropathy is a type of nerve damage. It affects nerves that carry signals between the spinal cord and other parts of the body. These are called peripheral nerves. With peripheral neuropathy, one nerve or a group of nerves may be damaged.  CAUSES  Many things can damage peripheral nerves. For some people with peripheral neuropathy, the cause is unknown. Some causes include:  Diabetes. This is the most common cause of peripheral neuropathy.  Injury to a nerve.  Pressure or stress on a nerve that lasts a long time.  Too little vitamin B. Alcoholism can lead to this.  Infections.  Autoimmune diseases, such as multiple sclerosis and systemic lupus erythematosus.  Inherited nerve diseases.  Some medicines, such as cancer drugs.  Toxic substances, such as lead and mercury.  Too little blood flowing to the legs.  Kidney disease.  Thyroid disease. SIGNS AND SYMPTOMS  Different people have different symptoms. The symptoms you have will depend on which of your nerves is damaged. Common symptoms include:  Loss of feeling (numbness) in the feet and hands.  Tingling in the feet and hands.  Pain that burns.  Very sensitive skin.  Weakness.  Not being able to move a part of the body (paralysis).  Muscle twitching.  Clumsiness or poor coordination.  Loss of balance.  Not being able to control your bladder.  Feeling dizzy.  Sexual problems. DIAGNOSIS  Peripheral neuropathy is a symptom, not a disease. Finding the cause of peripheral neuropathy can be hard. To figure that out, your health care provider will take a medical history and do a physical exam. A neurological exam will also be done. This involves checking things affected by your brain, spinal cord, and nerves (nervous system). For example, your health care provider will check your  reflexes, how you move, and what you can feel.  Other types of tests may also be ordered, such as:  Blood tests.  A test of the fluid in your spinal cord.  Imaging tests, such as CT scans or an MRI.  Electromyography (EMG). This test checks the nerves that control muscles.  Nerve conduction velocity tests. These tests check how fast messages pass through your nerves.  Nerve biopsy. A small piece of nerve is removed. It is then checked under a microscope. TREATMENT   Medicine is often used to treat peripheral neuropathy. Medicines may include:  Pain-relieving medicines. Prescription or over-the-counter medicine may be suggested.  Antiseizure medicine. This may be used for pain.  Antidepressants. These also may help ease pain from neuropathy.  Lidocaine. This is a numbing medicine. You might wear a patch or be given a shot.  Mexiletine. This medicine is typically used to help control irregular heart rhythms.  Surgery. Surgery may be needed to relieve pressure on a nerve or to destroy a nerve that is causing pain.  Physical therapy to help movement.  Assistive devices to help movement. HOME CARE INSTRUCTIONS   Only take over-the-counter or prescription medicines as directed by your health care provider. Follow the instructions carefully for any given medicines. Do not take any other medicines without first getting approval from your health care provider.  If you have diabetes, work closely with your health care provider to keep your blood sugar under control.  If you have numbness in your feet:  Check every day for signs of injury or infection. Watch  for redness, warmth, and swelling.  Wear padded socks and comfortable shoes. These help protect your feet.  Do not do things that put pressure on your damaged nerve.  Do not smoke. Smoking keeps blood from getting to damaged nerves.  Avoid or limit alcohol. Too much alcohol can cause a lack of B vitamins. These vitamins are  needed for healthy nerves.  Develop a good support system. Coping with peripheral neuropathy can be stressful. Talk to a mental health specialist or join a support group if you are struggling.  Follow up with your health care provider as directed. SEEK MEDICAL CARE IF:   You have new signs or symptoms of peripheral neuropathy.  You are struggling emotionally from dealing with peripheral neuropathy.  You have a fever. SEEK IMMEDIATE MEDICAL CARE IF:   You have an injury or infection that is not healing.  You feel very dizzy or begin vomiting.  You have chest pain.  You have trouble breathing. Document Released: 08/24/2002 Document Revised: 05/16/2011 Document Reviewed: 05/11/2013 [facility] Patient Information 2014 [place].

## 2013-12-16 ENCOUNTER — Other Ambulatory Visit: Payer: Self-pay | Admitting: Neurology

## 2013-12-21 ENCOUNTER — Telehealth: Payer: Self-pay | Admitting: *Deleted

## 2013-12-21 NOTE — Telephone Encounter (Signed)
Pt called in question of genetic results.  Informed pt that his testing will not be back until the week of 12/28/13.  Informed pt that one of the genetic counselors will call him with his result.  Pt denies further needs.

## 2013-12-23 ENCOUNTER — Encounter: Payer: Self-pay | Admitting: Genetic Counselor

## 2013-12-23 DIAGNOSIS — K635 Polyp of colon: Secondary | ICD-10-CM

## 2013-12-23 DIAGNOSIS — Z800 Family history of malignant neoplasm of digestive organs: Secondary | ICD-10-CM

## 2013-12-23 NOTE — Progress Notes (Signed)
HPI:  Mr. Agner was previously seen in the [facility] clinic due to a family of cancer and concerns regarding a hereditary predisposition to cancer. Please refer to our prior cancer genetics clinic note for more information regarding Mr. Bazen medical, social and family histories, and our assessment and recommendations, at the time. Mr. Goetting recent genetic test results were disclosed to her, as were recommendations warranted by these results. These results and recommendations are discussed in more detail below.  GENETIC TEST RESULTS: At the time of Mr. Stohr visit, we recommended he pursue genetic testing of the Comprehensive Cancer gene panel. This test, which included sequencing and deletion/duplication analysis of the genes listed on the test report, was performed at [facility]. Genetic testing was normal, and did not reveal a mutation in these genes. A complete list of all genes tested is located on the test report scanned into EPIC.    We discussed with Mr. Sigal that since the current genetic testing is not perfect, it is possible there may be a gene mutation in one of these genes that current testing cannot detect, but that chance is small.  We also discussed, that it is possible that another gene that has not yet been discovered, or that we have not yet tested, is responsible for the cancer diagnoses in the family, and it is, therefore, important to remain in touch with cancer genetics in the future so that we can continue to offer Mr. Davenport the most up to date genetic testing.   CANCER SCREENING RECOMMENDATIONS: This normal result is generally reassuring and indicates that Mr. Fofana does not likely have an increased risk of cancer due to a a mutation in one of these genes.  We, therefore, recommended  Mr. Hinz continue to closely follow the cancer screening guidelines provided by his gastroenterologist and his primary healthcare providers.   RECOMMENDATIONS FOR  FAMILY MEMBERS:  While these results are reassuring for Mr. Toto, this test does not tell us anything about Mr. Emond relatives' risks. It is possible there is a hereditary cancer syndrome in this family that Mr. Rawl did not inherit. We, therefore, recommended his relatives also have genetic counseling and testing. Specifically, we recommended further genetic testing in Mr. Searls family as such testing might help [place] be even more confident in our interpretation of  Mr. Minniefield own negative results. Genetic testing is best initiated in someone who has had cancer.  In this family, testing a relative with colon cancer would be the most informative person to test. We are happy to help facilitate testing, or genetic counselors can be located in other cities, by visiting the website of the [employer] ([employer]ist[employer].se) and Field seismologist for a Dietitian by zip code.  FOLLOW-UP: Lastly, we discussed with Mr. Dais that cancer genetics is a rapidly advancing field and it is possible that new genetic tests will be appropriate for him and/or her family members in the future. We encouraged him to remain in contact with cancer genetics on an annual basis so we can update his personal and family histories and let him know of advances in cancer genetics that may benefit this family.   Our contact number was provided. Mr. Stamper questions were answered to his satisfaction, and he knows he is welcome to call us at anytime with additional questions or concerns. This patient was discussed with Dr. Deatra Ina who agrees with the above.   Catherine A. Fine, MS, CGC Certified Genetic Counseor phone:  [phone] [email or web address]

## 2014-01-05 ENCOUNTER — Telehealth: Payer: Self-pay | Admitting: *Deleted

## 2014-01-05 NOTE — Telephone Encounter (Signed)
Pt called in question if his genetic results are available and I didn't see them scanned in so I informed him that we do not have a counselor here until late next week, but I would email them and have them to check into it and give him a call.  Emailed both Cat and Ofri with this request.

## 2014-02-23 ENCOUNTER — Encounter: Payer: Self-pay | Admitting: Neurology

## 2014-04-28 ENCOUNTER — Ambulatory Visit (INDEPENDENT_AMBULATORY_CARE_PROVIDER_SITE_OTHER): Payer: BC Managed Care – PPO | Admitting: Family Medicine

## 2014-04-28 ENCOUNTER — Encounter: Payer: Self-pay | Admitting: Family Medicine

## 2014-04-28 VITALS — BP 110/69 | HR 73 | Temp 98.2°F | Ht 72.0 in | Wt 157.4 lb

## 2014-04-28 DIAGNOSIS — J0100 Acute maxillary sinusitis, unspecified: Secondary | ICD-10-CM

## 2014-04-28 DIAGNOSIS — J0101 Acute recurrent maxillary sinusitis: Secondary | ICD-10-CM

## 2014-04-28 MED ORDER — AMOXICILLIN 875 MG PO TABS: 875.0000 mg | ORAL_TABLET | Freq: Two times a day (BID) | ORAL | Status: DC

## 2014-04-28 MED ORDER — METHYLPREDNISOLONE ACETATE 80 MG/ML IJ SUSP
80.0000 mg | Freq: Once | INTRAMUSCULAR | Status: AC
  Administered 2014-04-28: 80 mg via INTRAMUSCULAR

## 2014-04-28 NOTE — Progress Notes (Signed)
   Subjective:    Patient ID: Philip Hernandez, male    DOB: 07-07-1956, 58 y.o.   MRN: [id number]  HPI  This 58 y.o. male presents for evaluation of uri sx's.  Review of Systems    No chest pain, SOB, HA, dizziness, vision change, N/V, diarrhea, constipation, dysuria, urinary urgency or frequency, myalgias, arthralgias or rash.  Objective:   Physical Exam  Vital signs noted  Well developed well nourished male.  HEENT - Head atraumatic Normocephalic                Eyes - PERRLA, Conjuctiva - clear Sclera- Clear EOMI                Ears - EAC's Wnl TM's Wnl Gross Hearing WNL                Nose - Nares patent                 Throat - oropharanx wnl Respiratory - Lungs CTA bilateral Cardiac - RRR S1 and S2 without murmur GI - Abdomen soft Nontender and bowel sounds active x 4 Extremities - No edema. Neuro - Grossly intact.      Assessment & Plan:  Acute recurrent maxillary sinusitis - Plan: amoxicillin (AMOXIL) 875 MG tablet, methylPREDNISolone acetate (DEPO-MEDROL) injection 80 mg  Push po fluids, rest, tylenol and motrin otc prn as directed for fever, arthralgias, and myalgias.  Follow up prn if sx's continue or persist.  Lysbeth Penner FNP

## 2014-04-28 NOTE — Addendum Note (Signed)
Addended by: Marin Olp on: 04/28/2014 07:02 PM   Modules accepted: Orders

## 2014-05-11 ENCOUNTER — Telehealth: Payer: Self-pay | Admitting: Family Medicine

## 2014-05-11 NOTE — Telephone Encounter (Signed)
Made appt w/ Dr. Sabra Heck

## 2014-05-12 ENCOUNTER — Ambulatory Visit (INDEPENDENT_AMBULATORY_CARE_PROVIDER_SITE_OTHER): Payer: BC Managed Care – PPO | Admitting: Family Medicine

## 2014-05-12 ENCOUNTER — Ambulatory Visit (INDEPENDENT_AMBULATORY_CARE_PROVIDER_SITE_OTHER): Payer: BC Managed Care – PPO

## 2014-05-12 ENCOUNTER — Encounter: Payer: Self-pay | Admitting: Family Medicine

## 2014-05-12 VITALS — BP 118/77 | HR 87 | Temp 98.7°F | Resp 18 | Ht 72.0 in | Wt 154.0 lb

## 2014-05-12 DIAGNOSIS — K219 Gastro-esophageal reflux disease without esophagitis: Secondary | ICD-10-CM

## 2014-05-12 DIAGNOSIS — R05 Cough: Secondary | ICD-10-CM

## 2014-05-12 DIAGNOSIS — R059 Cough, unspecified: Secondary | ICD-10-CM

## 2014-05-12 MED ORDER — OMEPRAZOLE 40 MG PO CPDR: 40.0000 mg | DELAYED_RELEASE_CAPSULE | Freq: Every day | ORAL | Status: DC

## 2014-05-12 MED ORDER — HYDROCODONE-HOMATROPINE 5-1.5 MG/5ML PO SYRP
5.0000 mL | ORAL_SOLUTION | Freq: Three times a day (TID) | ORAL | Status: DC | PRN
Start: 2014-05-12 — End: 2014-06-16

## 2014-05-12 MED ORDER — AMOXICILLIN-POT CLAVULANATE 875-125 MG PO TABS: 1.0000 | ORAL_TABLET | Freq: Two times a day (BID) | ORAL | Status: DC

## 2014-05-12 NOTE — Progress Notes (Signed)
   Subjective:    Patient ID: Philip Hernandez, male    DOB: 10/04/1955, 58 y.o.   MRN: [id number]  HPI 58 year old gentleman with persistent cough. He was treated 2 weeks ago for sinusitis with amoxicillin. The cough has persisted. It is often productive of yellow sputum. He takes Zyrtec chronically. There is a past history of sarcoidosis but he was released from pulmonary care within the last year    Review of Systems  Constitutional: Negative.   HENT: Negative.   Eyes: Negative.   Respiratory: Positive for cough. Negative for shortness of breath.   Cardiovascular: Negative.  Negative for chest pain and leg swelling.  Gastrointestinal: Negative.   Genitourinary: Negative.   Musculoskeletal: Negative.   Skin: Negative.   Neurological: Negative.   Psychiatric/Behavioral: Negative.   All other systems reviewed and are negative.      Objective:   Physical Exam  Constitutional: He appears well-developed and well-nourished.  HENT:  Head: Normocephalic.  Cardiovascular: Normal rate and regular rhythm.   Pulmonary/Chest: He has wheezes (and rhonchi).   BP 118/77  Pulse 87  Temp(Src) 98.7 F (37.1 C) (Oral)  Resp 18  Ht 6' (1.829 m)  Wt 154 lb (69.854 kg)  BMI 20.88 kg/m2  SpO2 94%       Assessment & Plan:  1. Cough CXR is normal to my exam but there are adventitious sounds R>L.  I believe he may have bronchitis - DG Chest 2 View; Future Rx: Augmentin 875, Mucinex, Hycodan for HS, hold anti-histamine, and sample Symbicort    2. Gastroesophageal reflux disease, esophagitis presence not specified Denies sx as long as he takes PPI  Wardell Honour MD

## 2014-05-12 NOTE — Patient Instructions (Signed)

## 2014-05-25 ENCOUNTER — Other Ambulatory Visit (INDEPENDENT_AMBULATORY_CARE_PROVIDER_SITE_OTHER): Payer: BC Managed Care – PPO

## 2014-05-25 DIAGNOSIS — G609 Hereditary and idiopathic neuropathy, unspecified: Secondary | ICD-10-CM

## 2014-05-25 DIAGNOSIS — E785 Hyperlipidemia, unspecified: Secondary | ICD-10-CM

## 2014-05-25 DIAGNOSIS — M25572 Pain in left ankle and joints of left foot: Secondary | ICD-10-CM

## 2014-05-25 NOTE — Progress Notes (Signed)
Lab only 

## 2014-05-26 ENCOUNTER — Telehealth: Payer: Self-pay | Admitting: Family Medicine

## 2014-05-26 LAB — CMP14+EGFR
ALT: 9 IU/L (ref 0–44)
AST: 16 IU/L (ref 0–40)
Albumin/Globulin Ratio: 1.4 (ref 1.1–2.5)
Albumin: 4 g/dL (ref 3.5–5.5)
Alkaline Phosphatase: 96 IU/L (ref 39–117)
BUN/Creatinine Ratio: 15 (ref 9–20)
BUN: 17 mg/dL (ref 6–24)
CHLORIDE: 100 mmol/L (ref 97–108)
CO2: 23 mmol/L (ref 18–29)
Calcium: 9.4 mg/dL (ref 8.7–10.2)
Creatinine, Ser: 1.17 mg/dL (ref 0.76–1.27)
GFR calc Af Amer: 80 mL/min/{1.73_m2} (ref >59)
GFR calc non Af Amer: 69 mL/min/{1.73_m2} (ref >59)
Globulin, Total: 2.8 g/dL (ref 1.5–4.5)
Glucose: 84 mg/dL (ref 65–99)
Potassium: 4.7 mmol/L (ref 3.5–5.2)
Sodium: 141 mmol/L (ref 134–144)
Total Bilirubin: 0.7 mg/dL (ref 0.0–1.2)
Total Protein: 6.8 g/dL (ref 6.0–8.5)

## 2014-05-26 LAB — NMR, LIPOPROFILE
Cholesterol: 140 mg/dL (ref 100–199)
HDL Cholesterol by NMR: 36 mg/dL — ABNORMAL LOW (ref >39)
HDL Particle Number: 25.4 umol/L — ABNORMAL LOW (ref >=30.5)
LDL Particle Number: 1133 nmol/L — ABNORMAL HIGH (ref <1000)
LDL Size: 20.6 nm (ref >20.5)
LDLC SERPL CALC-MCNC: 86 mg/dL (ref 0–99)
LP-IR SCORE: 49 — AB (ref <=45)
Small LDL Particle Number: 597 nmol/L — ABNORMAL HIGH (ref <=527)
Triglycerides by NMR: 89 mg/dL (ref 0–149)

## 2014-05-26 LAB — URIC ACID: URIC ACID: 6.8 mg/dL (ref 3.7–8.6)

## 2014-05-26 NOTE — Telephone Encounter (Signed)
Message copied by Waverly Ferrari on Wed May 26, 2014 12:16 PM ------      Message from: Wardell Honour      Created: Wed May 26, 2014  7:53 AM       Labs okay; has appt 9-16 ------

## 2014-06-02 ENCOUNTER — Encounter: Payer: Self-pay | Admitting: Family Medicine

## 2014-06-02 ENCOUNTER — Ambulatory Visit (INDEPENDENT_AMBULATORY_CARE_PROVIDER_SITE_OTHER): Payer: BC Managed Care – PPO | Admitting: Family Medicine

## 2014-06-02 ENCOUNTER — Other Ambulatory Visit: Payer: Self-pay | Admitting: *Deleted

## 2014-06-02 VITALS — BP 109/71 | HR 69 | Temp 97.8°F | Ht 72.0 in | Wt 155.2 lb

## 2014-06-02 DIAGNOSIS — R059 Cough, unspecified: Secondary | ICD-10-CM

## 2014-06-02 DIAGNOSIS — E790 Hyperuricemia without signs of inflammatory arthritis and tophaceous disease: Secondary | ICD-10-CM

## 2014-06-02 DIAGNOSIS — E785 Hyperlipidemia, unspecified: Secondary | ICD-10-CM

## 2014-06-02 DIAGNOSIS — R05 Cough: Secondary | ICD-10-CM

## 2014-06-02 NOTE — Progress Notes (Signed)
Subjective:    Patient ID: Philip Hernandez, male    DOB: [birth year], 58 y.o.   MRN: [id number]  HPI  58 year old gentleman here to followup cough. At his last visit he was started on Augmentin and given a sample Symbicort as well as some hydrocodone for cough. He has improved, especially in terms of the amount coughed up and color, but still has some persistent cough. He denies shortness of breath.    Review of Systems  Constitutional: Negative.   HENT: Negative.   Eyes: Negative.   Respiratory: Positive for cough. Negative for shortness of breath.   Cardiovascular: Negative.  Negative for chest pain and leg swelling.  Gastrointestinal: Negative.   Genitourinary: Negative.   Musculoskeletal: Negative.   Skin: Negative.   Neurological: Negative.   Psychiatric/Behavioral: Negative.   All other systems reviewed and are negative.      Objective:   Physical Exam  Constitutional: He is oriented to person, place, and time. He appears well-developed and well-nourished.  HENT:  Head: Normocephalic.  Right Ear: External ear normal.  Left Ear: External ear normal.  Nose: Nose normal.  Mouth/Throat: Oropharynx is clear and moist.  Eyes: Conjunctivae and EOM are normal. Pupils are equal, round, and reactive to light.  Neck: Normal range of motion. Neck supple.  Cardiovascular: Normal rate, regular rhythm, normal heart sounds and intact distal pulses.   Pulmonary/Chest: Effort normal and breath sounds normal.  Abdominal: Soft. Bowel sounds are normal.  Musculoskeletal: Normal range of motion.  Neurological: He is alert and oriented to person, place, and time.  Skin: Skin is warm and dry.  Psychiatric: He has a normal mood and affect. His behavior is normal. Judgment and thought content normal.    BP 109/71  Pulse 69  Temp(Src) 97.8 F (36.6 C) (Oral)  Ht 6' (1.829 m)  Wt 155 lb 3.2 oz (70.398 kg)  BMI 21.04 kg/m2      Assessment & Plan:  1. Cough Improved but persists.  Rec  continue Symbicort  Spent time discussing labs and risk assessment he had done  @[facility] @  Patient ID: Philip Hernandez MRN: [id number], DOB: May 11, 1956, 58 y.o. Date of Encounter: 06/02/2014, 11:54 AM  Primary Physician: Redge Gainer, MD  Chief Complaint:   HPI: 58 y.o. year old male with history below presents with    Past Medical History  Diagnosis Date  . Sarcoidosis dec 2011    stage 2 pulmonary, neck nodes, splenic granuloma  . GERD (gastroesophageal reflux disease) Jan 2012    [name]. Bx proven.  Deve abd pain in Jan 2012 following high dose steroids for sarcoid. s/p EGD and improvement in symptoms following PPI  . Malignant neoplasm of prostate dec 2010    [name]. s/p surgery dec 2010, hx of rnormal PSA  since then  . Cough     chronic sinus issues, GERD with poor dietary control and Pulmonary Sarcoid.   Marland Kitchen Swelling, mass, or lump in head and neck   . Other and unspecified mycoses   . Painful respiration   . Hypogonadism male   . Colon polyp   . Kidney stone   . Peripheral neuropathy   . Colon polyps 2015     Home Meds: Prior to Admission medications   Medication Sig Start Date End Date Taking? Authorizing Provider  acetaminophen (TYLENOL) 500 MG tablet Take 500 mg by mouth every 6 (six) hours as needed.   Yes Historical Provider, MD  cetirizine (ZYRTEC) 10 MG tablet  Take 10 mg by mouth daily.   Yes Historical Provider, MD  cholecalciferol (VITAMIN D) 1000 UNITS tablet Take 1,000 Units by mouth daily.     Yes Historical Provider, MD  fluticasone (FLONASE) 50 MCG/ACT nasal spray Place 2 sprays into the nose daily. 12/08/12  Yes Vernie Shanks, MD  gabapentin (NEURONTIN) 800 MG tablet Take 1 tablet (800 mg total) by mouth 4 (four) times daily. 12/10/13  Yes Philmore Pali, NP  HYDROcodone-homatropine (HYCODAN) 5-1.5 MG/5ML syrup Take 5 mLs by mouth every 8 (eight) hours as needed for cough. 05/12/14  Yes Wardell Honour, MD  Multiple Vitamins-Minerals (AIRBORNE  PO) Take by mouth daily.   Yes Historical Provider, MD  omeprazole (PRILOSEC) 40 MG capsule Take 1 capsule (40 mg total) by mouth daily. 05/12/14 05/12/15 Yes Wardell Honour, MD    Allergies: No Known Allergies  History   Social History  . Marital Status: Married    Spouse Name: N/A    Number of Children: 2  . Years of Education: N/A   Occupational History  . unemployed    Social History Main Topics  . Smoking status: Never Smoker   . Smokeless tobacco: Never Used  . Alcohol Use: No  . Drug Use: No  . Sexual Activity: Not on file   Other Topics Concern  . Not on file   Social History Narrative   Married   2 children     Review of Systems: Constitutional: negative for chills, fever, night sweats, weight changes, or fatigue  HEENT: negative for vision changes, hearing loss, congestion, rhinorrhea, ST, epistaxis, or sinus pressure Cardiovascular: negative for chest pain or palpitations Respiratory: negative for hemoptysis, wheezing, shortness of breath, or cough Abdominal: negative for abdominal pain, nausea, vomiting, diarrhea, or constipation Dermatological: negative for rash Neurologic: negative for headache, dizziness, or syncope All other systems reviewed and are otherwise negative with the exception to those above and in the HPI.   Physical Exam: Blood pressure 109/71, pulse 69, temperature 97.8 F (36.6 C), temperature source Oral, height 6' (1.829 m), weight 155 lb 3.2 oz (70.398 kg)., Body mass index is 21.04 kg/(m^2). General: Well developed, well nourished, in no acute distress. Head: Normocephalic, atraumatic, eyes without discharge, sclera non-icteric, nares are without discharge. Bilateral auditory canals clear, TM's are without perforation, pearly grey and translucent with reflective cone of light bilaterally. Oral cavity moist, posterior pharynx without exudate, erythema, peritonsillar abscess, or post nasal drip.  Neck: Supple. No thyromegaly. Full ROM.  No lymphadenopathy. Lungs: Clear bilaterally to auscultation without wheezes, rales, or rhonchi. Breathing is unlabored. Heart: RRR with S1 S2. No murmurs, rubs, or gallops appreciated. Abdomen: Soft, non-tender, non-distended with normoactive bowel sounds. No hepatomegaly. No rebound/guarding. No obvious abdominal masses. Msk:  Strength and tone normal for age. Extremities/Skin: Warm and dry. No clubbing or cyanosis. No edema. No rashes or suspicious lesions. Neuro: Alert and oriented X 3. Moves all extremities spontaneously. Gait is normal. CNII-XII grossly in tact. Psych:  Responds to questions appropriately with a normal affect.   Labs:   ASSESSMENT AND PLAN:   1.Cough  Improved; cont with Symbicort   Wardell Honour MD

## 2014-06-16 ENCOUNTER — Encounter: Payer: Self-pay | Admitting: Neurology

## 2014-06-16 ENCOUNTER — Encounter (INDEPENDENT_AMBULATORY_CARE_PROVIDER_SITE_OTHER): Payer: Self-pay

## 2014-06-16 ENCOUNTER — Ambulatory Visit (INDEPENDENT_AMBULATORY_CARE_PROVIDER_SITE_OTHER): Payer: BC Managed Care – PPO | Admitting: Neurology

## 2014-06-16 VITALS — BP 120/63 | HR 78 | Wt 155.8 lb

## 2014-06-16 DIAGNOSIS — G609 Hereditary and idiopathic neuropathy, unspecified: Secondary | ICD-10-CM

## 2014-06-16 MED ORDER — GABAPENTIN 800 MG PO TABS: 800.0000 mg | ORAL_TABLET | Freq: Four times a day (QID) | ORAL | Status: DC

## 2014-06-16 NOTE — Patient Instructions (Signed)
Continue no gout pending 800 mg 4 times daily for neuropathic pain and he may take an additional half a tablet as needed for severe pain. Patient is declining having Lyrica or Topamax added at the present time. He was given a refill for gabapentin Return for followup in 6 months or call earlier if necessary.

## 2014-06-16 NOTE — Progress Notes (Signed)
PATIENT: Philip Hernandez DOB: 1956/07/03  REASON FOR VISIT: follow up for neuropathy HISTORY FROM: patient  HISTORY OF PRESENT ILLNESS: 58 year old Caucasian male with history of bilateral hands and feet paresthesias and weakness since March 2013 likely from peripheral neuropathy returns for followup. Numbness continues, no relief. No treatable cause identified. Neuropathy may be likely to his underlying sarcoidosis. He has been decreased with his prednisone.   Update 11/19/2012: He returns for followup after last visit on 07/14/2012. He states his paresthesias and tingling are unchanged and about the same. He states that gabapentin does help him but the effect lasted only about 5-6 hours. He is tolerating it well without any significant side effects. He states his sarcoidosis is stable and he is considering seeing his pulmonologist to discuss tapering and stopping his prednisone. He is complaining of left hip localized pain which he feels started after the EMG study. His pain is intermittent and at times sharp. Tylenol relieves the pain. He also complains of decreased stamina and gets higher easily and feels he will be unable to go back to work because of his pain and lack of stamina. He plans to apply for disability.   Update 02/25/2013: He returns for followup since his last visit on 11/19/2012. He states his paresthesias and tingling are unchanged and about the same. He also complains of shooting pain in his toes. His gabapentin was increased at last visit and he states it does help him. He complains of increased muscle weakness in bilateral legs. He states that his balance is a problem but he has not fallen. He knows he has to walk slowly and steady and not make any sudden movements. Sometimes when he is down on the ground it difficult for him to get up by himself. He has applied for disability at the beginning of May. His prednisone was completely tapered in February with good results.   UPDATE  05/27/13: Philip Hernandez returns for 3 month follow up visit for neuropathy. He feels like his symptoms are stable and have not progressed. He tried Elavil after last visit sporadically, and taking a whole tablet instead of 1/2 tablet as recommended and said it made him very lethargic the next day after he took it. Three days ago he started taking 1/2 tablet each night and is tolerating much better so far. He thinks he is regaining some muscle strength back in his legs. He states that his feet are very painful the next day after if he walks a lot in a day. He is tolerating the Gabapentin well. He complains of hip pain. Xray showed bilateral degenerative changes.   UPDATE 12/10/13 (LL):  Philip Hernandez returns for follow up.  His symptoms are unchanged.  His pain is tolerable with Gabapentin.  He was granted disability after 5 months.  UPDATE 06/16/2014 : He returns for followup after last was at term 6 month ago. Continues to have pain in his feet as well as tingling and numbness. He feels Neurontin 800 mg 4 times a day seems to work quite well but he has good days and bad days. Yesterday he missed one dose and felt bad the next day. He is occasionally off balance particularly when tired but he has not had any falls. He has not had any new health problems since last visit REVIEW OF SYSTEMS: Full 14 system review of systems performed and notable only for fatigue, aching muscles, muscle cramps, walking difficulty, weakness, numbness  ALLERGIES: No Known Allergies  HOME MEDICATIONS: Outpatient Prescriptions Prior to Visit  Medication Sig Dispense Refill  . acetaminophen (TYLENOL) 500 MG tablet Take 500 mg by mouth every 6 (six) hours as needed.      . cetirizine (ZYRTEC) 10 MG tablet Take 10 mg by mouth daily.      . cholecalciferol (VITAMIN D) 1000 UNITS tablet Take 1,000 Units by mouth daily.        . Multiple Vitamins-Minerals (AIRBORNE PO) Take by mouth daily.      Marland Kitchen omeprazole (PRILOSEC) 40 MG capsule Take 1  capsule (40 mg total) by mouth daily.  30 capsule  2  . fluticasone (FLONASE) 50 MCG/ACT nasal spray Place 2 sprays into the nose daily.  16 g  1  . gabapentin (NEURONTIN) 800 MG tablet Take 1 tablet (800 mg total) by mouth 4 (four) times daily.  360 tablet  2  . HYDROcodone-homatropine (HYCODAN) 5-1.5 MG/5ML syrup Take 5 mLs by mouth every 8 (eight) hours as needed for cough.  120 mL  0   No facility-administered medications prior to visit.     PHYSICAL EXAM  Filed Vitals:   06/16/14 1547  BP: 120/63  Pulse: 78  Weight: 155 lb 12.8 oz (70.67 kg)   Body mass index is 21.13 kg/(m^2).  GENERAL EXAM:  Pleasant middle aged Caucasian male, in no distress. Afebrile.  Head: Nontraumatic.  Ears, Nose and throat: Hearing is normal.  Neck: Supple without bruit.  Respiratory: CTA  Musculoskeletal: no deformity.   Skin: no rash. Old surgical scar on neck from lymph node removal.   CARDIOVASCULAR:  Regular rate and rhythm, no murmurs, no carotid bruits  NEUROLOGIC:  MENTAL STATUS: awake, alert, language fluent, comprehension intact, naming intact  CRANIAL NERVE: no papilledema on fundoscopic exam, pupils equal and reactive to light, visual fields full to confrontation, extraocular muscles intact, no nystagmus, facial sensation and strength symmetric, uvula midline, shoulder shrug symmetric, tongue midline.  MOTOR: normal bulk and tone, full strength in the BUE, BLE. Reveals no upper or lower extremity drift. Mild weakness in bilateral ankle dorsiflexors, right more than left  SENSORY: Touch and pinprick sensations are diminished at toes bilaterally and to ankle on right and over fingertips bilaterally. Impaired vibration sense of his toes and fingertips bilaterally. Position sense is preserved.  COORDINATION: finger-nose-finger, fine finger movements normal  REFLEXES: deep tendon reflexes present and symmetric  GAIT/STATION: Slow cautious steady gait. Able to do tandem walk with mild  difficulty. Able to stand on toes and heels with moderate difficulty. romberg is negative  01/08/13 NCV/EMG  Nerve conduction studies done on both lower extremities and the left upper extremity shows evidence of primarily motor involvement of the nerves in the legs, normal on the left arm. EMG evaluation of the left lower extremity shows mild primarily distal acute and chronic denervation, consistent with a peripheral neuropathy. There is no evidence of nerve root involvement. EMG evaluation shows evidence of a mononeuropathy affecting the median nerve proximally, again without evidence of a cervical radiculopathy. In the appropriate clinical setting, the study could be consistent with Guillian-Barre syndrome, but some chronic features of denervation also seen. Sarcoidosis may be another consideration, but no sensory involvement is noted.   ASSESSMENT AND PLAN  59 year old Caucasian right-handed male with history of bilateral hands and feet paresthesias and weakness since March 2013 likely from peripheral neuropathy returns for followup. Numbness continues, with moderate relief with gabapentin. No treatable cause is identified. Neuropathy may likely be related to his underlying sarcoidosis.  His prednisone was stopped in February 2014.   Plan:  Continue gabapentin 800 mg 4 times a day. She may add half a tablet as needed for severe pain Return for followup in 6 months with Charlott Holler, NP, sooner as needed.  Meds ordered this encounter  Medications  . gabapentin (NEURONTIN) 800 MG tablet    Sig: Take 1 tablet (800 mg total) by mouth 4 (four) times daily.    Dispense:  360 tablet    Refill:  2   Return in about 6 months (around 12/15/2014).  Antony Contras, MD  06/16/2014, 4:29 PM [facility] [address], [place], [place] [address] [phone]  Note: This document was prepared with digital dictation and possible smart phrase technology. Any transcriptional errors that  result from this process are unintentional.

## 2014-06-30 ENCOUNTER — Other Ambulatory Visit: Payer: Self-pay | Admitting: Nurse Practitioner

## 2014-07-13 ENCOUNTER — Ambulatory Visit (INDEPENDENT_AMBULATORY_CARE_PROVIDER_SITE_OTHER): Payer: BC Managed Care – PPO | Admitting: Family Medicine

## 2014-07-13 ENCOUNTER — Ambulatory Visit: Payer: BC Managed Care – PPO

## 2014-07-13 ENCOUNTER — Encounter: Payer: Self-pay | Admitting: Family Medicine

## 2014-07-13 VITALS — BP 110/70 | HR 66 | Temp 97.9°F | Ht 72.0 in | Wt 159.8 lb

## 2014-07-13 DIAGNOSIS — J206 Acute bronchitis due to rhinovirus: Secondary | ICD-10-CM

## 2014-07-13 MED ORDER — AZITHROMYCIN 250 MG PO TABS: ORAL_TABLET | ORAL | Status: DC

## 2014-07-13 MED ORDER — BENZONATATE 100 MG PO CAPS: 100.0000 mg | ORAL_CAPSULE | Freq: Three times a day (TID) | ORAL | Status: DC | PRN

## 2014-07-13 MED ORDER — METHYLPREDNISOLONE ACETATE 80 MG/ML IJ SUSP
80.0000 mg | Freq: Once | INTRAMUSCULAR | Status: AC
Start: 2014-07-13 — End: 2014-07-13
  Administered 2014-07-13: 80 mg via INTRAMUSCULAR

## 2014-07-13 NOTE — Progress Notes (Signed)
   Subjective:    Patient ID: Philip Hernandez, male    DOB: 06/05/1956, 59 y.o.   MRN: [id number]  HPI This 58 y.o. male presents for evaluation of uri sx's.   Review of Systems No chest pain, SOB, HA, dizziness, vision change, N/V, diarrhea, constipation, dysuria, urinary urgency or frequency, myalgias, arthralgias or rash.     Objective:   Physical Exam Vital signs noted  Well developed well nourished male.  HEENT - Head atraumatic Normocephalic                Eyes - PERRLA, Conjuctiva - clear Sclera- Clear EOMI                Ears - EAC's Wnl TM's Wnl Gross Hearing WNL                Nose - Nares patent                 Throat - oropharanx wnl Respiratory - Lungs CTA bilateral Cardiac - RRR S1 and S2 without murmur GI - Abdomen soft Nontender and bowel sounds active x 4 Extremities - No edema. Neuro - Grossly intact.       Assessment & Plan:  Acute bronchitis due to Rhinovirus - Plan: azithromycin (ZITHROMAX) 250 MG tablet, methylPREDNISolone acetate (DEPO-MEDROL) injection 80 mg, benzonatate (TESSALON PERLES) 100 MG capsule  Lysbeth Penner FNP

## 2014-08-03 ENCOUNTER — Encounter: Payer: Self-pay | Admitting: Neurology

## 2014-08-10 ENCOUNTER — Other Ambulatory Visit: Payer: Self-pay | Admitting: Family Medicine

## 2014-08-11 NOTE — Telephone Encounter (Signed)
Please advise on refill- last seen by Dietrich Pates on 07/13/14, and Dr Sabra Heck 06/02/14 for cough.  Sending to Dr. Sabra Heck as Dietrich Pates is out of the office this week.

## 2014-08-23 NOTE — Telephone Encounter (Signed)
Please review

## 2014-09-03 ENCOUNTER — Ambulatory Visit: Payer: BC Managed Care – PPO

## 2014-09-06 ENCOUNTER — Ambulatory Visit (INDEPENDENT_AMBULATORY_CARE_PROVIDER_SITE_OTHER): Payer: BC Managed Care – PPO | Admitting: Nurse Practitioner

## 2014-09-06 ENCOUNTER — Encounter: Payer: Self-pay | Admitting: Nurse Practitioner

## 2014-09-06 VITALS — BP 124/83 | HR 67 | Temp 97.1°F | Ht 72.0 in | Wt 160.0 lb

## 2014-09-06 DIAGNOSIS — W57XXXA Bitten or stung by nonvenomous insect and other nonvenomous arthropods, initial encounter: Secondary | ICD-10-CM

## 2014-09-06 DIAGNOSIS — S30860A Insect bite (nonvenomous) of lower back and pelvis, initial encounter: Secondary | ICD-10-CM

## 2014-09-06 MED ORDER — DOXYCYCLINE HYCLATE 100 MG PO TABS: 100.0000 mg | ORAL_TABLET | Freq: Two times a day (BID) | ORAL | Status: DC

## 2014-09-06 NOTE — Progress Notes (Signed)
   Subjective:    Patient ID: Philip Hernandez, male    DOB: [birth year], 58 y.o.   MRN: [id number]  HPI  Patient pulled a tick off of his right butt cheek- Does not know how long it was on there. He feels tired and achy.   Review of Systems  Constitutional: Negative.   HENT: Negative.   Respiratory: Negative.   Cardiovascular: Negative.   Gastrointestinal: Negative.   Genitourinary: Negative.   Neurological: Negative.   Psychiatric/Behavioral: Negative.   All other systems reviewed and are negative.      Objective:   Physical Exam  Constitutional: He is oriented to person, place, and time. He appears well-developed and well-nourished.  Cardiovascular: Normal rate, regular rhythm and normal heart sounds.   Pulmonary/Chest: Effort normal and breath sounds normal.  Neurological: He is alert and oriented to person, place, and time.  Skin: Skin is warm and dry.  1cm erythematous sopt with scab on right buttocks  Psychiatric: He has a normal mood and affect. His behavior is normal. Judgment and thought content normal.   BP 124/83 mmHg  Pulse 67  Temp(Src) 97.1 F (36.2 C) (Oral)  Ht 6' (1.829 m)  Wt 160 lb (72.576 kg)  BMI 21.70 kg/m2        Assessment & Plan:  1. Tick bite of buttock, initial encounter Clean with peroxide BID Do not pick at area RTO prn - doxycycline (VIBRA-TABS) 100 MG tablet; Take 1 tablet (100 mg total) by mouth 2 (two) times daily.  Dispense: 28 tablet; Refill: 0   Mary-Margaret Hassell Done, FNP

## 2014-09-06 NOTE — Patient Instructions (Signed)
Tick Bite Information Ticks are insects that attach themselves to the skin and draw blood for food. There are various types of ticks. Common types include wood ticks and deer ticks. Most ticks live in shrubs and grassy areas. Ticks can climb onto your body when you make contact with leaves or grass where the tick is waiting. The most common places on the body for ticks to attach themselves are the scalp, neck, armpits, waist, and groin. Most tick bites are harmless, but sometimes ticks carry germs that cause diseases. These germs can be spread to a person during the tick's feeding process. The chance of a disease spreading through a tick bite depends on:   The type of tick.  Time of year.   How long the tick is attached.   Geographic location.  HOW CAN YOU PREVENT TICK BITES? Take these steps to help prevent tick bites when you are outdoors:  Wear protective clothing. Long sleeves and long pants are best.   Wear white clothes so you can see ticks more easily.  Tuck your pant legs into your socks.   If walking on a trail, stay in the middle of the trail to avoid brushing against bushes.  Avoid walking through areas with long grass.  Put insect repellent on all exposed skin and along boot tops, pant legs, and sleeve cuffs.   Check clothing, hair, and skin repeatedly and before going inside.   Brush off any ticks that are not attached.  Take a shower or bath as soon as possible after being outdoors.  WHAT IS THE PROPER WAY TO REMOVE A TICK? Ticks should be removed as soon as possible to help prevent diseases caused by tick bites. 1. If latex gloves are available, put them on before trying to remove a tick.  2. Using fine-point tweezers, grasp the tick as close to the skin as possible. You may also use curved forceps or a tick removal tool. Grasp the tick as close to its head as possible. Avoid grasping the tick on its body. 3. Pull gently with steady upward pressure until  the tick lets go. Do not twist the tick or jerk it suddenly. This may break off the tick's head or mouth parts. 4. Do not squeeze or crush the tick's body. This could force disease-carrying fluids from the tick into your body.  5. After the tick is removed, wash the bite area and your hands with soap and water or other disinfectant such as alcohol. 6. Apply a small amount of antiseptic cream or ointment to the bite site.  7. Wash and disinfect any instruments that were used.  Do not try to remove a tick by applying a hot match, petroleum jelly, or fingernail polish to the tick. These methods do not work and may increase the chances of disease being spread from the tick bite.  WHEN SHOULD YOU SEEK MEDICAL CARE? Contact your health care provider if you are unable to remove a tick from your skin or if a part of the tick breaks off and is stuck in the skin.  After a tick bite, you need to be aware of signs and symptoms that could be related to diseases spread by ticks. Contact your health care provider if you develop any of the following in the days or weeks after the tick bite:  Unexplained fever.  Rash. A circular rash that appears days or weeks after the tick bite may indicate the possibility of Lyme disease. The rash may resemble   a target with a bull's-eye and may occur at a different part of your body than the tick bite.  Redness and swelling in the area of the tick bite.   Tender, swollen lymph glands.   Diarrhea.   Weight loss.   Cough.   Fatigue.   Muscle, joint, or bone pain.   Abdominal pain.   Headache.   Lethargy or a change in your level of consciousness.  Difficulty walking or moving your legs.   Numbness in the legs.   Paralysis.  Shortness of breath.   Confusion.   Repeated vomiting.  Document Released: 08/31/2000 Document Revised: 06/24/2013 Document Reviewed: 02/11/2013 [employer] Patient Information 2015 [employer]. This information is  not intended to replace advice given to you by your health care provider. Make sure you discuss any questions you have with your health care provider.  

## 2014-09-24 ENCOUNTER — Ambulatory Visit (INDEPENDENT_AMBULATORY_CARE_PROVIDER_SITE_OTHER): Payer: BLUE CROSS/BLUE SHIELD | Admitting: *Deleted

## 2014-09-24 ENCOUNTER — Ambulatory Visit: Payer: BLUE CROSS/BLUE SHIELD

## 2014-09-24 DIAGNOSIS — Z23 Encounter for immunization: Secondary | ICD-10-CM

## 2014-10-04 ENCOUNTER — Other Ambulatory Visit: Payer: Self-pay | Admitting: Family Medicine

## 2014-10-05 ENCOUNTER — Telehealth: Payer: Self-pay | Admitting: Gastroenterology

## 2014-10-05 NOTE — Telephone Encounter (Signed)
The patient's insurance is denying payment on some of his genetic testing. The lab has offered to do the appeal if the patient wants. He says he wants to know what was ordered, was anything ordered that was not necessary, was the wrong Dx used and if everything is normal, what is the next step. Can I give him a copy of his labs and the genetic counselor note?

## 2014-10-12 NOTE — Telephone Encounter (Signed)
Advised. He will come by to pick up the notes. HIPPA form will be completed for him to sign.

## 2014-10-12 NOTE — Telephone Encounter (Signed)
The tests ordered was directed by the genetics counselor at the cancer center and, I believe, is standard testing to rule out Lynch syndrome.  Asked to which tests were ordered, I would defer to Philip Hernandez at the cancer center who ordered the test.  Very importantly the tests came back negative.  On that basis he should have surveillance colonoscopy every 5 years.  You may give him a copy of the notes.

## 2014-11-16 HISTORY — PX: SKIN CANCER EXCISION: SHX779

## 2014-11-25 ENCOUNTER — Other Ambulatory Visit (INDEPENDENT_AMBULATORY_CARE_PROVIDER_SITE_OTHER): Payer: BLUE CROSS/BLUE SHIELD

## 2014-11-25 DIAGNOSIS — E785 Hyperlipidemia, unspecified: Secondary | ICD-10-CM | POA: Diagnosis not present

## 2014-11-25 DIAGNOSIS — E790 Hyperuricemia without signs of inflammatory arthritis and tophaceous disease: Secondary | ICD-10-CM

## 2014-11-25 NOTE — Progress Notes (Signed)
Lab only 

## 2014-11-26 LAB — CMP14+EGFR
A/G RATIO: 1.8 (ref 1.1–2.5)
ALK PHOS: 69 IU/L (ref 39–117)
ALT: 24 IU/L (ref 0–44)
AST: 23 IU/L (ref 0–40)
Albumin: 4.3 g/dL (ref 3.5–5.5)
BILIRUBIN TOTAL: 0.8 mg/dL (ref 0.0–1.2)
BUN / CREAT RATIO: 14 (ref 9–20)
BUN: 16 mg/dL (ref 6–24)
CO2: 25 mmol/L (ref 18–29)
Calcium: 9.6 mg/dL (ref 8.7–10.2)
Chloride: 101 mmol/L (ref 97–108)
Creatinine, Ser: 1.11 mg/dL (ref 0.76–1.27)
GFR, EST AFRICAN AMERICAN: 85 mL/min/{1.73_m2} (ref >59)
GFR, EST NON AFRICAN AMERICAN: 73 mL/min/{1.73_m2} (ref >59)
GLUCOSE: 88 mg/dL (ref 65–99)
Globulin, Total: 2.4 g/dL (ref 1.5–4.5)
POTASSIUM: 4.9 mmol/L (ref 3.5–5.2)
Sodium: 138 mmol/L (ref 134–144)
Total Protein: 6.7 g/dL (ref 6.0–8.5)

## 2014-11-26 LAB — URIC ACID: Uric Acid: 6.4 mg/dL (ref 3.7–8.6)

## 2014-11-26 LAB — LIPID PANEL
CHOL/HDL RATIO: 4 ratio (ref 0.0–5.0)
Cholesterol, Total: 166 mg/dL (ref 100–199)
HDL: 41 mg/dL (ref >39)
LDL CALC: 106 mg/dL — AB (ref 0–99)
Triglycerides: 97 mg/dL (ref 0–149)
VLDL Cholesterol Cal: 19 mg/dL (ref 5–40)

## 2014-12-02 ENCOUNTER — Ambulatory Visit (INDEPENDENT_AMBULATORY_CARE_PROVIDER_SITE_OTHER): Payer: BLUE CROSS/BLUE SHIELD | Admitting: Family Medicine

## 2014-12-02 ENCOUNTER — Encounter: Payer: Self-pay | Admitting: Family Medicine

## 2014-12-02 VITALS — BP 114/67 | HR 66 | Temp 97.2°F | Ht 72.0 in | Wt 163.0 lb

## 2014-12-02 DIAGNOSIS — G629 Polyneuropathy, unspecified: Secondary | ICD-10-CM | POA: Diagnosis not present

## 2014-12-02 DIAGNOSIS — R05 Cough: Secondary | ICD-10-CM

## 2014-12-02 DIAGNOSIS — R059 Cough, unspecified: Secondary | ICD-10-CM
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
## 2014-12-02 NOTE — Patient Instructions (Signed)
Check insurance coverage of Tdap.

## 2014-12-15 ENCOUNTER — Ambulatory Visit: Payer: BC Managed Care – PPO | Admitting: Nurse Practitioner

## 2014-12-27 DIAGNOSIS — Z85828 Personal history of other malignant neoplasm of skin: Secondary | ICD-10-CM | POA: Diagnosis not present

## 2014-12-27 DIAGNOSIS — Z08 Encounter for follow-up examination after completed treatment for malignant neoplasm: Secondary | ICD-10-CM | POA: Diagnosis not present

## 2014-12-28 ENCOUNTER — Ambulatory Visit (INDEPENDENT_AMBULATORY_CARE_PROVIDER_SITE_OTHER): Payer: BLUE CROSS/BLUE SHIELD | Admitting: Neurology

## 2014-12-28 ENCOUNTER — Encounter: Payer: Self-pay | Admitting: Neurology

## 2014-12-28 VITALS — BP 123/79 | HR 67 | Ht 70.5 in | Wt 166.4 lb

## 2014-12-28 DIAGNOSIS — R202 Paresthesia of skin: Secondary | ICD-10-CM | POA: Diagnosis not present

## 2014-12-28 MED ORDER — GABAPENTIN 800 MG PO TABS
800.0000 mg | ORAL_TABLET | Freq: Four times a day (QID) | ORAL | Status: DC
Start: 2014-12-28 — End: 2015-06-29

## 2014-12-28 NOTE — Patient Instructions (Signed)
I had a long discussion with the patient regarding his neuropathic pain and advise him to take gabapentin 800 mg 4 times daily on a regular basis with breakfast, lunch, dinner and at bedtime and not miss any doses. He may take extra half a tablet as needed on days when he is having a lot of symptoms. He was given refills for his gabapentin. Return for follow-up in 6 months with Vaughan Browner, nurse practitioner or call earlier if necessary.

## 2014-12-28 NOTE — Progress Notes (Signed)
PATIENT: Philip Hernandez DOB: May 19, 1956  REASON FOR VISIT: follow up for neuropathy HISTORY FROM: patient  HISTORY OF PRESENT ILLNESS: 59 year old Caucasian male with history of bilateral hands and feet paresthesias and weakness since March 2013 likely from peripheral neuropathy returns for followup. Numbness continues, no relief. No treatable cause identified. Neuropathy may be likely to his underlying sarcoidosis. He has been decreased with his prednisone.   Update 11/19/2012: He returns for followup after last visit on 07/14/2012. He states his paresthesias and tingling are unchanged and about the same. He states that gabapentin does help him but the effect lasted only about 5-6 hours. He is tolerating it well without any significant side effects. He states his sarcoidosis is stable and he is considering seeing his pulmonologist to discuss tapering and stopping his prednisone. He is complaining of left hip localized pain which he feels started after the EMG study. His pain is intermittent and at times sharp. Tylenol relieves the pain. He also complains of decreased stamina and gets higher easily and feels he will be unable to go back to work because of his pain and lack of stamina. He plans to apply for disability.   Update 02/25/2013: He returns for followup since his last visit on 11/19/2012. He states his paresthesias and tingling are unchanged and about the same. He also complains of shooting pain in his toes. His gabapentin was increased at last visit and he states it does help him. He complains of increased muscle weakness in bilateral legs. He states that his balance is a problem but he has not fallen. He knows he has to walk slowly and steady and not make any sudden movements. Sometimes when he is down on the ground it difficult for him to get up by himself. He has applied for disability at the beginning of May. His prednisone was completely tapered in February with good results.   UPDATE  05/27/13: [name] returns for 3 month follow up visit for neuropathy. He feels like his symptoms are stable and have not progressed. He tried Elavil after last visit sporadically, and taking a whole tablet instead of 1/2 tablet as recommended and said it made him very lethargic the next day after he took it. Three days ago he started taking 1/2 tablet each night and is tolerating much better so far. He thinks he is regaining some muscle strength back in his legs. He states that his feet are very painful the next day after if he walks a lot in a day. He is tolerating the Gabapentin well. He complains of hip pain. Xray showed bilateral degenerative changes.   UPDATE 12/10/13 (LL):  Mr. Yielding returns for follow up.  His symptoms are unchanged.  His pain is tolerable with Gabapentin.  He was granted disability after 5 months.  UPDATE 06/16/2014 : He returns for followup after last was at term 6 month ago. Continues to have pain in his feet as well as tingling and numbness. He feels Neurontin 800 mg 4 times a day seems to work quite well but he has good days and bad days. Yesterday he missed one dose and felt bad the next day. He is occasionally off balance particularly when tired but he has not had any falls. He has not had any new health problems since last visit Update 12/28/2014 : He returns for follow-up after last visit 6 months ago. He continues to have intermittent paresthesias in his feet and pain as well as difficulty walking.  He takes gabapentin 800 mg 4 times daily which works quite well but he occasionally forgets to take his pill and notices that the pain is more severe. He is able to sleep most nights but occasionally wakes up with the pain. He interestingly noted cramps in his right hand when he started drinking ice tea but this improved after he stopped it. He tried it twice with the same effect. He has no other new complaints. REVIEW OF SYSTEMS: Full 14 system review of systems performed and  notable only for fatigue, aching muscles, muscle cramps, walking difficulty, weakness, numbness and all other systems negative  ALLERGIES: No Known Allergies  HOME MEDICATIONS: Outpatient Prescriptions Prior to Visit  Medication Sig Dispense Refill  . acetaminophen (TYLENOL) 500 MG tablet Take 500 mg by mouth every 6 (six) hours as needed.    . cetirizine (ZYRTEC) 10 MG tablet Take 10 mg by mouth daily.    . cholecalciferol (VITAMIN D) 1000 UNITS tablet Take 1,000 Units by mouth daily.      . Multiple Vitamins-Minerals (AIRBORNE PO) Take by mouth daily.    Marland Kitchen omeprazole (PRILOSEC) 40 MG capsule TAKE ONE (1) CAPSULE EACH DAY 30 capsule 2  . gabapentin (NEURONTIN) 800 MG tablet Take 1 tablet (800 mg total) by mouth 4 (four) times daily. 360 tablet 2   No facility-administered medications prior to visit.     PHYSICAL EXAM  Filed Vitals:   12/28/14 1007  BP: 123/79  Pulse: 67  Height: 5' 10.5" (1.791 m)  Weight: 166 lb 6.4 oz (75.479 kg)   Body mass index is 23.53 kg/(m^2).  GENERAL EXAM:  Pleasant middle aged Caucasian male, in no distress. Afebrile.  Head: Nontraumatic.  Ears, Nose and throat: Hearing is normal.  Neck: Supple without bruit.  Respiratory: CTA  Musculoskeletal: no deformity.   Skin: no rash. Old surgical scar on neck from lymph node removal.   CARDIOVASCULAR:  Regular rate and rhythm, no murmurs, no carotid bruits  NEUROLOGIC:  MENTAL STATUS: awake, alert, language fluent, comprehension intact, naming intact  CRANIAL NERVE:   fundoscopic exam not done, pupils equal and reactive to light, visual fields full to confrontation, extraocular muscles intact, no nystagmus, facial sensation and strength symmetric, uvula midline, shoulder shrug symmetric, tongue midline.  MOTOR: normal bulk and tone, full strength in the BUE, BLE. Reveals no upper or lower extremity drift. Mild weakness in bilateral ankle dorsiflexors, right more than left  SENSORY: Touch and pinprick  sensations are diminished at toes bilaterally and to ankle on right and over fingertips bilaterally. Impaired vibration sense of his toes and fingertips bilaterally. Position sense is preserved.  COORDINATION: finger-nose-finger, fine finger movements normal  REFLEXES: deep tendon reflexes present and symmetric  GAIT/STATION: Slow cautious steady gait. Able to do tandem walk with mild difficulty. Able to stand on toes and heels with moderate difficulty. romberg is negative  01/08/13 NCV/EMG  Nerve conduction studies done on both lower extremities and the left upper extremity shows evidence of primarily motor involvement of the nerves in the legs, normal on the left arm. EMG evaluation of the left lower extremity shows mild primarily distal acute and chronic denervation, consistent with a peripheral neuropathy. There is no evidence of nerve root involvement. EMG evaluation shows evidence of a mononeuropathy affecting the median nerve proximally, again without evidence of a cervical radiculopathy. In the appropriate clinical setting, the study could be consistent with Guillian-Barre syndrome, but some chronic features of denervation also seen. Sarcoidosis may be another  consideration, but no sensory involvement is noted.   ASSESSMENT AND PLAN  59 year old Caucasian right-handed male with history of bilateral hands and feet paresthesias and weakness since March 2013 likely from peripheral neuropathy returns for followup. Numbness continues, with moderate relief with gabapentin. No treatable cause is identified. Neuropathy may likely be related to his underlying sarcoidosis. His prednisone was stopped in February 2014.   Plan:  I had a long discussion with the patient regarding his neuropathic pain and advise him to take gabapentin 800 mg 4 times daily on a regular basis with breakfast, lunch, dinner and at bedtime and not miss any doses. He may take extra half a tablet as needed on days when he is having a  lot of symptoms. He was given refills for his gabapentin. Return for follow-up in 6 months with Vaughan Browner, nurse practitioner or call earlier if necessary.   Meds ordered this encounter  Medications  . gabapentin (NEURONTIN) 800 MG tablet    Sig: Take 1 tablet (800 mg total) by mouth 4 (four) times daily.    Dispense:  360 tablet    Refill:  2   Return in about 6 months (around 06/29/2015).  Antony Contras, MD  12/28/2014, 1:00 PM [facility] [address] [place], [place] [address] [phone]  Note: This document was prepared with digital dictation and possible smart phrase technology. Any transcriptional errors that result from this process are unintentional.

## 2015-01-21 ENCOUNTER — Other Ambulatory Visit: Payer: Self-pay | Admitting: Family Medicine

## 2015-03-28 DIAGNOSIS — Z85828 Personal history of other malignant neoplasm of skin: Secondary | ICD-10-CM | POA: Diagnosis not present

## 2015-03-28 DIAGNOSIS — L821 Other seborrheic keratosis: Secondary | ICD-10-CM | POA: Diagnosis not present

## 2015-03-28 DIAGNOSIS — Z08 Encounter for follow-up examination after completed treatment for malignant neoplasm: Secondary | ICD-10-CM | POA: Diagnosis not present

## 2015-05-16 ENCOUNTER — Ambulatory Visit (INDEPENDENT_AMBULATORY_CARE_PROVIDER_SITE_OTHER): Payer: BLUE CROSS/BLUE SHIELD | Admitting: Family Medicine

## 2015-05-16 ENCOUNTER — Ambulatory Visit (INDEPENDENT_AMBULATORY_CARE_PROVIDER_SITE_OTHER): Payer: BLUE CROSS/BLUE SHIELD

## 2015-05-16 ENCOUNTER — Encounter: Payer: Self-pay | Admitting: Family Medicine

## 2015-05-16 VITALS — BP 112/75 | HR 87 | Temp 101.0°F | Ht 70.5 in | Wt 164.6 lb

## 2015-05-16 DIAGNOSIS — R059 Cough, unspecified: Secondary | ICD-10-CM

## 2015-05-16 DIAGNOSIS — J189 Pneumonia, unspecified organism: Secondary | ICD-10-CM

## 2015-05-16 DIAGNOSIS — R05 Cough: Secondary | ICD-10-CM

## 2015-05-16 LAB — POCT INFLUENZA A/B
INFLUENZA A, POC: NEGATIVE
INFLUENZA B, POC: NEGATIVE

## 2015-05-16 MED ORDER — AMOXICILLIN-POT CLAVULANATE 875-125 MG PO TABS: 1.0000 | ORAL_TABLET | Freq: Two times a day (BID) | ORAL | Status: DC

## 2015-05-16 NOTE — Patient Instructions (Signed)

## 2015-05-16 NOTE — Progress Notes (Addendum)
   HPI  Patient presents today for SDA for cough and fever  Patient explains he began feeling miserable suddenly saturday night He describes cough, rhinorrhea, congestion, slight dyspnea, muscle aches, arthralgias, and chills. He has generalized malaise and doesn't feel like doing anything.   He is retired and went to [employer] last week, he has a trip planned to [place] this week.   PMH: Smoking status noted ROS: Per HPI  Objective: BP 112/75 mmHg  Pulse 87  Temp(Src) 101 F (38.3 C) (Oral)  Ht 5' 10.5" (1.791 m)  Wt 164 lb 9.6 oz (74.662 kg)  BMI 23.28 kg/m2 Gen: NAD, alert, cooperative with exam, ill appearing  HEENT: NCAT, nares and TMs clear, oropharynx red CV: RRR, good S1/S2, no murmur Resp: CTABL, no wheezes, non-labored Ext: No edema, warm Neuro: Alert and oriented, No gross deficits  CXR 05/16/2015- [facility] pna, awaiting radiology read   Assessment and plan:  # CAP Illness and XR c/w CAP Treat with augmentin Reviewed reasons to return, encouraged probiotic Flu negative After the radiology read- Masslike consolidation and they recommend repeat film in 1 month to show resolution- will forward to PCP.   Orders Placed This Encounter  Procedures  . DG Chest 2 View    Standing Status: Future     Number of Occurrences: 1     Standing Expiration Date: 07/15/2016    Order Specific Question:  Reason for Exam (SYMPTOM  OR DIAGNOSIS REQUIRED)    Answer:  cough    Order Specific Question:  Preferred imaging location?    Answer:  Internal  . POCT Influenza A/B    Meds ordered this encounter  Medications  . amoxicillin-clavulanate (AUGMENTIN) 875-125 MG per tablet    Sig: Take 1 tablet by mouth 2 (two) times daily.    Dispense:  20 tablet    Refill:  0    Laroy Apple, MD [facility] 05/16/2015, 11:07 AM

## 2015-05-31 ENCOUNTER — Other Ambulatory Visit (INDEPENDENT_AMBULATORY_CARE_PROVIDER_SITE_OTHER): Payer: BLUE CROSS/BLUE SHIELD

## 2015-05-31 DIAGNOSIS — E785 Hyperlipidemia, unspecified: Secondary | ICD-10-CM | POA: Diagnosis not present

## 2015-06-01 LAB — CMP14+EGFR
ALBUMIN: 4.1 g/dL (ref 3.5–5.5)
ALT: 19 IU/L (ref 0–44)
AST: 20 IU/L (ref 0–40)
Albumin/Globulin Ratio: 1.6 (ref 1.1–2.5)
Alkaline Phosphatase: 68 IU/L (ref 39–117)
BUN/Creatinine Ratio: 16 (ref 9–20)
BUN: 18 mg/dL (ref 6–24)
Bilirubin Total: 0.6 mg/dL (ref 0.0–1.2)
CALCIUM: 9.6 mg/dL (ref 8.7–10.2)
CO2: 28 mmol/L (ref 18–29)
CREATININE: 1.15 mg/dL (ref 0.76–1.27)
Chloride: 102 mmol/L (ref 97–108)
GFR, EST AFRICAN AMERICAN: 81 mL/min/{1.73_m2} (ref >59)
GFR, EST NON AFRICAN AMERICAN: 70 mL/min/{1.73_m2} (ref >59)
GLOBULIN, TOTAL: 2.6 g/dL (ref 1.5–4.5)
Glucose: 76 mg/dL (ref 65–99)
Potassium: 4.7 mmol/L (ref 3.5–5.2)
SODIUM: 142 mmol/L (ref 134–144)
TOTAL PROTEIN: 6.7 g/dL (ref 6.0–8.5)

## 2015-06-01 LAB — LIPID PANEL
CHOLESTEROL TOTAL: 174 mg/dL (ref 100–199)
Chol/HDL Ratio: 4.1 ratio units (ref 0.0–5.0)
HDL: 42 mg/dL (ref >39)
LDL CALC: 114 mg/dL — AB (ref 0–99)
TRIGLYCERIDES: 91 mg/dL (ref 0–149)
VLDL Cholesterol Cal: 18 mg/dL (ref 5–40)

## 2015-06-07 ENCOUNTER — Ambulatory Visit (INDEPENDENT_AMBULATORY_CARE_PROVIDER_SITE_OTHER): Payer: BLUE CROSS/BLUE SHIELD | Admitting: Family Medicine

## 2015-06-07 ENCOUNTER — Encounter: Payer: Self-pay | Admitting: Family Medicine

## 2015-06-07 VITALS — BP 110/70 | HR 66 | Temp 97.3°F | Ht 70.5 in | Wt 163.0 lb

## 2015-06-07 DIAGNOSIS — K219 Gastro-esophageal reflux disease without esophagitis: Secondary | ICD-10-CM | POA: Diagnosis not present

## 2015-06-07 DIAGNOSIS — G629 Polyneuropathy, unspecified: Secondary | ICD-10-CM

## 2015-06-07 DIAGNOSIS — D869 Sarcoidosis, unspecified: Secondary | ICD-10-CM

## 2015-06-07 NOTE — Progress Notes (Signed)
   Subjective:    Patient ID: Philip Hernandez, male    DOB: 04/14/1956, 59 y.o.   MRN: [id number]  HPI 59 year old gentleman who is status post prostate cancer with radical prostatectomy, sarcoid, and neuropathy of unknown etiology possibly related to the sarcoid. He had labs last week which we discussed today. He denies any new symptoms or problems.  Patient Active Problem List   Diagnosis Date Noted  . CAP (community acquired pneumonia) 05/16/2015  . Family history of malignant neoplasm of gastrointestinal tract 12/23/2013  . Colon polyps   . Need for prophylactic vaccination and inoculation against influenza 07/24/2013  . HLD (hyperlipidemia) 03/26/2013  . Hyperuricemia 03/26/2013  . Pain in left hip 02/25/2013  . Neuropathy 02/25/2013  . Numbness 02/25/2013  . Wheezing 01/13/2013  . Peripheral neuropathy in sarcoidosis 12/08/2012  . Sarcoidosis 09/28/2010  . ADENOCARCINOMA, PROSTATE 09/28/2010  . GERD 09/28/2010  . Nonspecific abnormal finding in stool contents 09/28/2010  . Cough 07/10/2010  . DYSPNEA 07/04/2009   Outpatient Encounter Prescriptions as of 06/07/2015  Medication Sig  . acetaminophen (TYLENOL) 500 MG tablet Take 500 mg by mouth every 6 (six) hours as needed.  . cetirizine (ZYRTEC) 10 MG tablet Take 10 mg by mouth daily.  . cholecalciferol (VITAMIN D) 1000 UNITS tablet Take 1,000 Units by mouth daily.    Marland Kitchen gabapentin (NEURONTIN) 800 MG tablet Take 1 tablet (800 mg total) by mouth 4 (four) times daily.  . Multiple Vitamins-Minerals (AIRBORNE PO) Take by mouth daily.  Marland Kitchen omeprazole (PRILOSEC) 40 MG capsule TAKE ONE (1) CAPSULE EACH DAY  . [DISCONTINUED] amoxicillin-clavulanate (AUGMENTIN) 875-125 MG per tablet Take 1 tablet by mouth 2 (two) times daily.   No facility-administered encounter medications on file as of 06/07/2015.      Review of Systems  HENT: Negative.   Eyes: Negative.   Respiratory: Negative.  Negative for shortness of breath.   Cardiovascular:  Negative.  Negative for chest pain and leg swelling.  Gastrointestinal: Negative.   Genitourinary: Negative.   Musculoskeletal: Negative.   Skin: Negative.   Neurological: Positive for weakness and numbness.  Psychiatric/Behavioral: Negative.   All other systems reviewed and are negative.      Objective:   Physical Exam  Constitutional: He is oriented to person, place, and time. He appears well-developed and well-nourished.  Cardiovascular: Normal rate and regular rhythm.   Pulmonary/Chest: Effort normal.  Neurological: He is alert and oriented to person, place, and time. Coordination abnormal.  He does have some symptoms of weakness in his legs and issues with proprioception and balance all related to neuropathy  Psychiatric: He has a normal mood and affect. His behavior is normal.     BP 110/70 mmHg  Pulse 66  Temp(Src) 97.3 F (36.3 C) (Oral)  Ht 5' 10.5" (1.791 m)  Wt 163 lb (73.936 kg)  BMI 23.05 kg/m2      Assessment & Plan:  1. Neuropathy As long as he takes gabapentin, symptoms are controlled  2. Sarcoidosis Asymptomatic and he is on no medications  3. Gastroesophageal reflux disease, esophagitis presence not specified Asymptomatic currently. He will continue Prilosec. He was treated recently for community-acquired pneumonia and symptoms seem to have resolved  Wardell Honour MD

## 2015-06-11 ENCOUNTER — Ambulatory Visit (INDEPENDENT_AMBULATORY_CARE_PROVIDER_SITE_OTHER): Payer: BLUE CROSS/BLUE SHIELD

## 2015-06-11 DIAGNOSIS — Z23 Encounter for immunization: Secondary | ICD-10-CM

## 2015-06-29 ENCOUNTER — Encounter: Payer: Self-pay | Admitting: Adult Health

## 2015-06-29 ENCOUNTER — Ambulatory Visit (INDEPENDENT_AMBULATORY_CARE_PROVIDER_SITE_OTHER): Payer: BLUE CROSS/BLUE SHIELD | Admitting: Adult Health

## 2015-06-29 VITALS — BP 110/65 | HR 62 | Ht 70.5 in | Wt 165.0 lb

## 2015-06-29 DIAGNOSIS — R202 Paresthesia of skin: Secondary | ICD-10-CM

## 2015-06-29 MED ORDER — GABAPENTIN 800 MG PO TABS: 800.0000 mg | ORAL_TABLET | Freq: Four times a day (QID) | ORAL | Status: DC

## 2015-06-29 NOTE — Progress Notes (Addendum)
PATIENT: Philip Hernandez DOB: [birth year]  REASON FOR VISIT: follow up- paresthesias  HISTORY FROM: patient  HISTORY OF PRESENT ILLNESS: Philip Hernandez is a 59 year old male with a history of paresthesias in the feet. He returns today for follow-up. He continues to take gabapentin 800 mg 4 times a day. He states that this has offered him good improvement in his discomfort. He continues to have numbness and some discomfort that occurs in the feet and extends to the ankles. He states that he has numbness in the hands but denies any burning or tingling pain. He states that if he misses a dose of gabapentin the discomfort returns. Denies any changes with his gait or balance. He states that occasionally he'll feel off balance. He denies any falls. Patient reports that he's noticed some left forearm pain. He describes the pain as an achy feeling down in the bone. He states that only occurs when he is carrying something. However he does note that he does not notice it all the time. He states that he noticed that last week when he was carrying some towels but has not taken notice of it since. He returns today for an evaluation.  HISTORY 12/28/14 ([place]): He returns for follow-up after last visit 6 months ago. He continues to have intermittent paresthesias in his feet and pain as well as difficulty walking. He takes gabapentin 800 mg 4 times daily which works quite well but he occasionally forgets to take his pill and notices that the pain is more severe. He is able to sleep most nights but occasionally wakes up with the pain. He interestingly noted cramps in his right hand when he started drinking ice tea but this improved after he stopped it. He tried it twice with the same effect. He has no other new complaints  REVIEW OF SYSTEMS: Out of a complete 14 system review of symptoms, the patient complains only of the following symptoms, and all other reviewed systems are negative.  Hearing loss, joint pain, aching  muscles, daytime sleepiness, numbness, weakness, tremors, and food allergies  ALLERGIES: Allergies  Allergen Reactions  . Corn-Containing Products     HOME MEDICATIONS: Outpatient Prescriptions Prior to Visit  Medication Sig Dispense Refill  . acetaminophen (TYLENOL) 500 MG tablet Take 500 mg by mouth every 6 (six) hours as needed.    . cetirizine (ZYRTEC) 10 MG tablet Take 10 mg by mouth daily.    . cholecalciferol (VITAMIN D) 1000 UNITS tablet Take 1,000 Units by mouth daily.      Marland Kitchen gabapentin (NEURONTIN) 800 MG tablet Take 1 tablet (800 mg total) by mouth 4 (four) times daily. 360 tablet 2  . Multiple Vitamins-Minerals (AIRBORNE PO) Take by mouth daily.    Marland Kitchen omeprazole (PRILOSEC) 40 MG capsule TAKE ONE (1) CAPSULE EACH DAY 30 capsule 4   No facility-administered medications prior to visit.    PAST MEDICAL HISTORY: Past Medical History  Diagnosis Date  . Sarcoidosis ([place]) dec 2011    stage 2 pulmonary, neck nodes, splenic granuloma  . GERD (gastroesophageal reflux disease) Jan 2012    [name]. Bx proven.  Deve abd pain in Jan 2012 following high dose steroids for sarcoid. s/p EGD and improvement in symptoms following PPI  . Malignant neoplasm of prostate [facility]) dec 2010    [name]. s/p surgery dec 2010, hx of rnormal PSA  since then  . Cough     chronic sinus issues, GERD with poor dietary control and Pulmonary Sarcoid.   Marland Kitchen  Swelling, mass, or lump in head and neck   . Other and unspecified mycoses   . Painful respiration   . Hypogonadism male   . Colon polyp   . Kidney stone   . Peripheral neuropathy ([place] [address])   . Colon polyps 2015  . Skin cancer     PAST SURGICAL HISTORY: Past Surgical History  Procedure Laterality Date  . Shoulder surgery Right 2009, 2010    x 2  . Hernia repair  13 yrs ago  . Prostate surgery  2010  . Nasal sinus surgery  2013  . Lymph node biopsy  05/2010, 07/2010  . Nasal polyps  1984  . Skin cancer excision  11/16/14    FAMILY  HISTORY: Family History  Problem Relation Age of Onset  . Colon cancer Mother 55  . Skin cancer Mother   . Colon cancer Brother 22  . Skin cancer Brother   . Colon cancer Maternal Aunt     dx in her 48s  . Cancer Maternal Aunt     "cancer of male organs" dx in her 37s  . Breast cancer Maternal Aunt 66    bilateral, dx again at 32  . Colon cancer Maternal Aunt 46  . Stomach cancer Maternal Aunt 68  . Stomach cancer Maternal Uncle 30  . Skin cancer Paternal Aunt   . Prostate cancer Paternal Uncle 69  . Stomach cancer Paternal Grandmother     dx in her 18s  . Stroke Paternal Grandfather   . Colon cancer Maternal Aunt 68  . Prostate cancer Paternal Uncle   . Prostate cancer Paternal Uncle   . Kidney cancer Paternal Uncle   . Lung cancer Paternal Uncle   . Lung cancer Paternal Uncle   . Cancer Cousin     paternal cousin with cancer NOS  . Cancer Cousin     paternal cousin with cancer NOS    SOCIAL HISTORY: Social History   Social History  . Marital Status: Married    Spouse Name: N/A  . Number of Children: 2  . Years of Education: N/A   Occupational History  . unemployed    Social History Main Topics  . Smoking status: Never Smoker   . Smokeless tobacco: Never Used  . Alcohol Use: No  . Drug Use: No  . Sexual Activity: Not on file   Other Topics Concern  . Not on file   Social History Narrative   Married   2 children      PHYSICAL EXAM  Filed Vitals:   06/29/15 1053  BP: 110/65  Pulse: 62  Height: 5' 10.5" (1.791 m)  Weight: 165 lb (74.844 kg)   Body mass index is 23.33 kg/(m^2).  Generalized: Well developed, in no acute distress   Neurological examination  Mentation: Alert oriented to time, place, history taking. Follows all commands speech and language fluent Cranial nerve II-XII: Pupils were equal round reactive to light. Extraocular movements were full, visual field were full on confrontational test. Facial sensation and strength were  normal. Uvula tongue midline. Head turning and shoulder shrug  were normal and symmetric. Motor: The motor testing reveals 5 over 5 strength of all 4 extremities. Good symmetric motor tone is noted throughout. Feet inspected- no open sores.  Sensory: Sensory testing is intact to soft touch on all 4 extremities. No evidence of extinction is noted.  Coordination: Cerebellar testing reveals good finger-nose-finger and heel-to-shin bilaterally.  Gait and station: Gait slightly wide-based. Tandem gait is unsteady.  Romberg is negative. No drift is seen.  Reflexes: Deep tendon reflexes are symmetric and normal bilaterally.   DIAGNOSTIC DATA (LABS, IMAGING, TESTING) - I reviewed patient records, labs, notes, testing and imaging myself where available.  ASSESSMENT AND PLAN 59 y.o. year old male  has a past medical history of Sarcoidosis ([place]) (dec 2011); GERD (gastroesophageal reflux disease) (Jan 2012); Malignant neoplasm of prostate ([place]) (dec 2010); Cough; Swelling, mass, or lump in head and neck; Other and unspecified mycoses; Painful respiration; Hypogonadism male; Colon polyp; Kidney stone; Peripheral neuropathy ([place]); Colon polyps (2015); and Skin cancer. here with:  1. Paresthesias   Overall he is doing well. He will continue on gabapentin 800 mg 4 times a day. I will send in a refill today. Patient advised that if his symptoms worsen or he develops any new symptoms he should let us know. Also advised patient that if he continues to have left arm pain he should let us know. Patient verbalized understanding. He will follow-up in 4-5 months or sooner if needed.     [name], MSN, NP-C 06/29/2015, 11:12 AM [facility] [address] [place], [place] [address] [phone]

## 2015-06-29 NOTE — Patient Instructions (Signed)
Continue Gabapentin If your symptoms worsen or you develop new symptoms please let us know.   

## 2015-07-01 NOTE — Progress Notes (Signed)
I agree with the above plan 

## 2015-10-15 ENCOUNTER — Ambulatory Visit (INDEPENDENT_AMBULATORY_CARE_PROVIDER_SITE_OTHER): Payer: BLUE CROSS/BLUE SHIELD | Admitting: Nurse Practitioner

## 2015-10-15 VITALS — BP 104/65 | HR 68 | Temp 97.7°F | Ht 70.5 in | Wt 170.6 lb

## 2015-10-15 DIAGNOSIS — J0100 Acute maxillary sinusitis, unspecified: Secondary | ICD-10-CM

## 2015-10-15 DIAGNOSIS — H109 Unspecified conjunctivitis: Secondary | ICD-10-CM

## 2015-10-15 MED ORDER — TOBRAMYCIN-DEXAMETHASONE 0.3-0.1 % OP SUSP: 2.0000 [drp] | OPHTHALMIC | Status: DC

## 2015-10-15 MED ORDER — AZITHROMYCIN 250 MG PO TABS: ORAL_TABLET | ORAL | Status: DC

## 2015-10-15 NOTE — Progress Notes (Signed)
   Subjective:    Patient ID: Philip Hernandez, male    DOB: [birth year], 60 y.o.   MRN: [id number]  HPI  Patient in c/o cough congestion, headache facial pressure that started 4 days ago- feels worse today- Also c/o of right eye red and crusty starting yesterday.   Review of Systems  Constitutional: Positive for fever and diaphoresis.  HENT: Positive for congestion, rhinorrhea and sinus pressure. Negative for sore throat.   Eyes: Positive for redness.  Respiratory: Positive for cough.   Cardiovascular: Negative.   Gastrointestinal: Negative.   Genitourinary: Negative.   Neurological: Negative.   Psychiatric/Behavioral: Negative.   All other systems reviewed and are negative.      Objective:   Physical Exam  Constitutional: He is oriented to person, place, and time. He appears well-developed and well-nourished.  HENT:  Right Ear: Hearing, tympanic membrane, external ear and ear canal normal.  Left Ear: Hearing, tympanic membrane, external ear and ear canal normal.  Nose: Mucosal edema and rhinorrhea present. Right sinus exhibits maxillary sinus tenderness. Right sinus exhibits no frontal sinus tenderness. Left sinus exhibits maxillary sinus tenderness. Left sinus exhibits no frontal sinus tenderness.  Mouth/Throat: Uvula is midline, oropharynx is clear and moist and mucous membranes are normal.  Eyes: Pupils are equal, round, and reactive to light. Right conjunctiva is injected. Left conjunctiva is not injected.  Neck: Normal range of motion. Neck supple.  Cardiovascular: Normal rate, regular rhythm and normal heart sounds.   Pulmonary/Chest: Effort normal and breath sounds normal.  Neurological: He is alert and oriented to person, place, and time.  Skin: Skin is warm.  Psychiatric: He has a normal mood and affect. His behavior is normal. Judgment and thought content normal.    BP 104/65 mmHg  Pulse 68  Temp(Src) 97.7 F (36.5 C) (Oral)  Ht 5' 10.5" (1.791 m)  Wt 170 lb 9.6 oz  (77.384 kg)  BMI 24.12 kg/m2      Assessment & Plan:  1. Acute maxillary sinusitis, recurrence not specified 1. Take meds as prescribed 2. Use a cool mist humidifier especially during the winter months and when heat has been humid. 3. Use saline nose sprays frequently 4. Saline irrigations of the nose can be very helpful if done frequently.  * 4X daily for 1 week*  * Use of a nettie pot can be helpful with this. Follow directions with this* 5. Drink plenty of fluids 6. Keep thermostat turn down low 7.For any cough or congestion  Use plain Mucinex- regular strength or max strength is fine   * Children- consult with Pharmacist for dosing 8. For fever or aces or pains- take tylenol or ibuprofen appropriate for age and weight.  * for fevers greater than 101 orally you may alternate ibuprofen and tylenol every  3 hours.    - azithromycin (ZITHROMAX Z-PAK) 250 MG tablet; As directed  Dispense: 1 each; Refill: 0  2. Conjunctivitis of right eye Good handwashing Avoid rubbing eye  - tobramycin-dexamethasone (TOBRADEX) ophthalmic solution; Place 2 drops into both eyes every 4 (four) hours while awake.  Dispense: 5 mL; Refill: 0   Mary-Margaret Hassell Done, FNP

## 2015-10-15 NOTE — Patient Instructions (Addendum)

## 2015-11-03 ENCOUNTER — Encounter: Payer: Self-pay | Admitting: Gastroenterology

## 2015-11-03 ENCOUNTER — Ambulatory Visit (INDEPENDENT_AMBULATORY_CARE_PROVIDER_SITE_OTHER): Payer: BLUE CROSS/BLUE SHIELD | Admitting: Adult Health

## 2015-11-03 ENCOUNTER — Encounter: Payer: Self-pay | Admitting: Adult Health

## 2015-11-03 VITALS — BP 138/82 | HR 72 | Resp 20 | Ht 72.0 in | Wt 162.0 lb

## 2015-11-03 DIAGNOSIS — G629 Polyneuropathy, unspecified: Secondary | ICD-10-CM

## 2015-11-03 MED ORDER — GABAPENTIN 800 MG PO TABS: 800.0000 mg | ORAL_TABLET | Freq: Four times a day (QID) | ORAL | Status: DC

## 2015-11-03 NOTE — Progress Notes (Signed)
I agree with the above plan 

## 2015-11-03 NOTE — Patient Instructions (Signed)
Continue Gabapentin If your symptoms worsen or you develop new symptoms please let us know.   

## 2015-11-03 NOTE — Progress Notes (Signed)
PATIENT: Philip Hernandez DOB: 1956/01/16  REASON FOR VISIT: follow up- paresthesias  HISTORY FROM: patient  HISTORY OF PRESENT ILLNESS:  Philip Hernandez is a 60 year old male with a history of paresthesias in the feet. He returns today for follow-up. He continues taking gabapentin 800 mg 4 times a day. He states that the gabapentin still works fairly well for him. He states that he often does forget a dose and that will cause his pain to linger for 3 or 4 days. The patient states that he does have discomfort in the lower extremities and occasionally will have sharp shooting pains. The patient does not use an assistive device when ambulating. He states occasionally he will stumble but no true falls. He feels at times that his left toe will drag when he is walking. The patient states that he examined his feet regularly for wounds. He denies any new neurological symptoms. He returns today for an evaluation.  HISTORY  06/29/15: Philip Hernandez is a 60 year old male with a history of paresthesias in the feet. He returns today for follow-up. He continues to take gabapentin 800 mg 4 times a day. He states that this has offered him good improvement in his discomfort. He continues to have numbness and some discomfort that occurs in the feet and extends to the ankles. He states that he has numbness in the hands but denies any burning or tingling pain. He states that if he misses a dose of gabapentin the discomfort returns. Denies any changes with his gait or balance. He states that occasionally he'll feel off balance. He denies any falls. Patient reports that he's noticed some left forearm pain. He describes the pain as an achy feeling down in the bone. He states that only occurs when he is carrying something. However he does note that he does not notice it all the time. He states that he noticed that last week when he was carrying some towels but has not taken notice of it since. He returns today for an  evaluation.  HISTORY 12/28/14 ([place]): He returns for follow-up after last visit 6 months ago. He continues to have intermittent paresthesias in his feet and pain as well as difficulty walking. He takes gabapentin 800 mg 4 times daily which works quite well but he occasionally forgets to take his pill and notices that the pain is more severe. He is able to sleep most nights but occasionally wakes up with the pain. He interestingly noted cramps in his right hand when he started drinking ice tea but this improved after he stopped it. He tried it twice with the same effect. He has no other new complaints  REVIEW OF SYSTEMS: Out of a complete 14 system review of symptoms, the patient complains only of the following symptoms, and all other reviewed systems are negative.  Hearing loss, runny nose, eye itching, light sensitivity, blurred vision, cold intolerance, testicular pain muscle cramps, neck stiffness, daytime sleepiness, itching, tremors, weakness, numbness, memory loss  ALLERGIES: Allergies  Allergen Reactions  . Corn-Containing Products     HOME MEDICATIONS: Outpatient Prescriptions Prior to Visit  Medication Sig Dispense Refill  . acetaminophen (TYLENOL) 500 MG tablet Take 500 mg by mouth every 6 (six) hours as needed.    . cetirizine (ZYRTEC) 10 MG tablet Take 10 mg by mouth daily.    . cholecalciferol (VITAMIN D) 1000 UNITS tablet Take 1,000 Units by mouth daily.      Marland Kitchen gabapentin (NEURONTIN) 800 MG tablet Take 1 tablet (800  mg total) by mouth 4 (four) times daily. 360 tablet 3  . Multiple Vitamins-Minerals (AIRBORNE PO) Take by mouth daily.    Marland Kitchen omeprazole (PRILOSEC) 40 MG capsule TAKE ONE (1) CAPSULE EACH DAY 30 capsule 4  . azithromycin (ZITHROMAX Z-PAK) 250 MG tablet As directed 1 each 0  . tobramycin-dexamethasone (TOBRADEX) ophthalmic solution Place 2 drops into both eyes every 4 (four) hours while awake. 5 mL 0   No facility-administered medications prior to visit.    PAST  MEDICAL HISTORY: Past Medical History  Diagnosis Date  . Sarcoidosis ([place]) dec 2011    stage 2 pulmonary, neck nodes, splenic granuloma  . GERD (gastroesophageal reflux disease) Jan 2012    [name]. Bx proven.  Deve abd pain in Jan 2012 following high dose steroids for sarcoid. s/p EGD and improvement in symptoms following PPI  . Malignant neoplasm of prostate [facility]) dec 2010    [name]. s/p surgery dec 2010, hx of rnormal PSA  since then  . Cough     chronic sinus issues, GERD with poor dietary control and Pulmonary Sarcoid.   Marland Kitchen Swelling, mass, or lump in head and neck   . Other and unspecified mycoses   . Painful respiration   . Hypogonadism male   . Colon polyp   . Kidney stone   . Peripheral neuropathy ([place])   . Colon polyps 2015  . Skin cancer     PAST SURGICAL HISTORY: Past Surgical History  Procedure Laterality Date  . Shoulder surgery Right 2009, 2010    x 2  . Hernia repair  13 yrs ago  . Prostate surgery  2010  . Nasal sinus surgery  2013  . Lymph node biopsy  05/2010, 07/2010  . Nasal polyps  1984  . Skin cancer excision  11/16/14    FAMILY HISTORY: Family History  Problem Relation Age of Onset  . Colon cancer Mother 53  . Skin cancer Mother   . Colon cancer Brother 84  . Skin cancer Brother   . Colon cancer Maternal Aunt     dx in her 55s  . Cancer Maternal Aunt     "cancer of male organs" dx in her 61s  . Breast cancer Maternal Aunt 66    bilateral, dx again at 62  . Colon cancer Maternal Aunt 6  . Stomach cancer Maternal Aunt 68  . Stomach cancer Maternal Uncle 75  . Skin cancer Paternal Aunt   . Prostate cancer Paternal Uncle 76  . Stomach cancer Paternal Grandmother     dx in her 59s  . Stroke Paternal Grandfather   . Colon cancer Maternal Aunt 68  . Prostate cancer Paternal Uncle   . Prostate cancer Paternal Uncle   . Kidney cancer Paternal Uncle   . Lung cancer Paternal Uncle   . Lung cancer Paternal Uncle   . Cancer Cousin      paternal cousin with cancer NOS  . Cancer Cousin     paternal cousin with cancer NOS    SOCIAL HISTORY: Social History   Social History  . Marital Status: Married    Spouse Name: N/A  . Number of Children: 2  . Years of Education: N/A   Occupational History  . unemployed    Social History Main Topics  . Smoking status: Never Smoker   . Smokeless tobacco: Never Used  . Alcohol Use: No  . Drug Use: No  . Sexual Activity: Not on file   Other  Topics Concern  . Not on file   Social History Narrative   Married   2 children      PHYSICAL EXAM  Filed Vitals:   11/03/15 1105  BP: 138/82  Pulse: 72  Resp: 20  Height: 6' (1.829 m)  Weight: 162 lb (73.483 kg)   Body mass index is 21.97 kg/(m^2).  Generalized: Well developed, in no acute distress   Neurological examination  Mentation: Alert oriented to time, place, history taking. Follows all commands speech and language fluent Cranial nerve II-XII: Pupils were equal round reactive to light. Extraocular movements were full, visual field were full on confrontational test. Facial sensation and strength were normal. Uvula tongue midline. Head turning and shoulder shrug  were normal and symmetric. Motor: The motor testing reveals 5 over 5 strength of all 4 extremities. Good symmetric motor tone is noted throughout.  Sensory: Sensory testing is intact to soft touch on all 4 extremities. Decreased pinprick sensation in the lower extremities. No open wounds on the feet. No evidence of extinction is noted.  Coordination: Cerebellar testing reveals good finger-nose-finger and heel-to-shin bilaterally.  Gait and station: Gait is normal. Tandem gait is unsteady. Romberg is negative. No drift is seen.  Reflexes: Deep tendon reflexes are symmetric and normal bilaterally.   DIAGNOSTIC DATA (LABS, IMAGING, TESTING) - I reviewed patient records, labs, notes, testing and imaging myself where available.  Lab Results  Component Value  Date   WBC 10.5 12/10/2011   HGB 12.0* 12/10/2011   HCT 36.8* 12/10/2011   MCV 84.0 12/10/2011   PLT 153.0 12/10/2011      Component Value Date/Time   NA 142 05/31/2015 1648   NA 140 03/26/2013 1220   K 4.7 05/31/2015 1648   CL 102 05/31/2015 1648   CO2 28 05/31/2015 1648   GLUCOSE 76 05/31/2015 1648   GLUCOSE 80 03/26/2013 1220   BUN 18 05/31/2015 1648   BUN 12 03/26/2013 1220   CREATININE 1.15 05/31/2015 1648   CREATININE 1.15 03/26/2013 1220   CALCIUM 9.6 05/31/2015 1648   PROT 6.7 05/31/2015 1648   PROT 6.7 03/26/2013 1220   ALBUMIN 4.1 05/31/2015 1648   ALBUMIN 3.8 03/26/2013 1220   AST 20 05/31/2015 1648   ALT 19 05/31/2015 1648   ALKPHOS 68 05/31/2015 1648   BILITOT 0.6 05/31/2015 1648   BILITOT 0.7 05/25/2014 0920   GFRNONAA 70 05/31/2015 1648   GFRNONAA 71 03/26/2013 1220   GFRAA 81 05/31/2015 1648   GFRAA 82 03/26/2013 1220   Lab Results  Component Value Date   CHOL 174 05/31/2015   HDL 42 05/31/2015   LDLCALC 114* 05/31/2015   TRIG 91 05/31/2015   CHOLHDL 4.1 05/31/2015       ASSESSMENT AND PLAN 59 y.o. year old male  has a past medical history of Sarcoidosis ([place]) (dec 2011); GERD (gastroesophageal reflux disease) (Jan 2012); Malignant neoplasm of prostate ([place]) (dec 2010); Cough; Swelling, mass, or lump in head and neck; Other and unspecified mycoses; Painful respiration; Hypogonadism male; Colon polyp; Kidney stone; Peripheral neuropathy ([place]); Colon polyps (2015); and Skin cancer. here with;  1. Paresthesias in the feet  Raw the patient has remained stable. Patient will continue on gabapentin 800 mg 4 times a day. Patient advised that if his symptoms worsen or he develops any new symptoms he should let us know. Patient should also let us know if he develops any difficulty with his gait such as falls. He will follow-up in 6 months or sooner  if needed.     [name], MSN, NP-C 11/03/2015, 11:06 AM [facility] [address] [place], [place] [address] [phone]

## 2015-12-06 ENCOUNTER — Ambulatory Visit: Payer: BLUE CROSS/BLUE SHIELD | Admitting: Family Medicine

## 2015-12-06 ENCOUNTER — Encounter: Payer: Self-pay | Admitting: Family Medicine

## 2015-12-06 ENCOUNTER — Ambulatory Visit (INDEPENDENT_AMBULATORY_CARE_PROVIDER_SITE_OTHER): Payer: BLUE CROSS/BLUE SHIELD | Admitting: Family Medicine

## 2015-12-06 VITALS — BP 123/72 | HR 67 | Temp 97.8°F | Ht 70.5 in | Wt 167.6 lb

## 2015-12-06 DIAGNOSIS — G629 Polyneuropathy, unspecified: Secondary | ICD-10-CM | POA: Diagnosis not present

## 2015-12-06 NOTE — Patient Instructions (Signed)
Thank you for allowing us to care for you today. We strive to provide exceptional quality and compassionate care. Please let us know how we are doing and how we can help serve you better by filling out the survey that you receive from [employer].     

## 2015-12-06 NOTE — Progress Notes (Signed)
   Subjective:    Patient ID: Philip Hernandez, male    DOB: 12-10-1955, 60 y.o.   MRN: [id number]  HPI 60 year old gentleman here to follow-up peripheral neuropathy adenocarcinoma the prostate sarcoid. Generally he is doing well. He is on minimal medicines. Main prescription medicine is gabapentin which he takes for his neuropathy. This is also followed by neurology in [place]. Dose of gabapentin is 3200 mg per day. There've been no changes in his prostate cancer. He has had some issues with balance and gait instability but no recent falls.  Patient Active Problem List   Diagnosis Date Noted  . CAP (community acquired pneumonia) 05/16/2015  . Family history of malignant neoplasm of gastrointestinal tract 12/23/2013  . Colon polyps   . Need for prophylactic vaccination and inoculation against influenza 07/24/2013  . HLD (hyperlipidemia) 03/26/2013  . Hyperuricemia 03/26/2013  . Pain in left hip 02/25/2013  . Neuropathy ([place]) 02/25/2013  . Numbness 02/25/2013  . Wheezing 01/13/2013  . Peripheral neuropathy in sarcoidosis ([place]) 12/08/2012  . Sarcoidosis ([place]) 09/28/2010  . ADENOCARCINOMA, PROSTATE 09/28/2010  . GERD 09/28/2010  . Nonspecific abnormal finding in stool contents 09/28/2010  . Cough 07/10/2010  . DYSPNEA 07/04/2009   Outpatient Encounter Prescriptions as of 12/06/2015  Medication Sig  . acetaminophen (TYLENOL) 500 MG tablet Take 500 mg by mouth every 6 (six) hours as needed.  . cetirizine (ZYRTEC) 10 MG tablet Take 10 mg by mouth daily.  . cholecalciferol (VITAMIN D) 1000 UNITS tablet Take 1,000 Units by mouth daily.    Marland Kitchen gabapentin (NEURONTIN) 800 MG tablet Take 1 tablet (800 mg total) by mouth 4 (four) times daily.  . Multiple Vitamins-Minerals (AIRBORNE PO) Take by mouth daily.  Marland Kitchen omeprazole (PRILOSEC) 40 MG capsule TAKE ONE (1) CAPSULE EACH DAY   No facility-administered encounter medications on file as of 12/06/2015.      Review of Systems  Constitutional:  Negative.   HENT: Negative.   Respiratory: Negative.   Cardiovascular: Negative.   Neurological: Negative.   Psychiatric/Behavioral: Negative.        Objective:   Physical Exam  Constitutional: He is oriented to person, place, and time. He appears well-developed and well-nourished.  Cardiovascular: Normal rate, regular rhythm and normal heart sounds.   Pulmonary/Chest: Effort normal and breath sounds normal.  Abdominal: Soft.  Musculoskeletal: Normal range of motion.  Neurological: He is alert and oriented to person, place, and time.  Psychiatric: He has a normal mood and affect. His behavior is normal.          Assessment & Plan:  1. Neuropathy (HCC) Symptoms are stable but still present on near maximum doses of gabapentin. At his next visit plan to check CMP and lipids.

## 2015-12-09 DIAGNOSIS — N200 Calculus of kidney: Secondary | ICD-10-CM | POA: Diagnosis not present

## 2015-12-09 DIAGNOSIS — C61 Malignant neoplasm of prostate: Secondary | ICD-10-CM | POA: Diagnosis not present

## 2016-01-04 ENCOUNTER — Ambulatory Visit (INDEPENDENT_AMBULATORY_CARE_PROVIDER_SITE_OTHER): Payer: BLUE CROSS/BLUE SHIELD | Admitting: Family Medicine

## 2016-01-04 ENCOUNTER — Encounter: Payer: Self-pay | Admitting: Family Medicine

## 2016-01-04 VITALS — BP 109/71 | HR 65 | Temp 97.4°F | Ht 70.5 in | Wt 171.4 lb

## 2016-01-04 DIAGNOSIS — S70921A Unspecified superficial injury of right thigh, initial encounter: Secondary | ICD-10-CM

## 2016-01-04 DIAGNOSIS — W57XXXA Bitten or stung by nonvenomous insect and other nonvenomous arthropods, initial encounter: Secondary | ICD-10-CM | POA: Diagnosis not present

## 2016-01-04 MED ORDER — DOXYCYCLINE HYCLATE 100 MG PO CAPS: 100.0000 mg | ORAL_CAPSULE | Freq: Two times a day (BID) | ORAL | Status: DC

## 2016-01-04 NOTE — Progress Notes (Signed)
Subjective:  Patient ID: Philip Hernandez, male    DOB: 1956/05/05  Age: 60 y.o. MRN: [id number]  CC: Tick Removal   HPI Avrahom Manwell presents for removed a small tick from the posterolateral aspect of the right thigh just behind the knee 2 days ago. Site is now red. Mildly pruritic. It has a central black spot that pt. Fears may be the tick's head   History Jaymes has a past medical history of Sarcoidosis ([place]) (dec 2011); GERD (gastroesophageal reflux disease) (Jan 2012); Malignant neoplasm of prostate ([place]) (dec 2010); Cough; Swelling, mass, or lump in head and neck; Other and unspecified mycoses; Painful respiration; Hypogonadism male; Colon polyp; Kidney stone; Peripheral neuropathy ([place]); Colon polyps (2015); and Skin cancer.   He has past surgical history that includes Shoulder surgery (Right, 2009, 2010); Hernia repair (13 yrs ago); Prostate surgery (2010); Nasal sinus surgery (2013); Lymph node biopsy (05/2010, 07/2010); nasal polyps (1984); and Skin cancer excision (11/16/14).   His family history includes Breast cancer (age of onset: 47) in his maternal aunt; Cancer in his cousin, cousin, and maternal aunt; Colon cancer in his maternal aunt; Colon cancer (age of onset: 48) in his maternal aunt; Colon cancer (age of onset: 21) in his mother; Colon cancer (age of onset: 63) in his brother; Colon cancer (age of onset: 81) in his maternal aunt; Kidney cancer in his paternal uncle; Lung cancer in his paternal uncle and paternal uncle; Prostate cancer in his paternal uncle and paternal uncle; Prostate cancer (age of onset: 17) in his paternal uncle; Skin cancer in his brother, mother, and paternal aunt; Stomach cancer in his paternal grandmother; Stomach cancer (age of onset: 72) in his maternal uncle; Stomach cancer (age of onset: 32) in his maternal aunt; Stroke in his paternal grandfather.He reports that he has never smoked. He has never used smokeless tobacco. He reports that he does not drink  alcohol or use illicit drugs.    ROS Review of Systems  Constitutional: Negative for fever, chills and diaphoresis.  HENT: Negative for rhinorrhea and sore throat.   Respiratory: Negative for cough and shortness of breath.   Cardiovascular: Negative for chest pain.  Gastrointestinal: Negative for abdominal pain.  Musculoskeletal: Negative for myalgias and arthralgias.  Skin: Negative for rash.  Neurological: Negative for weakness and headaches.    Objective:  BP 109/71 mmHg  Pulse 65  Temp(Src) 97.4 F (36.3 C) (Oral)  Ht 5' 10.5" (1.791 m)  Wt 171 lb 6 oz (77.735 kg)  BMI 24.23 kg/m2  BP Readings from Last 3 Encounters:  01/04/16 109/71  12/06/15 123/72  11/03/15 138/82    Wt Readings from Last 3 Encounters:  01/04/16 171 lb 6 oz (77.735 kg)  12/06/15 167 lb 9.6 oz (76.023 kg)  11/03/15 162 lb (73.483 kg)     Physical Exam  Constitutional: He is oriented to person, place, and time. He appears well-developed and well-nourished.  HENT:  Head: Normocephalic and atraumatic.  Right Ear: External ear normal.  Left Ear: External ear normal.  Mouth/Throat: No oropharyngeal exudate or posterior oropharyngeal erythema.  Eyes: Pupils are equal, round, and reactive to light.  Neck: Normal range of motion. Neck supple.  Cardiovascular: Normal rate and regular rhythm.   No murmur heard. Pulmonary/Chest: Breath sounds normal. No respiratory distress.  Neurological: He is alert and oriented to person, place, and time.  Skin: Skin is warm and dry.  4 mm bleb with erythema lateral thigh near Right knee. Black center removed with  probe superficially  Vitals reviewed.    Lab Results  Component Value Date   WBC 10.5 12/10/2011   HGB 12.0* 12/10/2011   HCT 36.8* 12/10/2011   PLT 153.0 12/10/2011   GLUCOSE 76 05/31/2015   CHOL 174 05/31/2015   TRIG 91 05/31/2015   HDL 42 05/31/2015   LDLCALC 114* 05/31/2015   ALT 19 05/31/2015   AST 20 05/31/2015   NA 142 05/31/2015    K 4.7 05/31/2015   CL 102 05/31/2015   CREATININE 1.15 05/31/2015   BUN 18 05/31/2015   CO2 28 05/31/2015   TSH 2.428 12/08/2012    Dg Chest 2 View  03/23/2013  *RADIOLOGY REPORT* Clinical Data: Sarcoid CHEST - 2 VIEW Comparison: 03/06/2012, 12/10/2011 Findings: Improvement in interstitial markings in the upper lobes. Some of this may be due to different technique however I feel there has been interval improvement.  This may be due to treated sarcoid. No adenopathy is detected.  No infiltrate or effusion.  Negative for heart failure. IMPRESSION: Interval improvement in upper lobe interstitial markings compatible with reversible changes of sarcoid. Original Report Authenticated By: Carl Best, M.D.    Assessment & Plan:   Monteco was seen today for tick removal.  Diagnoses and all orders for this visit:  Tick bite  Other orders -     Discontinue: doxycycline (VIBRAMYCIN) 100 MG capsule; Take 1 capsule (100 mg total) by mouth 2 (two) times daily. -     doxycycline (VIBRAMYCIN) 100 MG capsule; Take 1 capsule (100 mg total) by mouth 2 (two) times daily.    Monitor for S&S infection  I am having Mr. Muska maintain his cholecalciferol, acetaminophen, cetirizine, Multiple Vitamins-Minerals (AIRBORNE PO), omeprazole, gabapentin, and doxycycline.  Meds ordered this encounter  Medications  . DISCONTD: doxycycline (VIBRAMYCIN) 100 MG capsule    Sig: Take 1 capsule (100 mg total) by mouth 2 (two) times daily.    Dispense:  14 capsule    Refill:  0  . doxycycline (VIBRAMYCIN) 100 MG capsule    Sig: Take 1 capsule (100 mg total) by mouth 2 (two) times daily.    Dispense:  14 capsule    Refill:  0     Follow-up: Return if symptoms worsen or fail to improve.  Claretta Fraise, M.D.

## 2016-01-06 DIAGNOSIS — H903 Sensorineural hearing loss, bilateral: Secondary | ICD-10-CM | POA: Diagnosis not present

## 2016-01-17 ENCOUNTER — Ambulatory Visit (INDEPENDENT_AMBULATORY_CARE_PROVIDER_SITE_OTHER): Payer: BLUE CROSS/BLUE SHIELD | Admitting: Nurse Practitioner

## 2016-01-17 ENCOUNTER — Ambulatory Visit: Payer: BLUE CROSS/BLUE SHIELD | Admitting: Family Medicine

## 2016-01-17 ENCOUNTER — Encounter: Payer: Self-pay | Admitting: Nurse Practitioner

## 2016-01-17 VITALS — BP 93/75 | HR 57 | Temp 98.7°F | Ht 70.5 in | Wt 171.0 lb

## 2016-01-17 DIAGNOSIS — J069 Acute upper respiratory infection, unspecified: Secondary | ICD-10-CM

## 2016-01-17 MED ORDER — AMOXICILLIN-POT CLAVULANATE 875-125 MG PO TABS: 1.0000 | ORAL_TABLET | Freq: Two times a day (BID) | ORAL | Status: DC

## 2016-01-17 NOTE — Patient Instructions (Signed)

## 2016-01-17 NOTE — Progress Notes (Signed)
  Subjective:     Philip Hernandez is a 60 y.o. male who presents for evaluation of sinus pain. Symptoms include: congestion, cough, headaches, nasal congestion, post nasal drip and sinus pressure. Onset of symptoms was 1 week ago. Symptoms have been gradually worsening since that time. Past history is significant for no history of pneumonia or bronchitis. Patient is a non-smoker.  The following portions of the patient's history were reviewed and updated as appropriate: allergies, current medications, past family history, past medical history, past social history, past surgical history and problem list.  * just finished doxycycline last Thursday for tick bite Review of Systems Pertinent items are noted in HPI.   Objective:    BP 93/75 mmHg  Pulse 57  Temp(Src) 98.7 F (37.1 C) (Oral)  Ht 5' 10.5" (1.791 m)  Wt 171 lb (77.565 kg)  BMI 24.18 kg/m2 General appearance: alert and cooperative Eyes: conjunctivae/corneas clear. PERRL, EOM's intact. Fundi benign. Ears: normal TM's and external ear canals both ears Nose: copious discharge, moderate congestion, turbinates red, no sinus tenderness Throat: lips, mucosa, and tongue normal; teeth and gums normal Neck: no adenopathy, no carotid bruit, no JVD, supple, symmetrical, trachea midline and thyroid not enlarged, symmetric, no tenderness/mass/nodules Lungs: clear to auscultation bilaterally Heart: regular rate and rhythm, S1, S2 normal, no murmur, click, rub or gallop    Assessment:    Acute bacterial URI.    Plan:   1. Take meds as prescribed 2. Use a cool mist humidifier especially during the winter months and when heat has been humid. 3. Use saline nose sprays frequently 4. Saline irrigations of the nose can be very helpful if done frequently.  * 4X daily for 1 week*  * Use of a nettie pot can be helpful with this. Follow directions with this* 5. Drink plenty of fluids 6. Keep thermostat turn down low 7.For any cough or  congestion  Use plain Mucinex- regular strength or max strength is fine   * Children- consult with Pharmacist for dosing 8. For fever or aces or pains- take tylenol or ibuprofen appropriate for age and weight.  * for fevers greater than 101 orally you may alternate ibuprofen and tylenol every  3 hours.    Meds ordered this encounter  Medications  . amoxicillin-clavulanate (AUGMENTIN) 875-125 MG tablet    Sig: Take 1 tablet by mouth 2 (two) times daily.    Dispense:  20 tablet    Refill:  0    Order Specific Question:  Supervising Provider    Answer:  Chipper Herb [[id number]]   [place], FNP

## 2016-02-07 ENCOUNTER — Ambulatory Visit (INDEPENDENT_AMBULATORY_CARE_PROVIDER_SITE_OTHER): Payer: BLUE CROSS/BLUE SHIELD | Admitting: Family Medicine

## 2016-02-07 ENCOUNTER — Encounter: Payer: Self-pay | Admitting: Family Medicine

## 2016-02-07 VITALS — BP 109/65 | HR 64 | Temp 97.0°F | Ht 70.5 in | Wt 171.0 lb

## 2016-02-07 DIAGNOSIS — J029 Acute pharyngitis, unspecified: Secondary | ICD-10-CM | POA: Diagnosis not present

## 2016-02-07 LAB — RAPID STREP SCREEN (MED CTR MEBANE ONLY): STREP GP A AG, IA W/REFLEX: NEGATIVE

## 2016-02-07 LAB — CULTURE, GROUP A STREP

## 2016-02-07 NOTE — Progress Notes (Signed)
   HPI  Patient presents today here with cough and cold symptoms.  Patient's lines and she's had sore throat for 2 days, headache and bandlike distribution, feeling "cloudy headed", congestion.  He was treated with Augmentin earlier this month and finished his course 4-5 days ago.  He was treated with doxycycline previous to that.  He denies any facial pain, chest pain, shortness of breath, fever  He has been using nasal saline rinses. No other medications. He is taking daily Zyrtec.  He's tolerating food and fluids normally.  PMH: Smoking status noted ROS: Per HPI  Objective: BP 109/65 mmHg  Pulse 64  Temp(Src) 97 F (36.1 C) (Oral)  Ht 5' 10.5" (1.791 m)  Wt 171 lb (77.565 kg)  BMI 24.18 kg/m2 Gen: NAD, alert, cooperative with exam HEENT: NCAT, nares with swollen turbinates bilaterally, TMs normal bilaterally, no facial pain with palpation of maxillary or frontal sinuses CV: RRR, good S1/S2, no murmur Resp: CTABL, no wheezes, non-labored Ext: No edema, warm Neuro: Alert and oriented, No gross deficits  Assessment and plan:  # Viral respiratory illness Reassurance provided Discussed supportive care including Mucinex DM, continue nasal saline washes, continued antihistamine, Tylenol Discussed virulence of viral infections and recommended good handwashing Return to clinic with any worsening symptoms, failure to improve, or new concerns.    Laroy Apple, MD [facility] 02/07/2016, 7:17 PM

## 2016-02-07 NOTE — Patient Instructions (Signed)
Continue the nasal saline washes  Try over the counter cough medicines, Mucinex DM 12 hour is my favorite  Viral Infections A viral infection can be caused by different types of viruses.Most viral infections are not serious and resolve on their own. However, some infections may cause severe symptoms and may lead to further complications. SYMPTOMS Viruses can frequently cause:  Minor sore throat.  Aches and pains.  Headaches.  Runny nose.  Different types of rashes.  Watery eyes.  Tiredness.  Cough.  Loss of appetite.  Gastrointestinal infections, resulting in nausea, vomiting, and diarrhea. These symptoms do not respond to antibiotics because the infection is not caused by bacteria. However, you might catch a bacterial infection following the viral infection. This is sometimes called a "superinfection." Symptoms of such a bacterial infection may include:  Worsening sore throat with pus and difficulty swallowing.  Swollen neck glands.  Chills and a high or persistent fever.  Severe headache.  Tenderness over the sinuses.  Persistent overall ill feeling (malaise), muscle aches, and tiredness (fatigue).  Persistent cough.  Yellow, green, or brown mucus production with coughing. HOME CARE INSTRUCTIONS   Only take over-the-counter or prescription medicines for pain, discomfort, diarrhea, or fever as directed by your caregiver.  Drink enough water and fluids to keep your urine clear or pale yellow. Sports drinks can provide valuable electrolytes, sugars, and hydration.  Get plenty of rest and maintain proper nutrition. Soups and broths with crackers or rice are fine. SEEK IMMEDIATE MEDICAL CARE IF:   You have severe headaches, shortness of breath, chest pain, neck pain, or an unusual rash.  You have uncontrolled vomiting, diarrhea, or you are unable to keep down fluids.  You or your child has an oral temperature above 102 F (38.9 C), not controlled by  medicine.  Your baby is older than 3 months with a rectal temperature of 102 F (38.9 C) or higher.  Your baby is 79 months old or younger with a rectal temperature of 100.4 F (38 C) or higher. MAKE SURE YOU:   Understand these instructions.  Will watch your condition.  Will get help right away if you are not doing well or get worse.   This information is not intended to replace advice given to you by your health care provider. Make sure you discuss any questions you have with your health care provider.   Document Released: 06/13/2005 Document Revised: 11/26/2011 Document Reviewed: 02/09/2015 Elsevier Interactive Patient Education [employer].

## 2016-03-26 DIAGNOSIS — D235 Other benign neoplasm of skin of trunk: Secondary | ICD-10-CM | POA: Diagnosis not present

## 2016-03-26 DIAGNOSIS — L821 Other seborrheic keratosis: Secondary | ICD-10-CM | POA: Diagnosis not present

## 2016-03-26 DIAGNOSIS — L918 Other hypertrophic disorders of the skin: Secondary | ICD-10-CM | POA: Diagnosis not present

## 2016-03-26 DIAGNOSIS — L570 Actinic keratosis: Secondary | ICD-10-CM | POA: Diagnosis not present

## 2016-03-26 DIAGNOSIS — D1801 Hemangioma of skin and subcutaneous tissue: Secondary | ICD-10-CM | POA: Diagnosis not present

## 2016-03-26 DIAGNOSIS — L814 Other melanin hyperpigmentation: Secondary | ICD-10-CM | POA: Diagnosis not present

## 2016-04-26 ENCOUNTER — Encounter: Payer: Self-pay | Admitting: Adult Health

## 2016-04-26 ENCOUNTER — Ambulatory Visit (INDEPENDENT_AMBULATORY_CARE_PROVIDER_SITE_OTHER): Payer: BLUE CROSS/BLUE SHIELD | Admitting: Adult Health

## 2016-04-26 VITALS — BP 111/74 | HR 71 | Ht 70.5 in | Wt 173.4 lb

## 2016-04-26 DIAGNOSIS — G629 Polyneuropathy, unspecified: Secondary | ICD-10-CM

## 2016-04-26 NOTE — Progress Notes (Signed)
PATIENT: Philip Hernandez DOB: [birth year]  REASON FOR VISIT: follow up- paresthesias HISTORY FROM: patient  HISTORY OF PRESENT ILLNESS:                                                                             Philip Hernandez is a 60 year old male with a history of paresthesias in the feet. He returns today for follow-up. He is currently on gabapentin 800 mg 4 times a day. He states this continues to work well for him. He does note that if he misses a tablet his discomfort increases. He also notes that it makes him feel "really weird" if he goes 10 or 11 hours without gabapentin. He denies any changes with his gait or balance. He states occasionally he will stagger he does not use a cane or walker. He states he did stump his toe on the left foot. He states his nail is messed up. He is not followed up with his PCP or a podiatrist regarding this. He returns today for an evaluation.                                                                                                                                                                           HISTORY 11/03/15: Philip Hernandez is a 60 year old male with a history of paresthesias in the feet. He returns today for follow-up. He continues taking gabapentin 800 mg 4 times a day. He states that the gabapentin still works fairly well for him. He states that he often does forget a dose and that will cause his pain to linger for 3 or 4 days. The patient states that he does have discomfort in the lower extremities and occasionally will have sharp shooting pains. The patient does not use an assistive device when ambulating. He states occasionally he will stumble but no true falls. He feels at times that his left toe will drag when he is walking. The patient states that he examined his feet regularly for wounds. He denies any new neurological symptoms. He returns today for an evaluation.  REVIEW OF SYSTEMS: Out of a complete 14 system review of symptoms, the patient  complains only of the following symptoms, and all other reviewed systems are negative.  See history of present illness  ALLERGIES: Allergies  Allergen Reactions  . Corn-Containing Products     HOME MEDICATIONS: Outpatient Medications Prior to Visit  Medication Sig Dispense Refill  .  acetaminophen (TYLENOL) 500 MG tablet Take 500 mg by mouth every 6 (six) hours as needed.    . cetirizine (ZYRTEC) 10 MG tablet Take 10 mg by mouth daily.    . cholecalciferol (VITAMIN D) 1000 UNITS tablet Take 1,000 Units by mouth daily.      Marland Kitchen gabapentin (NEURONTIN) 800 MG tablet Take 1 tablet (800 mg total) by mouth 4 (four) times daily. 360 tablet 3  . Multiple Vitamins-Minerals (AIRBORNE PO) Take by mouth daily.    Marland Kitchen omeprazole (PRILOSEC) 40 MG capsule TAKE ONE (1) CAPSULE EACH DAY 30 capsule 4   No facility-administered medications prior to visit.     PAST MEDICAL HISTORY: Past Medical History:  Diagnosis Date  . Colon polyp   . Colon polyps 2015  . Cough    chronic sinus issues, GERD with poor dietary control and Pulmonary Sarcoid.   Marland Kitchen GERD (gastroesophageal reflux disease) Jan 2012   [name]. Bx proven.  Deve abd pain in Jan 2012 following high dose steroids for sarcoid. s/p EGD and improvement in symptoms following PPI  . Hypogonadism male   . Kidney stone   . Malignant neoplasm of prostate [facility]) dec 2010   [name]. s/p surgery dec 2010, hx of rnormal PSA  since then  . Other and unspecified mycoses   . Painful respiration   . Peripheral neuropathy ([place])   . Sarcoidosis ([place]) dec 2011   stage 2 pulmonary, neck nodes, splenic granuloma  . Skin cancer   . Swelling, mass, or lump in head and neck     PAST SURGICAL HISTORY: Past Surgical History:  Procedure Laterality Date  . HERNIA REPAIR  13 yrs ago  . LYMPH NODE BIOPSY  05/2010, 07/2010  . nasal polyps  1984  . NASAL SINUS SURGERY  2013  . PROSTATE SURGERY  2010  . SHOULDER SURGERY Right 2009, 2010   x 2  . SKIN  CANCER EXCISION  11/16/14    FAMILY HISTORY: Family History  Problem Relation Age of Onset  . Colon cancer Mother 21  . Skin cancer Mother   . Colon cancer Brother 60  . Skin cancer Brother   . Colon cancer Maternal Aunt     dx in her 40s  . Cancer Maternal Aunt     "cancer of male organs" dx in her 43s  . Breast cancer Maternal Aunt 66    bilateral, dx again at 42  . Colon cancer Maternal Aunt 72  . Stomach cancer Maternal Aunt 68  . Stomach cancer Maternal Uncle 19  . Skin cancer Paternal Aunt   . Prostate cancer Paternal Uncle 45  . Stomach cancer Paternal Grandmother     dx in her 23s  . Stroke Paternal Grandfather   . Colon cancer Maternal Aunt 68  . Prostate cancer Paternal Uncle   . Prostate cancer Paternal Uncle   . Kidney cancer Paternal Uncle   . Lung cancer Paternal Uncle   . Lung cancer Paternal Uncle   . Cancer Cousin     paternal cousin with cancer NOS  . Cancer Cousin     paternal cousin with cancer NOS    SOCIAL HISTORY: Social History   Social History  . Marital status: Married    Spouse name: N/A  . Number of children: 2  . Years of education: N/A   Occupational History  . unemployed    Social History Main Topics  . Smoking status: Never Smoker  . Smokeless tobacco:  Never Used  . Alcohol use No  . Drug use: No  . Sexual activity: Not on file   Other Topics Concern  . Not on file   Social History Narrative   Married   2 children      PHYSICAL EXAM  Vitals:   04/26/16 0954  BP: 111/74  Pulse: 71  Weight: 173 lb 6.4 oz (78.7 kg)  Height: 5' 10.5" (1.791 m)   Body mass index is 24.53 kg/m.  Generalized: Well developed, in no acute distress   Neurological examination  Mentation: Alert oriented to time, place, history taking. Follows all commands speech and language fluent Cranial nerve II-XII: Pupils were equal round reactive to light. Extraocular movements were full, visual field were full on confrontational test. Facial  sensation and strength were normal. Uvula tongue midline. Head turning and shoulder shrug  were normal and symmetric. Motor: The motor testing reveals 5 over 5 strength of all 4 extremities. Good symmetric motor tone is noted throughout.  Sensory: Sensory testing is intact to soft touch on all 4 extremities. No evidence of extinction is noted.  Coordination: Cerebellar testing reveals good finger-nose-finger and heel-to-shin bilaterally.  Gait and station: Gait is normal. Tandem gait is normal. Romberg is negative. No drift is seen.  Reflexes: Deep tendon reflexes are symmetric and normal bilaterally.   DIAGNOSTIC DATA (LABS, IMAGING, TESTING) - I reviewed patient records, labs, notes, testing and imaging myself where available.      Component Value Date/Time   NA 142 05/31/2015 1648   K 4.7 05/31/2015 1648   CL 102 05/31/2015 1648   CO2 28 05/31/2015 1648   GLUCOSE 76 05/31/2015 1648   GLUCOSE 80 03/26/2013 1220   BUN 18 05/31/2015 1648   CREATININE 1.15 05/31/2015 1648   CREATININE 1.15 03/26/2013 1220   CALCIUM 9.6 05/31/2015 1648   PROT 6.7 05/31/2015 1648   ALBUMIN 4.1 05/31/2015 1648   AST 20 05/31/2015 1648   ALT 19 05/31/2015 1648   ALKPHOS 68 05/31/2015 1648   BILITOT 0.6 05/31/2015 1648   GFRNONAA 70 05/31/2015 1648   GFRNONAA 71 03/26/2013 1220   GFRAA 81 05/31/2015 1648   GFRAA 82 03/26/2013 1220   Lab Results  Component Value Date   CHOL 174 05/31/2015   HDL 42 05/31/2015   LDLCALC 114 (H) 05/31/2015   TRIG 91 05/31/2015   CHOLHDL 4.1 05/31/2015       ASSESSMENT AND PLAN 60 y.o. year old male  has a past medical history of Colon polyp; Colon polyps (2015); Cough; GERD (gastroesophageal reflux disease) (Jan 2012); Hypogonadism male; Kidney stone; Malignant neoplasm of prostate ([place]) (dec 2010); Other and unspecified mycoses; Painful respiration; Peripheral neuropathy ([place]); Sarcoidosis ([place]) (dec 2011); Skin cancer; and Swelling, mass, or lump in head and  neck. here with:  1. Paresthesias of the feet  Overall the patient has remained stable. He will continue on gabapentin 800 mg 4 times a day. He should follow-up with a podiatrist regarding injury to left toe.If his symptoms worsen or he develops any new symptoms he should let us know. We'll follow-up him 6 months with Dr. Leonie Man.     [name], MSN, NP-C 04/26/2016, 10:06 AM [facility] [address] [place], [place] [address] [phone]

## 2016-04-26 NOTE — Patient Instructions (Signed)
Continue Gabapentin 800 mg four times a day Continue to check feet nightly If your symptoms worsen or you develop new symptoms please let us know.

## 2016-04-26 NOTE — Progress Notes (Signed)
I agree with the assessment and plan as directed by NP .The patient is known to  Dr Leonie Man.

## 2016-05-09 ENCOUNTER — Other Ambulatory Visit: Payer: Self-pay | Admitting: Family Medicine

## 2016-05-30 ENCOUNTER — Other Ambulatory Visit: Payer: BLUE CROSS/BLUE SHIELD

## 2016-05-30 DIAGNOSIS — Z0000 Encounter for general adult medical examination without abnormal findings: Secondary | ICD-10-CM | POA: Diagnosis not present

## 2016-05-30 LAB — LIPID PANEL
CHOL/HDL RATIO: 4.7 ratio (ref 0.0–5.0)
CHOLESTEROL TOTAL: 177 mg/dL (ref 100–199)
HDL: 38 mg/dL — ABNORMAL LOW (ref >39)
LDL CALC: 113 mg/dL — AB (ref 0–99)
Triglycerides: 130 mg/dL (ref 0–149)
VLDL Cholesterol Cal: 26 mg/dL (ref 5–40)

## 2016-05-30 LAB — CMP14+EGFR
ALBUMIN: 4.3 g/dL (ref 3.5–5.5)
ALK PHOS: 72 IU/L (ref 39–117)
ALT: 19 IU/L (ref 0–44)
AST: 21 IU/L (ref 0–40)
Albumin/Globulin Ratio: 1.7 (ref 1.2–2.2)
BUN / CREAT RATIO: 15 (ref 9–20)
BUN: 16 mg/dL (ref 6–24)
Bilirubin Total: 0.9 mg/dL (ref 0.0–1.2)
CO2: 26 mmol/L (ref 18–29)
CREATININE: 1.08 mg/dL (ref 0.76–1.27)
Calcium: 9.6 mg/dL (ref 8.7–10.2)
Chloride: 102 mmol/L (ref 96–106)
GFR calc Af Amer: 86 mL/min/{1.73_m2} (ref >59)
GFR calc non Af Amer: 75 mL/min/{1.73_m2} (ref >59)
GLOBULIN, TOTAL: 2.5 g/dL (ref 1.5–4.5)
Glucose: 84 mg/dL (ref 65–99)
Potassium: 4.8 mmol/L (ref 3.5–5.2)
SODIUM: 141 mmol/L (ref 134–144)
Total Protein: 6.8 g/dL (ref 6.0–8.5)

## 2016-06-06 ENCOUNTER — Ambulatory Visit (INDEPENDENT_AMBULATORY_CARE_PROVIDER_SITE_OTHER): Payer: BLUE CROSS/BLUE SHIELD | Admitting: Family Medicine

## 2016-06-06 ENCOUNTER — Encounter: Payer: Self-pay | Admitting: Family Medicine

## 2016-06-06 VITALS — BP 110/77 | HR 66 | Temp 97.3°F | Ht 70.5 in | Wt 173.2 lb

## 2016-06-06 DIAGNOSIS — C61 Malignant neoplasm of prostate: Secondary | ICD-10-CM

## 2016-06-06 DIAGNOSIS — G629 Polyneuropathy, unspecified: Secondary | ICD-10-CM

## 2016-06-06 NOTE — Progress Notes (Signed)
   Subjective:    Patient ID: Philip Hernandez, male    DOB: 12-31-1955, 60 y.o.   MRN: [id number]  HPI  Patient here today for 6 month on hypertension and hyperlipidemia. He is really on no medicines for either hypertension or hyperlipidemia. He had blood work done last week. We reviewed those numbers basically lipids and blood sugar renal function PSA thyroid were all normal. He is having some daytime drowsiness and we discussed possibility of sleep apnea. I believe he thinks he is not having a problem there. He continues with peripheral neuropathy and takes gabapentin 800 mg 4 times a day. This is also followed by neurology.    Review of Systems  Constitutional: Negative.   HENT: Negative.   Eyes: Negative.   Respiratory: Negative.   Cardiovascular: Negative.   Gastrointestinal: Negative.   Endocrine: Negative.   Genitourinary: Negative.   Musculoskeletal: Negative.   Skin: Negative.   Allergic/Immunologic: Negative.   Neurological: Negative.   Hematological: Negative.   Psychiatric/Behavioral: Negative.     Patient Active Problem List   Diagnosis Date Noted  . CAP (community acquired pneumonia) 05/16/2015  . Family history of malignant neoplasm of gastrointestinal tract 12/23/2013  . Colon polyps   . Need for prophylactic vaccination and inoculation against influenza 07/24/2013  . HLD (hyperlipidemia) 03/26/2013  . Hyperuricemia 03/26/2013  . Pain in left hip 02/25/2013  . Neuropathy ([place]) 02/25/2013  . Numbness 02/25/2013  . Wheezing 01/13/2013  . Peripheral neuropathy in sarcoidosis ([place]) 12/08/2012  . Sarcoidosis ([place]) 09/28/2010  . ADENOCARCINOMA, PROSTATE 09/28/2010  . GERD 09/28/2010  . Nonspecific abnormal finding in stool contents 09/28/2010  . Cough 07/10/2010  . DYSPNEA 07/04/2009   Outpatient Encounter Prescriptions as of 06/06/2016  Medication Sig  . acetaminophen (TYLENOL) 500 MG tablet Take 500 mg by mouth every 6 (six) hours as needed.  . cetirizine  (ZYRTEC) 10 MG tablet Take 10 mg by mouth daily.  . cholecalciferol (VITAMIN D) 1000 UNITS tablet Take 1,000 Units by mouth daily.    Marland Kitchen gabapentin (NEURONTIN) 800 MG tablet Take 1 tablet (800 mg total) by mouth 4 (four) times daily.  . Multiple Vitamins-Minerals (AIRBORNE PO) Take by mouth daily.  Marland Kitchen omeprazole (PRILOSEC) 40 MG capsule TAKE ONE (1) CAPSULE EACH DAY   No facility-administered encounter medications on file as of 06/06/2016.          Objective:   Physical Exam  Constitutional: He appears well-developed and well-nourished.  Cardiovascular: Normal rate, regular rhythm and normal heart sounds.   Pulmonary/Chest: Effort normal and breath sounds normal.  Skin:  Dystrophic nail on left foot second toe. There is probably some fungal infection. He expressed interest in having this nail removed and we will schedule that for later date   BP 110/77   Pulse 66   Temp 97.3 F (36.3 C) (Oral)   Ht 5' 10.5" (1.791 m)   Wt 173 lb 3.2 oz (78.6 kg)   BMI 24.50 kg/m         Assessment & Plan:  1. Neuropathy ([place]) Continue with gabapentin and follow-up with neurology  2. ADENOCARCINOMA, PROSTATE He has had prostatectomy. PSA is barely detectable at 0.01  Wardell Honour MD

## 2016-06-14 ENCOUNTER — Ambulatory Visit: Payer: BLUE CROSS/BLUE SHIELD | Admitting: Family Medicine

## 2016-06-30 ENCOUNTER — Ambulatory Visit (INDEPENDENT_AMBULATORY_CARE_PROVIDER_SITE_OTHER): Payer: BLUE CROSS/BLUE SHIELD

## 2016-06-30 DIAGNOSIS — Z23 Encounter for immunization: Secondary | ICD-10-CM | POA: Diagnosis not present

## 2016-08-14 ENCOUNTER — Ambulatory Visit (INDEPENDENT_AMBULATORY_CARE_PROVIDER_SITE_OTHER): Payer: BLUE CROSS/BLUE SHIELD | Admitting: Nurse Practitioner

## 2016-08-14 ENCOUNTER — Encounter: Payer: Self-pay | Admitting: Nurse Practitioner

## 2016-08-14 VITALS — BP 127/78 | HR 65 | Temp 97.8°F | Ht 70.0 in | Wt 174.0 lb

## 2016-08-14 DIAGNOSIS — J069 Acute upper respiratory infection, unspecified: Secondary | ICD-10-CM

## 2016-08-14 MED ORDER — AMOXICILLIN 875 MG PO TABS: 875.0000 mg | ORAL_TABLET | Freq: Two times a day (BID) | ORAL | 0 refills | Status: DC

## 2016-08-14 MED ORDER — OMEPRAZOLE 40 MG PO CPDR
DELAYED_RELEASE_CAPSULE | ORAL | 5 refills | Status: DC
Start: 2016-08-14 — End: 2017-12-11

## 2016-08-14 MED ORDER — BENZONATATE 100 MG PO CAPS: 100.0000 mg | ORAL_CAPSULE | Freq: Three times a day (TID) | ORAL | 0 refills | Status: DC | PRN

## 2016-08-14 NOTE — Patient Instructions (Signed)

## 2016-08-14 NOTE — Progress Notes (Signed)
Subjective:     Philip Hernandez is a 60 y.o. male who presents for evaluation of sinus pain. Symptoms include: congestion, cough, facial pain, headaches, nasal congestion and sinus pressure. Onset of symptoms was 5 days ago. Symptoms have been gradually worsening since that time. Past history is significant for no history of pneumonia or bronchitis. Patient is a non-smoker. * History of sarcodosis The following portions of the patient's history were reviewed and updated as appropriate: allergies, current medications, past family history, past medical history, past social history, past surgical history and problem list.  Review of Systems Pertinent items noted in HPI and remainder of comprehensive ROS otherwise negative.   Objective:    BP 127/78   Pulse 65   Temp 97.8 F (36.6 C) (Oral)   Ht 5\' 10"  (1.778 m)   Wt 174 lb (78.9 kg)   BMI 24.97 kg/m  General appearance: alert, cooperative and mild distress Eyes: conjunctivae/corneas clear. PERRL, EOM's intact. Fundi benign. Ears: normal TM's and external ear canals both ears Nose: clear discharge, mild congestion, turbinates pale, no sinus tenderness Throat: lips, mucosa, and tongue normal; teeth and gums normal Neck: no adenopathy, no carotid bruit, no JVD, supple, symmetrical, trachea midline and thyroid not enlarged, symmetric, no tenderness/mass/nodules Lungs: clear to auscultation bilaterally and dry cough- no wheezes or ronchi Heart: regular rate and rhythm, S1, S2 normal, no murmur, click, rub or gallop    Assessment:    Acute upper esp infection with cough.    Plan:   1. Take meds as prescribed 2. Use a cool mist humidifier especially during the winter months and when heat has been humid. 3. Use saline nose sprays frequently 4. Saline irrigations of the nose can be very helpful if done frequently.  * 4X daily for 1 week*  * Use of a nettie pot can be helpful with this. Follow directions with this* 5. Drink plenty of  fluids 6. Keep thermostat turn down low 7.For any cough or congestion  Use plain Mucinex- regular strength or max strength is fine   * Children- consult with Pharmacist for dosing 8. For fever or aces or pains- take tylenol or ibuprofen appropriate for age and weight.  * for fevers greater than 101 orally you may alternate ibuprofen and tylenol every  3 hours.   Meds ordered this encounter  Medications  . amoxicillin (AMOXIL) 875 MG tablet    Sig: Take 1 tablet (875 mg total) by mouth 2 (two) times daily. 1 po BID    Dispense:  20 tablet    Refill:  0    Order Specific Question:   Supervising Provider    Answer:   VINCENT, CAROL L [[id number]]  . benzonatate (TESSALON PERLES) 100 MG capsule    Sig: Take 1 capsule (100 mg total) by mouth 3 (three) times daily as needed for cough.    Dispense:  20 capsule    Refill:  0    Order Specific Question:   Supervising Provider    Answer:   Eustaquio Maize Chilo, FNP

## 2016-08-14 NOTE — Addendum Note (Signed)
Addended by: Chevis Pretty on: 08/14/2016 06:19 PM   Modules accepted: Orders

## 2016-10-16 ENCOUNTER — Ambulatory Visit (INDEPENDENT_AMBULATORY_CARE_PROVIDER_SITE_OTHER): Payer: BLUE CROSS/BLUE SHIELD | Admitting: Family Medicine

## 2016-10-16 ENCOUNTER — Encounter: Payer: Self-pay | Admitting: Family Medicine

## 2016-10-16 VITALS — BP 132/73 | HR 71 | Temp 97.2°F | Ht 70.0 in | Wt 174.6 lb

## 2016-10-16 DIAGNOSIS — J209 Acute bronchitis, unspecified: Secondary | ICD-10-CM | POA: Diagnosis not present

## 2016-10-16 MED ORDER — BENZONATATE 100 MG PO CAPS: 100.0000 mg | ORAL_CAPSULE | Freq: Three times a day (TID) | ORAL | 0 refills | Status: DC | PRN

## 2016-10-16 MED ORDER — CEFDINIR 300 MG PO CAPS: 300.0000 mg | ORAL_CAPSULE | Freq: Two times a day (BID) | ORAL | 0 refills | Status: DC

## 2016-10-16 NOTE — Progress Notes (Signed)
   HPI  Patient presents today here with cough and cold.  Patient's explains  he's had 7-8 days of nasal congestion, headache, cough, sore throat, mild shortness of breath, malaise. He is tolerating food and fluids normally.  He denies any body aches or chest pain.  Course appears to be slowly worsening.  Amoxicillin in November for similar illness. He's also taking Tessalon Perles currently with good improvement.   PMH: Smoking status noted ROS: Per HPI  Objective: BP 132/73   Pulse 71   Temp 97.2 F (36.2 C) (Oral)   Ht 5\' 10"  (1.778 m)   Wt 174 lb 9.6 oz (79.2 kg)   BMI 25.05 kg/m  Gen: NAD, alert, cooperative with exam HEENT: NCAT, no facial pain or tenderness to palpation, oropharynx clear with slight redness, TMs normal bilaterally CV: RRR, good S1/S2, no murmur Resp: CTABL, no wheezes, non-labored Abd: SNTND, BS present, no guarding or organomegaly Ext: No edema, warm Neuro: Alert and oriented, No gross deficits  Assessment and plan:  # Acute bronchitis Given 7 days of symptoms with steadily worsening have covered him for pneumonia with Omnicef. Tessalon Perles are helping, refilled Supportive care discussed Low threshold for return with any worsening symptoms or failure to improve as expected.    Meds ordered this encounter  Medications  . benzonatate (TESSALON PERLES) 100 MG capsule    Sig: Take 1 capsule (100 mg total) by mouth 3 (three) times daily as needed for cough.    Dispense:  20 capsule    Refill:  0  . cefdinir (OMNICEF) 300 MG capsule    Sig: Take 1 capsule (300 mg total) by mouth 2 (two) times daily. 1 po BID    Dispense:  20 capsule    Refill:  [place], MD [facility] 10/16/2016, 3:18 PM

## 2016-10-16 NOTE — Patient Instructions (Signed)
Great to see you!   Acute Bronchitis, Adult Acute bronchitis is when air tubes (bronchi) in the lungs suddenly get swollen. The condition can make it hard to breathe. It can also cause these symptoms:  A cough.  Coughing up clear, yellow, or green mucus.  Wheezing.  Chest congestion.  Shortness of breath.  A fever.  Body aches.  Chills.  A sore throat.  Follow these instructions at home: Medicines  Take over-the-counter and prescription medicines only as told by your doctor.  If you were prescribed an antibiotic medicine, take it as told by your doctor. Do not stop taking the antibiotic even if you start to feel better. General instructions  Rest.  Drink enough fluids to keep your pee (urine) clear or pale yellow.  Avoid smoking and secondhand smoke. If you smoke and you need help quitting, ask your doctor. Quitting will help your lungs heal faster.  Use an inhaler, cool mist vaporizer, or humidifier as told by your doctor.  Keep all follow-up visits as told by your doctor. This is important. How is this prevented? To lower your risk of getting this condition again:  Wash your hands often with soap and water. If you cannot use soap and water, use hand sanitizer.  Avoid contact with people who have cold symptoms.  Try not to touch your hands to your mouth, nose, or eyes.  Make sure to get the flu shot every year.  Contact a doctor if:  Your symptoms do not get better in 2 weeks. Get help right away if:  You cough up blood.  You have chest pain.  You have very bad shortness of breath.  You become dehydrated.  You faint (pass out) or keep feeling like you are going to pass out.  You keep throwing up (vomiting).  You have a very bad headache.  Your fever or chills gets worse. This information is not intended to replace advice given to you by your health care provider. Make sure you discuss any questions you have with your health care  provider. Document Released: 02/20/2008 Document Revised: 04/11/2016 Document Reviewed: 02/22/2016 Elsevier Interactive Patient Education  2017 [employer]  

## 2016-10-30 ENCOUNTER — Ambulatory Visit (INDEPENDENT_AMBULATORY_CARE_PROVIDER_SITE_OTHER): Payer: BLUE CROSS/BLUE SHIELD | Admitting: Neurology

## 2016-10-30 ENCOUNTER — Encounter: Payer: Self-pay | Admitting: Neurology

## 2016-10-30 VITALS — BP 116/74 | HR 61 | Wt 173.0 lb

## 2016-10-30 DIAGNOSIS — R209 Unspecified disturbances of skin sensation: Secondary | ICD-10-CM | POA: Diagnosis not present

## 2016-10-30 DIAGNOSIS — IMO0001 Reserved for inherently not codable concepts without codable children: Secondary | ICD-10-CM

## 2016-10-30 MED ORDER — TOPIRAMATE 25 MG PO TABS: 25.0000 mg | ORAL_TABLET | Freq: Two times a day (BID) | ORAL | 3 refills | Status: DC

## 2016-10-30 NOTE — Progress Notes (Signed)
PATIENT: Philip Hernandez DOB: 09-14-1956  REASON FOR VISIT: follow up- paresthesias HISTORY FROM: patient  HISTORY OF PRESENT ILLNESS:                                                                             Philip Hernandez is a 61 year old male with a history of paresthesias in the feet. He returns today for follow-up. He is currently on gabapentin 800 mg 4 times a day. He states this continues to work well for him. He does note that if he misses a tablet his discomfort increases. He also notes that it makes him feel "really weird" if he goes 10 or 11 hours without gabapentin. He denies any changes with his gait or balance. He states occasionally he will stagger he does not use a cane or walker. He states he did stump his toe on the left foot. He states his nail is messed up. He is not followed up with his PCP or a podiatrist regarding this. He returns today for an evaluation.                                                                                                                                                                           HISTORY 11/03/15: Philip Hernandez is a 61 year old male with a history of paresthesias in the feet. He returns today for follow-up. He continues taking gabapentin 800 mg 4 times a day. He states that the gabapentin still works fairly well for him. He states that he often does forget a dose and that will cause his pain to linger for 3 or 4 days. The patient states that he does have discomfort in the lower extremities and occasionally will have sharp shooting pains. The patient does not use an assistive device when ambulating. He states occasionally he will stumble but no true falls. He feels at times that his left toe will drag when he is walking. The patient states that he examined his feet regularly for wounds. He denies any new neurological symptoms. He returns today for an evaluation. Update 10/30/2016 ; he returns for follow-up last visit 6 months ago with Philip Hernandez,  nurse practitioner. He states the paresthesias in his hands may be getting worse. He has occasional cramps as well as some sharp shooting pains. In fact drops objects from his hand and has particular difficulty time holding onto light objects but can grab heavy objects quite  well. Occasionally staggers a lot but has had no falls or injuries. He is currently on gabapentin 800 mg 4 times daily but does complain of excessive sleepiness as well as some dizziness. He has not tried Topamax and other medications for paresthesias in the past.  REVIEW OF SYSTEMS: Out of a complete 14 system review of symptoms, the patient complains only of the following symptoms, and all other reviewed systems are negative.  Light sensitivity, blurred vision, hearing loss, runny nose, shortness of breath, diarrhea, cold intolerance, frequent waking, daytime sleepiness, snoring, allergies, testicular pain, back pain, aching muscles, muscle cramps, neck stiffness, memory loss, numbness, tingling, weakness, tremors  ALLERGIES: Allergies  Allergen Reactions  . Corn-Containing Products     HOME MEDICATIONS: Outpatient Medications Prior to Visit  Medication Sig Dispense Refill  . acetaminophen (TYLENOL) 500 MG tablet Take 500 mg by mouth every 6 (six) hours as needed.    . benzonatate (TESSALON PERLES) 100 MG capsule Take 1 capsule (100 mg total) by mouth 3 (three) times daily as needed for cough. 20 capsule 0  . cetirizine (ZYRTEC) 10 MG tablet Take 10 mg by mouth daily.    . cholecalciferol (VITAMIN D) 1000 UNITS tablet Take 1,000 Units by mouth daily.      Marland Kitchen gabapentin (NEURONTIN) 800 MG tablet Take 1 tablet (800 mg total) by mouth 4 (four) times daily. 360 tablet 3  . Multiple Vitamins-Minerals (AIRBORNE PO) Take by mouth daily.    Marland Kitchen omeprazole (PRILOSEC) 40 MG capsule TAKE ONE (1) CAPSULE EACH DAY 30 capsule 5  . cefdinir (OMNICEF) 300 MG capsule Take 1 capsule (300 mg total) by mouth 2 (two) times daily. 1 po BID  (Patient not taking: Reported on 10/30/2016) 20 capsule 0   No facility-administered medications prior to visit.     PAST MEDICAL HISTORY: Past Medical History:  Diagnosis Date  . Colon polyp   . Colon polyps 2015  . Cough    chronic sinus issues, GERD with poor dietary control and Pulmonary Sarcoid.   Marland Kitchen GERD (gastroesophageal reflux disease) Jan 2012   Dr Philip Hernandez. Bx proven.  Deve abd pain in Jan 2012 following high dose steroids for sarcoid. s/p EGD and improvement in symptoms following PPI  . Hypogonadism male   . Kidney stone   . Malignant neoplasm of prostate [facility]) dec 2010   Dr Philip Hernandez. s/p surgery dec 2010, hx of rnormal PSA  since then  . Other and unspecified mycoses   . Painful respiration   . Peripheral neuropathy ([place])   . Sarcoidosis ([place]) dec 2011   stage 2 pulmonary, neck nodes, splenic granuloma  . Skin cancer   . Swelling, mass, or lump in head and neck     PAST SURGICAL HISTORY: Past Surgical History:  Procedure Laterality Date  . HERNIA REPAIR  13 yrs ago  . LYMPH NODE BIOPSY  05/2010, 07/2010  . nasal polyps  1984  . NASAL SINUS SURGERY  2013  . PROSTATE SURGERY  2010  . SHOULDER SURGERY Right 2009, 2010   x 2  . SKIN CANCER EXCISION  11/16/14    FAMILY HISTORY: Family History  Problem Relation Age of Onset  . Colon cancer Mother 51  . Skin cancer Mother   . Colon cancer Brother 69  . Skin cancer Brother   . Colon cancer Maternal Aunt     dx in her 66s  . Cancer Maternal Aunt     "cancer of male organs" dx in her  63s  . Breast cancer Maternal Aunt 66    bilateral, dx again at 33  . Colon cancer Maternal Aunt 63  . Stomach cancer Maternal Aunt 68  . Stomach cancer Maternal Uncle 82  . Skin cancer Paternal Aunt   . Prostate cancer Paternal Uncle 32  . Stomach cancer Paternal Grandmother     dx in her 52s  . Stroke Paternal Grandfather   . Colon cancer Maternal Aunt 68  . Prostate cancer Paternal Uncle   . Prostate cancer Paternal  Uncle   . Kidney cancer Paternal Uncle   . Lung cancer Paternal Uncle   . Lung cancer Paternal Uncle   . Cancer Cousin     paternal cousin with cancer NOS  . Cancer Cousin     paternal cousin with cancer NOS    SOCIAL HISTORY: Social History   Social History  . Marital status: Married    Spouse name: N/A  . Number of children: 2  . Years of education: N/A   Occupational History  . unemployed    Social History Main Topics  . Smoking status: Never Smoker  . Smokeless tobacco: Never Used  . Alcohol use No  . Drug use: No  . Sexual activity: Not on file   Other Topics Concern  . Not on file   Social History Narrative   Married   2 children      PHYSICAL EXAM  Vitals:   10/30/16 1010  BP: 116/74  Pulse: 61  Weight: 173 lb (78.5 kg)   Body mass index is 24.82 kg/m.  Generalized: Well developed, in no acute distress   Neurological examination  Mentation: Alert oriented to time, place, history taking. Follows all commands speech and language fluent Cranial nerve II-XII: Pupils were equal round reactive to light. Extraocular movements were full, visual field were full on confrontational test. Facial sensation and strength were normal. Uvula tongue midline. Head turning and shoulder shrug  were normal and symmetric. Motor: The motor testing reveals 5 over 5 strength of all 4 extremities. Good symmetric motor tone is noted throughout.  Sensory: Sensory testing is intact to soft touch on all 4 extremities. No evidence of extinction is noted. Diminished vibration sensation over toes and ankles bilaterally. Position sense is preserved. Coordination: Cerebellar testing reveals good finger-nose-finger and heel-to-shin bilaterally.  Gait and station: Gait is normal. Tandem gait is normal. Romberg is weakly positive No drift is seen.  Reflexes: Deep tendon reflexes are symmetric and normal bilaterally except both ankle jerks are depressed.Marland Kitchen   DIAGNOSTIC DATA (LABS, IMAGING,  TESTING) - I reviewed patient records, labs, notes, testing and imaging myself where available.      Component Value Date/Time   NA 141 05/30/2016 1053   K 4.8 05/30/2016 1053   CL 102 05/30/2016 1053   CO2 26 05/30/2016 1053   GLUCOSE 84 05/30/2016 1053   GLUCOSE 80 03/26/2013 1220   BUN 16 05/30/2016 1053   CREATININE 1.08 05/30/2016 1053   CREATININE 1.15 03/26/2013 1220   CALCIUM 9.6 05/30/2016 1053   PROT 6.8 05/30/2016 1053   ALBUMIN 4.3 05/30/2016 1053   AST 21 05/30/2016 1053   ALT 19 05/30/2016 1053   ALKPHOS 72 05/30/2016 1053   BILITOT 0.9 05/30/2016 1053   GFRNONAA 75 05/30/2016 1053   GFRNONAA 71 03/26/2013 1220   GFRAA 86 05/30/2016 1053   GFRAA 82 03/26/2013 1220   Lab Results  Component Value Date   CHOL 177 05/30/2016   HDL  38 (L) 05/30/2016   LDLCALC 113 (H) 05/30/2016   TRIG 130 05/30/2016   CHOLHDL 4.7 05/30/2016       ASSESSMENT AND PLAN 61 y.o. year old male  has a past medical history of Colon polyp; Colon polyps (2015); Cough; GERD (gastroesophageal reflux disease) (Jan 2012); Hypogonadism male; Kidney stone; Malignant neoplasm of prostate ([place]) (dec 2010); Other and unspecified mycoses; Painful respiration; Peripheral neuropathy ([place]); Sarcoidosis ([place]) (dec 2011); Skin cancer; and Swelling, mass, or lump in head and neck. here with:  Peripheral neuropathy likely related to underlying sarcoidosis with painful paresthesias suboptimally controlled on current regimen of gabapentin  I had a long discussion the patient with regards to his paresthesias , tingling, pain and cramps from peripheral neuropathy which still seems to be suboptimally controlled on the current dose of gabapentin 800 mg 4 times daily. I recommend we add Topamax 20 family grams twice daily and increase as tolerated. I discussed side effects with the patient and advised him to call me. He will continue gabapentin the current dose as well. Greater than 50% time during this 25 minute  visit was spent on counseling and coordination of care about his painful paresthesias and planning treatment and answering questions He will return for follow-up in 3 months with nurse practitioner call earlier if necessary   Antony Contras, MD 10/30/2016, 10:42 AM [facility] [address] [place], [place] [address] [phone]

## 2016-10-30 NOTE — Patient Instructions (Signed)
I had a long discussion the patient with regards to his paresthesias , tingling, pain and cramps from peripheral neuropathy which still seems to be suboptimally controlled on the current dose of gabapentin 800 mg 4 times daily. I recommend we add Topamax 20 family grams twice daily and increase as tolerated. I discussed side effects with the patient and advised him to call me. He will continue gabapentin the current dose as well. He will return for follow-up in 3 months with nurse practitioner call earlier if necessary

## 2016-11-12 DIAGNOSIS — C61 Malignant neoplasm of prostate: Secondary | ICD-10-CM | POA: Diagnosis not present

## 2016-11-16 DIAGNOSIS — N5201 Erectile dysfunction due to arterial insufficiency: Secondary | ICD-10-CM | POA: Diagnosis not present

## 2016-11-16 DIAGNOSIS — C61 Malignant neoplasm of prostate: Secondary | ICD-10-CM | POA: Diagnosis not present

## 2016-11-21 ENCOUNTER — Encounter: Payer: Self-pay | Admitting: Radiation Oncology

## 2016-11-27 ENCOUNTER — Ambulatory Visit: Payer: BLUE CROSS/BLUE SHIELD | Admitting: Family Medicine

## 2016-11-30 ENCOUNTER — Ambulatory Visit (INDEPENDENT_AMBULATORY_CARE_PROVIDER_SITE_OTHER): Payer: BLUE CROSS/BLUE SHIELD | Admitting: Family Medicine

## 2016-11-30 ENCOUNTER — Encounter: Payer: Self-pay | Admitting: Family Medicine

## 2016-11-30 VITALS — BP 133/79 | HR 64 | Temp 96.7°F | Ht 70.0 in | Wt 169.4 lb

## 2016-11-30 DIAGNOSIS — Z0000 Encounter for general adult medical examination without abnormal findings: Secondary | ICD-10-CM | POA: Diagnosis not present

## 2016-11-30 NOTE — Patient Instructions (Signed)
Great to see you!  Lets follow up in 4 months unless you need [place] sooner.   We will call with labs within 1 week.

## 2016-11-30 NOTE — Progress Notes (Signed)
   HPI  Patient presents today here for an annual physical exam.  Patient feels well overall.  He's had a recent bump in his PSA and is being evaluated for recurrent prostate cancer.  He feels well overall. He denies any pain.  Patient is fasting today.  She is not exercising regularly. He's not watching his diet carefully.   PMH: Smoking status noted ROS: Per HPI  Objective: BP 133/79   Pulse 64   Temp (!) 96.7 F (35.9 C) (Oral)   Ht '5\' 10"'$  (1.778 m)   Wt 169 lb 6.4 oz (76.8 kg)   BMI 24.31 kg/m  Gen: NAD, alert, cooperative with exam HEENT: NCAT, EOMI, PERRL CV: RRR, good S1/S2, no murmur Resp: CTABL, no wheezes, non-labored Abd: SNTND, BS present, no guarding or organomegaly Ext: No edema, warm Neuro: Alert and oriented, plus patellar tendon reflexes bilaterally, strength 5/5 and sensation intact in bilateral lower extremities  Assessment and plan:  # Annual physical exam Normal exam This healthy lifestyle changes. Discussed healthy exercise and eating habits. Lipids  previously slightly elevated, repeat today, fasting    Orders Placed This Encounter  Procedures  . Lipid panel  . CBC with Differential/Platelet  . CMP14+EGFR  . Hepatitis C antibody    Laroy Apple, MD [facility] 11/30/2016, 5:26 PM

## 2016-12-01 LAB — CBC WITH DIFFERENTIAL/PLATELET
Basophils Absolute: 0 10*3/uL (ref 0.0–0.2)
Basos: 1 %
EOS (ABSOLUTE): 0.3 10*3/uL (ref 0.0–0.4)
EOS: 4 %
HEMATOCRIT: 40.7 % (ref 37.5–51.0)
HEMOGLOBIN: 13.6 g/dL (ref 13.0–17.7)
Immature Grans (Abs): 0 10*3/uL (ref 0.0–0.1)
Immature Granulocytes: 0 %
LYMPHS ABS: 2.7 10*3/uL (ref 0.7–3.1)
Lymphs: 35 %
MCH: 28 pg (ref 26.6–33.0)
MCHC: 33.4 g/dL (ref 31.5–35.7)
MCV: 84 fL (ref 79–97)
MONOCYTES: 10 %
Monocytes Absolute: 0.8 10*3/uL (ref 0.1–0.9)
NEUTROS ABS: 3.9 10*3/uL (ref 1.4–7.0)
Neutrophils: 50 %
Platelets: 142 10*3/uL — ABNORMAL LOW (ref 150–379)
RBC: 4.85 x10E6/uL (ref 4.14–5.80)
RDW: 14.1 % (ref 12.3–15.4)
WBC: 7.7 10*3/uL (ref 3.4–10.8)

## 2016-12-01 LAB — CMP14+EGFR
A/G RATIO: 1.6 (ref 1.2–2.2)
ALT: 18 IU/L (ref 0–44)
AST: 22 IU/L (ref 0–40)
Albumin: 4.3 g/dL (ref 3.5–5.5)
Alkaline Phosphatase: 71 IU/L (ref 39–117)
BUN/Creatinine Ratio: 15 (ref 9–20)
BUN: 18 mg/dL (ref 6–24)
Bilirubin Total: 1.2 mg/dL (ref 0.0–1.2)
CALCIUM: 9.3 mg/dL (ref 8.7–10.2)
CO2: 27 mmol/L (ref 18–29)
CREATININE: 1.2 mg/dL (ref 0.76–1.27)
Chloride: 103 mmol/L (ref 96–106)
GFR, EST AFRICAN AMERICAN: 76 mL/min/{1.73_m2} (ref >59)
GFR, EST NON AFRICAN AMERICAN: 66 mL/min/{1.73_m2} (ref >59)
Globulin, Total: 2.7 g/dL (ref 1.5–4.5)
Glucose: 73 mg/dL (ref 65–99)
POTASSIUM: 4.7 mmol/L (ref 3.5–5.2)
Sodium: 143 mmol/L (ref 134–144)
TOTAL PROTEIN: 7 g/dL (ref 6.0–8.5)

## 2016-12-01 LAB — LIPID PANEL
Chol/HDL Ratio: 4.8 ratio units (ref 0.0–5.0)
Cholesterol, Total: 183 mg/dL (ref 100–199)
HDL: 38 mg/dL — AB (ref >39)
LDL Calculated: 120 mg/dL — ABNORMAL HIGH (ref 0–99)
TRIGLYCERIDES: 125 mg/dL (ref 0–149)
VLDL Cholesterol Cal: 25 mg/dL (ref 5–40)

## 2016-12-01 LAB — HEPATITIS C ANTIBODY: Hep C Virus Ab: <0.1 s/co ratio (ref 0.0–0.9)

## 2016-12-03 ENCOUNTER — Ambulatory Visit: Payer: BLUE CROSS/BLUE SHIELD | Admitting: Radiation Oncology

## 2016-12-03 ENCOUNTER — Ambulatory Visit: Payer: BLUE CROSS/BLUE SHIELD

## 2016-12-12 ENCOUNTER — Other Ambulatory Visit: Payer: Self-pay | Admitting: Adult Health

## 2016-12-12 ENCOUNTER — Ambulatory Visit
Admission: RE | Admit: 2016-12-12 | Discharge: 2016-12-12 | Disposition: A | Discharge status: Home / Self Care | Payer: BLUE CROSS/BLUE SHIELD | Source: Ambulatory Visit | Attending: Radiation Oncology | Admitting: Radiation Oncology

## 2016-12-12 ENCOUNTER — Encounter: Payer: Self-pay | Admitting: Radiation Oncology

## 2016-12-12 VITALS — BP 137/85 | HR 58 | Resp 16 | Ht 70.0 in | Wt 174.4 lb

## 2016-12-12 DIAGNOSIS — Z86010 Personal history of colonic polyps: Secondary | ICD-10-CM | POA: Insufficient documentation

## 2016-12-12 DIAGNOSIS — C61 Malignant neoplasm of prostate: Secondary | ICD-10-CM

## 2016-12-12 DIAGNOSIS — Z85828 Personal history of other malignant neoplasm of skin: Secondary | ICD-10-CM | POA: Diagnosis not present

## 2016-12-12 DIAGNOSIS — Z8042 Family history of malignant neoplasm of prostate: Secondary | ICD-10-CM | POA: Diagnosis not present

## 2016-12-12 DIAGNOSIS — Z801 Family history of malignant neoplasm of trachea, bronchus and lung: Secondary | ICD-10-CM | POA: Diagnosis not present

## 2016-12-12 DIAGNOSIS — K219 Gastro-esophageal reflux disease without esophagitis: Secondary | ICD-10-CM | POA: Diagnosis not present

## 2016-12-12 DIAGNOSIS — Z800 Family history of malignant neoplasm of digestive organs: Secondary | ICD-10-CM | POA: Diagnosis not present

## 2016-12-12 DIAGNOSIS — Z9889 Other specified postprocedural states: Secondary | ICD-10-CM | POA: Insufficient documentation

## 2016-12-12 DIAGNOSIS — Z888 Allergy status to other drugs, medicaments and biological substances status: Secondary | ICD-10-CM | POA: Insufficient documentation

## 2016-12-12 DIAGNOSIS — Z803 Family history of malignant neoplasm of breast: Secondary | ICD-10-CM | POA: Insufficient documentation

## 2016-12-12 DIAGNOSIS — Z79899 Other long term (current) drug therapy: Secondary | ICD-10-CM | POA: Diagnosis not present

## 2016-12-12 DIAGNOSIS — Z9079 Acquired absence of other genital organ(s): Secondary | ICD-10-CM | POA: Diagnosis not present

## 2016-12-12 DIAGNOSIS — Z87442 Personal history of urinary calculi: Secondary | ICD-10-CM | POA: Diagnosis not present

## 2016-12-12 DIAGNOSIS — D869 Sarcoidosis, unspecified: Secondary | ICD-10-CM | POA: Insufficient documentation

## 2016-12-12 DIAGNOSIS — Z510 Encounter for antineoplastic radiation therapy: Secondary | ICD-10-CM | POA: Insufficient documentation

## 2016-12-12 DIAGNOSIS — R972 Elevated prostate specific antigen [PSA]: Secondary | ICD-10-CM | POA: Diagnosis not present

## 2016-12-12 DIAGNOSIS — Z823 Family history of stroke: Secondary | ICD-10-CM | POA: Insufficient documentation

## 2016-12-12 DIAGNOSIS — G629 Polyneuropathy, unspecified: Secondary | ICD-10-CM | POA: Diagnosis not present

## 2016-12-12 NOTE — Progress Notes (Signed)
See progress note under physician encounter. 

## 2016-12-12 NOTE — Progress Notes (Signed)
GU Location of Tumor / Histology: biochemical recurrent prostate cancer s/p prostatectomy  If Prostate Cancer, Gleason Score is (3 + 3) and PSA was (2.84) pre treatment. PSA as of 11/16/16 is 0.26  Philip Hernandez had a prostatectomy 08/22/2009. His PSA has been undetectable since surgery until now.    Past/Anticipated interventions by urology, if any: prostatectomy, routine PSA assessment, referral to radiation oncology for salvage xrt  Past/Anticipated interventions by medical oncology, if any: no  Weight changes, if any: no  Bowel/Bladder complaints, if any: IPSS  5 with nocturia. Reports occasional stress incontinence. Denies dysuria or hematuria.   Nausea/Vomiting, if any: no  Pain issues, if any:  Take gabapentin for neuropathy in his hands and feet. Reports occasional left hip pain, groin, low abdominal and shoulder pain.   SAFETY ISSUES:  Prior radiation? no  Pacemaker/ICD? no  Possible current pregnancy? no  Is the patient on methotrexate? no  Current Complaints / other details:  61 year old male. Married.

## 2016-12-12 NOTE — Progress Notes (Signed)
Radiation Oncology         [phone]) [phone] ________________________________  Initial outpatient Consultation  Name: Philip Hernandez MRN: [id number]  Date: 12/12/2016  DOB: 27-Mar-1956  FG:HWEXHB [name], MD  Raynelle Bring, MD   REFERRING PHYSICIAN: Raynelle Bring, MD  DIAGNOSIS: 61 yo man s/p prostatectomy for stage pT2 adenocarcinoma with Gleason's 3+3 and rising detectable PSA of 026    ICD-9-CM ICD-10-CM   1. Malignant neoplasm of prostate ([place]) 185 C61     HISTORY OF PRESENT ILLNESS: Philip Hernandez is a 61 y.o. male with a diagnosis of biochemical recurrent prostate cancer, Gleason's 3+3. He is status post prostatectomy on 08/22/09. Before the treatment, his PSA was 2.84. Pathology showed bilateral gland invovlement and the carcinoma is very close (less than 0.1 cm) from apical surgical margin of resection.  Since his procedure, his prostate was undetectable until February 2018. His most recent PSA, performed on 11/16/16, was 0.26. Patient is scheduled for his next PSA evaluation on 12/31/16. He was referred to radiation oncology from Dr. Alinda Money.  The patient reviewed the biopsy results with his urologist and he has kindly been referred today for discussion of potential radiation treatment options.   PREVIOUS RADIATION THERAPY: No  PAST MEDICAL HISTORY:  Past Medical History:  Diagnosis Date  . Colon polyp   . Colon polyps 2015  . Cough    chronic sinus issues, GERD with poor dietary control and Pulmonary Sarcoid.   Marland Kitchen GERD (gastroesophageal reflux disease) Jan 2012   [name]. Bx proven.  Deve abd pain in Jan 2012 following high dose steroids for sarcoid. s/p EGD and improvement in symptoms following PPI  . Hypogonadism male   . Kidney stone   . Malignant neoplasm of prostate [facility]) dec 2010   [name]. s/p surgery dec 2010, hx of rnormal PSA  since then  . Other and unspecified mycoses   . Painful respiration   . Peripheral neuropathy ([place])   . Sarcoidosis ([place]) dec  2011   stage 2 pulmonary, neck nodes, splenic granuloma  . Skin cancer   . Swelling, mass, or lump in head and neck       PAST SURGICAL HISTORY: Past Surgical History:  Procedure Laterality Date  . HERNIA REPAIR  13 yrs ago  . LYMPH NODE BIOPSY  05/2010, 07/2010  . nasal polyps  1984  . NASAL SINUS SURGERY  2013  . PROSTATE SURGERY  2010  . SHOULDER SURGERY Right 2009, 2010   x 2  . SKIN CANCER EXCISION  11/16/14    FAMILY HISTORY:  Family History  Problem Relation Age of Onset  . Colon cancer Mother 63  . Skin cancer Mother   . Colon cancer Brother 71  . Skin cancer Brother   . Colon cancer Maternal Aunt     dx in her 79s  . Cancer Maternal Aunt     "cancer of male organs" dx in her 90s  . Breast cancer Maternal Aunt 66    bilateral, dx again at 51  . Colon cancer Maternal Aunt 19  . Stomach cancer Maternal Aunt 68  . Stomach cancer Maternal Uncle 73  . Skin cancer Paternal Aunt   . Prostate cancer Paternal Uncle 75  . Stomach cancer Paternal Grandmother     dx in her 65s  . Stroke Paternal Grandfather   . Colon cancer Maternal Aunt 68  . Prostate cancer Paternal Uncle   . Prostate cancer Paternal Uncle   . Kidney cancer Paternal  Uncle   . Lung cancer Paternal Uncle   . Lung cancer Paternal Uncle   . Cancer Cousin     paternal cousin with cancer NOS  . Cancer Cousin     paternal cousin with cancer NOS    SOCIAL HISTORY:  Social History   Social History  . Marital status: Married    Spouse name: N/A  . Number of children: 2  . Years of education: N/A   Occupational History  . unemployed    Social History Main Topics  . Smoking status: Never Smoker  . Smokeless tobacco: Never Used  . Alcohol use No  . Drug use: No  . Sexual activity: Not on file   Other Topics Concern  . Not on file   Social History Narrative   Married   2 children    ALLERGIES: Corn-containing products  MEDICATIONS:  Current Outpatient Prescriptions  Medication Sig  Dispense Refill  . acetaminophen (TYLENOL) 500 MG tablet Take 500 mg by mouth every 6 (six) hours as needed.    . benzonatate (TESSALON PERLES) 100 MG capsule Take 1 capsule (100 mg total) by mouth 3 (three) times daily as needed for cough. 20 capsule 0  . cetirizine (ZYRTEC) 10 MG tablet Take 10 mg by mouth daily.    . cholecalciferol (VITAMIN D) 1000 UNITS tablet Take 1,000 Units by mouth daily.      Marland Kitchen gabapentin (NEURONTIN) 800 MG tablet Take 1 tablet (800 mg total) by mouth 4 (four) times daily. 360 tablet 3  . Multiple Vitamins-Minerals (AIRBORNE PO) Take by mouth daily.    Marland Kitchen omeprazole (PRILOSEC) 40 MG capsule TAKE ONE (1) CAPSULE EACH DAY 30 capsule 5  . topiramate (TOPAMAX) 25 MG tablet Take 1 tablet (25 mg total) by mouth 2 (two) times daily. 120 tablet 3   No current facility-administered medications for this encounter.     REVIEW OF SYSTEMS:  On review of systems, the patient reports that he is doing well overall. He reports neuropathy in his haps and feet that is managed with Gabapentin. He denies any chest pain, shortness of breath, cough, fevers, chills, night sweats, unintended weight changes. He denies any bowel disturbances, and denies abdominal pain, nausea or vomiting. He reports occasional left hip, groin, lower abdominal, and shoulder pain. His IPSS was 5, indicating mild urinary symptoms. He is positive for occasional stress incontinence. He is able to complete sexual activity with most attempts. A complete review of systems is obtained and is otherwise negative.    PHYSICAL EXAM:  Wt Readings from Last 3 Encounters:  12/12/16 174 lb 6.4 oz (79.1 kg)  11/30/16 169 lb 6.4 oz (76.8 kg)  10/30/16 173 lb (78.5 kg)   Temp Readings from Last 3 Encounters:  11/30/16 (!) 96.7 F (35.9 C) (Oral)  10/16/16 97.2 F (36.2 C) (Oral)  08/14/16 97.8 F (36.6 C) (Oral)   BP Readings from Last 3 Encounters:  11/30/16 133/79  10/30/16 116/74  10/16/16 132/73   Pulse Readings  from Last 3 Encounters:  11/30/16 64  10/30/16 61  10/16/16 71   Pain Assessment Pain Score: 0-No pain/10  In general this is a well appearing caucasian gentleman in no acute distress. He is alert and oriented x4 and appropriate throughout the examination. HEENT reveals that the patient is normocephalic, atraumatic. EOMs are intact. PERRLA. Skin is intact without any evidence of gross lesions. Cardiovascular exam reveals a regular rate and rhythm, no clicks rubs or murmurs are auscultated. Chest is  clear to auscultation bilaterally. Lymphatic assessment is performed and does not reveal any adenopathy in the cervical, supraclavicular, axillary, or inguinal chains. Abdomen has active bowel sounds in all quadrants and is intact. The abdomen is soft, non tender, non distended. Lower extremities are negative for pretibial pitting edema, deep calf tenderness, cyanosis or clubbing.   KPS = 100  100 - Normal; no complaints; no evidence of disease. 90   - Able to carry on normal activity; minor signs or symptoms of disease. 80   - Normal activity with effort; some signs or symptoms of disease. 60   - Cares for self; unable to carry on normal activity or to do active work. 60   - Requires occasional assistance, but is able to care for most of his personal needs. 50   - Requires considerable assistance and frequent medical care. 62   - Disabled; requires special care and assistance. 83   - Severely disabled; hospital admission is indicated although death not imminent. 33   - Very sick; hospital admission necessary; active supportive treatment necessary. 10   - Moribund; fatal processes progressing rapidly. 0     - Dead  Karnofsky DA, Abelmann [place], Craver LS and Burchenal [facility] [phone]) The use of the nitrogen mustards in the palliative treatment of carcinoma: with particular reference to bronchogenic carcinoma Cancer 1 [phone]  LABORATORY DATA:  Lab Results  Component Value Date   WBC 7.7 11/30/2016    HGB 12.0 (L) 12/10/2011   HCT 40.7 11/30/2016   MCV 84 11/30/2016   PLT 142 (L) 11/30/2016   Lab Results  Component Value Date   NA 143 11/30/2016   K 4.7 11/30/2016   CL 103 11/30/2016   CO2 27 11/30/2016   Lab Results  Component Value Date   ALT 18 11/30/2016   AST 22 11/30/2016   ALKPHOS 71 11/30/2016   BILITOT 1.2 11/30/2016     RADIOGRAPHY: No results found.    IMPRESSION/PLAN: 1. 61 y.o. gentleman with biochemical recurrence of Stage pT2c adenocarcinoma of the prostate with Gleason Score of 3+3, and PSA of 0.26.  Today, I talked to the patient and family about the findings and work-up thus far.  We discussed the natural history of biochemical recurrent prostate cancer and general treatment, highlighting the role of radiotherapy in the management.  We discussed the available radiation techniques, and focused on the details of logistics and delivery.  We reviewed the anticipated acute and late sequelae associated with radiation in this setting.  The patient was encouraged to ask questions that I answered to the best of my ability. The patient would like to proceed with radiation and will be scheduled for CT simulation.  Given the undetectable PSA for 7 years, suddenly inflecting upward to 0.26, I am interested in the value of the next PSA planned on 4/16.  If the sudden upward inflection is indicative of a rapidly rising PSA, we may need to re-evaluate the systemic staging imaging options.  If the PSA remains at a similar level, we will proceed with CT simulation on 4/19 at 8 am.  I spent 60 minutes minutes face to face with the patient and more than 50% of that time was spent in counseling and/or coordination of care.     Nicholos Johns, PA-C    Tyler Pita, MD  [facility] Direct Dial: [phone]  Fax: [phone] [email or web address]  Skype  [email or web address]   This document serves as a record of services personally  performed by Freeman Caldron,  PA-C and Tyler Pita, MD. It was created on their behalf by Bethann Humble, a trained medical scribe. The creation of this record is based on the scribe's personal observations and the provider's statements to them. This document has been checked and approved by the attending provider.

## 2016-12-14 ENCOUNTER — Telehealth: Payer: Self-pay | Admitting: Medical Oncology

## 2016-12-14 NOTE — Progress Notes (Signed)
Spoke with Philip Hernandez to introduce myself and role as the prostate oncology nurse navigator. I was unable to meet him 12/10/16 when he consulted with Dr. Tammi Klippel. He states he had questions but Dr. Tammi Klippel answered those during consult. He states he had surgery in 2010 and maintained an undetectable PSA until 11/12/16. He had it repeated 11/16/16 and it remained elevated. He was shocked. He will have PSA redrawn 12/1616 followed with CT simulation on 01/03/17 unless the PSA increases, then he will have restaging scans. I gave him my office number and asked him to call me with questions or concerns and will follow up with him 01/03/17 at his CT simulation.

## 2016-12-17 ENCOUNTER — Telehealth: Payer: Self-pay | Admitting: Radiation Oncology

## 2016-12-17 NOTE — Telephone Encounter (Signed)
Faxed complete and signed FMLA paperwork to [facility] [phone] as requested by patient. Fax confirmation of delivery obtained.

## 2016-12-31 DIAGNOSIS — C61 Malignant neoplasm of prostate: Secondary | ICD-10-CM | POA: Diagnosis not present

## 2017-01-03 ENCOUNTER — Encounter: Payer: Self-pay | Admitting: Medical Oncology

## 2017-01-03 ENCOUNTER — Ambulatory Visit
Admission: RE | Admit: 2017-01-03 | Discharge: 2017-01-03 | Disposition: A | Discharge status: Home / Self Care | Payer: BLUE CROSS/BLUE SHIELD | Source: Ambulatory Visit | Attending: Radiation Oncology | Admitting: Radiation Oncology

## 2017-01-03 DIAGNOSIS — Z823 Family history of stroke: Secondary | ICD-10-CM | POA: Diagnosis not present

## 2017-01-03 DIAGNOSIS — Z85828 Personal history of other malignant neoplasm of skin: Secondary | ICD-10-CM | POA: Diagnosis not present

## 2017-01-03 DIAGNOSIS — Z510 Encounter for antineoplastic radiation therapy: Secondary | ICD-10-CM | POA: Diagnosis not present

## 2017-01-03 DIAGNOSIS — K219 Gastro-esophageal reflux disease without esophagitis: Secondary | ICD-10-CM | POA: Diagnosis not present

## 2017-01-03 DIAGNOSIS — Z8042 Family history of malignant neoplasm of prostate: Secondary | ICD-10-CM | POA: Diagnosis not present

## 2017-01-03 DIAGNOSIS — Z801 Family history of malignant neoplasm of trachea, bronchus and lung: Secondary | ICD-10-CM | POA: Diagnosis not present

## 2017-01-03 DIAGNOSIS — Z803 Family history of malignant neoplasm of breast: Secondary | ICD-10-CM | POA: Diagnosis not present

## 2017-01-03 DIAGNOSIS — C61 Malignant neoplasm of prostate: Secondary | ICD-10-CM

## 2017-01-03 DIAGNOSIS — Z87442 Personal history of urinary calculi: Secondary | ICD-10-CM | POA: Diagnosis not present

## 2017-01-03 DIAGNOSIS — G629 Polyneuropathy, unspecified: Secondary | ICD-10-CM | POA: Diagnosis not present

## 2017-01-03 DIAGNOSIS — Z888 Allergy status to other drugs, medicaments and biological substances status: Secondary | ICD-10-CM | POA: Diagnosis not present

## 2017-01-03 DIAGNOSIS — Z86010 Personal history of colonic polyps: Secondary | ICD-10-CM | POA: Diagnosis not present

## 2017-01-03 DIAGNOSIS — Z800 Family history of malignant neoplasm of digestive organs: Secondary | ICD-10-CM | POA: Diagnosis not present

## 2017-01-03 DIAGNOSIS — Z9889 Other specified postprocedural states: Secondary | ICD-10-CM | POA: Diagnosis not present

## 2017-01-03 DIAGNOSIS — D869 Sarcoidosis, unspecified: Secondary | ICD-10-CM | POA: Diagnosis not present

## 2017-01-03 DIAGNOSIS — Z9079 Acquired absence of other genital organ(s): Secondary | ICD-10-CM | POA: Diagnosis not present

## 2017-01-03 NOTE — Progress Notes (Signed)
  Radiation Oncology         (336) [phone] ________________________________  Name: Philip Hernandez MRN: [id number]  Date: 01/03/2017  DOB: 06-27-1956  SIMULATION AND TREATMENT PLANNING NOTE    ICD-9-CM ICD-10-CM   1. ADENOCARCINOMA, PROSTATE 185 C61     DIAGNOSIS: 61 yo man s/p prostatectomy for stage pT2 adenocarcinoma with Gleason's 3+3 and rising detectable PSA of 0.26  NARRATIVE:  The patient was brought to the [facility].  Identity was confirmed.  All relevant records and images related to the planned course of therapy were reviewed.  The patient freely provided informed written consent to proceed with treatment after reviewing the details related to the planned course of therapy. The consent form was witnessed and verified by the simulation staff.  Then, the patient was set-up in a stable reproducible supine position for radiation therapy.  A vacuum lock pillow device was custom fabricated to position his legs in a reproducible immobilized position.  Then, I performed a urethrogram under sterile conditions to identify the prostatic apex.  CT images were obtained.  Surface markings were placed.  The CT images were loaded into the planning software.  Then the prostate target and avoidance structures including the rectum, bladder, bowel and hips were contoured.  Treatment planning then occurred.  The radiation prescription was entered and confirmed.  A total of one complex treatment devices were fabricated. I have requested : Intensity Modulated Radiotherapy (IMRT) is medically necessary for this case for the following reason:  Rectal sparing.Marland Kitchen  PLAN:  The patient will receive 68.4 Gy in 38 fractions.  ________________________________  Sheral Apley Tammi Klippel, M.D.  This document serves as a record of services personally performed by Tyler Pita, MD. It was created on his behalf by Bethann Humble, a trained medical scribe. The creation of this record is based on the scribe's  personal observations and the provider's statements to them. This document has been checked and approved by the attending provider.

## 2017-01-10 DIAGNOSIS — Z803 Family history of malignant neoplasm of breast: Secondary | ICD-10-CM | POA: Diagnosis not present

## 2017-01-10 DIAGNOSIS — Z9889 Other specified postprocedural states: Secondary | ICD-10-CM | POA: Diagnosis not present

## 2017-01-10 DIAGNOSIS — Z87442 Personal history of urinary calculi: Secondary | ICD-10-CM | POA: Diagnosis not present

## 2017-01-10 DIAGNOSIS — Z823 Family history of stroke: Secondary | ICD-10-CM | POA: Diagnosis not present

## 2017-01-10 DIAGNOSIS — Z800 Family history of malignant neoplasm of digestive organs: Secondary | ICD-10-CM | POA: Diagnosis not present

## 2017-01-10 DIAGNOSIS — Z86010 Personal history of colonic polyps: Secondary | ICD-10-CM | POA: Diagnosis not present

## 2017-01-10 DIAGNOSIS — Z888 Allergy status to other drugs, medicaments and biological substances status: Secondary | ICD-10-CM | POA: Diagnosis not present

## 2017-01-10 DIAGNOSIS — G629 Polyneuropathy, unspecified: Secondary | ICD-10-CM | POA: Diagnosis not present

## 2017-01-10 DIAGNOSIS — Z8042 Family history of malignant neoplasm of prostate: Secondary | ICD-10-CM | POA: Diagnosis not present

## 2017-01-10 DIAGNOSIS — Z85828 Personal history of other malignant neoplasm of skin: Secondary | ICD-10-CM | POA: Diagnosis not present

## 2017-01-10 DIAGNOSIS — Z9079 Acquired absence of other genital organ(s): Secondary | ICD-10-CM | POA: Diagnosis not present

## 2017-01-10 DIAGNOSIS — D869 Sarcoidosis, unspecified: Secondary | ICD-10-CM | POA: Diagnosis not present

## 2017-01-10 DIAGNOSIS — Z510 Encounter for antineoplastic radiation therapy: Secondary | ICD-10-CM | POA: Diagnosis not present

## 2017-01-10 DIAGNOSIS — Z801 Family history of malignant neoplasm of trachea, bronchus and lung: Secondary | ICD-10-CM | POA: Diagnosis not present

## 2017-01-10 DIAGNOSIS — C61 Malignant neoplasm of prostate: Secondary | ICD-10-CM | POA: Diagnosis not present

## 2017-01-10 DIAGNOSIS — K219 Gastro-esophageal reflux disease without esophagitis: Secondary | ICD-10-CM | POA: Diagnosis not present

## 2017-01-14 ENCOUNTER — Ambulatory Visit: Payer: BLUE CROSS/BLUE SHIELD | Admitting: Radiation Oncology

## 2017-01-14 ENCOUNTER — Ambulatory Visit
Admission: RE | Admit: 2017-01-14 | Payer: BLUE CROSS/BLUE SHIELD | Source: Ambulatory Visit | Admitting: Radiation Oncology

## 2017-01-14 ENCOUNTER — Encounter: Payer: Self-pay | Admitting: Medical Oncology

## 2017-01-14 DIAGNOSIS — Z87442 Personal history of urinary calculi: Secondary | ICD-10-CM | POA: Diagnosis not present

## 2017-01-14 DIAGNOSIS — Z801 Family history of malignant neoplasm of trachea, bronchus and lung: Secondary | ICD-10-CM | POA: Diagnosis not present

## 2017-01-14 DIAGNOSIS — Z510 Encounter for antineoplastic radiation therapy: Secondary | ICD-10-CM | POA: Diagnosis not present

## 2017-01-14 DIAGNOSIS — Z803 Family history of malignant neoplasm of breast: Secondary | ICD-10-CM | POA: Diagnosis not present

## 2017-01-14 DIAGNOSIS — C61 Malignant neoplasm of prostate: Secondary | ICD-10-CM | POA: Diagnosis not present

## 2017-01-14 DIAGNOSIS — Z823 Family history of stroke: Secondary | ICD-10-CM | POA: Diagnosis not present

## 2017-01-14 DIAGNOSIS — Z9889 Other specified postprocedural states: Secondary | ICD-10-CM | POA: Diagnosis not present

## 2017-01-14 DIAGNOSIS — G629 Polyneuropathy, unspecified: Secondary | ICD-10-CM | POA: Diagnosis not present

## 2017-01-14 DIAGNOSIS — Z8042 Family history of malignant neoplasm of prostate: Secondary | ICD-10-CM | POA: Diagnosis not present

## 2017-01-14 DIAGNOSIS — D869 Sarcoidosis, unspecified: Secondary | ICD-10-CM | POA: Diagnosis not present

## 2017-01-14 DIAGNOSIS — Z86010 Personal history of colonic polyps: Secondary | ICD-10-CM | POA: Diagnosis not present

## 2017-01-14 DIAGNOSIS — Z888 Allergy status to other drugs, medicaments and biological substances status: Secondary | ICD-10-CM | POA: Diagnosis not present

## 2017-01-14 DIAGNOSIS — K219 Gastro-esophageal reflux disease without esophagitis: Secondary | ICD-10-CM | POA: Diagnosis not present

## 2017-01-14 DIAGNOSIS — Z9079 Acquired absence of other genital organ(s): Secondary | ICD-10-CM | POA: Diagnosis not present

## 2017-01-14 DIAGNOSIS — Z800 Family history of malignant neoplasm of digestive organs: Secondary | ICD-10-CM | POA: Diagnosis not present

## 2017-01-14 DIAGNOSIS — Z85828 Personal history of other malignant neoplasm of skin: Secondary | ICD-10-CM | POA: Diagnosis not present

## 2017-01-14 NOTE — Progress Notes (Signed)
I met Philip Hernandez and his wife today before his first radiation treatment. I had spoken by phone but not met in person. We discussed the importance of having a comfortable full bladder and how this helps to reduce the side effects. I gave his wife my business card and asked them to call with questions or concerns. Will continue to follow.

## 2017-01-15 ENCOUNTER — Ambulatory Visit
Admission: RE | Admit: 2017-01-15 | Discharge: 2017-01-15 | Disposition: A | Discharge status: Home / Self Care | Payer: BLUE CROSS/BLUE SHIELD | Source: Ambulatory Visit | Attending: Radiation Oncology | Admitting: Radiation Oncology

## 2017-01-15 ENCOUNTER — Ambulatory Visit: Payer: BLUE CROSS/BLUE SHIELD

## 2017-01-15 DIAGNOSIS — Z86010 Personal history of colonic polyps: Secondary | ICD-10-CM | POA: Diagnosis not present

## 2017-01-15 DIAGNOSIS — Z803 Family history of malignant neoplasm of breast: Secondary | ICD-10-CM | POA: Diagnosis not present

## 2017-01-15 DIAGNOSIS — Z888 Allergy status to other drugs, medicaments and biological substances status: Secondary | ICD-10-CM | POA: Diagnosis not present

## 2017-01-15 DIAGNOSIS — Z9889 Other specified postprocedural states: Secondary | ICD-10-CM | POA: Diagnosis not present

## 2017-01-15 DIAGNOSIS — D869 Sarcoidosis, unspecified: Secondary | ICD-10-CM | POA: Diagnosis not present

## 2017-01-15 DIAGNOSIS — Z801 Family history of malignant neoplasm of trachea, bronchus and lung: Secondary | ICD-10-CM | POA: Diagnosis not present

## 2017-01-15 DIAGNOSIS — Z823 Family history of stroke: Secondary | ICD-10-CM | POA: Diagnosis not present

## 2017-01-15 DIAGNOSIS — C61 Malignant neoplasm of prostate: Secondary | ICD-10-CM | POA: Diagnosis not present

## 2017-01-15 DIAGNOSIS — Z8042 Family history of malignant neoplasm of prostate: Secondary | ICD-10-CM | POA: Diagnosis not present

## 2017-01-15 DIAGNOSIS — Z9079 Acquired absence of other genital organ(s): Secondary | ICD-10-CM | POA: Diagnosis not present

## 2017-01-15 DIAGNOSIS — Z85828 Personal history of other malignant neoplasm of skin: Secondary | ICD-10-CM | POA: Diagnosis not present

## 2017-01-15 DIAGNOSIS — Z87442 Personal history of urinary calculi: Secondary | ICD-10-CM | POA: Diagnosis not present

## 2017-01-15 DIAGNOSIS — Z800 Family history of malignant neoplasm of digestive organs: Secondary | ICD-10-CM | POA: Diagnosis not present

## 2017-01-15 DIAGNOSIS — K219 Gastro-esophageal reflux disease without esophagitis: Secondary | ICD-10-CM | POA: Diagnosis not present

## 2017-01-15 DIAGNOSIS — Z510 Encounter for antineoplastic radiation therapy: Secondary | ICD-10-CM | POA: Diagnosis not present

## 2017-01-15 DIAGNOSIS — G629 Polyneuropathy, unspecified: Secondary | ICD-10-CM | POA: Diagnosis not present

## 2017-01-16 ENCOUNTER — Ambulatory Visit: Payer: BLUE CROSS/BLUE SHIELD

## 2017-01-16 ENCOUNTER — Ambulatory Visit
Admission: RE | Admit: 2017-01-16 | Discharge: 2017-01-16 | Disposition: A | Discharge status: Home / Self Care | Payer: BLUE CROSS/BLUE SHIELD | Source: Ambulatory Visit | Attending: Radiation Oncology | Admitting: Radiation Oncology

## 2017-01-16 DIAGNOSIS — Z85828 Personal history of other malignant neoplasm of skin: Secondary | ICD-10-CM | POA: Diagnosis not present

## 2017-01-16 DIAGNOSIS — Z510 Encounter for antineoplastic radiation therapy: Secondary | ICD-10-CM | POA: Diagnosis not present

## 2017-01-16 DIAGNOSIS — Z86010 Personal history of colonic polyps: Secondary | ICD-10-CM | POA: Diagnosis not present

## 2017-01-16 DIAGNOSIS — Z87442 Personal history of urinary calculi: Secondary | ICD-10-CM | POA: Diagnosis not present

## 2017-01-16 DIAGNOSIS — K219 Gastro-esophageal reflux disease without esophagitis: Secondary | ICD-10-CM | POA: Diagnosis not present

## 2017-01-16 DIAGNOSIS — Z888 Allergy status to other drugs, medicaments and biological substances status: Secondary | ICD-10-CM | POA: Diagnosis not present

## 2017-01-16 DIAGNOSIS — Z801 Family history of malignant neoplasm of trachea, bronchus and lung: Secondary | ICD-10-CM | POA: Diagnosis not present

## 2017-01-16 DIAGNOSIS — G629 Polyneuropathy, unspecified: Secondary | ICD-10-CM | POA: Diagnosis not present

## 2017-01-16 DIAGNOSIS — D869 Sarcoidosis, unspecified: Secondary | ICD-10-CM | POA: Diagnosis not present

## 2017-01-16 DIAGNOSIS — Z823 Family history of stroke: Secondary | ICD-10-CM | POA: Diagnosis not present

## 2017-01-16 DIAGNOSIS — Z803 Family history of malignant neoplasm of breast: Secondary | ICD-10-CM | POA: Diagnosis not present

## 2017-01-16 DIAGNOSIS — Z9889 Other specified postprocedural states: Secondary | ICD-10-CM | POA: Diagnosis not present

## 2017-01-16 DIAGNOSIS — Z9079 Acquired absence of other genital organ(s): Secondary | ICD-10-CM | POA: Diagnosis not present

## 2017-01-16 DIAGNOSIS — C61 Malignant neoplasm of prostate: Secondary | ICD-10-CM | POA: Diagnosis not present

## 2017-01-16 DIAGNOSIS — Z8042 Family history of malignant neoplasm of prostate: Secondary | ICD-10-CM | POA: Diagnosis not present

## 2017-01-16 DIAGNOSIS — Z800 Family history of malignant neoplasm of digestive organs: Secondary | ICD-10-CM | POA: Diagnosis not present

## 2017-01-17 ENCOUNTER — Ambulatory Visit: Payer: BLUE CROSS/BLUE SHIELD

## 2017-01-17 ENCOUNTER — Ambulatory Visit
Admission: RE | Admit: 2017-01-17 | Discharge: 2017-01-17 | Disposition: A | Discharge status: Home / Self Care | Payer: BLUE CROSS/BLUE SHIELD | Source: Ambulatory Visit | Attending: Radiation Oncology | Admitting: Radiation Oncology

## 2017-01-17 DIAGNOSIS — Z86010 Personal history of colonic polyps: Secondary | ICD-10-CM | POA: Diagnosis not present

## 2017-01-17 DIAGNOSIS — Z823 Family history of stroke: Secondary | ICD-10-CM | POA: Diagnosis not present

## 2017-01-17 DIAGNOSIS — Z510 Encounter for antineoplastic radiation therapy: Secondary | ICD-10-CM | POA: Diagnosis not present

## 2017-01-17 DIAGNOSIS — Z801 Family history of malignant neoplasm of trachea, bronchus and lung: Secondary | ICD-10-CM | POA: Diagnosis not present

## 2017-01-17 DIAGNOSIS — Z8042 Family history of malignant neoplasm of prostate: Secondary | ICD-10-CM | POA: Diagnosis not present

## 2017-01-17 DIAGNOSIS — G629 Polyneuropathy, unspecified: Secondary | ICD-10-CM | POA: Diagnosis not present

## 2017-01-17 DIAGNOSIS — Z87442 Personal history of urinary calculi: Secondary | ICD-10-CM | POA: Diagnosis not present

## 2017-01-17 DIAGNOSIS — Z800 Family history of malignant neoplasm of digestive organs: Secondary | ICD-10-CM | POA: Diagnosis not present

## 2017-01-17 DIAGNOSIS — K219 Gastro-esophageal reflux disease without esophagitis: Secondary | ICD-10-CM | POA: Diagnosis not present

## 2017-01-17 DIAGNOSIS — Z888 Allergy status to other drugs, medicaments and biological substances status: Secondary | ICD-10-CM | POA: Diagnosis not present

## 2017-01-17 DIAGNOSIS — Z803 Family history of malignant neoplasm of breast: Secondary | ICD-10-CM | POA: Diagnosis not present

## 2017-01-17 DIAGNOSIS — Z9889 Other specified postprocedural states: Secondary | ICD-10-CM | POA: Diagnosis not present

## 2017-01-17 DIAGNOSIS — Z85828 Personal history of other malignant neoplasm of skin: Secondary | ICD-10-CM | POA: Diagnosis not present

## 2017-01-17 DIAGNOSIS — D869 Sarcoidosis, unspecified: Secondary | ICD-10-CM | POA: Diagnosis not present

## 2017-01-17 DIAGNOSIS — Z9079 Acquired absence of other genital organ(s): Secondary | ICD-10-CM | POA: Diagnosis not present

## 2017-01-17 DIAGNOSIS — C61 Malignant neoplasm of prostate: Secondary | ICD-10-CM | POA: Diagnosis not present

## 2017-01-18 ENCOUNTER — Ambulatory Visit
Admission: RE | Admit: 2017-01-18 | Discharge: 2017-01-18 | Disposition: A | Discharge status: Home / Self Care | Payer: BLUE CROSS/BLUE SHIELD | Source: Ambulatory Visit | Attending: Radiation Oncology | Admitting: Radiation Oncology

## 2017-01-18 ENCOUNTER — Ambulatory Visit: Payer: BLUE CROSS/BLUE SHIELD

## 2017-01-18 DIAGNOSIS — Z800 Family history of malignant neoplasm of digestive organs: Secondary | ICD-10-CM | POA: Diagnosis not present

## 2017-01-18 DIAGNOSIS — Z510 Encounter for antineoplastic radiation therapy: Secondary | ICD-10-CM | POA: Diagnosis not present

## 2017-01-18 DIAGNOSIS — Z86010 Personal history of colonic polyps: Secondary | ICD-10-CM | POA: Diagnosis not present

## 2017-01-18 DIAGNOSIS — Z9079 Acquired absence of other genital organ(s): Secondary | ICD-10-CM | POA: Diagnosis not present

## 2017-01-18 DIAGNOSIS — Z888 Allergy status to other drugs, medicaments and biological substances status: Secondary | ICD-10-CM | POA: Diagnosis not present

## 2017-01-18 DIAGNOSIS — C61 Malignant neoplasm of prostate: Secondary | ICD-10-CM | POA: Diagnosis not present

## 2017-01-18 DIAGNOSIS — Z801 Family history of malignant neoplasm of trachea, bronchus and lung: Secondary | ICD-10-CM | POA: Diagnosis not present

## 2017-01-18 DIAGNOSIS — Z803 Family history of malignant neoplasm of breast: Secondary | ICD-10-CM | POA: Diagnosis not present

## 2017-01-18 DIAGNOSIS — Z85828 Personal history of other malignant neoplasm of skin: Secondary | ICD-10-CM | POA: Diagnosis not present

## 2017-01-18 DIAGNOSIS — D869 Sarcoidosis, unspecified: Secondary | ICD-10-CM | POA: Diagnosis not present

## 2017-01-18 DIAGNOSIS — Z9889 Other specified postprocedural states: Secondary | ICD-10-CM | POA: Diagnosis not present

## 2017-01-18 DIAGNOSIS — K219 Gastro-esophageal reflux disease without esophagitis: Secondary | ICD-10-CM | POA: Diagnosis not present

## 2017-01-18 DIAGNOSIS — G629 Polyneuropathy, unspecified: Secondary | ICD-10-CM | POA: Diagnosis not present

## 2017-01-18 DIAGNOSIS — Z87442 Personal history of urinary calculi: Secondary | ICD-10-CM | POA: Diagnosis not present

## 2017-01-18 DIAGNOSIS — Z8042 Family history of malignant neoplasm of prostate: Secondary | ICD-10-CM | POA: Diagnosis not present

## 2017-01-18 DIAGNOSIS — Z823 Family history of stroke: Secondary | ICD-10-CM | POA: Diagnosis not present

## 2017-01-21 ENCOUNTER — Ambulatory Visit: Payer: BLUE CROSS/BLUE SHIELD

## 2017-01-21 ENCOUNTER — Ambulatory Visit
Admission: RE | Admit: 2017-01-21 | Discharge: 2017-01-21 | Disposition: A | Discharge status: Home / Self Care | Payer: BLUE CROSS/BLUE SHIELD | Source: Ambulatory Visit | Attending: Radiation Oncology | Admitting: Radiation Oncology

## 2017-01-21 DIAGNOSIS — Z86010 Personal history of colonic polyps: Secondary | ICD-10-CM | POA: Diagnosis not present

## 2017-01-21 DIAGNOSIS — K219 Gastro-esophageal reflux disease without esophagitis: Secondary | ICD-10-CM | POA: Diagnosis not present

## 2017-01-21 DIAGNOSIS — G629 Polyneuropathy, unspecified: Secondary | ICD-10-CM | POA: Diagnosis not present

## 2017-01-21 DIAGNOSIS — Z8042 Family history of malignant neoplasm of prostate: Secondary | ICD-10-CM | POA: Diagnosis not present

## 2017-01-21 DIAGNOSIS — Z803 Family history of malignant neoplasm of breast: Secondary | ICD-10-CM | POA: Diagnosis not present

## 2017-01-21 DIAGNOSIS — Z85828 Personal history of other malignant neoplasm of skin: Secondary | ICD-10-CM | POA: Diagnosis not present

## 2017-01-21 DIAGNOSIS — D869 Sarcoidosis, unspecified: Secondary | ICD-10-CM | POA: Diagnosis not present

## 2017-01-21 DIAGNOSIS — Z823 Family history of stroke: Secondary | ICD-10-CM | POA: Diagnosis not present

## 2017-01-21 DIAGNOSIS — Z801 Family history of malignant neoplasm of trachea, bronchus and lung: Secondary | ICD-10-CM | POA: Diagnosis not present

## 2017-01-21 DIAGNOSIS — Z510 Encounter for antineoplastic radiation therapy: Secondary | ICD-10-CM | POA: Diagnosis not present

## 2017-01-21 DIAGNOSIS — Z9889 Other specified postprocedural states: Secondary | ICD-10-CM | POA: Diagnosis not present

## 2017-01-21 DIAGNOSIS — Z888 Allergy status to other drugs, medicaments and biological substances status: Secondary | ICD-10-CM | POA: Diagnosis not present

## 2017-01-21 DIAGNOSIS — C61 Malignant neoplasm of prostate: Secondary | ICD-10-CM | POA: Diagnosis not present

## 2017-01-21 DIAGNOSIS — Z87442 Personal history of urinary calculi: Secondary | ICD-10-CM | POA: Diagnosis not present

## 2017-01-21 DIAGNOSIS — Z800 Family history of malignant neoplasm of digestive organs: Secondary | ICD-10-CM | POA: Diagnosis not present

## 2017-01-21 DIAGNOSIS — Z9079 Acquired absence of other genital organ(s): Secondary | ICD-10-CM | POA: Diagnosis not present

## 2017-01-22 ENCOUNTER — Ambulatory Visit: Payer: BLUE CROSS/BLUE SHIELD

## 2017-01-22 ENCOUNTER — Ambulatory Visit
Admission: RE | Admit: 2017-01-22 | Discharge: 2017-01-22 | Disposition: A | Discharge status: Home / Self Care | Payer: BLUE CROSS/BLUE SHIELD | Source: Ambulatory Visit | Attending: Radiation Oncology | Admitting: Radiation Oncology

## 2017-01-22 DIAGNOSIS — Z803 Family history of malignant neoplasm of breast: Secondary | ICD-10-CM | POA: Diagnosis not present

## 2017-01-22 DIAGNOSIS — Z8042 Family history of malignant neoplasm of prostate: Secondary | ICD-10-CM | POA: Diagnosis not present

## 2017-01-22 DIAGNOSIS — G629 Polyneuropathy, unspecified: Secondary | ICD-10-CM | POA: Diagnosis not present

## 2017-01-22 DIAGNOSIS — Z86010 Personal history of colonic polyps: Secondary | ICD-10-CM | POA: Diagnosis not present

## 2017-01-22 DIAGNOSIS — Z823 Family history of stroke: Secondary | ICD-10-CM | POA: Diagnosis not present

## 2017-01-22 DIAGNOSIS — Z9889 Other specified postprocedural states: Secondary | ICD-10-CM | POA: Diagnosis not present

## 2017-01-22 DIAGNOSIS — Z800 Family history of malignant neoplasm of digestive organs: Secondary | ICD-10-CM | POA: Diagnosis not present

## 2017-01-22 DIAGNOSIS — Z87442 Personal history of urinary calculi: Secondary | ICD-10-CM | POA: Diagnosis not present

## 2017-01-22 DIAGNOSIS — Z888 Allergy status to other drugs, medicaments and biological substances status: Secondary | ICD-10-CM | POA: Diagnosis not present

## 2017-01-22 DIAGNOSIS — C61 Malignant neoplasm of prostate: Secondary | ICD-10-CM | POA: Diagnosis not present

## 2017-01-22 DIAGNOSIS — D869 Sarcoidosis, unspecified: Secondary | ICD-10-CM | POA: Diagnosis not present

## 2017-01-22 DIAGNOSIS — Z85828 Personal history of other malignant neoplasm of skin: Secondary | ICD-10-CM | POA: Diagnosis not present

## 2017-01-22 DIAGNOSIS — Z801 Family history of malignant neoplasm of trachea, bronchus and lung: Secondary | ICD-10-CM | POA: Diagnosis not present

## 2017-01-22 DIAGNOSIS — K219 Gastro-esophageal reflux disease without esophagitis: Secondary | ICD-10-CM | POA: Diagnosis not present

## 2017-01-22 DIAGNOSIS — Z510 Encounter for antineoplastic radiation therapy: Secondary | ICD-10-CM | POA: Diagnosis not present

## 2017-01-22 DIAGNOSIS — Z9079 Acquired absence of other genital organ(s): Secondary | ICD-10-CM | POA: Diagnosis not present

## 2017-01-23 ENCOUNTER — Ambulatory Visit
Admission: RE | Admit: 2017-01-23 | Discharge: 2017-01-23 | Disposition: A | Discharge status: Home / Self Care | Payer: BLUE CROSS/BLUE SHIELD | Source: Ambulatory Visit | Attending: Radiation Oncology | Admitting: Radiation Oncology

## 2017-01-23 ENCOUNTER — Ambulatory Visit: Payer: BLUE CROSS/BLUE SHIELD

## 2017-01-23 DIAGNOSIS — Z801 Family history of malignant neoplasm of trachea, bronchus and lung: Secondary | ICD-10-CM | POA: Diagnosis not present

## 2017-01-23 DIAGNOSIS — Z9079 Acquired absence of other genital organ(s): Secondary | ICD-10-CM | POA: Diagnosis not present

## 2017-01-23 DIAGNOSIS — Z87442 Personal history of urinary calculi: Secondary | ICD-10-CM | POA: Diagnosis not present

## 2017-01-23 DIAGNOSIS — Z803 Family history of malignant neoplasm of breast: Secondary | ICD-10-CM | POA: Diagnosis not present

## 2017-01-23 DIAGNOSIS — Z823 Family history of stroke: Secondary | ICD-10-CM | POA: Diagnosis not present

## 2017-01-23 DIAGNOSIS — D869 Sarcoidosis, unspecified: Secondary | ICD-10-CM | POA: Diagnosis not present

## 2017-01-23 DIAGNOSIS — Z510 Encounter for antineoplastic radiation therapy: Secondary | ICD-10-CM | POA: Diagnosis not present

## 2017-01-23 DIAGNOSIS — G629 Polyneuropathy, unspecified: Secondary | ICD-10-CM | POA: Diagnosis not present

## 2017-01-23 DIAGNOSIS — Z85828 Personal history of other malignant neoplasm of skin: Secondary | ICD-10-CM | POA: Diagnosis not present

## 2017-01-23 DIAGNOSIS — C61 Malignant neoplasm of prostate: Secondary | ICD-10-CM | POA: Diagnosis not present

## 2017-01-23 DIAGNOSIS — Z888 Allergy status to other drugs, medicaments and biological substances status: Secondary | ICD-10-CM | POA: Diagnosis not present

## 2017-01-23 DIAGNOSIS — K219 Gastro-esophageal reflux disease without esophagitis: Secondary | ICD-10-CM | POA: Diagnosis not present

## 2017-01-23 DIAGNOSIS — Z8042 Family history of malignant neoplasm of prostate: Secondary | ICD-10-CM | POA: Diagnosis not present

## 2017-01-23 DIAGNOSIS — Z800 Family history of malignant neoplasm of digestive organs: Secondary | ICD-10-CM | POA: Diagnosis not present

## 2017-01-23 DIAGNOSIS — Z86010 Personal history of colonic polyps: Secondary | ICD-10-CM | POA: Diagnosis not present

## 2017-01-23 DIAGNOSIS — Z9889 Other specified postprocedural states: Secondary | ICD-10-CM | POA: Diagnosis not present

## 2017-01-24 ENCOUNTER — Ambulatory Visit: Payer: BLUE CROSS/BLUE SHIELD

## 2017-01-24 ENCOUNTER — Ambulatory Visit
Admission: RE | Admit: 2017-01-24 | Discharge: 2017-01-24 | Disposition: A | Discharge status: Home / Self Care | Payer: BLUE CROSS/BLUE SHIELD | Source: Ambulatory Visit | Attending: Radiation Oncology | Admitting: Radiation Oncology

## 2017-01-24 DIAGNOSIS — Z86010 Personal history of colonic polyps: Secondary | ICD-10-CM | POA: Diagnosis not present

## 2017-01-24 DIAGNOSIS — Z803 Family history of malignant neoplasm of breast: Secondary | ICD-10-CM | POA: Diagnosis not present

## 2017-01-24 DIAGNOSIS — Z823 Family history of stroke: Secondary | ICD-10-CM | POA: Diagnosis not present

## 2017-01-24 DIAGNOSIS — Z800 Family history of malignant neoplasm of digestive organs: Secondary | ICD-10-CM | POA: Diagnosis not present

## 2017-01-24 DIAGNOSIS — Z801 Family history of malignant neoplasm of trachea, bronchus and lung: Secondary | ICD-10-CM | POA: Diagnosis not present

## 2017-01-24 DIAGNOSIS — D869 Sarcoidosis, unspecified: Secondary | ICD-10-CM | POA: Diagnosis not present

## 2017-01-24 DIAGNOSIS — Z888 Allergy status to other drugs, medicaments and biological substances status: Secondary | ICD-10-CM | POA: Diagnosis not present

## 2017-01-24 DIAGNOSIS — C61 Malignant neoplasm of prostate: Secondary | ICD-10-CM | POA: Diagnosis not present

## 2017-01-24 DIAGNOSIS — K219 Gastro-esophageal reflux disease without esophagitis: Secondary | ICD-10-CM | POA: Diagnosis not present

## 2017-01-24 DIAGNOSIS — Z85828 Personal history of other malignant neoplasm of skin: Secondary | ICD-10-CM | POA: Diagnosis not present

## 2017-01-24 DIAGNOSIS — Z8042 Family history of malignant neoplasm of prostate: Secondary | ICD-10-CM | POA: Diagnosis not present

## 2017-01-24 DIAGNOSIS — Z9079 Acquired absence of other genital organ(s): Secondary | ICD-10-CM | POA: Diagnosis not present

## 2017-01-24 DIAGNOSIS — Z9889 Other specified postprocedural states: Secondary | ICD-10-CM | POA: Diagnosis not present

## 2017-01-24 DIAGNOSIS — Z87442 Personal history of urinary calculi: Secondary | ICD-10-CM | POA: Diagnosis not present

## 2017-01-24 DIAGNOSIS — Z510 Encounter for antineoplastic radiation therapy: Secondary | ICD-10-CM | POA: Diagnosis not present

## 2017-01-24 DIAGNOSIS — G629 Polyneuropathy, unspecified: Secondary | ICD-10-CM | POA: Diagnosis not present

## 2017-01-25 ENCOUNTER — Ambulatory Visit: Payer: BLUE CROSS/BLUE SHIELD

## 2017-01-25 ENCOUNTER — Ambulatory Visit
Admission: RE | Admit: 2017-01-25 | Discharge: 2017-01-25 | Disposition: A | Discharge status: Home / Self Care | Payer: BLUE CROSS/BLUE SHIELD | Source: Ambulatory Visit | Attending: Radiation Oncology | Admitting: Radiation Oncology

## 2017-01-25 DIAGNOSIS — Z86010 Personal history of colonic polyps: Secondary | ICD-10-CM | POA: Diagnosis not present

## 2017-01-25 DIAGNOSIS — Z8042 Family history of malignant neoplasm of prostate: Secondary | ICD-10-CM | POA: Diagnosis not present

## 2017-01-25 DIAGNOSIS — Z510 Encounter for antineoplastic radiation therapy: Secondary | ICD-10-CM | POA: Diagnosis not present

## 2017-01-25 DIAGNOSIS — Z801 Family history of malignant neoplasm of trachea, bronchus and lung: Secondary | ICD-10-CM | POA: Diagnosis not present

## 2017-01-25 DIAGNOSIS — Z803 Family history of malignant neoplasm of breast: Secondary | ICD-10-CM | POA: Diagnosis not present

## 2017-01-25 DIAGNOSIS — Z87442 Personal history of urinary calculi: Secondary | ICD-10-CM | POA: Diagnosis not present

## 2017-01-25 DIAGNOSIS — Z85828 Personal history of other malignant neoplasm of skin: Secondary | ICD-10-CM | POA: Diagnosis not present

## 2017-01-25 DIAGNOSIS — G629 Polyneuropathy, unspecified: Secondary | ICD-10-CM | POA: Diagnosis not present

## 2017-01-25 DIAGNOSIS — Z9889 Other specified postprocedural states: Secondary | ICD-10-CM | POA: Diagnosis not present

## 2017-01-25 DIAGNOSIS — Z888 Allergy status to other drugs, medicaments and biological substances status: Secondary | ICD-10-CM | POA: Diagnosis not present

## 2017-01-25 DIAGNOSIS — D869 Sarcoidosis, unspecified: Secondary | ICD-10-CM | POA: Diagnosis not present

## 2017-01-25 DIAGNOSIS — Z823 Family history of stroke: Secondary | ICD-10-CM | POA: Diagnosis not present

## 2017-01-25 DIAGNOSIS — Z9079 Acquired absence of other genital organ(s): Secondary | ICD-10-CM | POA: Diagnosis not present

## 2017-01-25 DIAGNOSIS — C61 Malignant neoplasm of prostate: Secondary | ICD-10-CM | POA: Diagnosis not present

## 2017-01-25 DIAGNOSIS — K219 Gastro-esophageal reflux disease without esophagitis: Secondary | ICD-10-CM | POA: Diagnosis not present

## 2017-01-25 DIAGNOSIS — Z800 Family history of malignant neoplasm of digestive organs: Secondary | ICD-10-CM | POA: Diagnosis not present

## 2017-01-28 ENCOUNTER — Ambulatory Visit: Payer: BLUE CROSS/BLUE SHIELD

## 2017-01-28 ENCOUNTER — Ambulatory Visit
Admission: RE | Admit: 2017-01-28 | Discharge: 2017-01-28 | Disposition: A | Discharge status: Home / Self Care | Payer: BLUE CROSS/BLUE SHIELD | Source: Ambulatory Visit | Attending: Radiation Oncology | Admitting: Radiation Oncology

## 2017-01-28 DIAGNOSIS — Z888 Allergy status to other drugs, medicaments and biological substances status: Secondary | ICD-10-CM | POA: Diagnosis not present

## 2017-01-28 DIAGNOSIS — K219 Gastro-esophageal reflux disease without esophagitis: Secondary | ICD-10-CM | POA: Diagnosis not present

## 2017-01-28 DIAGNOSIS — G629 Polyneuropathy, unspecified: Secondary | ICD-10-CM | POA: Diagnosis not present

## 2017-01-28 DIAGNOSIS — D869 Sarcoidosis, unspecified: Secondary | ICD-10-CM | POA: Diagnosis not present

## 2017-01-28 DIAGNOSIS — Z801 Family history of malignant neoplasm of trachea, bronchus and lung: Secondary | ICD-10-CM | POA: Diagnosis not present

## 2017-01-28 DIAGNOSIS — Z8042 Family history of malignant neoplasm of prostate: Secondary | ICD-10-CM | POA: Diagnosis not present

## 2017-01-28 DIAGNOSIS — Z823 Family history of stroke: Secondary | ICD-10-CM | POA: Diagnosis not present

## 2017-01-28 DIAGNOSIS — Z800 Family history of malignant neoplasm of digestive organs: Secondary | ICD-10-CM | POA: Diagnosis not present

## 2017-01-28 DIAGNOSIS — C61 Malignant neoplasm of prostate: Secondary | ICD-10-CM | POA: Diagnosis not present

## 2017-01-28 DIAGNOSIS — Z9079 Acquired absence of other genital organ(s): Secondary | ICD-10-CM | POA: Diagnosis not present

## 2017-01-28 DIAGNOSIS — Z803 Family history of malignant neoplasm of breast: Secondary | ICD-10-CM | POA: Diagnosis not present

## 2017-01-28 DIAGNOSIS — Z87442 Personal history of urinary calculi: Secondary | ICD-10-CM | POA: Diagnosis not present

## 2017-01-28 DIAGNOSIS — Z9889 Other specified postprocedural states: Secondary | ICD-10-CM | POA: Diagnosis not present

## 2017-01-28 DIAGNOSIS — Z86010 Personal history of colonic polyps: Secondary | ICD-10-CM | POA: Diagnosis not present

## 2017-01-28 DIAGNOSIS — Z510 Encounter for antineoplastic radiation therapy: Secondary | ICD-10-CM | POA: Diagnosis not present

## 2017-01-28 DIAGNOSIS — Z85828 Personal history of other malignant neoplasm of skin: Secondary | ICD-10-CM | POA: Diagnosis not present

## 2017-01-29 ENCOUNTER — Ambulatory Visit
Admission: RE | Admit: 2017-01-29 | Discharge: 2017-01-29 | Disposition: A | Discharge status: Home / Self Care | Payer: BLUE CROSS/BLUE SHIELD | Source: Ambulatory Visit | Attending: Radiation Oncology | Admitting: Radiation Oncology

## 2017-01-29 ENCOUNTER — Ambulatory Visit: Payer: BLUE CROSS/BLUE SHIELD

## 2017-01-29 DIAGNOSIS — Z800 Family history of malignant neoplasm of digestive organs: Secondary | ICD-10-CM | POA: Diagnosis not present

## 2017-01-29 DIAGNOSIS — Z801 Family history of malignant neoplasm of trachea, bronchus and lung: Secondary | ICD-10-CM | POA: Diagnosis not present

## 2017-01-29 DIAGNOSIS — Z85828 Personal history of other malignant neoplasm of skin: Secondary | ICD-10-CM | POA: Diagnosis not present

## 2017-01-29 DIAGNOSIS — K219 Gastro-esophageal reflux disease without esophagitis: Secondary | ICD-10-CM | POA: Diagnosis not present

## 2017-01-29 DIAGNOSIS — Z803 Family history of malignant neoplasm of breast: Secondary | ICD-10-CM | POA: Diagnosis not present

## 2017-01-29 DIAGNOSIS — D869 Sarcoidosis, unspecified: Secondary | ICD-10-CM | POA: Diagnosis not present

## 2017-01-29 DIAGNOSIS — Z510 Encounter for antineoplastic radiation therapy: Secondary | ICD-10-CM | POA: Diagnosis not present

## 2017-01-29 DIAGNOSIS — Z8042 Family history of malignant neoplasm of prostate: Secondary | ICD-10-CM | POA: Diagnosis not present

## 2017-01-29 DIAGNOSIS — Z87442 Personal history of urinary calculi: Secondary | ICD-10-CM | POA: Diagnosis not present

## 2017-01-29 DIAGNOSIS — Z9889 Other specified postprocedural states: Secondary | ICD-10-CM | POA: Diagnosis not present

## 2017-01-29 DIAGNOSIS — G629 Polyneuropathy, unspecified: Secondary | ICD-10-CM | POA: Diagnosis not present

## 2017-01-29 DIAGNOSIS — Z86010 Personal history of colonic polyps: Secondary | ICD-10-CM | POA: Diagnosis not present

## 2017-01-29 DIAGNOSIS — Z888 Allergy status to other drugs, medicaments and biological substances status: Secondary | ICD-10-CM | POA: Diagnosis not present

## 2017-01-29 DIAGNOSIS — Z823 Family history of stroke: Secondary | ICD-10-CM | POA: Diagnosis not present

## 2017-01-29 DIAGNOSIS — C61 Malignant neoplasm of prostate: Secondary | ICD-10-CM | POA: Diagnosis not present

## 2017-01-29 DIAGNOSIS — Z9079 Acquired absence of other genital organ(s): Secondary | ICD-10-CM | POA: Diagnosis not present

## 2017-01-30 ENCOUNTER — Ambulatory Visit: Payer: BLUE CROSS/BLUE SHIELD

## 2017-01-30 ENCOUNTER — Encounter: Payer: Self-pay | Admitting: Adult Health

## 2017-01-30 ENCOUNTER — Ambulatory Visit (INDEPENDENT_AMBULATORY_CARE_PROVIDER_SITE_OTHER): Payer: BLUE CROSS/BLUE SHIELD | Admitting: Adult Health

## 2017-01-30 ENCOUNTER — Ambulatory Visit: Payer: BLUE CROSS/BLUE SHIELD | Admitting: Nurse Practitioner

## 2017-01-30 ENCOUNTER — Ambulatory Visit
Admission: RE | Admit: 2017-01-30 | Discharge: 2017-01-30 | Disposition: A | Discharge status: Home / Self Care | Payer: BLUE CROSS/BLUE SHIELD | Source: Ambulatory Visit | Attending: Radiation Oncology | Admitting: Radiation Oncology

## 2017-01-30 VITALS — BP 116/78 | HR 57 | Ht 70.0 in | Wt 173.0 lb

## 2017-01-30 DIAGNOSIS — Z86010 Personal history of colonic polyps: Secondary | ICD-10-CM | POA: Diagnosis not present

## 2017-01-30 DIAGNOSIS — R202 Paresthesia of skin: Secondary | ICD-10-CM

## 2017-01-30 DIAGNOSIS — Z800 Family history of malignant neoplasm of digestive organs: Secondary | ICD-10-CM | POA: Diagnosis not present

## 2017-01-30 DIAGNOSIS — Z87442 Personal history of urinary calculi: Secondary | ICD-10-CM | POA: Diagnosis not present

## 2017-01-30 DIAGNOSIS — Z9889 Other specified postprocedural states: Secondary | ICD-10-CM | POA: Diagnosis not present

## 2017-01-30 DIAGNOSIS — Z888 Allergy status to other drugs, medicaments and biological substances status: Secondary | ICD-10-CM | POA: Diagnosis not present

## 2017-01-30 DIAGNOSIS — C61 Malignant neoplasm of prostate: Secondary | ICD-10-CM | POA: Diagnosis not present

## 2017-01-30 DIAGNOSIS — Z823 Family history of stroke: Secondary | ICD-10-CM | POA: Diagnosis not present

## 2017-01-30 DIAGNOSIS — Z8042 Family history of malignant neoplasm of prostate: Secondary | ICD-10-CM | POA: Diagnosis not present

## 2017-01-30 DIAGNOSIS — D869 Sarcoidosis, unspecified: Secondary | ICD-10-CM | POA: Diagnosis not present

## 2017-01-30 DIAGNOSIS — Z510 Encounter for antineoplastic radiation therapy: Secondary | ICD-10-CM | POA: Diagnosis not present

## 2017-01-30 DIAGNOSIS — Z85828 Personal history of other malignant neoplasm of skin: Secondary | ICD-10-CM | POA: Diagnosis not present

## 2017-01-30 DIAGNOSIS — G629 Polyneuropathy, unspecified: Secondary | ICD-10-CM | POA: Diagnosis not present

## 2017-01-30 DIAGNOSIS — Z801 Family history of malignant neoplasm of trachea, bronchus and lung: Secondary | ICD-10-CM | POA: Diagnosis not present

## 2017-01-30 DIAGNOSIS — Z9079 Acquired absence of other genital organ(s): Secondary | ICD-10-CM | POA: Diagnosis not present

## 2017-01-30 DIAGNOSIS — K219 Gastro-esophageal reflux disease without esophagitis: Secondary | ICD-10-CM | POA: Diagnosis not present

## 2017-01-30 DIAGNOSIS — Z803 Family history of malignant neoplasm of breast: Secondary | ICD-10-CM | POA: Diagnosis not present

## 2017-01-30 NOTE — Progress Notes (Signed)
PATIENT: Philip Hernandez DOB: [birth year]  REASON FOR VISIT: follow up- paresthesia HISTORY FROM: patient  HISTORY OF PRESENT ILLNESS: Today 01/30/2017: Mr. Bueche is a 61 year old male with a history of paresthesias in the feet. He returns today for follow-up. He continues on gabapentin 800 mg 4 times a day. He was given a prescription of Topamax to take 25 mg twice a day. He reports that he has not started this. States that he found out that his prostate cancer has returned. He started radiation treatment April 30 and this will continue until June 21. He states that he may consider starting this after his radiation is completed. He states that he still has burning and tingling sensation in the feet and legs. He states he recently he's been having a throbbing sensation on the top of the right foot that can last approximately 1 minute. He states this is not consistent and does not occur daily. He denies any significant changes with his gait or balance. Reports that at times  he is very staggery but no falls. He also reports numbness in the fingertips and will occasionally drop things. He returns today for an evaluation.          Update 10/30/2016 ; he returns for follow-up last visit 6 months ago with [name], nurse practitioner. He states the paresthesias in his hands may be getting worse. He has occasional cramps as well as some sharp shooting pains. In fact drops objects from his hand and has particular difficulty time holding onto light objects but can grab heavy objects quite well. Occasionally staggers a lot but has had no falls or injuries. He is currently on gabapentin 800 mg 4 times daily but does complain of excessive sleepiness as well as some dizziness. He has not tried Topamax and other medications for paresthesias in the past.   REVIEW OF SYSTEMS: Out of a complete 14 system review of symptoms, the patient complains only of the following symptoms, and all other reviewed systems are  negative.  Fatigue, hearing loss, runny nose, eye itching, eye redness, numbness, weakness   ALLERGIES: Allergies  Allergen Reactions  . Corn-Containing Products     HOME MEDICATIONS: Outpatient Medications Prior to Visit  Medication Sig Dispense Refill  . acetaminophen (TYLENOL) 500 MG tablet Take 500 mg by mouth every 6 (six) hours as needed.    . benzonatate (TESSALON PERLES) 100 MG capsule Take 1 capsule (100 mg total) by mouth 3 (three) times daily as needed for cough. 20 capsule 0  . cetirizine (ZYRTEC) 10 MG tablet Take 10 mg by mouth daily.    . cholecalciferol (VITAMIN D) 1000 UNITS tablet Take 1,000 Units by mouth daily.      Marland Kitchen gabapentin (NEURONTIN) 800 MG tablet TAKE 1 TABLET BY MOUTH 4  TIMES DAILY 360 tablet 3  . Multiple Vitamins-Minerals (AIRBORNE PO) Take by mouth daily.    Marland Kitchen omeprazole (PRILOSEC) 40 MG capsule TAKE ONE (1) CAPSULE EACH DAY 30 capsule 5  . topiramate (TOPAMAX) 25 MG tablet Take 1 tablet (25 mg total) by mouth 2 (two) times daily. (Patient not taking: Reported on 12/12/2016) 120 tablet 3   No facility-administered medications prior to visit.     PAST MEDICAL HISTORY: Past Medical History:  Diagnosis Date  . Colon polyp   . Colon polyps 2015  . Cough    chronic sinus issues, GERD with poor dietary control and Pulmonary Sarcoid.   Marland Kitchen GERD (gastroesophageal reflux disease) Jan 2012  [name]. Bx proven.  Deve abd pain in Jan 2012 following high dose steroids for sarcoid. s/p EGD and improvement in symptoms following PPI  . Hypogonadism male   . Kidney stone   . Malignant neoplasm of prostate [facility]) dec 2010   [name]. s/p surgery dec 2010, hx of rnormal PSA  since then  . Other and unspecified mycoses   . Painful respiration   . Peripheral neuropathy   . Prostate cancer ([place])    reoccurence (getting radiation 01-14-17 start).  . Sarcoidosis dec 2011   stage 2 pulmonary, neck nodes, splenic granuloma  . Skin cancer   . Swelling, mass,  or lump in head and neck     PAST SURGICAL HISTORY: Past Surgical History:  Procedure Laterality Date  . HERNIA REPAIR  13 yrs ago  . LYMPH NODE BIOPSY  05/2010, 07/2010  . nasal polyps  1984  . NASAL SINUS SURGERY  2013  . PROSTATE SURGERY  2010  . SHOULDER SURGERY Right 2009, 2010   x 2  . SKIN CANCER EXCISION  11/16/14    FAMILY HISTORY: Family History  Problem Relation Age of Onset  . Colon cancer Mother 58  . Skin cancer Mother   . Cancer Mother        colon  . Colon cancer Brother 50  . Skin cancer Brother   . Cancer Brother        colon  . Colon cancer Maternal Aunt        dx in her 13s  . Cancer Maternal Aunt        "cancer of male organs" dx in her 39s  . Breast cancer Maternal Aunt 66       bilateral, dx again at 37  . Colon cancer Maternal Aunt 29  . Stomach cancer Maternal Aunt 68  . Stomach cancer Maternal Uncle 54  . Skin cancer Paternal Aunt   . Prostate cancer Paternal Uncle 56  . Cancer Paternal Uncle        prostate  . Stomach cancer Paternal Grandmother        dx in her 77s  . Stroke Paternal Grandfather   . Colon cancer Maternal Aunt 68  . Prostate cancer Paternal Uncle   . Cancer Paternal Uncle        prostate  . Prostate cancer Paternal Uncle   . Kidney cancer Paternal Uncle   . Lung cancer Paternal Uncle   . Cancer Paternal Uncle        prostate  . Lung cancer Paternal Uncle   . Cancer Paternal Uncle        unknown  . Cancer Paternal Uncle        prostate  . Cancer Cousin        paternal cousin with cancer NOS  . Cancer Cousin        paternal cousin with cancer NOS    SOCIAL HISTORY: Social History   Social History  . Marital status: Married    Spouse name: N/A  . Number of children: 2  . Years of education: N/A   Occupational History  . unemployed    Social History Main Topics  . Smoking status: Never Smoker  . Smokeless tobacco: Never Used  . Alcohol use No  . Drug use: No  . Sexual activity: Yes   Other Topics  Concern  . Not on file   Social History Narrative   Married   2 children  PHYSICAL EXAM  Vitals:   01/30/17 0916  BP: 116/78  Pulse: (!) 57  Weight: 173 lb (78.5 kg)  Height: 5\' 10"  (1.778 m)   Body mass index is 24.82 kg/m.  Generalized: Well developed, in no acute distress   Neurological examination  Mentation: Alert oriented to time, place, history taking. Follows all commands speech and language fluent Cranial nerve II-XII: Pupils were equal round reactive to light. Extraocular movements were full, visual field were full on confrontational test. Facial sensation and strength were normal. Uvula tongue midline. Head turning and shoulder shrug  were normal and symmetric. Motor: The motor testing reveals 5 over 5 strength of all 4 extremities. Good symmetric motor tone is noted throughout.  Sensory: Sensory testing is intact to soft touch on all 4 extremities. No evidence of extinction is noted.  Coordination: Cerebellar testing reveals good finger-nose-finger and heel-to-shin bilaterally.  Gait and station: Gait is normal. Tandem gait is unsteady. Romberg is negative. No drift is seen.  Reflexes: Deep tendon reflexes are symmetric and normal bilaterally.   DIAGNOSTIC DATA (LABS, IMAGING, TESTING) - I reviewed patient records, labs, notes, testing and imaging myself where available.  Lab Results  Component Value Date   WBC 7.7 11/30/2016   HGB 12.0 (L) 12/10/2011   HCT 40.7 11/30/2016   MCV 84 11/30/2016   PLT 142 (L) 11/30/2016      Component Value Date/Time   NA 143 11/30/2016 1421   K 4.7 11/30/2016 1421   CL 103 11/30/2016 1421   CO2 27 11/30/2016 1421   GLUCOSE 73 11/30/2016 1421   GLUCOSE 80 03/26/2013 1220   BUN 18 11/30/2016 1421   CREATININE 1.20 11/30/2016 1421   CREATININE 1.15 03/26/2013 1220   CALCIUM 9.3 11/30/2016 1421   PROT 7.0 11/30/2016 1421   ALBUMIN 4.3 11/30/2016 1421   AST 22 11/30/2016 1421   ALT 18 11/30/2016 1421   ALKPHOS 71  11/30/2016 1421   BILITOT 1.2 11/30/2016 1421   GFRNONAA 66 11/30/2016 1421   GFRNONAA 71 03/26/2013 1220   GFRAA 76 11/30/2016 1421   GFRAA 82 03/26/2013 1220   Lab Results  Component Value Date   CHOL 183 11/30/2016   HDL 38 (L) 11/30/2016   LDLCALC 120 (H) 11/30/2016   TRIG 125 11/30/2016   CHOLHDL 4.8 11/30/2016   No results found for: HGBA1C No results found for: VITAMINB12 Lab Results  Component Value Date   TSH 2.428 12/08/2012      ASSESSMENT AND PLAN 61 y.o. year old male  has a past medical history of Colon polyp; Colon polyps (2015); Cough; GERD (gastroesophageal reflux disease) (Jan 2012); Hypogonadism male; Kidney stone; Malignant neoplasm of prostate ([place]) (dec 2010); Other and unspecified mycoses; Painful respiration; Peripheral neuropathy; Prostate cancer ([place]); Sarcoidosis (dec 2011); Skin cancer; and Swelling, mass, or lump in head and neck. here with :  1. Paresthesias in the feet  Overall the patient has remained stable. He will continue on gabapentin 800 mg 4 times a day. He states that he will start Topamax 25 mg twice a day once he finishes radiation. He feels like the pain is tolerable until then. He is advised that if his symptoms worsen or he develops new symptoms he will let us know. He will follow-up in 6 months or sooner if needed.  I spent 15 minutes with the patient 50% of this time was spent counseling the patient on his medication.   [name], MSN, NP-C 01/30/2017, 9:36 AM [facility]  [address], [address] [place], [place] [address] [phone]

## 2017-01-30 NOTE — Patient Instructions (Signed)
Continue Gabapentin Try Topamax once you finish radiation If your symptoms worsen or you develop new symptoms please let us know.

## 2017-01-30 NOTE — Progress Notes (Signed)
I have read the note, and I agree with the clinical assessment and plan.  Manroop Jakubowicz A. Verita Kuroda, MD, PhD Certified in Neurology, Clinical Neurophysiology, Sleep Medicine, Pain Medicine and Neuroimaging  [facility] [address], [address] [place], [place] [address] [phone]  

## 2017-01-31 ENCOUNTER — Ambulatory Visit: Payer: BLUE CROSS/BLUE SHIELD

## 2017-01-31 ENCOUNTER — Ambulatory Visit
Admission: RE | Admit: 2017-01-31 | Discharge: 2017-01-31 | Disposition: A | Discharge status: Home / Self Care | Payer: BLUE CROSS/BLUE SHIELD | Source: Ambulatory Visit | Attending: Radiation Oncology | Admitting: Radiation Oncology

## 2017-01-31 DIAGNOSIS — D869 Sarcoidosis, unspecified: Secondary | ICD-10-CM | POA: Diagnosis not present

## 2017-01-31 DIAGNOSIS — C61 Malignant neoplasm of prostate: Secondary | ICD-10-CM | POA: Diagnosis not present

## 2017-01-31 DIAGNOSIS — Z87442 Personal history of urinary calculi: Secondary | ICD-10-CM | POA: Diagnosis not present

## 2017-01-31 DIAGNOSIS — Z9889 Other specified postprocedural states: Secondary | ICD-10-CM | POA: Diagnosis not present

## 2017-01-31 DIAGNOSIS — Z803 Family history of malignant neoplasm of breast: Secondary | ICD-10-CM | POA: Diagnosis not present

## 2017-01-31 DIAGNOSIS — G629 Polyneuropathy, unspecified: Secondary | ICD-10-CM | POA: Diagnosis not present

## 2017-01-31 DIAGNOSIS — Z510 Encounter for antineoplastic radiation therapy: Secondary | ICD-10-CM | POA: Diagnosis not present

## 2017-01-31 DIAGNOSIS — Z86010 Personal history of colonic polyps: Secondary | ICD-10-CM | POA: Diagnosis not present

## 2017-01-31 DIAGNOSIS — Z823 Family history of stroke: Secondary | ICD-10-CM | POA: Diagnosis not present

## 2017-01-31 DIAGNOSIS — Z85828 Personal history of other malignant neoplasm of skin: Secondary | ICD-10-CM | POA: Diagnosis not present

## 2017-01-31 DIAGNOSIS — Z8042 Family history of malignant neoplasm of prostate: Secondary | ICD-10-CM | POA: Diagnosis not present

## 2017-01-31 DIAGNOSIS — Z888 Allergy status to other drugs, medicaments and biological substances status: Secondary | ICD-10-CM | POA: Diagnosis not present

## 2017-01-31 DIAGNOSIS — Z9079 Acquired absence of other genital organ(s): Secondary | ICD-10-CM | POA: Diagnosis not present

## 2017-01-31 DIAGNOSIS — Z800 Family history of malignant neoplasm of digestive organs: Secondary | ICD-10-CM | POA: Diagnosis not present

## 2017-01-31 DIAGNOSIS — Z801 Family history of malignant neoplasm of trachea, bronchus and lung: Secondary | ICD-10-CM | POA: Diagnosis not present

## 2017-01-31 DIAGNOSIS — K219 Gastro-esophageal reflux disease without esophagitis: Secondary | ICD-10-CM | POA: Diagnosis not present

## 2017-02-01 ENCOUNTER — Ambulatory Visit: Payer: BLUE CROSS/BLUE SHIELD

## 2017-02-01 ENCOUNTER — Ambulatory Visit
Admission: RE | Admit: 2017-02-01 | Discharge: 2017-02-01 | Disposition: A | Discharge status: Home / Self Care | Payer: BLUE CROSS/BLUE SHIELD | Source: Ambulatory Visit | Attending: Radiation Oncology | Admitting: Radiation Oncology

## 2017-02-01 DIAGNOSIS — C61 Malignant neoplasm of prostate: Secondary | ICD-10-CM | POA: Diagnosis not present

## 2017-02-01 DIAGNOSIS — K219 Gastro-esophageal reflux disease without esophagitis: Secondary | ICD-10-CM | POA: Diagnosis not present

## 2017-02-01 DIAGNOSIS — Z510 Encounter for antineoplastic radiation therapy: Secondary | ICD-10-CM | POA: Diagnosis not present

## 2017-02-01 DIAGNOSIS — Z803 Family history of malignant neoplasm of breast: Secondary | ICD-10-CM | POA: Diagnosis not present

## 2017-02-01 DIAGNOSIS — D869 Sarcoidosis, unspecified: Secondary | ICD-10-CM | POA: Diagnosis not present

## 2017-02-01 DIAGNOSIS — Z823 Family history of stroke: Secondary | ICD-10-CM | POA: Diagnosis not present

## 2017-02-01 DIAGNOSIS — Z86010 Personal history of colonic polyps: Secondary | ICD-10-CM | POA: Diagnosis not present

## 2017-02-01 DIAGNOSIS — Z87442 Personal history of urinary calculi: Secondary | ICD-10-CM | POA: Diagnosis not present

## 2017-02-01 DIAGNOSIS — Z801 Family history of malignant neoplasm of trachea, bronchus and lung: Secondary | ICD-10-CM | POA: Diagnosis not present

## 2017-02-01 DIAGNOSIS — Z9079 Acquired absence of other genital organ(s): Secondary | ICD-10-CM | POA: Diagnosis not present

## 2017-02-01 DIAGNOSIS — Z85828 Personal history of other malignant neoplasm of skin: Secondary | ICD-10-CM | POA: Diagnosis not present

## 2017-02-01 DIAGNOSIS — Z888 Allergy status to other drugs, medicaments and biological substances status: Secondary | ICD-10-CM | POA: Diagnosis not present

## 2017-02-01 DIAGNOSIS — Z8042 Family history of malignant neoplasm of prostate: Secondary | ICD-10-CM | POA: Diagnosis not present

## 2017-02-01 DIAGNOSIS — Z9889 Other specified postprocedural states: Secondary | ICD-10-CM | POA: Diagnosis not present

## 2017-02-01 DIAGNOSIS — G629 Polyneuropathy, unspecified: Secondary | ICD-10-CM | POA: Diagnosis not present

## 2017-02-01 DIAGNOSIS — Z800 Family history of malignant neoplasm of digestive organs: Secondary | ICD-10-CM | POA: Diagnosis not present

## 2017-02-04 ENCOUNTER — Ambulatory Visit: Payer: BLUE CROSS/BLUE SHIELD

## 2017-02-04 ENCOUNTER — Ambulatory Visit
Admission: RE | Admit: 2017-02-04 | Discharge: 2017-02-04 | Disposition: A | Discharge status: Home / Self Care | Payer: BLUE CROSS/BLUE SHIELD | Source: Ambulatory Visit | Attending: Radiation Oncology | Admitting: Radiation Oncology

## 2017-02-04 DIAGNOSIS — C61 Malignant neoplasm of prostate: Secondary | ICD-10-CM | POA: Diagnosis not present

## 2017-02-04 DIAGNOSIS — Z9079 Acquired absence of other genital organ(s): Secondary | ICD-10-CM | POA: Diagnosis not present

## 2017-02-04 DIAGNOSIS — Z85828 Personal history of other malignant neoplasm of skin: Secondary | ICD-10-CM | POA: Diagnosis not present

## 2017-02-04 DIAGNOSIS — Z823 Family history of stroke: Secondary | ICD-10-CM | POA: Diagnosis not present

## 2017-02-04 DIAGNOSIS — G629 Polyneuropathy, unspecified: Secondary | ICD-10-CM | POA: Diagnosis not present

## 2017-02-04 DIAGNOSIS — Z86010 Personal history of colonic polyps: Secondary | ICD-10-CM | POA: Diagnosis not present

## 2017-02-04 DIAGNOSIS — Z803 Family history of malignant neoplasm of breast: Secondary | ICD-10-CM | POA: Diagnosis not present

## 2017-02-04 DIAGNOSIS — Z510 Encounter for antineoplastic radiation therapy: Secondary | ICD-10-CM | POA: Diagnosis not present

## 2017-02-04 DIAGNOSIS — Z800 Family history of malignant neoplasm of digestive organs: Secondary | ICD-10-CM | POA: Diagnosis not present

## 2017-02-04 DIAGNOSIS — Z87442 Personal history of urinary calculi: Secondary | ICD-10-CM | POA: Diagnosis not present

## 2017-02-04 DIAGNOSIS — Z8042 Family history of malignant neoplasm of prostate: Secondary | ICD-10-CM | POA: Diagnosis not present

## 2017-02-04 DIAGNOSIS — K219 Gastro-esophageal reflux disease without esophagitis: Secondary | ICD-10-CM | POA: Diagnosis not present

## 2017-02-04 DIAGNOSIS — Z888 Allergy status to other drugs, medicaments and biological substances status: Secondary | ICD-10-CM | POA: Diagnosis not present

## 2017-02-04 DIAGNOSIS — D869 Sarcoidosis, unspecified: Secondary | ICD-10-CM | POA: Diagnosis not present

## 2017-02-04 DIAGNOSIS — Z801 Family history of malignant neoplasm of trachea, bronchus and lung: Secondary | ICD-10-CM | POA: Diagnosis not present

## 2017-02-04 DIAGNOSIS — Z9889 Other specified postprocedural states: Secondary | ICD-10-CM | POA: Diagnosis not present

## 2017-02-05 ENCOUNTER — Ambulatory Visit: Payer: BLUE CROSS/BLUE SHIELD

## 2017-02-05 ENCOUNTER — Ambulatory Visit
Admission: RE | Admit: 2017-02-05 | Discharge: 2017-02-05 | Disposition: A | Discharge status: Home / Self Care | Payer: BLUE CROSS/BLUE SHIELD | Source: Ambulatory Visit | Attending: Radiation Oncology | Admitting: Radiation Oncology

## 2017-02-05 DIAGNOSIS — Z823 Family history of stroke: Secondary | ICD-10-CM | POA: Diagnosis not present

## 2017-02-05 DIAGNOSIS — Z888 Allergy status to other drugs, medicaments and biological substances status: Secondary | ICD-10-CM | POA: Diagnosis not present

## 2017-02-05 DIAGNOSIS — D869 Sarcoidosis, unspecified: Secondary | ICD-10-CM | POA: Diagnosis not present

## 2017-02-05 DIAGNOSIS — Z85828 Personal history of other malignant neoplasm of skin: Secondary | ICD-10-CM | POA: Diagnosis not present

## 2017-02-05 DIAGNOSIS — Z800 Family history of malignant neoplasm of digestive organs: Secondary | ICD-10-CM | POA: Diagnosis not present

## 2017-02-05 DIAGNOSIS — Z87442 Personal history of urinary calculi: Secondary | ICD-10-CM | POA: Diagnosis not present

## 2017-02-05 DIAGNOSIS — Z510 Encounter for antineoplastic radiation therapy: Secondary | ICD-10-CM | POA: Diagnosis not present

## 2017-02-05 DIAGNOSIS — Z8042 Family history of malignant neoplasm of prostate: Secondary | ICD-10-CM | POA: Diagnosis not present

## 2017-02-05 DIAGNOSIS — K219 Gastro-esophageal reflux disease without esophagitis: Secondary | ICD-10-CM | POA: Diagnosis not present

## 2017-02-05 DIAGNOSIS — Z86010 Personal history of colonic polyps: Secondary | ICD-10-CM | POA: Diagnosis not present

## 2017-02-05 DIAGNOSIS — Z801 Family history of malignant neoplasm of trachea, bronchus and lung: Secondary | ICD-10-CM | POA: Diagnosis not present

## 2017-02-05 DIAGNOSIS — Z9889 Other specified postprocedural states: Secondary | ICD-10-CM | POA: Diagnosis not present

## 2017-02-05 DIAGNOSIS — Z803 Family history of malignant neoplasm of breast: Secondary | ICD-10-CM | POA: Diagnosis not present

## 2017-02-05 DIAGNOSIS — G629 Polyneuropathy, unspecified: Secondary | ICD-10-CM | POA: Diagnosis not present

## 2017-02-05 DIAGNOSIS — Z9079 Acquired absence of other genital organ(s): Secondary | ICD-10-CM | POA: Diagnosis not present

## 2017-02-05 DIAGNOSIS — C61 Malignant neoplasm of prostate: Secondary | ICD-10-CM | POA: Diagnosis not present

## 2017-02-06 ENCOUNTER — Ambulatory Visit
Admission: RE | Admit: 2017-02-06 | Discharge: 2017-02-06 | Disposition: A | Discharge status: Home / Self Care | Payer: BLUE CROSS/BLUE SHIELD | Source: Ambulatory Visit | Attending: Radiation Oncology | Admitting: Radiation Oncology

## 2017-02-06 ENCOUNTER — Ambulatory Visit: Payer: BLUE CROSS/BLUE SHIELD

## 2017-02-06 DIAGNOSIS — Z9889 Other specified postprocedural states: Secondary | ICD-10-CM | POA: Diagnosis not present

## 2017-02-06 DIAGNOSIS — K219 Gastro-esophageal reflux disease without esophagitis: Secondary | ICD-10-CM | POA: Diagnosis not present

## 2017-02-06 DIAGNOSIS — Z823 Family history of stroke: Secondary | ICD-10-CM | POA: Diagnosis not present

## 2017-02-06 DIAGNOSIS — Z87442 Personal history of urinary calculi: Secondary | ICD-10-CM | POA: Diagnosis not present

## 2017-02-06 DIAGNOSIS — Z9079 Acquired absence of other genital organ(s): Secondary | ICD-10-CM | POA: Diagnosis not present

## 2017-02-06 DIAGNOSIS — C61 Malignant neoplasm of prostate: Secondary | ICD-10-CM | POA: Diagnosis not present

## 2017-02-06 DIAGNOSIS — Z86010 Personal history of colonic polyps: Secondary | ICD-10-CM | POA: Diagnosis not present

## 2017-02-06 DIAGNOSIS — Z8042 Family history of malignant neoplasm of prostate: Secondary | ICD-10-CM | POA: Diagnosis not present

## 2017-02-06 DIAGNOSIS — G629 Polyneuropathy, unspecified: Secondary | ICD-10-CM | POA: Diagnosis not present

## 2017-02-06 DIAGNOSIS — Z888 Allergy status to other drugs, medicaments and biological substances status: Secondary | ICD-10-CM | POA: Diagnosis not present

## 2017-02-06 DIAGNOSIS — Z510 Encounter for antineoplastic radiation therapy: Secondary | ICD-10-CM | POA: Diagnosis not present

## 2017-02-06 DIAGNOSIS — Z803 Family history of malignant neoplasm of breast: Secondary | ICD-10-CM | POA: Diagnosis not present

## 2017-02-06 DIAGNOSIS — Z800 Family history of malignant neoplasm of digestive organs: Secondary | ICD-10-CM | POA: Diagnosis not present

## 2017-02-06 DIAGNOSIS — Z85828 Personal history of other malignant neoplasm of skin: Secondary | ICD-10-CM | POA: Diagnosis not present

## 2017-02-06 DIAGNOSIS — D869 Sarcoidosis, unspecified: Secondary | ICD-10-CM | POA: Diagnosis not present

## 2017-02-06 DIAGNOSIS — Z801 Family history of malignant neoplasm of trachea, bronchus and lung: Secondary | ICD-10-CM | POA: Diagnosis not present

## 2017-02-07 ENCOUNTER — Ambulatory Visit: Payer: BLUE CROSS/BLUE SHIELD

## 2017-02-07 ENCOUNTER — Ambulatory Visit
Admission: RE | Admit: 2017-02-07 | Discharge: 2017-02-07 | Disposition: A | Discharge status: Home / Self Care | Payer: BLUE CROSS/BLUE SHIELD | Source: Ambulatory Visit | Attending: Radiation Oncology | Admitting: Radiation Oncology

## 2017-02-07 DIAGNOSIS — K219 Gastro-esophageal reflux disease without esophagitis: Secondary | ICD-10-CM | POA: Diagnosis not present

## 2017-02-07 DIAGNOSIS — Z510 Encounter for antineoplastic radiation therapy: Secondary | ICD-10-CM | POA: Diagnosis not present

## 2017-02-07 DIAGNOSIS — D869 Sarcoidosis, unspecified: Secondary | ICD-10-CM | POA: Diagnosis not present

## 2017-02-07 DIAGNOSIS — Z9889 Other specified postprocedural states: Secondary | ICD-10-CM | POA: Diagnosis not present

## 2017-02-07 DIAGNOSIS — Z823 Family history of stroke: Secondary | ICD-10-CM | POA: Diagnosis not present

## 2017-02-07 DIAGNOSIS — Z85828 Personal history of other malignant neoplasm of skin: Secondary | ICD-10-CM | POA: Diagnosis not present

## 2017-02-07 DIAGNOSIS — Z801 Family history of malignant neoplasm of trachea, bronchus and lung: Secondary | ICD-10-CM | POA: Diagnosis not present

## 2017-02-07 DIAGNOSIS — Z86010 Personal history of colonic polyps: Secondary | ICD-10-CM | POA: Diagnosis not present

## 2017-02-07 DIAGNOSIS — Z87442 Personal history of urinary calculi: Secondary | ICD-10-CM | POA: Diagnosis not present

## 2017-02-07 DIAGNOSIS — G629 Polyneuropathy, unspecified: Secondary | ICD-10-CM | POA: Diagnosis not present

## 2017-02-07 DIAGNOSIS — Z9079 Acquired absence of other genital organ(s): Secondary | ICD-10-CM | POA: Diagnosis not present

## 2017-02-07 DIAGNOSIS — Z803 Family history of malignant neoplasm of breast: Secondary | ICD-10-CM | POA: Diagnosis not present

## 2017-02-07 DIAGNOSIS — Z8042 Family history of malignant neoplasm of prostate: Secondary | ICD-10-CM | POA: Diagnosis not present

## 2017-02-07 DIAGNOSIS — Z888 Allergy status to other drugs, medicaments and biological substances status: Secondary | ICD-10-CM | POA: Diagnosis not present

## 2017-02-07 DIAGNOSIS — Z800 Family history of malignant neoplasm of digestive organs: Secondary | ICD-10-CM | POA: Diagnosis not present

## 2017-02-07 DIAGNOSIS — C61 Malignant neoplasm of prostate: Secondary | ICD-10-CM | POA: Diagnosis not present

## 2017-02-08 ENCOUNTER — Ambulatory Visit
Admission: RE | Admit: 2017-02-08 | Discharge: 2017-02-08 | Disposition: A | Discharge status: Home / Self Care | Payer: BLUE CROSS/BLUE SHIELD | Source: Ambulatory Visit | Attending: Radiation Oncology | Admitting: Radiation Oncology

## 2017-02-08 ENCOUNTER — Ambulatory Visit: Payer: BLUE CROSS/BLUE SHIELD

## 2017-02-08 DIAGNOSIS — G629 Polyneuropathy, unspecified: Secondary | ICD-10-CM | POA: Diagnosis not present

## 2017-02-08 DIAGNOSIS — Z85828 Personal history of other malignant neoplasm of skin: Secondary | ICD-10-CM | POA: Diagnosis not present

## 2017-02-08 DIAGNOSIS — Z823 Family history of stroke: Secondary | ICD-10-CM | POA: Diagnosis not present

## 2017-02-08 DIAGNOSIS — Z800 Family history of malignant neoplasm of digestive organs: Secondary | ICD-10-CM | POA: Diagnosis not present

## 2017-02-08 DIAGNOSIS — C61 Malignant neoplasm of prostate: Secondary | ICD-10-CM | POA: Diagnosis not present

## 2017-02-08 DIAGNOSIS — Z9889 Other specified postprocedural states: Secondary | ICD-10-CM | POA: Diagnosis not present

## 2017-02-08 DIAGNOSIS — Z87442 Personal history of urinary calculi: Secondary | ICD-10-CM | POA: Diagnosis not present

## 2017-02-08 DIAGNOSIS — Z9079 Acquired absence of other genital organ(s): Secondary | ICD-10-CM | POA: Diagnosis not present

## 2017-02-08 DIAGNOSIS — D869 Sarcoidosis, unspecified: Secondary | ICD-10-CM | POA: Diagnosis not present

## 2017-02-08 DIAGNOSIS — Z510 Encounter for antineoplastic radiation therapy: Secondary | ICD-10-CM | POA: Diagnosis not present

## 2017-02-08 DIAGNOSIS — Z8042 Family history of malignant neoplasm of prostate: Secondary | ICD-10-CM | POA: Diagnosis not present

## 2017-02-08 DIAGNOSIS — Z803 Family history of malignant neoplasm of breast: Secondary | ICD-10-CM | POA: Diagnosis not present

## 2017-02-08 DIAGNOSIS — Z801 Family history of malignant neoplasm of trachea, bronchus and lung: Secondary | ICD-10-CM | POA: Diagnosis not present

## 2017-02-08 DIAGNOSIS — K219 Gastro-esophageal reflux disease without esophagitis: Secondary | ICD-10-CM | POA: Diagnosis not present

## 2017-02-08 DIAGNOSIS — Z888 Allergy status to other drugs, medicaments and biological substances status: Secondary | ICD-10-CM | POA: Diagnosis not present

## 2017-02-08 DIAGNOSIS — Z86010 Personal history of colonic polyps: Secondary | ICD-10-CM | POA: Diagnosis not present

## 2017-02-12 ENCOUNTER — Ambulatory Visit
Admission: RE | Admit: 2017-02-12 | Discharge: 2017-02-12 | Disposition: A | Discharge status: Home / Self Care | Payer: BLUE CROSS/BLUE SHIELD | Source: Ambulatory Visit | Attending: Radiation Oncology | Admitting: Radiation Oncology

## 2017-02-12 ENCOUNTER — Ambulatory Visit: Payer: BLUE CROSS/BLUE SHIELD

## 2017-02-12 DIAGNOSIS — Z823 Family history of stroke: Secondary | ICD-10-CM | POA: Diagnosis not present

## 2017-02-12 DIAGNOSIS — Z803 Family history of malignant neoplasm of breast: Secondary | ICD-10-CM | POA: Diagnosis not present

## 2017-02-12 DIAGNOSIS — Z85828 Personal history of other malignant neoplasm of skin: Secondary | ICD-10-CM | POA: Diagnosis not present

## 2017-02-12 DIAGNOSIS — Z8042 Family history of malignant neoplasm of prostate: Secondary | ICD-10-CM | POA: Diagnosis not present

## 2017-02-12 DIAGNOSIS — Z800 Family history of malignant neoplasm of digestive organs: Secondary | ICD-10-CM | POA: Diagnosis not present

## 2017-02-12 DIAGNOSIS — K219 Gastro-esophageal reflux disease without esophagitis: Secondary | ICD-10-CM | POA: Diagnosis not present

## 2017-02-12 DIAGNOSIS — Z9079 Acquired absence of other genital organ(s): Secondary | ICD-10-CM | POA: Diagnosis not present

## 2017-02-12 DIAGNOSIS — Z801 Family history of malignant neoplasm of trachea, bronchus and lung: Secondary | ICD-10-CM | POA: Diagnosis not present

## 2017-02-12 DIAGNOSIS — Z510 Encounter for antineoplastic radiation therapy: Secondary | ICD-10-CM | POA: Diagnosis not present

## 2017-02-12 DIAGNOSIS — C61 Malignant neoplasm of prostate: Secondary | ICD-10-CM | POA: Diagnosis not present

## 2017-02-12 DIAGNOSIS — Z9889 Other specified postprocedural states: Secondary | ICD-10-CM | POA: Diagnosis not present

## 2017-02-12 DIAGNOSIS — G629 Polyneuropathy, unspecified: Secondary | ICD-10-CM | POA: Diagnosis not present

## 2017-02-12 DIAGNOSIS — Z86010 Personal history of colonic polyps: Secondary | ICD-10-CM | POA: Diagnosis not present

## 2017-02-12 DIAGNOSIS — D869 Sarcoidosis, unspecified: Secondary | ICD-10-CM | POA: Diagnosis not present

## 2017-02-12 DIAGNOSIS — Z888 Allergy status to other drugs, medicaments and biological substances status: Secondary | ICD-10-CM | POA: Diagnosis not present

## 2017-02-12 DIAGNOSIS — Z87442 Personal history of urinary calculi: Secondary | ICD-10-CM | POA: Diagnosis not present

## 2017-02-13 ENCOUNTER — Ambulatory Visit: Payer: BLUE CROSS/BLUE SHIELD

## 2017-02-13 ENCOUNTER — Ambulatory Visit
Admission: RE | Admit: 2017-02-13 | Discharge: 2017-02-13 | Disposition: A | Discharge status: Home / Self Care | Payer: BLUE CROSS/BLUE SHIELD | Source: Ambulatory Visit | Attending: Radiation Oncology | Admitting: Radiation Oncology

## 2017-02-13 DIAGNOSIS — Z9889 Other specified postprocedural states: Secondary | ICD-10-CM | POA: Diagnosis not present

## 2017-02-13 DIAGNOSIS — Z823 Family history of stroke: Secondary | ICD-10-CM | POA: Diagnosis not present

## 2017-02-13 DIAGNOSIS — Z888 Allergy status to other drugs, medicaments and biological substances status: Secondary | ICD-10-CM | POA: Diagnosis not present

## 2017-02-13 DIAGNOSIS — G629 Polyneuropathy, unspecified: Secondary | ICD-10-CM | POA: Diagnosis not present

## 2017-02-13 DIAGNOSIS — Z800 Family history of malignant neoplasm of digestive organs: Secondary | ICD-10-CM | POA: Diagnosis not present

## 2017-02-13 DIAGNOSIS — Z8042 Family history of malignant neoplasm of prostate: Secondary | ICD-10-CM | POA: Diagnosis not present

## 2017-02-13 DIAGNOSIS — Z87442 Personal history of urinary calculi: Secondary | ICD-10-CM | POA: Diagnosis not present

## 2017-02-13 DIAGNOSIS — Z85828 Personal history of other malignant neoplasm of skin: Secondary | ICD-10-CM | POA: Diagnosis not present

## 2017-02-13 DIAGNOSIS — Z803 Family history of malignant neoplasm of breast: Secondary | ICD-10-CM | POA: Diagnosis not present

## 2017-02-13 DIAGNOSIS — K219 Gastro-esophageal reflux disease without esophagitis: Secondary | ICD-10-CM | POA: Diagnosis not present

## 2017-02-13 DIAGNOSIS — Z86010 Personal history of colonic polyps: Secondary | ICD-10-CM | POA: Diagnosis not present

## 2017-02-13 DIAGNOSIS — Z9079 Acquired absence of other genital organ(s): Secondary | ICD-10-CM | POA: Diagnosis not present

## 2017-02-13 DIAGNOSIS — D869 Sarcoidosis, unspecified: Secondary | ICD-10-CM | POA: Diagnosis not present

## 2017-02-13 DIAGNOSIS — C61 Malignant neoplasm of prostate: Secondary | ICD-10-CM | POA: Diagnosis not present

## 2017-02-13 DIAGNOSIS — Z510 Encounter for antineoplastic radiation therapy: Secondary | ICD-10-CM | POA: Diagnosis not present

## 2017-02-13 DIAGNOSIS — Z801 Family history of malignant neoplasm of trachea, bronchus and lung: Secondary | ICD-10-CM | POA: Diagnosis not present

## 2017-02-14 ENCOUNTER — Ambulatory Visit: Payer: BLUE CROSS/BLUE SHIELD

## 2017-02-14 ENCOUNTER — Ambulatory Visit
Admission: RE | Admit: 2017-02-14 | Discharge: 2017-02-14 | Disposition: A | Discharge status: Home / Self Care | Payer: BLUE CROSS/BLUE SHIELD | Source: Ambulatory Visit | Attending: Radiation Oncology | Admitting: Radiation Oncology

## 2017-02-14 DIAGNOSIS — Z86010 Personal history of colonic polyps: Secondary | ICD-10-CM | POA: Diagnosis not present

## 2017-02-14 DIAGNOSIS — D869 Sarcoidosis, unspecified: Secondary | ICD-10-CM | POA: Diagnosis not present

## 2017-02-14 DIAGNOSIS — Z510 Encounter for antineoplastic radiation therapy: Secondary | ICD-10-CM | POA: Diagnosis not present

## 2017-02-14 DIAGNOSIS — Z9079 Acquired absence of other genital organ(s): Secondary | ICD-10-CM | POA: Diagnosis not present

## 2017-02-14 DIAGNOSIS — Z8042 Family history of malignant neoplasm of prostate: Secondary | ICD-10-CM | POA: Diagnosis not present

## 2017-02-14 DIAGNOSIS — Z888 Allergy status to other drugs, medicaments and biological substances status: Secondary | ICD-10-CM | POA: Diagnosis not present

## 2017-02-14 DIAGNOSIS — Z801 Family history of malignant neoplasm of trachea, bronchus and lung: Secondary | ICD-10-CM | POA: Diagnosis not present

## 2017-02-14 DIAGNOSIS — G629 Polyneuropathy, unspecified: Secondary | ICD-10-CM | POA: Diagnosis not present

## 2017-02-14 DIAGNOSIS — Z823 Family history of stroke: Secondary | ICD-10-CM | POA: Diagnosis not present

## 2017-02-14 DIAGNOSIS — C61 Malignant neoplasm of prostate: Secondary | ICD-10-CM | POA: Diagnosis not present

## 2017-02-14 DIAGNOSIS — Z85828 Personal history of other malignant neoplasm of skin: Secondary | ICD-10-CM | POA: Diagnosis not present

## 2017-02-14 DIAGNOSIS — Z9889 Other specified postprocedural states: Secondary | ICD-10-CM | POA: Diagnosis not present

## 2017-02-14 DIAGNOSIS — K219 Gastro-esophageal reflux disease without esophagitis: Secondary | ICD-10-CM | POA: Diagnosis not present

## 2017-02-14 DIAGNOSIS — Z803 Family history of malignant neoplasm of breast: Secondary | ICD-10-CM | POA: Diagnosis not present

## 2017-02-14 DIAGNOSIS — Z87442 Personal history of urinary calculi: Secondary | ICD-10-CM | POA: Diagnosis not present

## 2017-02-14 DIAGNOSIS — Z800 Family history of malignant neoplasm of digestive organs: Secondary | ICD-10-CM | POA: Diagnosis not present

## 2017-02-15 ENCOUNTER — Encounter: Payer: Self-pay | Admitting: Medical Oncology

## 2017-02-15 ENCOUNTER — Ambulatory Visit
Admission: RE | Admit: 2017-02-15 | Discharge: 2017-02-15 | Disposition: A | Discharge status: Home / Self Care | Payer: BLUE CROSS/BLUE SHIELD | Source: Ambulatory Visit | Attending: Radiation Oncology | Admitting: Radiation Oncology

## 2017-02-15 ENCOUNTER — Ambulatory Visit: Payer: BLUE CROSS/BLUE SHIELD

## 2017-02-15 DIAGNOSIS — Z87442 Personal history of urinary calculi: Secondary | ICD-10-CM | POA: Diagnosis not present

## 2017-02-15 DIAGNOSIS — Z510 Encounter for antineoplastic radiation therapy: Secondary | ICD-10-CM | POA: Diagnosis not present

## 2017-02-15 DIAGNOSIS — Z888 Allergy status to other drugs, medicaments and biological substances status: Secondary | ICD-10-CM | POA: Diagnosis not present

## 2017-02-15 DIAGNOSIS — Z9889 Other specified postprocedural states: Secondary | ICD-10-CM | POA: Diagnosis not present

## 2017-02-15 DIAGNOSIS — Z85828 Personal history of other malignant neoplasm of skin: Secondary | ICD-10-CM | POA: Diagnosis not present

## 2017-02-15 DIAGNOSIS — Z803 Family history of malignant neoplasm of breast: Secondary | ICD-10-CM | POA: Diagnosis not present

## 2017-02-15 DIAGNOSIS — C61 Malignant neoplasm of prostate: Secondary | ICD-10-CM | POA: Diagnosis not present

## 2017-02-15 DIAGNOSIS — G629 Polyneuropathy, unspecified: Secondary | ICD-10-CM | POA: Diagnosis not present

## 2017-02-15 DIAGNOSIS — Z8042 Family history of malignant neoplasm of prostate: Secondary | ICD-10-CM | POA: Diagnosis not present

## 2017-02-15 DIAGNOSIS — Z801 Family history of malignant neoplasm of trachea, bronchus and lung: Secondary | ICD-10-CM | POA: Diagnosis not present

## 2017-02-15 DIAGNOSIS — Z823 Family history of stroke: Secondary | ICD-10-CM | POA: Diagnosis not present

## 2017-02-15 DIAGNOSIS — Z9079 Acquired absence of other genital organ(s): Secondary | ICD-10-CM | POA: Diagnosis not present

## 2017-02-15 DIAGNOSIS — K219 Gastro-esophageal reflux disease without esophagitis: Secondary | ICD-10-CM | POA: Diagnosis not present

## 2017-02-15 DIAGNOSIS — Z86010 Personal history of colonic polyps: Secondary | ICD-10-CM | POA: Diagnosis not present

## 2017-02-15 DIAGNOSIS — D869 Sarcoidosis, unspecified: Secondary | ICD-10-CM | POA: Diagnosis not present

## 2017-02-15 DIAGNOSIS — Z800 Family history of malignant neoplasm of digestive organs: Secondary | ICD-10-CM | POA: Diagnosis not present

## 2017-02-15 NOTE — Progress Notes (Signed)
[name] states radiation is going well with no major side effects. I will continue to follow and asked him to call with questions or concerns.

## 2017-02-18 ENCOUNTER — Ambulatory Visit: Payer: BLUE CROSS/BLUE SHIELD

## 2017-02-18 ENCOUNTER — Ambulatory Visit
Admission: RE | Admit: 2017-02-18 | Discharge: 2017-02-18 | Disposition: A | Discharge status: Home / Self Care | Payer: BLUE CROSS/BLUE SHIELD | Source: Ambulatory Visit | Attending: Radiation Oncology | Admitting: Radiation Oncology

## 2017-02-18 DIAGNOSIS — Z85828 Personal history of other malignant neoplasm of skin: Secondary | ICD-10-CM | POA: Diagnosis not present

## 2017-02-18 DIAGNOSIS — Z510 Encounter for antineoplastic radiation therapy: Secondary | ICD-10-CM | POA: Diagnosis not present

## 2017-02-18 DIAGNOSIS — Z800 Family history of malignant neoplasm of digestive organs: Secondary | ICD-10-CM | POA: Diagnosis not present

## 2017-02-18 DIAGNOSIS — Z87442 Personal history of urinary calculi: Secondary | ICD-10-CM | POA: Diagnosis not present

## 2017-02-18 DIAGNOSIS — Z801 Family history of malignant neoplasm of trachea, bronchus and lung: Secondary | ICD-10-CM | POA: Diagnosis not present

## 2017-02-18 DIAGNOSIS — Z888 Allergy status to other drugs, medicaments and biological substances status: Secondary | ICD-10-CM | POA: Diagnosis not present

## 2017-02-18 DIAGNOSIS — D869 Sarcoidosis, unspecified: Secondary | ICD-10-CM | POA: Diagnosis not present

## 2017-02-18 DIAGNOSIS — Z9889 Other specified postprocedural states: Secondary | ICD-10-CM | POA: Diagnosis not present

## 2017-02-18 DIAGNOSIS — Z9079 Acquired absence of other genital organ(s): Secondary | ICD-10-CM | POA: Diagnosis not present

## 2017-02-18 DIAGNOSIS — G629 Polyneuropathy, unspecified: Secondary | ICD-10-CM | POA: Diagnosis not present

## 2017-02-18 DIAGNOSIS — C61 Malignant neoplasm of prostate: Secondary | ICD-10-CM | POA: Diagnosis not present

## 2017-02-18 DIAGNOSIS — Z823 Family history of stroke: Secondary | ICD-10-CM | POA: Diagnosis not present

## 2017-02-18 DIAGNOSIS — Z8042 Family history of malignant neoplasm of prostate: Secondary | ICD-10-CM | POA: Diagnosis not present

## 2017-02-18 DIAGNOSIS — K219 Gastro-esophageal reflux disease without esophagitis: Secondary | ICD-10-CM | POA: Diagnosis not present

## 2017-02-18 DIAGNOSIS — Z86010 Personal history of colonic polyps: Secondary | ICD-10-CM | POA: Diagnosis not present

## 2017-02-18 DIAGNOSIS — Z803 Family history of malignant neoplasm of breast: Secondary | ICD-10-CM | POA: Diagnosis not present

## 2017-02-19 ENCOUNTER — Ambulatory Visit
Admission: RE | Admit: 2017-02-19 | Discharge: 2017-02-19 | Disposition: A | Discharge status: Home / Self Care | Payer: BLUE CROSS/BLUE SHIELD | Source: Ambulatory Visit | Attending: Radiation Oncology | Admitting: Radiation Oncology

## 2017-02-19 ENCOUNTER — Ambulatory Visit: Payer: BLUE CROSS/BLUE SHIELD

## 2017-02-19 DIAGNOSIS — Z86010 Personal history of colonic polyps: Secondary | ICD-10-CM | POA: Diagnosis not present

## 2017-02-19 DIAGNOSIS — Z510 Encounter for antineoplastic radiation therapy: Secondary | ICD-10-CM | POA: Diagnosis not present

## 2017-02-19 DIAGNOSIS — Z803 Family history of malignant neoplasm of breast: Secondary | ICD-10-CM | POA: Diagnosis not present

## 2017-02-19 DIAGNOSIS — Z800 Family history of malignant neoplasm of digestive organs: Secondary | ICD-10-CM | POA: Diagnosis not present

## 2017-02-19 DIAGNOSIS — Z823 Family history of stroke: Secondary | ICD-10-CM | POA: Diagnosis not present

## 2017-02-19 DIAGNOSIS — Z9889 Other specified postprocedural states: Secondary | ICD-10-CM | POA: Diagnosis not present

## 2017-02-19 DIAGNOSIS — Z85828 Personal history of other malignant neoplasm of skin: Secondary | ICD-10-CM | POA: Diagnosis not present

## 2017-02-19 DIAGNOSIS — D869 Sarcoidosis, unspecified: Secondary | ICD-10-CM | POA: Diagnosis not present

## 2017-02-19 DIAGNOSIS — Z8042 Family history of malignant neoplasm of prostate: Secondary | ICD-10-CM | POA: Diagnosis not present

## 2017-02-19 DIAGNOSIS — Z87442 Personal history of urinary calculi: Secondary | ICD-10-CM | POA: Diagnosis not present

## 2017-02-19 DIAGNOSIS — Z888 Allergy status to other drugs, medicaments and biological substances status: Secondary | ICD-10-CM | POA: Diagnosis not present

## 2017-02-19 DIAGNOSIS — Z801 Family history of malignant neoplasm of trachea, bronchus and lung: Secondary | ICD-10-CM | POA: Diagnosis not present

## 2017-02-19 DIAGNOSIS — Z9079 Acquired absence of other genital organ(s): Secondary | ICD-10-CM | POA: Diagnosis not present

## 2017-02-19 DIAGNOSIS — C61 Malignant neoplasm of prostate: Secondary | ICD-10-CM | POA: Diagnosis not present

## 2017-02-19 DIAGNOSIS — K219 Gastro-esophageal reflux disease without esophagitis: Secondary | ICD-10-CM | POA: Diagnosis not present

## 2017-02-19 DIAGNOSIS — G629 Polyneuropathy, unspecified: Secondary | ICD-10-CM | POA: Diagnosis not present

## 2017-02-20 ENCOUNTER — Ambulatory Visit
Admission: RE | Admit: 2017-02-20 | Discharge: 2017-02-20 | Disposition: A | Discharge status: Home / Self Care | Payer: BLUE CROSS/BLUE SHIELD | Source: Ambulatory Visit | Attending: Radiation Oncology | Admitting: Radiation Oncology

## 2017-02-20 ENCOUNTER — Ambulatory Visit: Payer: BLUE CROSS/BLUE SHIELD

## 2017-02-20 DIAGNOSIS — Z888 Allergy status to other drugs, medicaments and biological substances status: Secondary | ICD-10-CM | POA: Diagnosis not present

## 2017-02-20 DIAGNOSIS — Z823 Family history of stroke: Secondary | ICD-10-CM | POA: Diagnosis not present

## 2017-02-20 DIAGNOSIS — Z803 Family history of malignant neoplasm of breast: Secondary | ICD-10-CM | POA: Diagnosis not present

## 2017-02-20 DIAGNOSIS — Z510 Encounter for antineoplastic radiation therapy: Secondary | ICD-10-CM | POA: Diagnosis not present

## 2017-02-20 DIAGNOSIS — Z9079 Acquired absence of other genital organ(s): Secondary | ICD-10-CM | POA: Diagnosis not present

## 2017-02-20 DIAGNOSIS — Z9889 Other specified postprocedural states: Secondary | ICD-10-CM | POA: Diagnosis not present

## 2017-02-20 DIAGNOSIS — C61 Malignant neoplasm of prostate: Secondary | ICD-10-CM | POA: Diagnosis not present

## 2017-02-20 DIAGNOSIS — Z800 Family history of malignant neoplasm of digestive organs: Secondary | ICD-10-CM | POA: Diagnosis not present

## 2017-02-20 DIAGNOSIS — D869 Sarcoidosis, unspecified: Secondary | ICD-10-CM | POA: Diagnosis not present

## 2017-02-20 DIAGNOSIS — G629 Polyneuropathy, unspecified: Secondary | ICD-10-CM | POA: Diagnosis not present

## 2017-02-20 DIAGNOSIS — K219 Gastro-esophageal reflux disease without esophagitis: Secondary | ICD-10-CM | POA: Diagnosis not present

## 2017-02-20 DIAGNOSIS — Z85828 Personal history of other malignant neoplasm of skin: Secondary | ICD-10-CM | POA: Diagnosis not present

## 2017-02-20 DIAGNOSIS — Z86010 Personal history of colonic polyps: Secondary | ICD-10-CM | POA: Diagnosis not present

## 2017-02-20 DIAGNOSIS — Z87442 Personal history of urinary calculi: Secondary | ICD-10-CM | POA: Diagnosis not present

## 2017-02-20 DIAGNOSIS — Z801 Family history of malignant neoplasm of trachea, bronchus and lung: Secondary | ICD-10-CM | POA: Diagnosis not present

## 2017-02-20 DIAGNOSIS — Z8042 Family history of malignant neoplasm of prostate: Secondary | ICD-10-CM | POA: Diagnosis not present

## 2017-02-21 ENCOUNTER — Ambulatory Visit: Payer: BLUE CROSS/BLUE SHIELD

## 2017-02-22 ENCOUNTER — Ambulatory Visit
Admission: RE | Admit: 2017-02-22 | Discharge: 2017-02-22 | Disposition: A | Discharge status: Home / Self Care | Payer: BLUE CROSS/BLUE SHIELD | Source: Ambulatory Visit | Attending: Radiation Oncology | Admitting: Radiation Oncology

## 2017-02-22 ENCOUNTER — Ambulatory Visit: Payer: BLUE CROSS/BLUE SHIELD

## 2017-02-22 DIAGNOSIS — Z85828 Personal history of other malignant neoplasm of skin: Secondary | ICD-10-CM | POA: Diagnosis not present

## 2017-02-22 DIAGNOSIS — Z510 Encounter for antineoplastic radiation therapy: Secondary | ICD-10-CM | POA: Diagnosis not present

## 2017-02-22 DIAGNOSIS — G629 Polyneuropathy, unspecified: Secondary | ICD-10-CM | POA: Diagnosis not present

## 2017-02-22 DIAGNOSIS — Z86010 Personal history of colonic polyps: Secondary | ICD-10-CM | POA: Diagnosis not present

## 2017-02-22 DIAGNOSIS — Z888 Allergy status to other drugs, medicaments and biological substances status: Secondary | ICD-10-CM | POA: Diagnosis not present

## 2017-02-22 DIAGNOSIS — Z9889 Other specified postprocedural states: Secondary | ICD-10-CM | POA: Diagnosis not present

## 2017-02-22 DIAGNOSIS — Z800 Family history of malignant neoplasm of digestive organs: Secondary | ICD-10-CM | POA: Diagnosis not present

## 2017-02-22 DIAGNOSIS — Z823 Family history of stroke: Secondary | ICD-10-CM | POA: Diagnosis not present

## 2017-02-22 DIAGNOSIS — Z87442 Personal history of urinary calculi: Secondary | ICD-10-CM | POA: Diagnosis not present

## 2017-02-22 DIAGNOSIS — C61 Malignant neoplasm of prostate: Secondary | ICD-10-CM | POA: Diagnosis not present

## 2017-02-22 DIAGNOSIS — Z803 Family history of malignant neoplasm of breast: Secondary | ICD-10-CM | POA: Diagnosis not present

## 2017-02-22 DIAGNOSIS — Z9079 Acquired absence of other genital organ(s): Secondary | ICD-10-CM | POA: Diagnosis not present

## 2017-02-22 DIAGNOSIS — Z8042 Family history of malignant neoplasm of prostate: Secondary | ICD-10-CM | POA: Diagnosis not present

## 2017-02-22 DIAGNOSIS — D869 Sarcoidosis, unspecified: Secondary | ICD-10-CM | POA: Diagnosis not present

## 2017-02-22 DIAGNOSIS — K219 Gastro-esophageal reflux disease without esophagitis: Secondary | ICD-10-CM | POA: Diagnosis not present

## 2017-02-22 DIAGNOSIS — Z801 Family history of malignant neoplasm of trachea, bronchus and lung: Secondary | ICD-10-CM | POA: Diagnosis not present

## 2017-02-22 NOTE — Progress Notes (Signed)
Patient presented to the clinic today accompanied by his wife. Patient reports that on Wednesday he had two bowel movements. Reports he noted bright red blood in the toilet water following each one. Explained this hasn't happened since but, he continues to see scant blood when he wipes. Reassured patient that rectal irritation is not uncommon with prostate radiation. Patient confirms bowel movements are soft. Encouraged patient use baby wipes to cleanse himself following a bowel movement. Explained if there is more blood seen in the toilet water we can consider a suppository with steroid. Patient verbalized understanding.

## 2017-02-25 ENCOUNTER — Ambulatory Visit
Admission: RE | Admit: 2017-02-25 | Discharge: 2017-02-25 | Disposition: A | Discharge status: Home / Self Care | Payer: BLUE CROSS/BLUE SHIELD | Source: Ambulatory Visit | Attending: Radiation Oncology | Admitting: Radiation Oncology

## 2017-02-25 ENCOUNTER — Ambulatory Visit: Payer: BLUE CROSS/BLUE SHIELD

## 2017-02-25 DIAGNOSIS — Z888 Allergy status to other drugs, medicaments and biological substances status: Secondary | ICD-10-CM | POA: Diagnosis not present

## 2017-02-25 DIAGNOSIS — Z823 Family history of stroke: Secondary | ICD-10-CM | POA: Diagnosis not present

## 2017-02-25 DIAGNOSIS — Z87442 Personal history of urinary calculi: Secondary | ICD-10-CM | POA: Diagnosis not present

## 2017-02-25 DIAGNOSIS — Z803 Family history of malignant neoplasm of breast: Secondary | ICD-10-CM | POA: Diagnosis not present

## 2017-02-25 DIAGNOSIS — C61 Malignant neoplasm of prostate: Secondary | ICD-10-CM | POA: Diagnosis not present

## 2017-02-25 DIAGNOSIS — Z8042 Family history of malignant neoplasm of prostate: Secondary | ICD-10-CM | POA: Diagnosis not present

## 2017-02-25 DIAGNOSIS — D869 Sarcoidosis, unspecified: Secondary | ICD-10-CM | POA: Diagnosis not present

## 2017-02-25 DIAGNOSIS — Z86010 Personal history of colonic polyps: Secondary | ICD-10-CM | POA: Diagnosis not present

## 2017-02-25 DIAGNOSIS — Z85828 Personal history of other malignant neoplasm of skin: Secondary | ICD-10-CM | POA: Diagnosis not present

## 2017-02-25 DIAGNOSIS — Z9079 Acquired absence of other genital organ(s): Secondary | ICD-10-CM | POA: Diagnosis not present

## 2017-02-25 DIAGNOSIS — Z800 Family history of malignant neoplasm of digestive organs: Secondary | ICD-10-CM | POA: Diagnosis not present

## 2017-02-25 DIAGNOSIS — Z510 Encounter for antineoplastic radiation therapy: Secondary | ICD-10-CM | POA: Diagnosis not present

## 2017-02-25 DIAGNOSIS — K219 Gastro-esophageal reflux disease without esophagitis: Secondary | ICD-10-CM | POA: Diagnosis not present

## 2017-02-25 DIAGNOSIS — Z9889 Other specified postprocedural states: Secondary | ICD-10-CM | POA: Diagnosis not present

## 2017-02-25 DIAGNOSIS — G629 Polyneuropathy, unspecified: Secondary | ICD-10-CM | POA: Diagnosis not present

## 2017-02-25 DIAGNOSIS — Z801 Family history of malignant neoplasm of trachea, bronchus and lung: Secondary | ICD-10-CM | POA: Diagnosis not present

## 2017-02-26 ENCOUNTER — Ambulatory Visit: Payer: BLUE CROSS/BLUE SHIELD

## 2017-02-26 ENCOUNTER — Ambulatory Visit
Admission: RE | Admit: 2017-02-26 | Discharge: 2017-02-26 | Disposition: A | Discharge status: Home / Self Care | Payer: BLUE CROSS/BLUE SHIELD | Source: Ambulatory Visit | Attending: Radiation Oncology | Admitting: Radiation Oncology

## 2017-02-26 DIAGNOSIS — Z800 Family history of malignant neoplasm of digestive organs: Secondary | ICD-10-CM | POA: Diagnosis not present

## 2017-02-26 DIAGNOSIS — Z888 Allergy status to other drugs, medicaments and biological substances status: Secondary | ICD-10-CM | POA: Diagnosis not present

## 2017-02-26 DIAGNOSIS — Z85828 Personal history of other malignant neoplasm of skin: Secondary | ICD-10-CM | POA: Diagnosis not present

## 2017-02-26 DIAGNOSIS — Z510 Encounter for antineoplastic radiation therapy: Secondary | ICD-10-CM | POA: Diagnosis not present

## 2017-02-26 DIAGNOSIS — Z9889 Other specified postprocedural states: Secondary | ICD-10-CM | POA: Diagnosis not present

## 2017-02-26 DIAGNOSIS — Z86010 Personal history of colonic polyps: Secondary | ICD-10-CM | POA: Diagnosis not present

## 2017-02-26 DIAGNOSIS — D869 Sarcoidosis, unspecified: Secondary | ICD-10-CM | POA: Diagnosis not present

## 2017-02-26 DIAGNOSIS — K219 Gastro-esophageal reflux disease without esophagitis: Secondary | ICD-10-CM | POA: Diagnosis not present

## 2017-02-26 DIAGNOSIS — Z823 Family history of stroke: Secondary | ICD-10-CM | POA: Diagnosis not present

## 2017-02-26 DIAGNOSIS — Z87442 Personal history of urinary calculi: Secondary | ICD-10-CM | POA: Diagnosis not present

## 2017-02-26 DIAGNOSIS — Z803 Family history of malignant neoplasm of breast: Secondary | ICD-10-CM | POA: Diagnosis not present

## 2017-02-26 DIAGNOSIS — Z8042 Family history of malignant neoplasm of prostate: Secondary | ICD-10-CM | POA: Diagnosis not present

## 2017-02-26 DIAGNOSIS — G629 Polyneuropathy, unspecified: Secondary | ICD-10-CM | POA: Diagnosis not present

## 2017-02-26 DIAGNOSIS — Z801 Family history of malignant neoplasm of trachea, bronchus and lung: Secondary | ICD-10-CM | POA: Diagnosis not present

## 2017-02-26 DIAGNOSIS — Z9079 Acquired absence of other genital organ(s): Secondary | ICD-10-CM | POA: Diagnosis not present

## 2017-02-26 DIAGNOSIS — C61 Malignant neoplasm of prostate: Secondary | ICD-10-CM | POA: Diagnosis not present

## 2017-02-27 ENCOUNTER — Ambulatory Visit: Payer: BLUE CROSS/BLUE SHIELD

## 2017-02-27 ENCOUNTER — Ambulatory Visit
Admission: RE | Admit: 2017-02-27 | Discharge: 2017-02-27 | Disposition: A | Discharge status: Home / Self Care | Payer: BLUE CROSS/BLUE SHIELD | Source: Ambulatory Visit | Attending: Radiation Oncology | Admitting: Radiation Oncology

## 2017-02-27 DIAGNOSIS — Z87442 Personal history of urinary calculi: Secondary | ICD-10-CM | POA: Diagnosis not present

## 2017-02-27 DIAGNOSIS — Z800 Family history of malignant neoplasm of digestive organs: Secondary | ICD-10-CM | POA: Diagnosis not present

## 2017-02-27 DIAGNOSIS — Z888 Allergy status to other drugs, medicaments and biological substances status: Secondary | ICD-10-CM | POA: Diagnosis not present

## 2017-02-27 DIAGNOSIS — C61 Malignant neoplasm of prostate: Secondary | ICD-10-CM | POA: Diagnosis not present

## 2017-02-27 DIAGNOSIS — Z803 Family history of malignant neoplasm of breast: Secondary | ICD-10-CM | POA: Diagnosis not present

## 2017-02-27 DIAGNOSIS — K219 Gastro-esophageal reflux disease without esophagitis: Secondary | ICD-10-CM | POA: Diagnosis not present

## 2017-02-27 DIAGNOSIS — Z510 Encounter for antineoplastic radiation therapy: Secondary | ICD-10-CM | POA: Diagnosis not present

## 2017-02-27 DIAGNOSIS — Z8042 Family history of malignant neoplasm of prostate: Secondary | ICD-10-CM | POA: Diagnosis not present

## 2017-02-27 DIAGNOSIS — D869 Sarcoidosis, unspecified: Secondary | ICD-10-CM | POA: Diagnosis not present

## 2017-02-27 DIAGNOSIS — G629 Polyneuropathy, unspecified: Secondary | ICD-10-CM | POA: Diagnosis not present

## 2017-02-27 DIAGNOSIS — Z9889 Other specified postprocedural states: Secondary | ICD-10-CM | POA: Diagnosis not present

## 2017-02-27 DIAGNOSIS — Z85828 Personal history of other malignant neoplasm of skin: Secondary | ICD-10-CM | POA: Diagnosis not present

## 2017-02-27 DIAGNOSIS — Z823 Family history of stroke: Secondary | ICD-10-CM | POA: Diagnosis not present

## 2017-02-27 DIAGNOSIS — Z86010 Personal history of colonic polyps: Secondary | ICD-10-CM | POA: Diagnosis not present

## 2017-02-27 DIAGNOSIS — Z801 Family history of malignant neoplasm of trachea, bronchus and lung: Secondary | ICD-10-CM | POA: Diagnosis not present

## 2017-02-27 DIAGNOSIS — Z9079 Acquired absence of other genital organ(s): Secondary | ICD-10-CM | POA: Diagnosis not present

## 2017-02-28 ENCOUNTER — Ambulatory Visit
Admission: RE | Admit: 2017-02-28 | Discharge: 2017-02-28 | Disposition: A | Discharge status: Home / Self Care | Payer: BLUE CROSS/BLUE SHIELD | Source: Ambulatory Visit | Attending: Radiation Oncology | Admitting: Radiation Oncology

## 2017-02-28 ENCOUNTER — Ambulatory Visit: Payer: BLUE CROSS/BLUE SHIELD

## 2017-02-28 DIAGNOSIS — K219 Gastro-esophageal reflux disease without esophagitis: Secondary | ICD-10-CM | POA: Diagnosis not present

## 2017-02-28 DIAGNOSIS — Z801 Family history of malignant neoplasm of trachea, bronchus and lung: Secondary | ICD-10-CM | POA: Diagnosis not present

## 2017-02-28 DIAGNOSIS — Z9889 Other specified postprocedural states: Secondary | ICD-10-CM | POA: Diagnosis not present

## 2017-02-28 DIAGNOSIS — Z888 Allergy status to other drugs, medicaments and biological substances status: Secondary | ICD-10-CM | POA: Diagnosis not present

## 2017-02-28 DIAGNOSIS — Z87442 Personal history of urinary calculi: Secondary | ICD-10-CM | POA: Diagnosis not present

## 2017-02-28 DIAGNOSIS — Z800 Family history of malignant neoplasm of digestive organs: Secondary | ICD-10-CM | POA: Diagnosis not present

## 2017-02-28 DIAGNOSIS — Z8042 Family history of malignant neoplasm of prostate: Secondary | ICD-10-CM | POA: Diagnosis not present

## 2017-02-28 DIAGNOSIS — Z803 Family history of malignant neoplasm of breast: Secondary | ICD-10-CM | POA: Diagnosis not present

## 2017-02-28 DIAGNOSIS — C61 Malignant neoplasm of prostate: Secondary | ICD-10-CM | POA: Diagnosis not present

## 2017-02-28 DIAGNOSIS — Z510 Encounter for antineoplastic radiation therapy: Secondary | ICD-10-CM | POA: Diagnosis not present

## 2017-02-28 DIAGNOSIS — Z9079 Acquired absence of other genital organ(s): Secondary | ICD-10-CM | POA: Diagnosis not present

## 2017-02-28 DIAGNOSIS — Z85828 Personal history of other malignant neoplasm of skin: Secondary | ICD-10-CM | POA: Diagnosis not present

## 2017-02-28 DIAGNOSIS — Z86010 Personal history of colonic polyps: Secondary | ICD-10-CM | POA: Diagnosis not present

## 2017-02-28 DIAGNOSIS — G629 Polyneuropathy, unspecified: Secondary | ICD-10-CM | POA: Diagnosis not present

## 2017-02-28 DIAGNOSIS — D869 Sarcoidosis, unspecified: Secondary | ICD-10-CM | POA: Diagnosis not present

## 2017-02-28 DIAGNOSIS — Z823 Family history of stroke: Secondary | ICD-10-CM | POA: Diagnosis not present

## 2017-03-01 ENCOUNTER — Encounter: Payer: Self-pay | Admitting: Medical Oncology

## 2017-03-01 ENCOUNTER — Ambulatory Visit: Payer: BLUE CROSS/BLUE SHIELD

## 2017-03-01 ENCOUNTER — Ambulatory Visit
Admission: RE | Admit: 2017-03-01 | Discharge: 2017-03-01 | Disposition: A | Discharge status: Home / Self Care | Payer: BLUE CROSS/BLUE SHIELD | Source: Ambulatory Visit | Attending: Radiation Oncology | Admitting: Radiation Oncology

## 2017-03-01 DIAGNOSIS — Z85828 Personal history of other malignant neoplasm of skin: Secondary | ICD-10-CM | POA: Diagnosis not present

## 2017-03-01 DIAGNOSIS — Z87442 Personal history of urinary calculi: Secondary | ICD-10-CM | POA: Diagnosis not present

## 2017-03-01 DIAGNOSIS — G629 Polyneuropathy, unspecified: Secondary | ICD-10-CM | POA: Diagnosis not present

## 2017-03-01 DIAGNOSIS — Z9079 Acquired absence of other genital organ(s): Secondary | ICD-10-CM | POA: Diagnosis not present

## 2017-03-01 DIAGNOSIS — Z510 Encounter for antineoplastic radiation therapy: Secondary | ICD-10-CM | POA: Diagnosis not present

## 2017-03-01 DIAGNOSIS — Z8042 Family history of malignant neoplasm of prostate: Secondary | ICD-10-CM | POA: Diagnosis not present

## 2017-03-01 DIAGNOSIS — Z803 Family history of malignant neoplasm of breast: Secondary | ICD-10-CM | POA: Diagnosis not present

## 2017-03-01 DIAGNOSIS — D869 Sarcoidosis, unspecified: Secondary | ICD-10-CM | POA: Diagnosis not present

## 2017-03-01 DIAGNOSIS — Z9889 Other specified postprocedural states: Secondary | ICD-10-CM | POA: Diagnosis not present

## 2017-03-01 DIAGNOSIS — Z800 Family history of malignant neoplasm of digestive organs: Secondary | ICD-10-CM | POA: Diagnosis not present

## 2017-03-01 DIAGNOSIS — C61 Malignant neoplasm of prostate: Secondary | ICD-10-CM | POA: Diagnosis not present

## 2017-03-01 DIAGNOSIS — Z823 Family history of stroke: Secondary | ICD-10-CM | POA: Diagnosis not present

## 2017-03-01 DIAGNOSIS — K219 Gastro-esophageal reflux disease without esophagitis: Secondary | ICD-10-CM | POA: Diagnosis not present

## 2017-03-01 DIAGNOSIS — Z801 Family history of malignant neoplasm of trachea, bronchus and lung: Secondary | ICD-10-CM | POA: Diagnosis not present

## 2017-03-01 DIAGNOSIS — Z86010 Personal history of colonic polyps: Secondary | ICD-10-CM | POA: Diagnosis not present

## 2017-03-01 DIAGNOSIS — Z888 Allergy status to other drugs, medicaments and biological substances status: Secondary | ICD-10-CM | POA: Diagnosis not present

## 2017-03-01 NOTE — Progress Notes (Signed)
Philip Hernandez states he is doing well with radiation. He states that his dog of 13 years could not stand this morning. He is really concerned about her and is going to take her to the vet after treatment. He also shared that his dad broke his ankle in a mowing accident about 2 weeks ago and now his mother fell and broke her hip. She is in a rehab center currently. He and his brother are taking turns trying to care for his parents. I will continue to follow and refer him to support services he needed.

## 2017-03-04 ENCOUNTER — Ambulatory Visit
Admission: RE | Admit: 2017-03-04 | Discharge: 2017-03-04 | Disposition: A | Discharge status: Home / Self Care | Payer: BLUE CROSS/BLUE SHIELD | Source: Ambulatory Visit | Attending: Radiation Oncology | Admitting: Radiation Oncology

## 2017-03-04 ENCOUNTER — Ambulatory Visit: Payer: BLUE CROSS/BLUE SHIELD

## 2017-03-04 DIAGNOSIS — Z9079 Acquired absence of other genital organ(s): Secondary | ICD-10-CM | POA: Diagnosis not present

## 2017-03-04 DIAGNOSIS — K219 Gastro-esophageal reflux disease without esophagitis: Secondary | ICD-10-CM | POA: Diagnosis not present

## 2017-03-04 DIAGNOSIS — Z510 Encounter for antineoplastic radiation therapy: Secondary | ICD-10-CM | POA: Diagnosis not present

## 2017-03-04 DIAGNOSIS — Z9889 Other specified postprocedural states: Secondary | ICD-10-CM | POA: Diagnosis not present

## 2017-03-04 DIAGNOSIS — Z800 Family history of malignant neoplasm of digestive organs: Secondary | ICD-10-CM | POA: Diagnosis not present

## 2017-03-04 DIAGNOSIS — G629 Polyneuropathy, unspecified: Secondary | ICD-10-CM | POA: Diagnosis not present

## 2017-03-04 DIAGNOSIS — Z888 Allergy status to other drugs, medicaments and biological substances status: Secondary | ICD-10-CM | POA: Diagnosis not present

## 2017-03-04 DIAGNOSIS — Z803 Family history of malignant neoplasm of breast: Secondary | ICD-10-CM | POA: Diagnosis not present

## 2017-03-04 DIAGNOSIS — Z801 Family history of malignant neoplasm of trachea, bronchus and lung: Secondary | ICD-10-CM | POA: Diagnosis not present

## 2017-03-04 DIAGNOSIS — Z86010 Personal history of colonic polyps: Secondary | ICD-10-CM | POA: Diagnosis not present

## 2017-03-04 DIAGNOSIS — Z87442 Personal history of urinary calculi: Secondary | ICD-10-CM | POA: Diagnosis not present

## 2017-03-04 DIAGNOSIS — Z8042 Family history of malignant neoplasm of prostate: Secondary | ICD-10-CM | POA: Diagnosis not present

## 2017-03-04 DIAGNOSIS — C61 Malignant neoplasm of prostate: Secondary | ICD-10-CM | POA: Diagnosis not present

## 2017-03-04 DIAGNOSIS — Z85828 Personal history of other malignant neoplasm of skin: Secondary | ICD-10-CM | POA: Diagnosis not present

## 2017-03-04 DIAGNOSIS — Z823 Family history of stroke: Secondary | ICD-10-CM | POA: Diagnosis not present

## 2017-03-04 DIAGNOSIS — D869 Sarcoidosis, unspecified: Secondary | ICD-10-CM | POA: Diagnosis not present

## 2017-03-05 ENCOUNTER — Ambulatory Visit
Admission: RE | Admit: 2017-03-05 | Discharge: 2017-03-05 | Disposition: A | Discharge status: Home / Self Care | Payer: BLUE CROSS/BLUE SHIELD | Source: Ambulatory Visit | Attending: Radiation Oncology | Admitting: Radiation Oncology

## 2017-03-05 ENCOUNTER — Ambulatory Visit: Payer: BLUE CROSS/BLUE SHIELD

## 2017-03-05 DIAGNOSIS — Z823 Family history of stroke: Secondary | ICD-10-CM | POA: Diagnosis not present

## 2017-03-05 DIAGNOSIS — G629 Polyneuropathy, unspecified: Secondary | ICD-10-CM | POA: Diagnosis not present

## 2017-03-05 DIAGNOSIS — Z800 Family history of malignant neoplasm of digestive organs: Secondary | ICD-10-CM | POA: Diagnosis not present

## 2017-03-05 DIAGNOSIS — C61 Malignant neoplasm of prostate: Secondary | ICD-10-CM | POA: Diagnosis not present

## 2017-03-05 DIAGNOSIS — Z510 Encounter for antineoplastic radiation therapy: Secondary | ICD-10-CM | POA: Diagnosis not present

## 2017-03-05 DIAGNOSIS — Z803 Family history of malignant neoplasm of breast: Secondary | ICD-10-CM | POA: Diagnosis not present

## 2017-03-05 DIAGNOSIS — Z801 Family history of malignant neoplasm of trachea, bronchus and lung: Secondary | ICD-10-CM | POA: Diagnosis not present

## 2017-03-05 DIAGNOSIS — Z9079 Acquired absence of other genital organ(s): Secondary | ICD-10-CM | POA: Diagnosis not present

## 2017-03-05 DIAGNOSIS — D869 Sarcoidosis, unspecified: Secondary | ICD-10-CM | POA: Diagnosis not present

## 2017-03-05 DIAGNOSIS — Z888 Allergy status to other drugs, medicaments and biological substances status: Secondary | ICD-10-CM | POA: Diagnosis not present

## 2017-03-05 DIAGNOSIS — Z9889 Other specified postprocedural states: Secondary | ICD-10-CM | POA: Diagnosis not present

## 2017-03-05 DIAGNOSIS — Z87442 Personal history of urinary calculi: Secondary | ICD-10-CM | POA: Diagnosis not present

## 2017-03-05 DIAGNOSIS — Z86010 Personal history of colonic polyps: Secondary | ICD-10-CM | POA: Diagnosis not present

## 2017-03-05 DIAGNOSIS — Z85828 Personal history of other malignant neoplasm of skin: Secondary | ICD-10-CM | POA: Diagnosis not present

## 2017-03-05 DIAGNOSIS — Z8042 Family history of malignant neoplasm of prostate: Secondary | ICD-10-CM | POA: Diagnosis not present

## 2017-03-05 DIAGNOSIS — K219 Gastro-esophageal reflux disease without esophagitis: Secondary | ICD-10-CM | POA: Diagnosis not present

## 2017-03-06 ENCOUNTER — Ambulatory Visit
Admission: RE | Admit: 2017-03-06 | Discharge: 2017-03-06 | Disposition: A | Discharge status: Home / Self Care | Payer: BLUE CROSS/BLUE SHIELD | Source: Ambulatory Visit | Attending: Radiation Oncology | Admitting: Radiation Oncology

## 2017-03-06 ENCOUNTER — Ambulatory Visit: Payer: BLUE CROSS/BLUE SHIELD

## 2017-03-06 DIAGNOSIS — Z823 Family history of stroke: Secondary | ICD-10-CM | POA: Diagnosis not present

## 2017-03-06 DIAGNOSIS — Z801 Family history of malignant neoplasm of trachea, bronchus and lung: Secondary | ICD-10-CM | POA: Diagnosis not present

## 2017-03-06 DIAGNOSIS — Z8042 Family history of malignant neoplasm of prostate: Secondary | ICD-10-CM | POA: Diagnosis not present

## 2017-03-06 DIAGNOSIS — Z87442 Personal history of urinary calculi: Secondary | ICD-10-CM | POA: Diagnosis not present

## 2017-03-06 DIAGNOSIS — Z888 Allergy status to other drugs, medicaments and biological substances status: Secondary | ICD-10-CM | POA: Diagnosis not present

## 2017-03-06 DIAGNOSIS — Z85828 Personal history of other malignant neoplasm of skin: Secondary | ICD-10-CM | POA: Diagnosis not present

## 2017-03-06 DIAGNOSIS — Z800 Family history of malignant neoplasm of digestive organs: Secondary | ICD-10-CM | POA: Diagnosis not present

## 2017-03-06 DIAGNOSIS — Z803 Family history of malignant neoplasm of breast: Secondary | ICD-10-CM | POA: Diagnosis not present

## 2017-03-06 DIAGNOSIS — K219 Gastro-esophageal reflux disease without esophagitis: Secondary | ICD-10-CM | POA: Diagnosis not present

## 2017-03-06 DIAGNOSIS — D869 Sarcoidosis, unspecified: Secondary | ICD-10-CM | POA: Diagnosis not present

## 2017-03-06 DIAGNOSIS — Z86010 Personal history of colonic polyps: Secondary | ICD-10-CM | POA: Diagnosis not present

## 2017-03-06 DIAGNOSIS — Z9889 Other specified postprocedural states: Secondary | ICD-10-CM | POA: Diagnosis not present

## 2017-03-06 DIAGNOSIS — Z510 Encounter for antineoplastic radiation therapy: Secondary | ICD-10-CM | POA: Diagnosis not present

## 2017-03-06 DIAGNOSIS — G629 Polyneuropathy, unspecified: Secondary | ICD-10-CM | POA: Diagnosis not present

## 2017-03-06 DIAGNOSIS — Z9079 Acquired absence of other genital organ(s): Secondary | ICD-10-CM | POA: Diagnosis not present

## 2017-03-06 DIAGNOSIS — C61 Malignant neoplasm of prostate: Secondary | ICD-10-CM | POA: Diagnosis not present

## 2017-03-07 ENCOUNTER — Ambulatory Visit
Admission: RE | Admit: 2017-03-07 | Discharge: 2017-03-07 | Disposition: A | Discharge status: Home / Self Care | Payer: BLUE CROSS/BLUE SHIELD | Source: Ambulatory Visit | Attending: Radiation Oncology | Admitting: Radiation Oncology

## 2017-03-07 ENCOUNTER — Ambulatory Visit: Payer: BLUE CROSS/BLUE SHIELD

## 2017-03-07 DIAGNOSIS — G629 Polyneuropathy, unspecified: Secondary | ICD-10-CM | POA: Diagnosis not present

## 2017-03-07 DIAGNOSIS — Z9079 Acquired absence of other genital organ(s): Secondary | ICD-10-CM | POA: Diagnosis not present

## 2017-03-07 DIAGNOSIS — Z823 Family history of stroke: Secondary | ICD-10-CM | POA: Diagnosis not present

## 2017-03-07 DIAGNOSIS — K219 Gastro-esophageal reflux disease without esophagitis: Secondary | ICD-10-CM | POA: Diagnosis not present

## 2017-03-07 DIAGNOSIS — Z85828 Personal history of other malignant neoplasm of skin: Secondary | ICD-10-CM | POA: Diagnosis not present

## 2017-03-07 DIAGNOSIS — Z86010 Personal history of colonic polyps: Secondary | ICD-10-CM | POA: Diagnosis not present

## 2017-03-07 DIAGNOSIS — Z87442 Personal history of urinary calculi: Secondary | ICD-10-CM | POA: Diagnosis not present

## 2017-03-07 DIAGNOSIS — C61 Malignant neoplasm of prostate: Secondary | ICD-10-CM | POA: Diagnosis not present

## 2017-03-07 DIAGNOSIS — Z803 Family history of malignant neoplasm of breast: Secondary | ICD-10-CM | POA: Diagnosis not present

## 2017-03-07 DIAGNOSIS — Z801 Family history of malignant neoplasm of trachea, bronchus and lung: Secondary | ICD-10-CM | POA: Diagnosis not present

## 2017-03-07 DIAGNOSIS — Z510 Encounter for antineoplastic radiation therapy: Secondary | ICD-10-CM | POA: Diagnosis not present

## 2017-03-07 DIAGNOSIS — D869 Sarcoidosis, unspecified: Secondary | ICD-10-CM | POA: Diagnosis not present

## 2017-03-07 DIAGNOSIS — Z9889 Other specified postprocedural states: Secondary | ICD-10-CM | POA: Diagnosis not present

## 2017-03-07 DIAGNOSIS — Z800 Family history of malignant neoplasm of digestive organs: Secondary | ICD-10-CM | POA: Diagnosis not present

## 2017-03-07 DIAGNOSIS — Z888 Allergy status to other drugs, medicaments and biological substances status: Secondary | ICD-10-CM | POA: Diagnosis not present

## 2017-03-07 DIAGNOSIS — Z8042 Family history of malignant neoplasm of prostate: Secondary | ICD-10-CM | POA: Diagnosis not present

## 2017-03-08 ENCOUNTER — Ambulatory Visit
Admission: RE | Admit: 2017-03-08 | Discharge: 2017-03-08 | Disposition: A | Discharge status: Home / Self Care | Payer: BLUE CROSS/BLUE SHIELD | Source: Ambulatory Visit | Attending: Radiation Oncology | Admitting: Radiation Oncology

## 2017-03-08 ENCOUNTER — Encounter: Payer: Self-pay | Admitting: Radiation Oncology

## 2017-03-08 DIAGNOSIS — D869 Sarcoidosis, unspecified: Secondary | ICD-10-CM | POA: Diagnosis not present

## 2017-03-08 DIAGNOSIS — Z87442 Personal history of urinary calculi: Secondary | ICD-10-CM | POA: Diagnosis not present

## 2017-03-08 DIAGNOSIS — Z803 Family history of malignant neoplasm of breast: Secondary | ICD-10-CM | POA: Diagnosis not present

## 2017-03-08 DIAGNOSIS — Z85828 Personal history of other malignant neoplasm of skin: Secondary | ICD-10-CM | POA: Diagnosis not present

## 2017-03-08 DIAGNOSIS — K219 Gastro-esophageal reflux disease without esophagitis: Secondary | ICD-10-CM | POA: Diagnosis not present

## 2017-03-08 DIAGNOSIS — Z801 Family history of malignant neoplasm of trachea, bronchus and lung: Secondary | ICD-10-CM | POA: Diagnosis not present

## 2017-03-08 DIAGNOSIS — C61 Malignant neoplasm of prostate: Secondary | ICD-10-CM | POA: Diagnosis not present

## 2017-03-08 DIAGNOSIS — Z86010 Personal history of colonic polyps: Secondary | ICD-10-CM | POA: Diagnosis not present

## 2017-03-08 DIAGNOSIS — Z888 Allergy status to other drugs, medicaments and biological substances status: Secondary | ICD-10-CM | POA: Diagnosis not present

## 2017-03-08 DIAGNOSIS — Z8042 Family history of malignant neoplasm of prostate: Secondary | ICD-10-CM | POA: Diagnosis not present

## 2017-03-08 DIAGNOSIS — Z9889 Other specified postprocedural states: Secondary | ICD-10-CM | POA: Diagnosis not present

## 2017-03-08 DIAGNOSIS — Z823 Family history of stroke: Secondary | ICD-10-CM | POA: Diagnosis not present

## 2017-03-08 DIAGNOSIS — Z510 Encounter for antineoplastic radiation therapy: Secondary | ICD-10-CM | POA: Diagnosis not present

## 2017-03-08 DIAGNOSIS — Z9079 Acquired absence of other genital organ(s): Secondary | ICD-10-CM | POA: Diagnosis not present

## 2017-03-08 DIAGNOSIS — Z800 Family history of malignant neoplasm of digestive organs: Secondary | ICD-10-CM | POA: Diagnosis not present

## 2017-03-08 DIAGNOSIS — G629 Polyneuropathy, unspecified: Secondary | ICD-10-CM | POA: Diagnosis not present

## 2017-03-08 NOTE — Progress Notes (Signed)
  Radiation Oncology         [phone]) [phone] ________________________________  Name: Philip Hernandez MRN: [id number]  Date: 03/08/2017  DOB: 1956-09-08  End of Treatment Note  Diagnosis:   61 y.o. man s/p prostatectomy for stage pT2 adenocarcinoma with Gleason's 3+3 and rising detectable PSA of 0.26  Indication for treatment:  Curative, Prostatic Fossa Radiotherapy       Radiation treatment dates:   01/14/17 - 03/08/17  Site/dose:   The prostatic fossa was treated to 68.4 Gy in 38 fractions of 1.8 Gy  Beams/energy:   The prostatic fossa was treated using helical intensity modulated radiotherapy delivering 6 megavolt photons. Image guidance was performed with megavoltage CT studies prior to each fraction. He was immobilized with a body fix lower extremity mold.  Narrative: The patient tolerated radiation treatment relatively well. The patient had diarrhea x2-3 daily (for which he did not use Imodium), rectal irritation, fatigue, urinary freqency, urinary urgency, and occasional dysuria.  Plan: The patient has completed radiation treatment. He will return to radiation oncology clinic for routine followup in one month. I advised him to call or return sooner if he has any questions or concerns related to his recovery or treatment. ________________________________  Sheral Apley. Tammi Klippel, M.D.  This document serves as a record of services personally performed by Tyler Pita, MD. It was created on his behalf by Darcus Austin, a trained medical scribe. The creation of this record is based on the scribe's personal observations and the provider's statements to them. This document has been checked and approved by the attending provider.

## 2017-03-25 DIAGNOSIS — D225 Melanocytic nevi of trunk: Secondary | ICD-10-CM | POA: Diagnosis not present

## 2017-03-25 DIAGNOSIS — L570 Actinic keratosis: Secondary | ICD-10-CM | POA: Diagnosis not present

## 2017-03-25 DIAGNOSIS — L821 Other seborrheic keratosis: Secondary | ICD-10-CM | POA: Diagnosis not present

## 2017-03-25 DIAGNOSIS — L820 Inflamed seborrheic keratosis: Secondary | ICD-10-CM | POA: Diagnosis not present

## 2017-03-25 DIAGNOSIS — Z85828 Personal history of other malignant neoplasm of skin: Secondary | ICD-10-CM | POA: Diagnosis not present

## 2017-04-02 ENCOUNTER — Ambulatory Visit: Payer: BLUE CROSS/BLUE SHIELD | Admitting: Family Medicine

## 2017-04-03 ENCOUNTER — Encounter: Payer: Self-pay | Admitting: Family Medicine

## 2017-04-10 ENCOUNTER — Encounter: Payer: Self-pay | Admitting: Family Medicine

## 2017-04-10 ENCOUNTER — Ambulatory Visit (INDEPENDENT_AMBULATORY_CARE_PROVIDER_SITE_OTHER): Payer: BLUE CROSS/BLUE SHIELD | Admitting: Family Medicine

## 2017-04-10 VITALS — BP 111/75 | HR 65 | Temp 97.3°F | Ht 70.0 in | Wt 168.8 lb

## 2017-04-10 DIAGNOSIS — R5383 Other fatigue: Secondary | ICD-10-CM | POA: Diagnosis not present

## 2017-04-10 DIAGNOSIS — M545 Low back pain, unspecified: Secondary | ICD-10-CM

## 2017-04-10 DIAGNOSIS — L603 Nail dystrophy: Secondary | ICD-10-CM

## 2017-04-10 NOTE — Patient Instructions (Signed)
Great to see you!  We will work on a referral for podiatry  We will let you know about your labs.   Try to add in more activity to your day, try to get back to the back exercise.

## 2017-04-10 NOTE — Progress Notes (Signed)
   HPI  Patient presents today for follow-up chronic medical conditions and fatigue.  Patient states that he's finished radiation a few weeks ago and has continued fatigue. His wife sterilized and very different over the last few months, he's been sitting with his parents on most days. He normally works around the house and stays busy at home, however now he is helping his mother around the house and states that he spends several hours a day sitting with his parents.  He has mild low back pain, he feels this is likely due to sitting frequently.  Complains of misshapen left second toenail. This is been going on for several months, he would like to have it addressed.   PMH: Smoking status noted ROS: Per HPI  Objective: BP 111/75   Pulse 65   Temp (!) 97.3 F (36.3 C) (Oral)   Ht '5\' 10"'$  (1.778 m)   Wt 168 lb 12.8 oz (76.6 kg)   BMI 24.22 kg/m  Gen: NAD, alert, cooperative with exam HEENT: NCATL CV: RRR, good S1/S2, no murmur Resp: CTABL, no wheezes, non-labored Ext: No edema, warm, L 2nd toenail thickened, yellow and medially angulated Neuro: Alert and oriented, No gross deficits   Assessment and plan:  # Fatigue Likely multifactorial fatigue from deconditioning and possible contribution of recent radiation therapy. Basic labs Watchful waiting  # Dystrophic nail Left second toe with dystrophic nail Refer to podiatry  Back pain Mild to moderate pain, recommended restarting exercises he was previously taught in physical therapy. He is agreeable with this plan. No red flags.    Orders Placed This Encounter  Procedures  . CMP14+EGFR  . CBC with Differential/Platelet  . TSH  . Ambulatory referral to Podiatry    Referral Priority:   Routine    Referral Type:   Consultation    Referral Reason:   Specialty Services Required    Requested Specialty:   Podiatry    Number of Visits Requested:   Celina, MD [facility] 04/10/2017,  11:39 AM

## 2017-04-11 ENCOUNTER — Encounter: Payer: Self-pay | Admitting: Family Medicine

## 2017-04-11 LAB — CMP14+EGFR
A/G RATIO: 1.9 (ref 1.2–2.2)
ALT: 15 IU/L (ref 0–44)
AST: 21 IU/L (ref 0–40)
Albumin: 4.2 g/dL (ref 3.5–5.5)
Alkaline Phosphatase: 66 IU/L (ref 39–117)
BUN/Creatinine Ratio: 13 (ref 9–20)
BUN: 16 mg/dL (ref 6–24)
Bilirubin Total: 0.8 mg/dL (ref 0.0–1.2)
CO2: 24 mmol/L (ref 20–29)
Calcium: 9.4 mg/dL (ref 8.7–10.2)
Chloride: 103 mmol/L (ref 96–106)
Creatinine, Ser: 1.21 mg/dL (ref 0.76–1.27)
GFR calc Af Amer: 75 mL/min/{1.73_m2} (ref >59)
GFR calc non Af Amer: 65 mL/min/{1.73_m2} (ref >59)
GLOBULIN, TOTAL: 2.2 g/dL (ref 1.5–4.5)
Glucose: 87 mg/dL (ref 65–99)
POTASSIUM: 4.9 mmol/L (ref 3.5–5.2)
SODIUM: 140 mmol/L (ref 134–144)
TOTAL PROTEIN: 6.4 g/dL (ref 6.0–8.5)

## 2017-04-11 LAB — CBC WITH DIFFERENTIAL/PLATELET
BASOS: 1 %
Basophils Absolute: 0 10*3/uL (ref 0.0–0.2)
EOS (ABSOLUTE): 0.4 10*3/uL (ref 0.0–0.4)
Eos: 6 %
Hematocrit: 40.9 % (ref 37.5–51.0)
Hemoglobin: 13.8 g/dL (ref 13.0–17.7)
Immature Grans (Abs): 0 10*3/uL (ref 0.0–0.1)
Immature Granulocytes: 0 %
LYMPHS ABS: 1 10*3/uL (ref 0.7–3.1)
Lymphs: 18 %
MCH: 29.2 pg (ref 26.6–33.0)
MCHC: 33.7 g/dL (ref 31.5–35.7)
MCV: 87 fL (ref 79–97)
Monocytes Absolute: 0.7 10*3/uL (ref 0.1–0.9)
Monocytes: 13 %
NEUTROS ABS: 3.5 10*3/uL (ref 1.4–7.0)
Neutrophils: 62 %
PLATELETS: 114 10*3/uL — AB (ref 150–379)
RBC: 4.73 x10E6/uL (ref 4.14–5.80)
RDW: 14.2 % (ref 12.3–15.4)
WBC: 5.6 10*3/uL (ref 3.4–10.8)

## 2017-04-11 LAB — TSH: TSH: 2.87 u[IU]/mL (ref 0.450–4.500)

## 2017-04-23 NOTE — Progress Notes (Signed)
Radiation Oncology         [phone] ________________________________  Name: Wanya Bangura MRN: [id number]  Date: 04/24/2017  DOB: Jun 07, 1956  Post Treatment Note  CC: Timmothy Euler, MD  Raynelle Bring, MD  Diagnosis:  61 y.o. man s/p prostatectomy for stage pT2 adenocarcinoma with Gleason's 3+3 and rising detectable PSA of 0.26  Interval Since Last Radiation:  6 weeks  01/14/17 - 03/08/17:  The prostatic fossa was treated to 68.4 Gy in 38 fractions of 1.8 Gy  Narrative:  The patient returns today for routine follow-up.   He tolerated radiation treatment relatively well. The patient had diarrhea x2-3 daily (for which he did not use Imodium), rectal irritation, fatigue, urinary freqency, urinary urgency, and occasional dysuria.                              On review of systems, the patient states that he is doing very well in general. He does continue with urinary and bowel urgency but is otherwise without complaint. He specifically denies dysuria, gross hematuria, excessive daytime or nighttime frequency, incomplete bladder emptying or weak flow of stream. He denies abdominal pain, nausea, vomiting or diarrhea. He reports a healthy appetite and is maintaining his weight. He has noticed a gradual return of his energy.  ALLERGIES:  is allergic to corn-containing products.  Meds: Current Outpatient Prescriptions  Medication Sig Dispense Refill  . acetaminophen (TYLENOL) 500 MG tablet Take 500 mg by mouth every 6 (six) hours as needed.    . cetirizine (ZYRTEC) 10 MG tablet Take 10 mg by mouth daily.    . cholecalciferol (VITAMIN D) 1000 UNITS tablet Take 1,000 Units by mouth daily.      Marland Kitchen gabapentin (NEURONTIN) 800 MG tablet TAKE 1 TABLET BY MOUTH 4  TIMES DAILY 360 tablet 3  . Multiple Vitamins-Minerals (AIRBORNE PO) Take by mouth daily.    Marland Kitchen omeprazole (PRILOSEC) 40 MG capsule TAKE ONE (1) CAPSULE EACH DAY 30 capsule 5  . benzonatate (TESSALON PERLES) 100 MG capsule Take 1 capsule  (100 mg total) by mouth 3 (three) times daily as needed for cough. (Patient not taking: Reported on 04/24/2017) 20 capsule 0  . topiramate (TOPAMAX) 25 MG tablet Take 1 tablet (25 mg total) by mouth 2 (two) times daily. (Patient not taking: Reported on 04/24/2017) 120 tablet 3   No current facility-administered medications for this encounter.     Physical Findings:  height is 5\' 10"  (1.778 m) and weight is 170 lb (77.1 kg). His oral temperature is 98.1 F (36.7 C). His blood pressure is 124/80 and his pulse is 57 (abnormal). His respiration is 18 and oxygen saturation is 99%.  Pain Assessment Pain Score: 0-No pain/10 In general this is a well appearing Caucasian male in no acute distress. He's alert and oriented x4 and appropriate throughout the examination. Cardiopulmonary assessment is negative for acute distress and he exhibits normal effort.   Lab Findings: Lab Results  Component Value Date   WBC 5.6 04/10/2017   HGB 13.8 04/10/2017   HCT 40.9 04/10/2017   MCV 87 04/10/2017   PLT 114 (L) 04/10/2017     Radiographic Findings: No results found.  Impression/Plan: 74. 61 y.o. man s/p prostatectomy for stage pT2 adenocarcinoma with Gleason's 3+3 and rising detectable PSA of 0.26. He will continue to follow up with urology for ongoing PSA determinations and has an appointment scheduled with Dr. Alinda Money in October 2018. He  understands what to expect with regards to PSA monitoring going forward. I will look forward to following his response to treatment via correspondence with urology, and would be happy to continue to participate in his care if clinically indicated. I talked to the patient about what to expect in the future, including his risk for erectile dysfunction rectal bleeding. I encouraged him to call or return to the office if he has any questions regarding his previous radiation or possible radiation side effects. He was comfortable with this plan and will follow up as  needed.    Nicholos Johns, PA-C

## 2017-04-24 ENCOUNTER — Ambulatory Visit
Admission: RE | Admit: 2017-04-24 | Discharge: 2017-04-24 | Disposition: A | Discharge status: Home / Self Care | Payer: BLUE CROSS/BLUE SHIELD | Source: Ambulatory Visit | Attending: Radiation Oncology | Admitting: Radiation Oncology

## 2017-04-24 ENCOUNTER — Encounter: Payer: Self-pay | Admitting: Urology

## 2017-04-24 VITALS — BP 124/80 | HR 57 | Temp 98.1°F | Resp 18 | Ht 70.0 in | Wt 170.0 lb

## 2017-04-24 DIAGNOSIS — Z8546 Personal history of malignant neoplasm of prostate: Secondary | ICD-10-CM | POA: Diagnosis not present

## 2017-04-24 DIAGNOSIS — C61 Malignant neoplasm of prostate: Secondary | ICD-10-CM | POA: Diagnosis present

## 2017-04-24 DIAGNOSIS — Z08 Encounter for follow-up examination after completed treatment for malignant neoplasm: Secondary | ICD-10-CM | POA: Diagnosis not present

## 2017-04-29 NOTE — Addendum Note (Signed)
Encounter addended by: Malena Edman, RN on: 04/29/2017 10:11 AM<BR>    Actions taken: Visit Navigator Flowsheet section accepted

## 2017-04-30 DIAGNOSIS — B351 Tinea unguium: Secondary | ICD-10-CM | POA: Diagnosis not present

## 2017-05-21 DIAGNOSIS — L03032 Cellulitis of left toe: Secondary | ICD-10-CM | POA: Diagnosis not present

## 2017-06-21 DIAGNOSIS — C61 Malignant neoplasm of prostate: Secondary | ICD-10-CM | POA: Diagnosis not present

## 2017-06-28 DIAGNOSIS — C61 Malignant neoplasm of prostate: Secondary | ICD-10-CM | POA: Diagnosis not present

## 2017-07-08 ENCOUNTER — Ambulatory Visit (INDEPENDENT_AMBULATORY_CARE_PROVIDER_SITE_OTHER): Payer: BLUE CROSS/BLUE SHIELD | Admitting: *Deleted

## 2017-07-08 DIAGNOSIS — Z23 Encounter for immunization: Secondary | ICD-10-CM | POA: Diagnosis not present

## 2017-07-16 ENCOUNTER — Encounter: Payer: Self-pay | Admitting: *Deleted

## 2017-07-30 DIAGNOSIS — M79676 Pain in unspecified toe(s): Secondary | ICD-10-CM | POA: Diagnosis not present

## 2017-07-30 DIAGNOSIS — B351 Tinea unguium: Secondary | ICD-10-CM | POA: Diagnosis not present

## 2017-08-05 ENCOUNTER — Encounter: Payer: Self-pay | Admitting: Adult Health

## 2017-08-05 ENCOUNTER — Ambulatory Visit (INDEPENDENT_AMBULATORY_CARE_PROVIDER_SITE_OTHER): Payer: BLUE CROSS/BLUE SHIELD | Admitting: Adult Health

## 2017-08-05 VITALS — BP 134/70 | HR 64 | Wt 175.0 lb

## 2017-08-05 DIAGNOSIS — R202 Paresthesia of skin: Secondary | ICD-10-CM

## 2017-08-05 NOTE — Patient Instructions (Signed)
Your Plan:  Continue gabapentin and Topamax If your symptoms worsen or you develop new symptoms please let us know.    Thank you for coming to see [place] at [facility]. I hope we have been able to provide you high quality care today.  You may receive a patient satisfaction survey over the next few weeks. We would appreciate your feedback and comments so that we may continue to improve ourselves and the health of our patients.

## 2017-08-05 NOTE — Progress Notes (Addendum)
PATIENT: Philip Hernandez DOB: Nov 11, 1955  REASON FOR VISIT: follow up- paresthesia feet HISTORY FROM: patient  HISTORY OF PRESENT ILLNESS: Today 08/05/17  Philip Hernandez is a 61 year old male with a history of paresthesias in the feet.  He returns today for follow-up.  He remains on gabapentin 800 mg 4 times a day.  He states that he has been taking Topamax only for breakthrough pain.  He states that he has burning and tingling pain primarily in the feet.  He reports that he does have some numbness that extends up the leg.  He reports when he is active he tends to not notice the discomfort.  He states when he sits down the rest that is when his pain is more severe.  The patient reports that he completed treatment for prostate cancer.  However his PSA results still indicates that the cancer is present.  But he reports that he has another PSA in January and pending that report will determine any further treatment.  He returns today for follow-up.   HISTORY 01/30/2017: Philip Hernandez is a 61 year old male with a history of paresthesias in the feet. He returns today for follow-up. He continues on gabapentin 800 mg 4 times a day. He was given a prescription of Topamax to take 25 mg twice a day. He reports that he has not started this. States that he found out that his prostate cancer has returned. He started radiation treatment April 30 and this will continue until June 21. He states that he may consider starting this after his radiation is completed. He states that he still has burning and tingling sensation in the feet and legs. He states he recently he's been having a throbbing sensation on the top of the right foot that can last approximately 1 minute. He states this is not consistent and does not occur daily. He denies any significant changes with his gait or balance. Reports that at times  he is very staggery but no falls. He also reports numbness in the fingertips and will occasionally drop things. He returns  today for an evaluation.    REVIEW OF SYSTEMS: Out of a complete 14 system review of symptoms, the patient complains only of the following symptoms, and all other reviewed systems are negative.  Hearing loss, runny nose, chest pain daytime sleepiness, testicular pain, back pain, muscle cramps, walking difficulty with neck stiffness, memory loss, numbness, weakness  ALLERGIES: Allergies  Allergen Reactions  . Corn-Containing Products     HOME MEDICATIONS: Outpatient Medications Prior to Visit  Medication Sig Dispense Refill  . acetaminophen (TYLENOL) 500 MG tablet Take 500 mg by mouth every 6 (six) hours as needed.    . cetirizine (ZYRTEC) 10 MG tablet Take 10 mg by mouth daily.    . cholecalciferol (VITAMIN D) 1000 UNITS tablet Take 1,000 Units by mouth daily.      Marland Kitchen gabapentin (NEURONTIN) 800 MG tablet TAKE 1 TABLET BY MOUTH 4  TIMES DAILY 360 tablet 3  . Multiple Vitamins-Minerals (AIRBORNE PO) Take by mouth daily.    Marland Kitchen omeprazole (PRILOSEC) 40 MG capsule TAKE ONE (1) CAPSULE EACH DAY 30 capsule 5  . topiramate (TOPAMAX) 25 MG tablet Take 1 tablet (25 mg total) by mouth 2 (two) times daily. 120 tablet 3  . benzonatate (TESSALON PERLES) 100 MG capsule Take 1 capsule (100 mg total) by mouth 3 (three) times daily as needed for cough. (Patient not taking: Reported on 04/24/2017) 20 capsule 0   No facility-administered medications  prior to visit.     PAST MEDICAL HISTORY: Past Medical History:  Diagnosis Date  . Colon polyp   . Colon polyps 2015  . Cough    chronic sinus issues, GERD with poor dietary control and Pulmonary Sarcoid.   Marland Kitchen GERD (gastroesophageal reflux disease) Jan 2012   [name]. Bx proven.  Deve abd pain in Jan 2012 following high dose steroids for sarcoid. s/p EGD and improvement in symptoms following PPI  . Hypogonadism male   . Kidney stone   . Malignant neoplasm of prostate [facility]) dec 2010   [name]. s/p surgery dec 2010, hx of rnormal PSA   since then  . Other and unspecified mycoses   . Painful respiration   . Peripheral neuropathy   . Prostate cancer ([place])    reoccurence (getting radiation 01-14-17 start).  . Sarcoidosis dec 2011   stage 2 pulmonary, neck nodes, splenic granuloma  . Skin cancer   . Swelling, mass, or lump in head and neck     PAST SURGICAL HISTORY: Past Surgical History:  Procedure Laterality Date  . HERNIA REPAIR  13 yrs ago  . LYMPH NODE BIOPSY  05/2010, 07/2010  . nasal polyps  1984  . NASAL SINUS SURGERY  2013  . PROSTATE SURGERY  2010   radiation 2018  . SHOULDER SURGERY Right 2009, 2010   x 2  . SKIN CANCER EXCISION  11/16/14    FAMILY HISTORY: Family History  Problem Relation Age of Onset  . Colon cancer Mother 47  . Skin cancer Mother   . Cancer Mother        colon  . Colon cancer Brother 10  . Skin cancer Brother   . Cancer Brother        colon  . Colon cancer Maternal Aunt        dx in her 21s  . Cancer Maternal Aunt        "cancer of male organs" dx in her 36s  . Breast cancer Maternal Aunt 66       bilateral, dx again at 27  . Colon cancer Maternal Aunt 53  . Stomach cancer Maternal Aunt 68  . Stomach cancer Maternal Uncle 9  . Skin cancer Paternal Aunt   . Prostate cancer Paternal Uncle 26  . Cancer Paternal Uncle        prostate  . Stomach cancer Paternal Grandmother        dx in her 63s  . Stroke Paternal Grandfather   . Colon cancer Maternal Aunt 68  . Prostate cancer Paternal Uncle   . Cancer Paternal Uncle        prostate  . Prostate cancer Paternal Uncle   . Kidney cancer Paternal Uncle   . Lung cancer Paternal Uncle   . Cancer Paternal Uncle        prostate  . Lung cancer Paternal Uncle   . Cancer Paternal Uncle        unknown  . Cancer Paternal Uncle        prostate  . Cancer Cousin        paternal cousin with cancer NOS  . Cancer Cousin        paternal cousin with cancer NOS    SOCIAL HISTORY: Social History   Socioeconomic History  .  Marital status: Married    Spouse name: Not on file  . Number of children: 2  . Years of education: Not on file  . Highest  education level: Not on file  Social Needs  . Financial resource strain: Not on file  . Food insecurity - worry: Not on file  . Food insecurity - inability: Not on file  . Transportation needs - medical: Not on file  . Transportation needs - non-medical: Not on file  Occupational History  . Occupation: unemployed  Tobacco Use  . Smoking status: Never Smoker  . Smokeless tobacco: Never Used  Substance and Sexual Activity  . Alcohol use: No  . Drug use: No  . Sexual activity: Yes  Other Topics Concern  . Not on file  Social History Narrative   Married   2 children      PHYSICAL EXAM  Vitals:   08/05/17 0916  BP: 134/70  Pulse: 64  Weight: 175 lb (79.4 kg)   Body mass index is 25.11 kg/m.  Generalized: Well developed, in no acute distress   Neurological examination  Mentation: Alert oriented to time, place, history taking. Follows all commands speech and language fluent Cranial nerve II-XII: Pupils were equal round reactive to light. Extraocular movements were full, visual field were full on confrontational test. Facial sensation and strength were normal. Uvula tongue midline. Head turning and shoulder shrug  were normal and symmetric. Motor: The motor testing reveals 5 over 5 strength of all 4 extremities. Good symmetric motor tone is noted throughout.  Sensory: Sensory testing is intact to soft touch on all 4 extremities.  Decreased sensation to pinprick in the lower extremities in a stocking-like pattern.  No evidence of extinction is noted.  Coordination: Cerebellar testing reveals good finger-nose-finger and heel-to-shin bilaterally.  Gait and station: Gait is slightly wide-based.  Tandem gait n is unsteady.  Reflexes: Deep tendon reflexes are symmetric and normal bilaterally.   DIAGNOSTIC DATA (LABS, IMAGING, TESTING) - I reviewed patient  records, labs, notes, testing and imaging myself where available.  Lab Results  Component Value Date   WBC 5.6 04/10/2017   HGB 13.8 04/10/2017   HCT 40.9 04/10/2017   MCV 87 04/10/2017   PLT 114 (L) 04/10/2017      Component Value Date/Time   NA 140 04/10/2017 1005   K 4.9 04/10/2017 1005   CL 103 04/10/2017 1005   CO2 24 04/10/2017 1005   GLUCOSE 87 04/10/2017 1005   GLUCOSE 80 03/26/2013 1220   BUN 16 04/10/2017 1005   CREATININE 1.21 04/10/2017 1005   CREATININE 1.15 03/26/2013 1220   CALCIUM 9.4 04/10/2017 1005   PROT 6.4 04/10/2017 1005   ALBUMIN 4.2 04/10/2017 1005   AST 21 04/10/2017 1005   ALT 15 04/10/2017 1005   ALKPHOS 66 04/10/2017 1005   BILITOT 0.8 04/10/2017 1005   GFRNONAA 65 04/10/2017 1005   GFRNONAA 71 03/26/2013 1220   GFRAA 75 04/10/2017 1005   GFRAA 82 03/26/2013 1220   Lab Results  Component Value Date   CHOL 183 11/30/2016   HDL 38 (L) 11/30/2016   LDLCALC 120 (H) 11/30/2016   TRIG 125 11/30/2016   CHOLHDL 4.8 11/30/2016    Lab Results  Component Value Date   TSH 2.870 04/10/2017      ASSESSMENT AND PLAN 61 y.o. year old male  has a past medical history of Colon polyp, Colon polyps (2015), Cough, GERD (gastroesophageal reflux disease) (Jan 2012), Hypogonadism male, Kidney stone, Malignant neoplasm of prostate ([place]) (dec 2010), Other and unspecified mycoses, Painful respiration, Peripheral neuropathy, Prostate cancer ([place]), Sarcoidosis (dec 2011), Skin cancer, and Swelling, mass, or lump in head and  neck. here with:  1.  Paresthesias of the feet  Patient will continue on gabapentin 800 mg 4 times a day.  I have encouraged the patient to try to take Topamax 25 twice a day consistently as this may prevent his discomfort.  Patient voiced understanding.  I have advised that if his symptoms worsen or he develops new symptoms he should let us know.  He will follow-up in 6 months or sooner if needed.  I spent 15 minutes with the patient. 50% of  this time was spent discussing his symptoms.    [name], MSN, NP-C 08/05/2017, 9:30 AM [facility] [address], [place], [place] [address] [phone]  I reviewed the above note and documentation by the Nurse Practitioner and agree with the history, physical exam, assessment and plan as outlined above. I was immediately available for face-to-face consultation. Star Age, MD, PhD [facility])

## 2017-08-13 ENCOUNTER — Ambulatory Visit: Payer: BLUE CROSS/BLUE SHIELD | Admitting: Family Medicine

## 2017-08-14 ENCOUNTER — Ambulatory Visit (INDEPENDENT_AMBULATORY_CARE_PROVIDER_SITE_OTHER): Payer: BLUE CROSS/BLUE SHIELD | Admitting: Family Medicine

## 2017-08-14 ENCOUNTER — Encounter: Payer: Self-pay | Admitting: Family Medicine

## 2017-08-14 VITALS — BP 124/74 | HR 58 | Temp 96.9°F | Ht 70.0 in | Wt 173.0 lb

## 2017-08-14 DIAGNOSIS — D696 Thrombocytopenia, unspecified: Secondary | ICD-10-CM

## 2017-08-14 DIAGNOSIS — H9193 Unspecified hearing loss, bilateral: Secondary | ICD-10-CM

## 2017-08-14 DIAGNOSIS — E785 Hyperlipidemia, unspecified: Secondary | ICD-10-CM | POA: Diagnosis not present
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
## 2017-08-14 NOTE — Patient Instructions (Signed)
Great to see you!  Come back in 4 months unless you need us sooner.    

## 2017-08-15 ENCOUNTER — Ambulatory Visit: Payer: BLUE CROSS/BLUE SHIELD | Admitting: Family Medicine

## 2017-08-15 LAB — CMP14+EGFR
A/G RATIO: 1.8 (ref 1.2–2.2)
ALT: 16 IU/L (ref 0–44)
AST: 20 IU/L (ref 0–40)
Albumin: 4.3 g/dL (ref 3.6–4.8)
Alkaline Phosphatase: 69 IU/L (ref 39–117)
BUN/Creatinine Ratio: 11 (ref 10–24)
BUN: 14 mg/dL (ref 8–27)
Bilirubin Total: 0.8 mg/dL (ref 0.0–1.2)
CALCIUM: 9.6 mg/dL (ref 8.6–10.2)
CHLORIDE: 106 mmol/L (ref 96–106)
CO2: 23 mmol/L (ref 20–29)
Creatinine, Ser: 1.3 mg/dL — ABNORMAL HIGH (ref 0.76–1.27)
GFR calc Af Amer: 69 mL/min/{1.73_m2} (ref >59)
GFR, EST NON AFRICAN AMERICAN: 59 mL/min/{1.73_m2} — AB (ref >59)
GLOBULIN, TOTAL: 2.4 g/dL (ref 1.5–4.5)
Glucose: 79 mg/dL (ref 65–99)
POTASSIUM: 4.6 mmol/L (ref 3.5–5.2)
SODIUM: 144 mmol/L (ref 134–144)
Total Protein: 6.7 g/dL (ref 6.0–8.5)

## 2017-08-15 LAB — CBC WITH DIFFERENTIAL/PLATELET
BASOS ABS: 0 10*3/uL (ref 0.0–0.2)
BASOS: 1 %
EOS (ABSOLUTE): 0.2 10*3/uL (ref 0.0–0.4)
Eos: 4 %
Hematocrit: 39.7 % (ref 37.5–51.0)
Hemoglobin: 14.1 g/dL (ref 13.0–17.7)
Immature Grans (Abs): 0 10*3/uL (ref 0.0–0.1)
Immature Granulocytes: 0 %
Lymphocytes Absolute: 1.4 10*3/uL (ref 0.7–3.1)
Lymphs: 24 %
MCH: 29.9 pg (ref 26.6–33.0)
MCHC: 35.5 g/dL (ref 31.5–35.7)
MCV: 84 fL (ref 79–97)
MONOS ABS: 0.7 10*3/uL (ref 0.1–0.9)
Monocytes: 11 %
NEUTROS ABS: 3.7 10*3/uL (ref 1.4–7.0)
Neutrophils: 60 %
PLATELETS: 129 10*3/uL — AB (ref 150–379)
RBC: 4.72 x10E6/uL (ref 4.14–5.80)
RDW: 13.6 % (ref 12.3–15.4)
WBC: 6 10*3/uL (ref 3.4–10.8)

## 2017-08-15 LAB — LIPID PANEL
Chol/HDL Ratio: 4.2 ratio (ref 0.0–5.0)
Cholesterol, Total: 174 mg/dL (ref 100–199)
HDL: 41 mg/dL (ref >39)
LDL CALC: 112 mg/dL — AB (ref 0–99)
TRIGLYCERIDES: 105 mg/dL (ref 0–149)
VLDL Cholesterol Cal: 21 mg/dL (ref 5–40)

## 2017-10-03 DIAGNOSIS — C61 Malignant neoplasm of prostate: Secondary | ICD-10-CM | POA: Diagnosis not present

## 2017-10-19 ENCOUNTER — Ambulatory Visit (INDEPENDENT_AMBULATORY_CARE_PROVIDER_SITE_OTHER): Payer: BLUE CROSS/BLUE SHIELD | Admitting: Family Medicine

## 2017-10-19 VITALS — BP 127/81 | HR 74 | Temp 98.4°F | Ht 70.0 in | Wt 173.0 lb

## 2017-10-19 DIAGNOSIS — J209 Acute bronchitis, unspecified: Secondary | ICD-10-CM

## 2017-10-19 DIAGNOSIS — J189 Pneumonia, unspecified organism: Secondary | ICD-10-CM

## 2017-10-19 MED ORDER — AZITHROMYCIN 250 MG PO TABS: ORAL_TABLET | ORAL | 0 refills | Status: DC

## 2017-10-19 MED ORDER — BENZONATATE 100 MG PO CAPS: 100.0000 mg | ORAL_CAPSULE | Freq: Three times a day (TID) | ORAL | 0 refills | Status: DC | PRN

## 2017-10-19 MED ORDER — METHYLPREDNISOLONE ACETATE 80 MG/ML IJ SUSP
80.0000 mg | Freq: Once | INTRAMUSCULAR | Status: AC
  Administered 2017-10-19: 80 mg via INTRAMUSCULAR

## 2017-10-19 NOTE — Addendum Note (Signed)
Addended by: Timmothy Euler on: 10/19/2017 10:00 AM   Modules accepted: Orders

## 2017-10-19 NOTE — Progress Notes (Signed)
   HPI  Patient presents today here for acute illness.  Hoarseness, sore throat, cough productive of dark sputum, chest tightness, and malaise.  He is tolerating food and fluids like usual. He denies any significant shortness of breath. He does not have any sick contacts.  PMH: Smoking status noted ROS: Per HPI  Objective: BP 127/81   Pulse 74   Temp 98.4 F (36.9 C) (Oral)   Ht 5\' 10"  (1.778 m)   Wt 173 lb (78.5 kg)   BMI 24.82 kg/m  Gen: NAD, alert, cooperative with exam HEENT: NCAT, pharynx moist and clear, nares clear, TMs normal bilaterally, no tenderness to palpation of the sinuses bilaterally CV: RRR, good S1/S2, no murmur Resp: CTABL, no wheezes, non-labored Ext: No edema, warm Neuro: Alert and oriented, No gross deficits  Assessment and plan:  #Walking pneumonia Patient with persistent cough and chest tightness with malaise Given time course and worsening symptoms I recommended going ahead and proceeding with IM Depo-Medrol plus azithromycin Depo-Medrol given as he likely has some viral component with hoarseness versus postnasal drip.     Meds ordered this encounter  Medications  . methylPREDNISolone acetate (DEPO-MEDROL) injection 80 mg  . azithromycin (ZITHROMAX) 250 MG tablet    Sig: Take 2 tablets on day 1 and 1 tablet daily after that    Dispense:  6 tablet    Refill:  0    Laroy Apple, MD [facility] 10/19/2017, 9:46 AM

## 2017-11-19 ENCOUNTER — Encounter: Payer: Self-pay | Admitting: Family Medicine

## 2017-11-19 ENCOUNTER — Ambulatory Visit (INDEPENDENT_AMBULATORY_CARE_PROVIDER_SITE_OTHER): Payer: BLUE CROSS/BLUE SHIELD | Admitting: Family Medicine

## 2017-11-19 VITALS — BP 113/73 | HR 108 | Temp 103.0°F | Ht 70.0 in | Wt 170.0 lb

## 2017-11-19 DIAGNOSIS — R6889 Other general symptoms and signs: Secondary | ICD-10-CM | POA: Diagnosis not present

## 2017-11-19 MED ORDER — OSELTAMIVIR PHOSPHATE 75 MG PO CAPS: 75.0000 mg | ORAL_CAPSULE | Freq: Two times a day (BID) | ORAL | 0 refills | Status: DC

## 2017-11-19 NOTE — Patient Instructions (Signed)

## 2017-11-19 NOTE — Progress Notes (Signed)
BP 113/73   Pulse (!) 108   Temp (!) 103 F (39.4 C) (Oral)   Ht 5\' 10"  (1.778 m)   Wt 170 lb (77.1 kg)   BMI 24.39 kg/m    Subjective:    Patient ID: Philip Hernandez, male    DOB: [birth year], 62 y.o.   MRN: [id number]  HPI: Philip Hernandez is a 62 y.o. male presenting on 11/19/2017 for Cough, headache, congestion, body aches, fever,, chills (began with headache and cough last Friday, other symptoms began on Sunday) and Sore Throat   HPI Cough and congestion and sore throat and fever Patient comes in with cough and congestion and sore throat and fever that is been going on for the past few days but the fever just started yesterday.  He has had a little bit of cough that had been going on but it really picked up yesterday.  He denies any sick contacts that he knows of.  He has been using Tylenol and ibuprofen which have helped to bring the fever down for the most part.  He said he is also been having headaches and a productive cough.  Relevant past medical, surgical, family and social history reviewed and updated as indicated. Interim medical history since our last visit reviewed. Allergies and medications reviewed and updated.  Review of Systems  Constitutional: Positive for fever. Negative for chills.  HENT: Positive for congestion, postnasal drip, rhinorrhea, sinus pressure, sneezing and sore throat. Negative for ear discharge, ear pain and voice change.   Eyes: Negative for pain, discharge, redness and visual disturbance.  Respiratory: Positive for cough. Negative for chest tightness, shortness of breath and wheezing.   Cardiovascular: Negative for chest pain and leg swelling.  Gastrointestinal: Negative for abdominal pain, constipation and diarrhea.  Genitourinary: Negative for difficulty urinating.  Musculoskeletal: Positive for myalgias. Negative for back pain and gait problem.  Skin: Negative for rash.  Neurological: Negative for syncope, light-headedness and headaches.  All other  systems reviewed and are negative.   Per HPI unless specifically indicated above   Allergies as of 11/19/2017      Reactions   Corn-containing Products       Medication List        Accurate as of 11/19/17  5:25 PM. Always use your most recent med list.          acetaminophen 500 MG tablet Commonly known as:  TYLENOL Take 500 mg by mouth every 6 (six) hours as needed.   AIRBORNE PO Take by mouth daily.   benzonatate 100 MG capsule Commonly known as:  TESSALON PERLES Take 1 capsule (100 mg total) by mouth 3 (three) times daily as needed for cough.   cetirizine 10 MG tablet Commonly known as:  ZYRTEC Take 10 mg by mouth daily.   cholecalciferol 1000 units tablet Commonly known as:  VITAMIN D Take 1,000 Units by mouth daily.   gabapentin 800 MG tablet Commonly known as:  NEURONTIN TAKE 1 TABLET BY MOUTH 4  TIMES DAILY   omeprazole 40 MG capsule Commonly known as:  PRILOSEC TAKE ONE (1) CAPSULE EACH DAY   oseltamivir 75 MG capsule Commonly known as:  TAMIFLU Take 1 capsule (75 mg total) by mouth 2 (two) times daily.   topiramate 25 MG tablet Commonly known as:  TOPAMAX Take 1 tablet (25 mg total) by mouth 2 (two) times daily.          Objective:    BP 113/73   Pulse (!) 108  Temp (!) 103 F (39.4 C) (Oral)   Ht 5\' 10"  (1.778 m)   Wt 170 lb (77.1 kg)   BMI 24.39 kg/m   Wt Readings from Last 3 Encounters:  11/19/17 170 lb (77.1 kg)  10/19/17 173 lb (78.5 kg)  08/14/17 173 lb (78.5 kg)    Physical Exam  Constitutional: He is oriented to person, place, and time. He appears well-developed and well-nourished. No distress.  HENT:  Right Ear: Tympanic membrane, external ear and ear canal normal.  Left Ear: Tympanic membrane, external ear and ear canal normal.  Nose: Mucosal edema and rhinorrhea present. No sinus tenderness. No epistaxis. Right sinus exhibits maxillary sinus tenderness. Right sinus exhibits no frontal sinus tenderness. Left sinus exhibits  maxillary sinus tenderness. Left sinus exhibits no frontal sinus tenderness.  Mouth/Throat: Uvula is midline and mucous membranes are normal. Posterior oropharyngeal edema and posterior oropharyngeal erythema present. No oropharyngeal exudate or tonsillar abscesses.  Eyes: Conjunctivae and EOM are normal. Pupils are equal, round, and reactive to light. No scleral icterus.  Neck: Neck supple. No thyromegaly present.  Cardiovascular: Normal rate, regular rhythm, normal heart sounds and intact distal pulses.  No murmur heard. Pulmonary/Chest: Effort normal and breath sounds normal. No respiratory distress. He has no wheezes. He has no rales.  Musculoskeletal: Normal range of motion. He exhibits no edema.  Lymphadenopathy:    He has no cervical adenopathy.  Neurological: He is alert and oriented to person, place, and time. Coordination normal.  Skin: Skin is warm and dry. No rash noted. He is not diaphoretic.  Psychiatric: He has a normal mood and affect. His behavior is normal.  Nursing note and vitals reviewed.       Assessment & Plan:   Problem List Items Addressed This Visit    None    Visit Diagnoses    Flu-like symptoms    -  Primary   Relevant Medications   oseltamivir (TAMIFLU) 75 MG capsule   Other Relevant Orders   Veritor Flu A/B Waived      Influenza came back negative but because of symptoms we will treat like it is the flu and given Tamiflu.  Return if worsens Follow up plan: Return if symptoms worsen or fail to improve.  Counseling provided for all of the vaccine components Orders Placed This Encounter  Procedures  . Veritor Flu A/B Kent Narrows, MD [facility] 11/19/2017, 5:25 PM

## 2017-11-21 ENCOUNTER — Telehealth: Payer: Self-pay | Admitting: Family Medicine

## 2017-11-21 NOTE — Telephone Encounter (Signed)
Instructed pt to alternated tylenol & motrin to keep his fever down, he is taking mucinex, cough med, just not feeling any better, offered first available appt in morning. He will see how he feels and call in the morning

## 2017-11-26 ENCOUNTER — Encounter: Payer: Self-pay | Admitting: Family Medicine

## 2017-11-26 ENCOUNTER — Ambulatory Visit (INDEPENDENT_AMBULATORY_CARE_PROVIDER_SITE_OTHER): Payer: BLUE CROSS/BLUE SHIELD

## 2017-11-26 ENCOUNTER — Ambulatory Visit (INDEPENDENT_AMBULATORY_CARE_PROVIDER_SITE_OTHER): Payer: BLUE CROSS/BLUE SHIELD | Admitting: Family Medicine

## 2017-11-26 VITALS — BP 112/70 | HR 82 | Temp 98.0°F | Ht 70.0 in | Wt 164.6 lb

## 2017-11-26 DIAGNOSIS — J181 Lobar pneumonia, unspecified organism: Secondary | ICD-10-CM | POA: Diagnosis not present

## 2017-11-26 DIAGNOSIS — J189 Pneumonia, unspecified organism: Secondary | ICD-10-CM

## 2017-11-26 DIAGNOSIS — J9811 Atelectasis: Secondary | ICD-10-CM | POA: Diagnosis not present

## 2017-11-26 MED ORDER — LEVOFLOXACIN 750 MG PO TABS: 750.0000 mg | ORAL_TABLET | Freq: Every day | ORAL | 0 refills | Status: DC

## 2017-11-26 MED ORDER — HYDROCODONE-HOMATROPINE 5-1.5 MG/5ML PO SYRP: 5.0000 mL | ORAL_SOLUTION | Freq: Four times a day (QID) | ORAL | 0 refills | Status: DC | PRN

## 2017-11-26 NOTE — Patient Instructions (Signed)
Great to see you!   Community-Acquired Pneumonia, Adult Pneumonia is an infection of the lungs. There are different types of pneumonia. One type can develop while a person is in a hospital. A different type, called community-acquired pneumonia, develops in people who are not, or have not recently been, in the hospital or other health care facility. What are the causes? Pneumonia may be caused by bacteria, viruses, or funguses. Community-acquired pneumonia is often caused by Streptococcus pneumonia bacteria. These bacteria are often passed from one person to another by breathing in droplets from the cough or sneeze of an infected person. What increases the risk? The condition is more likely to develop in:  People who havechronic diseases, such as chronic obstructive pulmonary disease (COPD), asthma, congestive heart failure, cystic fibrosis, diabetes, or kidney disease.  People who haveearly-stage or late-stage HIV.  People who havesickle cell disease.  People who havehad their spleen removed (splenectomy).  People who havepoor dental hygiene.  People who havemedical conditions that increase the risk of breathing in (aspirating) secretions their own mouth and nose.  People who havea weakened immune system (immunocompromised).  People who smoke.  People whotravel to areas where pneumonia-causing germs commonly exist.  People whoare around animal habitats or animals that have pneumonia-causing germs, including birds, bats, rabbits, cats, and farm animals.  What are the signs or symptoms? Symptoms of this condition include:  Adry cough.  A wet (productive) cough.  Fever.  Sweating.  Chest pain, especially when breathing deeply or coughing.  Rapid breathing or difficulty breathing.  Shortness of breath.  Shaking chills.  Fatigue.  Muscle aches.  How is this diagnosed? Your health care provider will take a medical history and perform a physical exam. You may  also have other tests, including:  Imaging studies of your chest, including X-rays.  Tests to check your blood oxygen level and other blood gases.  Other tests on blood, mucus (sputum), fluid around your lungs (pleural fluid), and urine.  If your pneumonia is severe, other tests may be done to identify the specific cause of your illness. How is this treated? The type of treatment that you receive depends on many factors, such as the cause of your pneumonia, the medicines you take, and other medical conditions that you have. For most adults, treatment and recovery from pneumonia may occur at home. In some cases, treatment must happen in a hospital. Treatment may include:  Antibiotic medicines, if the pneumonia was caused by bacteria.  Antiviral medicines, if the pneumonia was caused by a virus.  Medicines that are given by mouth or through an IV tube.  Oxygen.  Respiratory therapy.  Although rare, treating severe pneumonia may include:  Mechanical ventilation. This is done if you are not breathing well on your own and you cannot maintain a safe blood oxygen level.  Thoracentesis. This procedureremoves fluid around one lung or both lungs to help you breathe better.  Follow these instructions at home:  Take over-the-counter and prescription medicines only as told by your health care provider. ? Only takecough medicine if you are losing sleep. Understand that cough medicine can prevent your body's natural ability to remove mucus from your lungs. ? If you were prescribed an antibiotic medicine, take it as told by your health care provider. Do not stop taking the antibiotic even if you start to feel better.  Sleep in a semi-upright position at night. Try sleeping in a reclining chair, or place a few pillows under your head.  Do not   use tobacco products, including cigarettes, chewing tobacco, and e-cigarettes. If you need help quitting, ask your health care provider.  Drink enough  water to keep your urine clear or pale yellow. This will help to thin out mucus secretions in your lungs. How is this prevented? There are ways that you can decrease your risk of developing community-acquired pneumonia. Consider getting a pneumococcal vaccine if:  You are older than 61 years of age.  You are older than 61 years of age and are undergoing cancer treatment, have chronic lung disease, or have other medical conditions that affect your immune system. Ask your health care provider if this applies to you.  There are different types and schedules of pneumococcal vaccines. Ask your health care provider which vaccination option is best for you. You may also prevent community-acquired pneumonia if you take these actions:  Get an influenza vaccine every year. Ask your health care provider which type of influenza vaccine is best for you.  Go to the dentist on a regular basis.  Wash your hands often. Use hand sanitizer if soap and water are not available.  Contact a health care provider if:  You have a fever.  You are losing sleep because you cannot control your cough with cough medicine. Get help right away if:  You have worsening shortness of breath.  You have increased chest pain.  Your sickness becomes worse, especially if you are an older adult or have a weakened immune system.  You cough up blood. This information is not intended to replace advice given to you by your health care provider. Make sure you discuss any questions you have with your health care provider. Document Released: 09/03/2005 Document Revised: 01/12/2016 Document Reviewed: 12/29/2014 Elsevier Interactive Patient Education  2018 [employer]  

## 2017-11-26 NOTE — Progress Notes (Signed)
   HPI  Patient presents today with continued illness.  Patient's had approximately 12 days of illness, he had a fever again today of around 100.0. Patient was seen on March 5, 7 days ago and diagnosed with influenza. He was started on Tamiflu and is tolerating that well.  Patient complains of cough, congestion, facial pressure, headache, and continued fevers. He is tolerating fluids like usual. He does have shortness of breath.   PMH: Smoking status noted ROS: Per HPI  Objective: BP 112/70   Pulse 82   Temp 98 F (36.7 C) (Oral)   Ht 5\' 10"  (1.778 m)   Wt 164 lb 9.6 oz (74.7 kg)   SpO2 96%   BMI 23.62 kg/m   Gen: NAD, alert, cooperative with exam HEENT: NCAT, nares moist and clear, TMs normal bilaterally with mild erythema on the left, no tenderness to palpation of sinuses bilaterally CV: RRR, good S1/S2, no murmur Resp: Nonlabored, good air movement, coarse breath sounds in bilateral upper lung fields and left lower lung field Ext: No edema, warm Neuro: Alert and oriented, No gross deficits  Assessment and plan:  #Community-acquired pneumonia Likely left lower lobe pneumonia given my exam, given Levaquin Likely post influenza pneumonia Discussed red flags and reasons to return Patient does have history of proptosis, consider prednisone burst if needed Plain film pending, however even if no lobar infiltrate is seen I would still proceed with Levaquin as prescribed.   Orders Placed This Encounter  Procedures  . DG Chest 2 View    Standing Status:   Future    Number of Occurrences:   1    Standing Expiration Date:   01/27/2019    Order Specific Question:   Reason for Exam (SYMPTOM  OR DIAGNOSIS REQUIRED)    Answer:   cough, eval for CAP    Order Specific Question:   Preferred imaging location?    Answer:   Internal    Order Specific Question:   Radiology Contrast Protocol - do NOT remove file path    Answer:    [email or web address]    Meds ordered this encounter  Medications  . levofloxacin (LEVAQUIN) 750 MG tablet    Sig: Take 1 tablet (750 mg total) by mouth daily.    Dispense:  10 tablet    Refill:  0  . HYDROcodone-homatropine (HYCODAN) 5-1.5 MG/5ML syrup    Sig: Take 5 mLs by mouth every 6 (six) hours as needed for cough.    Dispense:  120 mL    Refill:  0    Laroy Apple, MD [facility] 11/26/2017, 2:46 PM

## 2017-11-27 LAB — VERITOR FLU A/B WAIVED
INFLUENZA B: NEGATIVE
Influenza A: NEGATIVE

## 2017-12-11 ENCOUNTER — Other Ambulatory Visit: Payer: Self-pay | Admitting: Nurse Practitioner

## 2017-12-12 ENCOUNTER — Ambulatory Visit (INDEPENDENT_AMBULATORY_CARE_PROVIDER_SITE_OTHER): Payer: BLUE CROSS/BLUE SHIELD | Admitting: Family Medicine

## 2017-12-12 ENCOUNTER — Encounter: Payer: Self-pay | Admitting: Family Medicine

## 2017-12-12 VITALS — BP 115/67 | HR 62 | Temp 96.6°F | Ht 70.0 in | Wt 166.0 lb

## 2017-12-12 DIAGNOSIS — Z23 Encounter for immunization: Secondary | ICD-10-CM

## 2017-12-12 DIAGNOSIS — R0982 Postnasal drip: Secondary | ICD-10-CM | POA: Diagnosis not present

## 2017-12-12 DIAGNOSIS — E785 Hyperlipidemia, unspecified: Secondary | ICD-10-CM | POA: Diagnosis not present

## 2017-12-12 DIAGNOSIS — K219 Gastro-esophageal reflux disease without esophagitis: Secondary | ICD-10-CM

## 2017-12-12 MED ORDER — FLUTICASONE PROPIONATE 50 MCG/ACT NA SUSP: 2.0000 | Freq: Every day | NASAL | 6 refills | Status: DC

## 2017-12-12 NOTE — Patient Instructions (Signed)
Great to see you!   

## 2017-12-12 NOTE — Progress Notes (Signed)
   HPI  Patient presents today here for routine follow-up.  Patient has recently been treated for cough, he states that he is much better, however still has frequent throat clearing.  Also some sinus pressure and lightheadedness associated with that. He is taking Zyrtec.  Patient states that he previously had hyperlipidemia, however he made multiple lifestyle changes and had good improvement. He is fasting today, he would like repeat check of labs.  We discussed tetanus vaccine and he would like to have a booster  PMH: Smoking status noted ROS: Per HPI  Objective: BP 115/67   Pulse 62   Temp (!) 96.6 F (35.9 C) (Oral)   Ht '5\' 10"'$  (1.778 m)   Wt 166 lb (75.3 kg)   BMI 23.82 kg/m  Gen: NAD, alert, cooperative with exam HEENT: NCAT CV: RRR, good S1/S2, no murmur Resp: CTABL, no wheezes, non-labored Ext: No edema, warm Neuro: Alert and oriented, No gross deficits  Assessment and plan:  #Hyperlipidemia Diet controlled previously, labs today fasting   #Postnasal drip Symptoms most consistent with postnasal drip Continue Zyrtec Flonase added  #Need for tetanus booster-counseling provided for all vaccines and vaccine components.  GERD Symptoms well controlled with omeprazole, no changes    Orders Placed This Encounter  Procedures  . Tdap vaccine greater than or equal to 7yo IM  . Lipid panel  . CMP14+EGFR  . CBC with Differential/Platelet    Meds ordered this encounter  Medications  . fluticasone (FLONASE) 50 MCG/ACT nasal spray    Sig: Place 2 sprays into both nostrils daily.    Dispense:  16 g    Refill:  McCoy, MD [facility] 12/12/2017, 11:45 AM

## 2017-12-13 LAB — LIPID PANEL
CHOL/HDL RATIO: 3.7 ratio (ref 0.0–5.0)
Cholesterol, Total: 159 mg/dL (ref 100–199)
HDL: 43 mg/dL (ref >39)
LDL Calculated: 101 mg/dL — ABNORMAL HIGH (ref 0–99)
Triglycerides: 75 mg/dL (ref 0–149)
VLDL Cholesterol Cal: 15 mg/dL (ref 5–40)

## 2017-12-13 LAB — CMP14+EGFR
A/G RATIO: 1.4 (ref 1.2–2.2)
ALT: 14 IU/L (ref 0–44)
AST: 15 IU/L (ref 0–40)
Albumin: 3.9 g/dL (ref 3.6–4.8)
Alkaline Phosphatase: 69 IU/L (ref 39–117)
BUN/Creatinine Ratio: 12 (ref 10–24)
BUN: 12 mg/dL (ref 8–27)
Bilirubin Total: 0.6 mg/dL (ref 0.0–1.2)
CALCIUM: 9.8 mg/dL (ref 8.6–10.2)
CO2: 26 mmol/L (ref 20–29)
CREATININE: 1.02 mg/dL (ref 0.76–1.27)
Chloride: 105 mmol/L (ref 96–106)
GFR, EST AFRICAN AMERICAN: 92 mL/min/{1.73_m2} (ref >59)
GFR, EST NON AFRICAN AMERICAN: 80 mL/min/{1.73_m2} (ref >59)
Globulin, Total: 2.8 g/dL (ref 1.5–4.5)
Glucose: 83 mg/dL (ref 65–99)
POTASSIUM: 5.2 mmol/L (ref 3.5–5.2)
Sodium: 142 mmol/L (ref 134–144)
TOTAL PROTEIN: 6.7 g/dL (ref 6.0–8.5)

## 2017-12-13 LAB — CBC WITH DIFFERENTIAL/PLATELET
BASOS: 1 %
Basophils Absolute: 0 10*3/uL (ref 0.0–0.2)
EOS (ABSOLUTE): 0.1 10*3/uL (ref 0.0–0.4)
EOS: 2 %
HEMATOCRIT: 37.6 % (ref 37.5–51.0)
HEMOGLOBIN: 12.9 g/dL — AB (ref 13.0–17.7)
IMMATURE GRANS (ABS): 0 10*3/uL (ref 0.0–0.1)
Immature Granulocytes: 1 %
LYMPHS: 25 %
Lymphocytes Absolute: 1.4 10*3/uL (ref 0.7–3.1)
MCH: 29.4 pg (ref 26.6–33.0)
MCHC: 34.3 g/dL (ref 31.5–35.7)
MCV: 86 fL (ref 79–97)
Monocytes Absolute: 0.7 10*3/uL (ref 0.1–0.9)
Monocytes: 12 %
NEUTROS ABS: 3.5 10*3/uL (ref 1.4–7.0)
NEUTROS PCT: 59 %
Platelets: 166 10*3/uL (ref 150–379)
RBC: 4.39 x10E6/uL (ref 4.14–5.80)
RDW: 13.6 % (ref 12.3–15.4)
WBC: 5.8 10*3/uL (ref 3.4–10.8)

## 2017-12-17 ENCOUNTER — Other Ambulatory Visit: Payer: Self-pay | Admitting: Neurology

## 2017-12-17 DIAGNOSIS — R209 Unspecified disturbances of skin sensation: Principal | ICD-10-CM

## 2017-12-17 DIAGNOSIS — IMO0001 Reserved for inherently not codable concepts without codable children: Secondary | ICD-10-CM

## 2017-12-18 ENCOUNTER — Other Ambulatory Visit: Payer: Self-pay

## 2017-12-18 DIAGNOSIS — IMO0001 Reserved for inherently not codable concepts without codable children: Secondary | ICD-10-CM

## 2017-12-18 DIAGNOSIS — R209 Unspecified disturbances of skin sensation: Principal | ICD-10-CM

## 2017-12-18 MED ORDER — TOPIRAMATE 25 MG PO TABS: 25.0000 mg | ORAL_TABLET | Freq: Two times a day (BID) | ORAL | 1 refills | Status: DC

## 2017-12-19 ENCOUNTER — Other Ambulatory Visit: Payer: Self-pay

## 2017-12-19 ENCOUNTER — Telehealth: Payer: Self-pay | Admitting: Family Medicine

## 2017-12-19 DIAGNOSIS — E785 Hyperlipidemia, unspecified: Secondary | ICD-10-CM

## 2017-12-19 NOTE — Progress Notes (Signed)
Future orders placed for apt.

## 2017-12-24 DIAGNOSIS — B351 Tinea unguium: Secondary | ICD-10-CM | POA: Diagnosis not present

## 2017-12-24 DIAGNOSIS — G609 Hereditary and idiopathic neuropathy, unspecified: Secondary | ICD-10-CM | POA: Diagnosis not present

## 2018-01-01 ENCOUNTER — Other Ambulatory Visit: Payer: Self-pay | Admitting: Neurology

## 2018-01-24 DIAGNOSIS — C61 Malignant neoplasm of prostate: Secondary | ICD-10-CM | POA: Diagnosis not present

## 2018-02-03 ENCOUNTER — Ambulatory Visit (INDEPENDENT_AMBULATORY_CARE_PROVIDER_SITE_OTHER): Payer: BLUE CROSS/BLUE SHIELD | Admitting: Adult Health

## 2018-02-03 ENCOUNTER — Ambulatory Visit: Payer: BLUE CROSS/BLUE SHIELD | Admitting: Adult Health

## 2018-02-03 ENCOUNTER — Encounter: Payer: Self-pay | Admitting: Adult Health

## 2018-02-03 VITALS — BP 128/75 | HR 59 | Ht 70.0 in | Wt 170.0 lb

## 2018-02-03 DIAGNOSIS — R202 Paresthesia of skin: Secondary | ICD-10-CM | POA: Diagnosis not present

## 2018-02-03 NOTE — Progress Notes (Signed)
PATIENT: Philip Hernandez DOB: 1956/05/07  REASON FOR VISIT: follow up HISTORY FROM: patient  HISTORY OF PRESENT ILLNESS: Today 02/03/18 Philip Hernandez is a 62 year old male with a history of paresthesias in the feet.  He returns today for follow-up.  He is currently on gabapentin 800 mg 4 times a day.  He reports that this work well most the time.  He states recently he has noticed  more pain in the legs.  He states that he does not take Topamax consistently.  Reports that he went to [place] recently and he did take Topamax daily and he felt that his pain was under better control.  Denies any significant changes with his gait or balance.  Denies any falls.  He returns today for evaluation.  HISTORY 08/05/17  Philip Hernandez is a 62 year old male with a history of paresthesias in the feet.  He returns today for follow-up.  He remains on gabapentin 800 mg 4 times a day.  He states that he has been taking Topamax only for breakthrough pain.  He states that he has burning and tingling pain primarily in the feet.  He reports that he does have some numbness that extends up the leg.  He reports when he is active he tends to not notice the discomfort.  He states when he sits down the rest that is when his pain is more severe.  The patient reports that he completed treatment for prostate cancer.  However his PSA results still indicates that the cancer is present.  But he reports that he has another PSA in January and pending that report will determine any further treatment.  He returns today for follow-up.   REVIEW OF SYSTEMS: Out of a complete 14 system review of symptoms, the patient complains only of the following symptoms, and all other reviewed systems are negative.  Eye itching, light sensitivity, blurred vision, shortness of breath, chest tightness, hearing loss, runny nose, daytime sleepiness, snoring, muscle cramps, testicular pain, incontinence of bladder, headache, numbness,  weakness  ALLERGIES: Allergies  Allergen Reactions  . Corn-Containing Products     HOME MEDICATIONS: Outpatient Medications Prior to Visit  Medication Sig Dispense Refill  . acetaminophen (TYLENOL) 500 MG tablet Take 500 mg by mouth every 6 (six) hours as needed.    . benzonatate (TESSALON PERLES) 100 MG capsule Take 1 capsule (100 mg total) by mouth 3 (three) times daily as needed for cough. 20 capsule 0  . cetirizine (ZYRTEC) 10 MG tablet Take 10 mg by mouth daily.    . cholecalciferol (VITAMIN D) 1000 UNITS tablet Take 1,000 Units by mouth daily.      . fluticasone (FLONASE) 50 MCG/ACT nasal spray Place 2 sprays into both nostrils daily. 16 g 6  . gabapentin (NEURONTIN) 800 MG tablet TAKE 1 TABLET BY MOUTH 4  TIMES DAILY 360 tablet 3  . Multiple Vitamins-Minerals (AIRBORNE PO) Take by mouth daily.    Marland Kitchen omeprazole (PRILOSEC) 40 MG capsule TAKE ONE (1) CAPSULE EACH DAY 30 capsule 5  . topiramate (TOPAMAX) 25 MG tablet Take 1 tablet (25 mg total) by mouth 2 (two) times daily. 180 tablet 1   No facility-administered medications prior to visit.     PAST MEDICAL HISTORY: Past Medical History:  Diagnosis Date  . Colon polyp   . Colon polyps 2015  . Cough    chronic sinus issues, GERD with poor dietary control and Pulmonary Sarcoid.   Marland Kitchen GERD (gastroesophageal reflux disease) Jan 2012   Dr  Deatra Ina. Bx proven.  Deve abd pain in Jan 2012 following high dose steroids for sarcoid. s/p EGD and improvement in symptoms following PPI  . Hypogonadism male   . Kidney stone   . Malignant neoplasm of prostate [facility]) dec 2010   [name]. s/p surgery dec 2010, hx of rnormal PSA  since then  . Other and unspecified mycoses   . Painful respiration   . Peripheral neuropathy   . Prostate cancer ([place])    reoccurence (getting radiation 01-14-17 start).  . Sarcoidosis dec 2011   stage 2 pulmonary, neck nodes, splenic granuloma  . Skin cancer   . Swelling, mass, or lump in head and neck      PAST SURGICAL HISTORY: Past Surgical History:  Procedure Laterality Date  . HERNIA REPAIR  13 yrs ago  . LYMPH NODE BIOPSY  05/2010, 07/2010  . nasal polyps  1984  . NASAL SINUS SURGERY  2013  . PROSTATE SURGERY  2010   radiation 2018  . SHOULDER SURGERY Right 2009, 2010   x 2  . SKIN CANCER EXCISION  11/16/14    FAMILY HISTORY: Family History  Problem Relation Age of Onset  . Colon cancer Mother 78  . Skin cancer Mother   . Cancer Mother        colon  . Colon cancer Brother 60  . Skin cancer Brother   . Cancer Brother        colon  . Colon cancer Maternal Aunt        dx in her 78s  . Cancer Maternal Aunt        "cancer of male organs" dx in her 1s  . Breast cancer Maternal Aunt 66       bilateral, dx again at 63  . Colon cancer Maternal Aunt 65  . Stomach cancer Maternal Aunt 68  . Dementia Father   . Stomach cancer Maternal Uncle 58  . Skin cancer Paternal Aunt   . Prostate cancer Paternal Uncle 51  . Cancer Paternal Uncle        prostate  . Stomach cancer Paternal Grandmother        dx in her 31s  . Stroke Paternal Grandfather   . Colon cancer Maternal Aunt 68  . Prostate cancer Paternal Uncle   . Cancer Paternal Uncle        prostate  . Prostate cancer Paternal Uncle   . Kidney cancer Paternal Uncle   . Lung cancer Paternal Uncle   . Cancer Paternal Uncle        prostate  . Lung cancer Paternal Uncle   . Cancer Paternal Uncle        unknown  . Cancer Paternal Uncle        prostate  . Cancer Cousin        paternal cousin with cancer NOS  . Cancer Cousin        paternal cousin with cancer NOS    SOCIAL HISTORY: Social History   Socioeconomic History  . Marital status: Married    Spouse name: Not on file  . Number of children: 2  . Years of education: Not on file  . Highest education level: Not on file  Occupational History  . Occupation: unemployed  Social Needs  . Financial resource strain: Not on file  . Food insecurity:    Worry:  Not on file    Inability: Not on file  . Transportation needs:    Medical: Not  on file    Non-medical: Not on file  Tobacco Use  . Smoking status: Never Smoker  . Smokeless tobacco: Never Used  Substance and Sexual Activity  . Alcohol use: No  . Drug use: No  . Sexual activity: Yes  Lifestyle  . Physical activity:    Days per week: Not on file    Minutes per session: Not on file  . Stress: Not on file  Relationships  . Social connections:    Talks on phone: Not on file    Gets together: Not on file    Attends religious service: Not on file    Active member of club or organization: Not on file    Attends meetings of clubs or organizations: Not on file    Relationship status: Not on file  . Intimate partner violence:    Fear of current or ex partner: Not on file    Emotionally abused: Not on file    Physically abused: Not on file    Forced sexual activity: Not on file  Other Topics Concern  . Not on file  Social History Narrative   Married   2 children      PHYSICAL EXAM  Vitals:   02/03/18 1025  BP: 128/75  Pulse: (!) 59  Weight: 170 lb (77.1 kg)  Height: 5\' 10"  (1.778 m)   Body mass index is 24.39 kg/m.  Generalized: Well developed, in no acute distress   Neurological examination  Mentation: Alert oriented to time, place, history taking. Follows all commands speech and language fluent Cranial nerve II-XII: Pupils were equal round reactive to light. Extraocular movements were full, visual field were full on confrontational test. Facial sensation and strength were normal. Uvula tongue midline. Head turning and shoulder shrug  were normal and symmetric. Motor: The motor testing reveals 5 over 5 strength of all 4 extremities. Good symmetric motor tone is noted throughout.  Sensory: Sensory testing is intact to soft touch on all 4 extremities. No evidence of extinction is noted.  Coordination: Cerebellar testing reveals good finger-nose-finger and heel-to-shin  bilaterally.  Gait and station: Gait is normal. Tandem gait is n slightly unsteady.  Romberg is negative. No drift is seen.  Reflexes: Deep tendon reflexes are symmetric and normal bilaterally.   DIAGNOSTIC DATA (LABS, IMAGING, TESTING) - I reviewed patient records, labs, notes, testing and imaging myself where available.  Lab Results  Component Value Date   WBC 5.8 12/12/2017   HGB 12.9 (L) 12/12/2017   HCT 37.6 12/12/2017   MCV 86 12/12/2017   PLT 166 12/12/2017      Component Value Date/Time   NA 142 12/12/2017 1116   K 5.2 12/12/2017 1116   CL 105 12/12/2017 1116   CO2 26 12/12/2017 1116   GLUCOSE 83 12/12/2017 1116   GLUCOSE 80 03/26/2013 1220   BUN 12 12/12/2017 1116   CREATININE 1.02 12/12/2017 1116   CREATININE 1.15 03/26/2013 1220   CALCIUM 9.8 12/12/2017 1116   PROT 6.7 12/12/2017 1116   ALBUMIN 3.9 12/12/2017 1116   AST 15 12/12/2017 1116   ALT 14 12/12/2017 1116   ALKPHOS 69 12/12/2017 1116   BILITOT 0.6 12/12/2017 1116   GFRNONAA 80 12/12/2017 1116   GFRNONAA 71 03/26/2013 1220   GFRAA 92 12/12/2017 1116   GFRAA 82 03/26/2013 1220   Lab Results  Component Value Date   CHOL 159 12/12/2017   HDL 43 12/12/2017   LDLCALC 101 (H) 12/12/2017   TRIG 75 12/12/2017  CHOLHDL 3.7 12/12/2017    Lab Results  Component Value Date   TSH 2.870 04/10/2017      ASSESSMENT AND PLAN 62 y.o. year old male  has a past medical history of Colon polyp, Colon polyps (2015), Cough, GERD (gastroesophageal reflux disease) (Jan 2012), Hypogonadism male, Kidney stone, Malignant neoplasm of prostate ([place]) (dec 2010), Other and unspecified mycoses, Painful respiration, Peripheral neuropathy, Prostate cancer ([place]), Sarcoidosis (dec 2011), Skin cancer, and Swelling, mass, or lump in head and neck. here with:  1.  Paresthesia of lower extremities  Patient reports increased discomfort in the lower extremities.  I have advised that he should take Topamax 25 mg twice a day  consistently.  He should continue on gabapentin 800 mg 4 times a day.  He is advised that if his symptoms worsen or he develops new symptoms he should let us know.  He will follow-up in 6 months or sooner if needed.     [name], MSN, NP-C 02/03/2018, 10:35 AM [facility] [address], [place], [place] [address] [phone]

## 2018-02-03 NOTE — Progress Notes (Signed)
I have read the note, and I agree with the clinical assessment and plan.  Griselda Bramblett K Joren Rehm   

## 2018-02-03 NOTE — Patient Instructions (Signed)
Your Plan:  Continue gabapentin Take Topamax 25 mg twice a day If your symptoms worsen or you develop new symptoms please let us know.    Thank you for coming to see [place] at [facility]. I hope we have been able to provide you high quality care today.  You may receive a patient satisfaction survey over the next few weeks. We would appreciate your feedback and comments so that we may continue to improve ourselves and the health of our patients.

## 2018-03-07 ENCOUNTER — Other Ambulatory Visit: Payer: Self-pay | Admitting: Family Medicine

## 2018-03-07 ENCOUNTER — Ambulatory Visit (HOSPITAL_COMMUNITY)
Admission: RE | Admit: 2018-03-07 | Discharge: 2018-03-07 | Disposition: A | Discharge status: Home / Self Care | Payer: BLUE CROSS/BLUE SHIELD | Source: Ambulatory Visit | Attending: Family Medicine | Admitting: Family Medicine

## 2018-03-07 ENCOUNTER — Ambulatory Visit (HOSPITAL_COMMUNITY): Admission: RE | Admit: 2018-03-07 | Payer: BLUE CROSS/BLUE SHIELD | Source: Ambulatory Visit

## 2018-03-07 ENCOUNTER — Ambulatory Visit (INDEPENDENT_AMBULATORY_CARE_PROVIDER_SITE_OTHER): Payer: BLUE CROSS/BLUE SHIELD | Admitting: Family Medicine

## 2018-03-07 ENCOUNTER — Encounter: Payer: Self-pay | Admitting: Family Medicine

## 2018-03-07 VITALS — BP 132/81 | HR 62 | Temp 97.0°F | Ht 70.0 in | Wt 169.0 lb

## 2018-03-07 DIAGNOSIS — R51 Headache: Secondary | ICD-10-CM | POA: Insufficient documentation

## 2018-03-07 DIAGNOSIS — G44319 Acute post-traumatic headache, not intractable: Secondary | ICD-10-CM | POA: Diagnosis not present

## 2018-03-07 DIAGNOSIS — R52 Pain, unspecified: Secondary | ICD-10-CM

## 2018-03-07 DIAGNOSIS — S0990XA Unspecified injury of head, initial encounter: Secondary | ICD-10-CM

## 2018-03-07 NOTE — Patient Instructions (Signed)
Great to see you!   
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
## 2018-03-10 ENCOUNTER — Other Ambulatory Visit: Payer: Self-pay | Admitting: Family Medicine

## 2018-03-10 MED ORDER — CEFDINIR 300 MG PO CAPS: 300.0000 mg | ORAL_CAPSULE | Freq: Two times a day (BID) | ORAL | 0 refills | Status: DC

## 2018-03-18 DIAGNOSIS — M79676 Pain in unspecified toe(s): Secondary | ICD-10-CM | POA: Diagnosis not present

## 2018-03-18 DIAGNOSIS — B351 Tinea unguium: Secondary | ICD-10-CM | POA: Diagnosis not present

## 2018-03-25 DIAGNOSIS — Z85828 Personal history of other malignant neoplasm of skin: Secondary | ICD-10-CM | POA: Diagnosis not present

## 2018-03-25 DIAGNOSIS — L570 Actinic keratosis: Secondary | ICD-10-CM | POA: Diagnosis not present

## 2018-03-25 DIAGNOSIS — L812 Freckles: Secondary | ICD-10-CM | POA: Diagnosis not present

## 2018-03-25 DIAGNOSIS — L821 Other seborrheic keratosis: Secondary | ICD-10-CM | POA: Diagnosis not present

## 2018-03-25 DIAGNOSIS — I8310 Varicose veins of unspecified lower extremity with inflammation: Secondary | ICD-10-CM | POA: Diagnosis not present

## 2018-03-25 DIAGNOSIS — L819 Disorder of pigmentation, unspecified: Secondary | ICD-10-CM | POA: Diagnosis not present

## 2018-03-25 DIAGNOSIS — D1801 Hemangioma of skin and subcutaneous tissue: Secondary | ICD-10-CM | POA: Diagnosis not present

## 2018-03-25 DIAGNOSIS — L814 Other melanin hyperpigmentation: Secondary | ICD-10-CM | POA: Diagnosis not present

## 2018-03-31 ENCOUNTER — Ambulatory Visit: Payer: BLUE CROSS/BLUE SHIELD | Admitting: Family Medicine

## 2018-04-01 ENCOUNTER — Encounter: Payer: Self-pay | Admitting: Family Medicine

## 2018-04-01 ENCOUNTER — Ambulatory Visit (INDEPENDENT_AMBULATORY_CARE_PROVIDER_SITE_OTHER): Payer: BLUE CROSS/BLUE SHIELD

## 2018-04-01 ENCOUNTER — Ambulatory Visit (INDEPENDENT_AMBULATORY_CARE_PROVIDER_SITE_OTHER): Payer: BLUE CROSS/BLUE SHIELD | Admitting: Family Medicine

## 2018-04-01 VITALS — BP 125/67 | HR 75 | Temp 97.2°F | Ht 70.0 in | Wt 172.4 lb

## 2018-04-01 DIAGNOSIS — M545 Low back pain, unspecified: Secondary | ICD-10-CM

## 2018-04-01 DIAGNOSIS — M25512 Pain in left shoulder: Secondary | ICD-10-CM

## 2018-04-01 NOTE — Patient Instructions (Addendum)
Great to see you!  Come back to see Dr. Warrick Parisian in 3-4 months
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
## 2018-04-11 ENCOUNTER — Encounter: Payer: Self-pay | Admitting: *Deleted

## 2018-04-14 ENCOUNTER — Ambulatory Visit: Payer: BLUE CROSS/BLUE SHIELD | Admitting: Family Medicine

## 2018-04-25 DIAGNOSIS — C61 Malignant neoplasm of prostate: Secondary | ICD-10-CM | POA: Diagnosis not present

## 2018-04-29 DIAGNOSIS — L03031 Cellulitis of right toe: Secondary | ICD-10-CM | POA: Diagnosis not present

## 2018-04-29 DIAGNOSIS — M79675 Pain in left toe(s): Secondary | ICD-10-CM | POA: Diagnosis not present

## 2018-05-20 DIAGNOSIS — B351 Tinea unguium: Secondary | ICD-10-CM | POA: Diagnosis not present

## 2018-06-19 DIAGNOSIS — B351 Tinea unguium: Secondary | ICD-10-CM | POA: Diagnosis not present

## 2018-07-03 ENCOUNTER — Encounter: Payer: Self-pay | Admitting: Family Medicine

## 2018-07-03 ENCOUNTER — Ambulatory Visit (INDEPENDENT_AMBULATORY_CARE_PROVIDER_SITE_OTHER): Payer: BLUE CROSS/BLUE SHIELD | Admitting: Family Medicine

## 2018-07-03 ENCOUNTER — Other Ambulatory Visit: Payer: Self-pay | Admitting: *Deleted

## 2018-07-03 VITALS — BP 130/81 | HR 64 | Temp 97.3°F | Ht 70.0 in | Wt 178.2 lb

## 2018-07-03 DIAGNOSIS — E782 Mixed hyperlipidemia: Secondary | ICD-10-CM

## 2018-07-03 DIAGNOSIS — Z23 Encounter for immunization: Secondary | ICD-10-CM | POA: Diagnosis not present

## 2018-07-03 DIAGNOSIS — R609 Edema, unspecified: Secondary | ICD-10-CM | POA: Diagnosis not present

## 2018-07-03 DIAGNOSIS — W57XXXA Bitten or stung by nonvenomous insect and other nonvenomous arthropods, initial encounter: Secondary | ICD-10-CM | POA: Diagnosis not present

## 2018-07-03 DIAGNOSIS — K219 Gastro-esophageal reflux disease without esophagitis: Secondary | ICD-10-CM | POA: Diagnosis not present

## 2018-07-03 DIAGNOSIS — E785 Hyperlipidemia, unspecified: Secondary | ICD-10-CM | POA: Diagnosis not present

## 2018-07-03 MED ORDER — BENZONATATE 100 MG PO CAPS: 100.0000 mg | ORAL_CAPSULE | Freq: Three times a day (TID) | ORAL | 2 refills | Status: DC | PRN

## 2018-07-03 NOTE — Progress Notes (Signed)
BP 130/81   Pulse 64   Temp (!) 97.3 F (36.3 C) (Oral)   Ht 5\' 10"  (1.778 m)   Wt 178 lb 3.2 oz (80.8 kg)   BMI 25.57 kg/m    Subjective:    Patient ID: Philip Hernandez, male    DOB: Apr 08, 1956, 62 y.o.   MRN: [id number]  HPI: Philip Hernandez is a 62 y.o. male presenting on 07/03/2018 for Hyperlipidemia (3 month follow up); Establish Care [name] pt); and sore near left ankle (x 4 days- bug bite?)   HPI Hyperlipidemia Patient is coming in for recheck of his hyperlipidemia. The patient is currently taking no medication and we are just monitoring for now, his last lipid panel looked great. They deny any issues with myalgias or history of liver damage from it. They deny any focal numbness or weakness or chest pain.  He does have some peripheral edema  GERD Patient is currently on omeprazole.  She denies any major symptoms or abdominal pain or belching or burping. She denies any blood in her stool or lightheadedness or dizziness.   Bug bite in leg Patient is coming in complaining of left lateral ankle that has been there about 3 days.  He noticed a bug bite and now it scabbed over and he just wanted to get checked out to make sure it is not anything else concerning.  He denies any pus or purulence drainage or warmth.  He feels like it is a little bit swelling and has a little bit of redness around the spot and it is tender at times as well.  Relevant past medical, surgical, family and social history reviewed and updated as indicated. Interim medical history since our last visit reviewed. Allergies and medications reviewed and updated.  Review of Systems  Constitutional: Negative for chills and fever.  Eyes: Negative for visual disturbance.  Respiratory: Negative for shortness of breath and wheezing.   Cardiovascular: Positive for leg swelling. Negative for chest pain.  Musculoskeletal: Negative for back pain and gait problem.  Skin: Positive for color change. Negative for rash.    Neurological: Negative for dizziness, weakness and light-headedness.  All other systems reviewed and are negative.   Per HPI unless specifically indicated above   Allergies as of 07/03/2018      Reactions   Corn-containing Products       Medication List        Accurate as of 07/03/18 11:32 AM. Always use your most recent med list.          acetaminophen 500 MG tablet Commonly known as:  TYLENOL Take 500 mg by mouth every 6 (six) hours as needed.   AIRBORNE PO Take by mouth daily.   benzonatate 100 MG capsule Commonly known as:  TESSALON Take 1 capsule (100 mg total) by mouth 3 (three) times daily as needed for cough.   cetirizine 10 MG tablet Commonly known as:  ZYRTEC Take 10 mg by mouth daily.   cholecalciferol 1000 units tablet Commonly known as:  VITAMIN D Take 1,000 Units by mouth daily.   fluticasone 50 MCG/ACT nasal spray Commonly known as:  FLONASE Place 2 sprays into both nostrils daily.   gabapentin 800 MG tablet Commonly known as:  NEURONTIN TAKE 1 TABLET BY MOUTH 4  TIMES DAILY   omeprazole 40 MG capsule Commonly known as:  PRILOSEC TAKE ONE (1) CAPSULE EACH DAY   topiramate 25 MG tablet Commonly known as:  TOPAMAX Take 1 tablet (25 mg total) by  mouth 2 (two) times daily.          Objective:    BP 130/81   Pulse 64   Temp (!) 97.3 F (36.3 C) (Oral)   Ht 5\' 10"  (1.778 m)   Wt 178 lb 3.2 oz (80.8 kg)   BMI 25.57 kg/m   Wt Readings from Last 3 Encounters:  07/03/18 178 lb 3.2 oz (80.8 kg)  04/01/18 172 lb 6.4 oz (78.2 kg)  03/07/18 169 lb (76.7 kg)    Physical Exam  Constitutional: He is oriented to person, place, and time. He appears well-developed and well-nourished. No distress.  Eyes: Conjunctivae are normal. No scleral icterus.  Neck: Neck supple. No thyromegaly present.  Cardiovascular: Normal rate, regular rhythm, normal heart sounds and intact distal pulses.  No murmur heard. Pulmonary/Chest: Effort normal and breath  sounds normal. No respiratory distress. He has no wheezes.  Musculoskeletal: Normal range of motion. He exhibits edema (1+ pitting edema).  Lymphadenopathy:    He has no cervical adenopathy.  Neurological: He is alert and oriented to person, place, and time. Coordination normal.  Skin: Skin is warm and dry. Rash noted. Rash is papular (Small papule that is excoriated, no surrounding signs of infection or purulence or warmth). He is not diaphoretic.  Psychiatric: He has a normal mood and affect. His behavior is normal.  Nursing note and vitals reviewed.       Assessment & Plan:   Problem List Items Addressed This Visit      Digestive   GERD     Other   HLD (hyperlipidemia) - Primary    Other Visit Diagnoses    Peripheral edema       Arthropod bite, initial encounter         Continue medications we will check labs today, for peripheral edema recommended compression and elevation and recommended moisturizing for the bite lesion on his left lateral leg and can use a topical antibiotic if he wants to preventatively  Follow up plan: Return in about 6 months (around 01/02/2019), or if symptoms worsen or fail to improve, for Cholesterol and GERD recheck.  Counseling provided for all of the vaccine components No orders of the defined types were placed in this encounter.   [name], MD [facility] 07/03/2018, 11:32 AM

## 2018-07-03 NOTE — Addendum Note (Signed)
Addended by: [name] on: 07/03/2018 12:36 PM   Modules accepted: Orders

## 2018-07-04 ENCOUNTER — Encounter: Payer: Self-pay | Admitting: Family Medicine

## 2018-07-04 DIAGNOSIS — D696 Thrombocytopenia, unspecified: Secondary | ICD-10-CM | POA: Insufficient documentation

## 2018-07-04 LAB — CBC WITH DIFFERENTIAL/PLATELET
Basophils Absolute: 0.1 10*3/uL (ref 0.0–0.2)
Basos: 1 %
EOS (ABSOLUTE): 0.3 10*3/uL (ref 0.0–0.4)
Eos: 4 %
HEMATOCRIT: 41.7 % (ref 37.5–51.0)
HEMOGLOBIN: 14.2 g/dL (ref 13.0–17.7)
Immature Grans (Abs): 0 10*3/uL (ref 0.0–0.1)
Immature Granulocytes: 1 %
LYMPHS ABS: 1.6 10*3/uL (ref 0.7–3.1)
Lymphs: 26 %
MCH: 29.1 pg (ref 26.6–33.0)
MCHC: 34.1 g/dL (ref 31.5–35.7)
MCV: 86 fL (ref 79–97)
Monocytes Absolute: 0.8 10*3/uL (ref 0.1–0.9)
Monocytes: 12 %
NEUTROS PCT: 56 %
Neutrophils Absolute: 3.6 10*3/uL (ref 1.4–7.0)
Platelets: 138 10*3/uL — ABNORMAL LOW (ref 150–450)
RBC: 4.88 x10E6/uL (ref 4.14–5.80)
RDW: 13.6 % (ref 12.3–15.4)
WBC: 6.3 10*3/uL (ref 3.4–10.8)

## 2018-07-04 LAB — CMP14+EGFR
ALBUMIN: 4.1 g/dL (ref 3.6–4.8)
ALK PHOS: 69 IU/L (ref 39–117)
ALT: 20 IU/L (ref 0–44)
AST: 17 IU/L (ref 0–40)
Albumin/Globulin Ratio: 1.6 (ref 1.2–2.2)
BILIRUBIN TOTAL: 0.7 mg/dL (ref 0.0–1.2)
BUN / CREAT RATIO: 15 (ref 10–24)
BUN: 16 mg/dL (ref 8–27)
CO2: 26 mmol/L (ref 20–29)
Calcium: 9.9 mg/dL (ref 8.6–10.2)
Chloride: 103 mmol/L (ref 96–106)
Creatinine, Ser: 1.08 mg/dL (ref 0.76–1.27)
GFR calc Af Amer: 85 mL/min/{1.73_m2} (ref >59)
GFR calc non Af Amer: 74 mL/min/{1.73_m2} (ref >59)
GLOBULIN, TOTAL: 2.5 g/dL (ref 1.5–4.5)
Glucose: 75 mg/dL (ref 65–99)
Potassium: 4.8 mmol/L (ref 3.5–5.2)
SODIUM: 142 mmol/L (ref 134–144)
Total Protein: 6.6 g/dL (ref 6.0–8.5)

## 2018-07-04 LAB — LIPID PANEL
CHOLESTEROL TOTAL: 182 mg/dL (ref 100–199)
Chol/HDL Ratio: 5.2 ratio — ABNORMAL HIGH (ref 0.0–5.0)
HDL: 35 mg/dL — ABNORMAL LOW (ref >39)
LDL CALC: 104 mg/dL — AB (ref 0–99)
Triglycerides: 217 mg/dL — ABNORMAL HIGH (ref 0–149)
VLDL Cholesterol Cal: 43 mg/dL — ABNORMAL HIGH (ref 5–40)

## 2018-07-25 DIAGNOSIS — C61 Malignant neoplasm of prostate: Secondary | ICD-10-CM | POA: Diagnosis not present

## 2018-08-01 DIAGNOSIS — N393 Stress incontinence (female) (male): Secondary | ICD-10-CM | POA: Diagnosis not present

## 2018-08-01 DIAGNOSIS — C61 Malignant neoplasm of prostate: Secondary | ICD-10-CM | POA: Diagnosis not present

## 2018-08-06 ENCOUNTER — Ambulatory Visit (INDEPENDENT_AMBULATORY_CARE_PROVIDER_SITE_OTHER): Payer: BLUE CROSS/BLUE SHIELD | Admitting: Pediatrics

## 2018-08-06 ENCOUNTER — Encounter: Payer: Self-pay | Admitting: Pediatrics

## 2018-08-06 VITALS — BP 124/77 | HR 69 | Temp 97.4°F | Resp 20 | Ht 70.0 in | Wt 178.0 lb

## 2018-08-06 DIAGNOSIS — J0100 Acute maxillary sinusitis, unspecified: Secondary | ICD-10-CM | POA: Diagnosis not present

## 2018-08-06 MED ORDER — AMOXICILLIN-POT CLAVULANATE 875-125 MG PO TABS: 1.0000 | ORAL_TABLET | Freq: Two times a day (BID) | ORAL | 0 refills | Status: DC

## 2018-08-06 NOTE — Progress Notes (Signed)
  Subjective:   Patient ID: Philip Hernandez, male    DOB: [birth year], 62 y.o.   MRN: [id number] CC: Cough; Chest congestion; and Facial Pain  HPI: Philip Hernandez is a 62 y.o. male   Started having head cold four days ago. Coughing, mostly dry.  Has nasal congestion, some pressure in his sinuses.  No fevers.  Appetite has been normal.  Recently told by urology he likely has recurrence of his prostate cancer.  They are not sure where yet.  He is following with them closely.  History of sarcoidosis.  He says he thinks it has been in remission.  Relevant past medical, surgical, family and social history reviewed. Allergies and medications reviewed and updated. Social History   Tobacco Use  Smoking Status Never Smoker  Smokeless Tobacco Never Used   ROS: Per HPI   Objective:    BP 124/77   Pulse 69   Temp (!) 97.4 F (36.3 C) (Oral)   Resp 20   Ht 5\' 10"  (1.778 m)   Wt 178 lb (80.7 kg)   SpO2 100%   BMI 25.54 kg/m   Wt Readings from Last 3 Encounters:  08/06/18 178 lb (80.7 kg)  07/03/18 178 lb 3.2 oz (80.8 kg)  04/01/18 172 lb 6.4 oz (78.2 kg)    Gen: NAD, alert, cooperative with exam, NCAT EYES: EOMI, no conjunctival injection, or no icterus ENT:  TMs dull gray b/l, OP without erythema, ttp over maxillary sinuses LYMPH: no cervical LAD CV: NRRR, normal S1/S2, no murmur, distal pulses 2+ b/l Resp: CTABL, no wheezes, normal WOB Ext: No edema, warm Neuro: Alert and oriented, strength equal b/l UE and LE, coordination grossly normal MSK: normal muscle bulk  Assessment & Plan:  Philip Hernandez was seen today for cough, chest congestion and facial pain.  Diagnoses and all orders for this visit:  Acute non-recurrent maxillary sinusitis Symptoms for last 4 days.  Recommended starting with sinus rinses, other symptomatic care.  If not improving, okay to start below.  For any worsening, let me know. -     amoxicillin-clavulanate (AUGMENTIN) 875-125 MG tablet; Take 1 tablet by mouth 2  (two) times daily.  Sarcoidosis No recent symptoms.  Follow up plan: Return if symptoms worsen or fail to improve. Assunta Found, MD [place]

## 2018-08-12 ENCOUNTER — Ambulatory Visit: Payer: BLUE CROSS/BLUE SHIELD | Admitting: Adult Health

## 2018-08-25 ENCOUNTER — Encounter: Payer: Self-pay | Admitting: Adult Health

## 2018-08-25 ENCOUNTER — Ambulatory Visit (INDEPENDENT_AMBULATORY_CARE_PROVIDER_SITE_OTHER): Payer: BLUE CROSS/BLUE SHIELD | Admitting: Adult Health

## 2018-08-25 VITALS — BP 117/76 | HR 55 | Ht 70.0 in | Wt 177.4 lb

## 2018-08-25 DIAGNOSIS — R202 Paresthesia of skin: Secondary | ICD-10-CM | POA: Diagnosis not present

## 2018-08-25 DIAGNOSIS — R209 Unspecified disturbances of skin sensation: Secondary | ICD-10-CM

## 2018-08-25 DIAGNOSIS — IMO0001 Reserved for inherently not codable concepts without codable children: Secondary | ICD-10-CM

## 2018-08-25 MED ORDER — TOPIRAMATE 25 MG PO TABS: 25.0000 mg | ORAL_TABLET | Freq: Two times a day (BID) | ORAL | 3 refills | Status: DC

## 2018-08-25 MED ORDER — GABAPENTIN 800 MG PO TABS: ORAL_TABLET | ORAL | 3 refills | Status: DC

## 2018-08-25 NOTE — Progress Notes (Signed)
I agree with the above plan 

## 2018-08-25 NOTE — Patient Instructions (Signed)
Your Plan:  Continue gabapentin  Take Topamax consistently  If your symptoms worsen or you develop new symptoms please let us know.   Thank you for coming to see [place] at [facility]. I hope we have been able to provide you high quality care today.  You may receive a patient satisfaction survey over the next few weeks. We would appreciate your feedback and comments so that we may continue to improve ourselves and the health of our patients.

## 2018-08-25 NOTE — Progress Notes (Signed)
PATIENT: Philip Hernandez DOB: 18-Nov-1955  REASON FOR VISIT: follow up HISTORY FROM: patient  HISTORY OF PRESENT ILLNESS: Today 08/25/18:  Philip Hernandez is a 62 year old male with a history of paresthesias in the feet.  He returns today for follow-up.  He remains on gabapentin 800 mg 4 times a day.  He states that there are times that he will miss doses and his pain worsens.  Patient was also advised to start taking Topamax consistently.  He states that he is taking it more than he was but not necessarily consistently.  He states that he always has burning and tingling in the feet although most the time it is tolerable during the day unless he misses a dose of gabapentin.  He denies any significant changes with his gait or balance.  He does state that he is off balance at times.  Fortunately he has not had any recent falls.  He returns today for evaluation.    HISTORY 02/03/18 Philip Hernandez is a 62 year old male with a history of paresthesias in the feet.  He returns today for follow-up.  He is currently on gabapentin 800 mg 4 times a day.  He reports that this work well most the time.  He states recently he has noticed  more pain in the legs.  He states that he does not take Topamax consistently.  Reports that he went to [place] recently and he did take Topamax daily and he felt that his pain was under better control.  Denies any significant changes with his gait or balance.  Denies any falls.  He returns today for evaluation.   REVIEW OF SYSTEMS: Out of a complete 14 system review of symptoms, the patient complains only of the following symptoms, and all other reviewed systems are negative.  See HPI  ALLERGIES: Allergies  Allergen Reactions  . Corn-Containing Products     HOME MEDICATIONS: Outpatient Medications Prior to Visit  Medication Sig Dispense Refill  . acetaminophen (TYLENOL) 500 MG tablet Take 500 mg by mouth every 6 (six) hours as needed.    . cetirizine (ZYRTEC) 10 MG tablet  Take 10 mg by mouth daily.    . cholecalciferol (VITAMIN D) 1000 UNITS tablet Take 1,000 Units by mouth daily.      . fluticasone (FLONASE) 50 MCG/ACT nasal spray Place 2 sprays into both nostrils daily. 16 g 6  . gabapentin (NEURONTIN) 800 MG tablet TAKE 1 TABLET BY MOUTH 4  TIMES DAILY 360 tablet 3  . Multiple Vitamins-Minerals (AIRBORNE PO) Take by mouth daily.    Marland Kitchen omeprazole (PRILOSEC) 40 MG capsule TAKE ONE (1) CAPSULE EACH DAY 30 capsule 5  . topiramate (TOPAMAX) 25 MG tablet Take 1 tablet (25 mg total) by mouth 2 (two) times daily. 180 tablet 1  . amoxicillin-clavulanate (AUGMENTIN) 875-125 MG tablet Take 1 tablet by mouth 2 (two) times daily. 20 tablet 0   No facility-administered medications prior to visit.     PAST MEDICAL HISTORY: Past Medical History:  Diagnosis Date  . Colon polyp   . Colon polyps 2015  . Cough    chronic sinus issues, GERD with poor dietary control and Pulmonary Sarcoid.   Marland Kitchen GERD (gastroesophageal reflux disease) Jan 2012   [name]. Bx proven.  Deve abd pain in Jan 2012 following high dose steroids for sarcoid. s/p EGD and improvement in symptoms following PPI  . Hypogonadism male   . Kidney stone   . Malignant neoplasm of prostate ([place]) dec 2010  [name]. s/p surgery dec 2010, hx of rnormal PSA  since then  . Other and unspecified mycoses   . Painful respiration   . Peripheral neuropathy   . Prostate cancer ([place])    reoccurence (getting radiation 01-14-17 start).  . Sarcoidosis dec 2011   stage 2 pulmonary, neck nodes, splenic granuloma  . Skin cancer   . Swelling, mass, or lump in head and neck     PAST SURGICAL HISTORY: Past Surgical History:  Procedure Laterality Date  . HERNIA REPAIR  13 yrs ago  . LYMPH NODE BIOPSY  05/2010, 07/2010  . nasal polyps  1984  . NASAL SINUS SURGERY  2013  . PROSTATE SURGERY  2010   radiation 2018  . SHOULDER SURGERY Right 2009, 2010   x 2  . SKIN CANCER EXCISION  11/16/14    FAMILY  HISTORY: Family History  Problem Relation Age of Onset  . Colon cancer Mother 25  . Skin cancer Mother   . Cancer Mother        colon  . Colon cancer Brother 55  . Skin cancer Brother   . Cancer Brother        colon  . Colon cancer Maternal Aunt        dx in her 75s  . Cancer Maternal Aunt        "cancer of male organs" dx in her 25s  . Breast cancer Maternal Aunt 66       bilateral, dx again at 59  . Colon cancer Maternal Aunt 73  . Stomach cancer Maternal Aunt 68  . Dementia Father   . Stomach cancer Maternal Uncle 110  . Skin cancer Paternal Aunt   . Prostate cancer Paternal Uncle 46  . Cancer Paternal Uncle        prostate  . Stomach cancer Paternal Grandmother        dx in her 71s  . Stroke Paternal Grandfather   . Colon cancer Maternal Aunt 68  . Prostate cancer Paternal Uncle   . Cancer Paternal Uncle        prostate  . Prostate cancer Paternal Uncle   . Kidney cancer Paternal Uncle   . Lung cancer Paternal Uncle   . Cancer Paternal Uncle        prostate  . Lung cancer Paternal Uncle   . Cancer Paternal Uncle        unknown  . Cancer Paternal Uncle        prostate  . Cancer Cousin        paternal cousin with cancer NOS  . Cancer Cousin        paternal cousin with cancer NOS    SOCIAL HISTORY: Social History   Socioeconomic History  . Marital status: Married    Spouse name: Not on file  . Number of children: 2  . Years of education: Not on file  . Highest education level: Not on file  Occupational History  . Occupation: unemployed  Social Needs  . Financial resource strain: Not on file  . Food insecurity:    Worry: Not on file    Inability: Not on file  . Transportation needs:    Medical: Not on file    Non-medical: Not on file  Tobacco Use  . Smoking status: Never Smoker  . Smokeless tobacco: Never Used  Substance and Sexual Activity  . Alcohol use: No  . Drug use: No  . Sexual activity: Yes  Lifestyle  .  Physical activity:    Days  per week: Not on file    Minutes per session: Not on file  . Stress: Not on file  Relationships  . Social connections:    Talks on phone: Not on file    Gets together: Not on file    Attends religious service: Not on file    Active member of club or organization: Not on file    Attends meetings of clubs or organizations: Not on file    Relationship status: Not on file  . Intimate partner violence:    Fear of current or ex partner: Not on file    Emotionally abused: Not on file    Physically abused: Not on file    Forced sexual activity: Not on file  Other Topics Concern  . Not on file  Social History Narrative   Married   2 children      PHYSICAL EXAM  Vitals:   08/25/18 1125  BP: 117/76  Pulse: (!) 55  Weight: 177 lb 6.4 oz (80.5 kg)  Height: 5\' 10"  (1.778 m)   Body mass index is 25.45 kg/m.  Generalized: Well developed, in no acute distress   Neurological examination  Mentation: Alert oriented to time, place, history taking. Follows all commands speech and language fluent Cranial nerve II-XII: Pupils were equal round reactive to light. Extraocular movements were full, visual field were full on confrontational test. Facial sensation and strength were normal. Uvula tongue midline. Head turning and shoulder shrug  were normal and symmetric. Motor: The motor testing reveals 5 over 5 strength of all 4 extremities. Good symmetric motor tone is noted throughout.  Sensory: Sensory testing is intact to soft touch on all 4 extremities. No evidence of extinction is noted.  Coordination: Cerebellar testing reveals good finger-nose-finger and heel-to-shin bilaterally.  Gait and station: Gait is normal. Tandem gait is slightly unsteady.  Romberg is negative. No drift is seen.  Reflexes: Deep tendon reflexes are symmetric and normal bilaterally.   DIAGNOSTIC DATA (LABS, IMAGING, TESTING) - I reviewed patient records, labs, notes, testing and imaging myself where available.  Lab  Results  Component Value Date   WBC 6.3 07/03/2018   HGB 14.2 07/03/2018   HCT 41.7 07/03/2018   MCV 86 07/03/2018   PLT 138 (L) 07/03/2018      Component Value Date/Time   NA 142 07/03/2018 1320   K 4.8 07/03/2018 1320   CL 103 07/03/2018 1320   CO2 26 07/03/2018 1320   GLUCOSE 75 07/03/2018 1320   GLUCOSE 80 03/26/2013 1220   BUN 16 07/03/2018 1320   CREATININE 1.08 07/03/2018 1320   CREATININE 1.15 03/26/2013 1220   CALCIUM 9.9 07/03/2018 1320   PROT 6.6 07/03/2018 1320   ALBUMIN 4.1 07/03/2018 1320   AST 17 07/03/2018 1320   ALT 20 07/03/2018 1320   ALKPHOS 69 07/03/2018 1320   BILITOT 0.7 07/03/2018 1320   GFRNONAA 74 07/03/2018 1320   GFRNONAA 71 03/26/2013 1220   GFRAA 85 07/03/2018 1320   GFRAA 82 03/26/2013 1220   Lab Results  Component Value Date   CHOL 182 07/03/2018   HDL 35 (L) 07/03/2018   LDLCALC 104 (H) 07/03/2018   TRIG 217 (H) 07/03/2018   CHOLHDL 5.2 (H) 07/03/2018   No results found for: HGBA1C No results found for: VITAMINB12 Lab Results  Component Value Date   TSH 2.870 04/10/2017      ASSESSMENT AND PLAN 62 y.o. year old male  has a  past medical history of Colon polyp, Colon polyps (2015), Cough, GERD (gastroesophageal reflux disease) (Jan 2012), Hypogonadism male, Kidney stone, Malignant neoplasm of prostate ([place]) (dec 2010), Other and unspecified mycoses, Painful respiration, Peripheral neuropathy, Prostate cancer ([place]), Sarcoidosis (dec 2011), Skin cancer, and Swelling, mass, or lump in head and neck. here with :  1.  Paresthesias in the lower extremities  The patient was encouraged to take his medication consistently and not miss any doses.  He was advised that if he takes Topamax consistently, if it does not offer him any benefit we may consider increasing the dose.  Patient voiced understanding.  If his symptoms worsen or he develops new symptoms he should let us know.  He will follow-up in 6 months or sooner if needed.   I spent 15  minutes with the patient. 50% of this time was spent discussing plan of care   [name], MSN, NP-C 08/25/2018, 11:45 AM [facility] [address], [place], [place] [address] [phone]

## 2018-10-17 ENCOUNTER — Ambulatory Visit (INDEPENDENT_AMBULATORY_CARE_PROVIDER_SITE_OTHER): Payer: BLUE CROSS/BLUE SHIELD | Admitting: Family Medicine

## 2018-10-17 ENCOUNTER — Encounter: Payer: Self-pay | Admitting: Family Medicine

## 2018-10-17 VITALS — BP 120/74 | HR 62 | Temp 97.1°F | Ht 70.0 in | Wt 175.2 lb

## 2018-10-17 DIAGNOSIS — M7711 Lateral epicondylitis, right elbow: Secondary | ICD-10-CM

## 2018-10-17 MED ORDER — PREDNISONE 20 MG PO TABS: ORAL_TABLET | ORAL | 0 refills | Status: DC

## 2018-10-17 NOTE — Progress Notes (Signed)
   BP 120/74   Pulse 62   Temp (!) 97.1 F (36.2 C) (Oral)   Ht 5\' 10"  (1.778 m)   Wt 175 lb 3.2 oz (79.5 kg)   BMI 25.14 kg/m    Subjective:    Patient ID: Philip Hernandez, male    DOB: 06/18/1956, 63 y.o.   MRN: [id number]  HPI: Philip Hernandez is a 63 y.o. male presenting on 10/17/2018 for Elbow Pain (right x 2 months.)   HPI Patient is coming in with pain in his right elbow x 2 mo. He states for the last couple years he has been grocery shopping for his mother so he pushes one cart with his right arm and pulls the second cart behind him with his left arm. He has at times felt pains in his right shoulder and wrist from trying to turn the grocery cart with one arm, but those pains have resolved. The elbow pain is located on his right lateral epicondyle and is painful when he makes a fist. It does not radiate up or down his arm. He has not tried anything for the pain. It has not gotten better or worse in the last couple months.  Relevant past medical, surgical, family and social history reviewed and updated as indicated. Interim medical history since our last visit reviewed. Allergies and medications reviewed and updated.  Review of Systems  Respiratory: Negative for shortness of breath.   Cardiovascular: Negative for chest pain.  Musculoskeletal: Positive for arthralgias (right elbow pain).  Neurological: Negative for weakness.    Per HPI unless specifically indicated above     Objective:    BP 120/74   Pulse 62   Temp (!) 97.1 F (36.2 C) (Oral)   Ht 5\' 10"  (1.778 m)   Wt 175 lb 3.2 oz (79.5 kg)   BMI 25.14 kg/m   Wt Readings from Last 3 Encounters:  10/17/18 175 lb 3.2 oz (79.5 kg)  08/25/18 177 lb 6.4 oz (80.5 kg)  08/06/18 178 lb (80.7 kg)    Physical Exam Constitutional:      General: He is not in acute distress.    Appearance: Normal appearance.  Musculoskeletal:     Right elbow: He exhibits normal range of motion, no swelling and no deformity. Tenderness found.  Lateral epicondyle tenderness noted.       Assessment & Plan:   Patient was educated about tennis elbow and is hesitant to take Ibuprofen due to GI issues in the past.  He will do a 5-day course of Prednisone, ice affected area, and continue movements.  Problem List Items Addressed This Visit    None    Visit Diagnoses    Right tennis elbow    -  Primary   Relevant Medications   predniSONE (DELTASONE) 20 MG tablet       Follow up plan: Return if symptoms worsen or fail to improve.  Counseling provided for all of the vaccine components No orders of the defined types were placed in this encounter.   [place], PA-S [facility] 10/17/2018, 12:59 PM  Patient seen and examined with PA student. Agree with assessment and plan above.  We will treat conservatively with prednisone for now and will see back if does not resolve or worsens, attempts to change the way that he does shopping so that it does not irritate again. [name], MD [facility] 10/17/2018, 12:59 PM

## 2018-11-07 DIAGNOSIS — C61 Malignant neoplasm of prostate: Secondary | ICD-10-CM | POA: Diagnosis not present

## 2018-12-05 ENCOUNTER — Ambulatory Visit: Payer: BLUE CROSS/BLUE SHIELD | Admitting: Family Medicine

## 2018-12-09 ENCOUNTER — Telehealth: Payer: Self-pay

## 2018-12-09 NOTE — Telephone Encounter (Signed)
PT turn form in for renewal of handicap sticker form. Pt last had it done by former employer [employer] in 2015,and it was good for 5 years. Dr. Leonie Man review the patients chart and stated he will do another handicap form for pt. Pt has neuropathy and we are treating it for him. Pt has been following at our office every 6 months. Per Dr. Leonie Man, form can be done but pt has to be come to visits as schedule.

## 2018-12-09 NOTE — Telephone Encounter (Signed)
Form given to medical records.

## 2018-12-15 ENCOUNTER — Other Ambulatory Visit: Payer: Self-pay

## 2018-12-16 ENCOUNTER — Encounter: Payer: Self-pay | Admitting: Family Medicine

## 2018-12-16 ENCOUNTER — Ambulatory Visit (INDEPENDENT_AMBULATORY_CARE_PROVIDER_SITE_OTHER): Payer: BLUE CROSS/BLUE SHIELD | Admitting: Family Medicine

## 2018-12-16 VITALS — BP 143/80 | HR 53 | Temp 97.2°F | Ht 70.0 in | Wt 177.0 lb

## 2018-12-16 DIAGNOSIS — M7711 Lateral epicondylitis, right elbow: Secondary | ICD-10-CM

## 2018-12-16 MED ORDER — METHYLPREDNISOLONE ACETATE 80 MG/ML IJ SUSP
40.0000 mg | Freq: Once | INTRAMUSCULAR | Status: AC
  Administered 2018-12-16: 40 mg via INTRAMUSCULAR

## 2018-12-16 NOTE — Progress Notes (Signed)
BP (!) 143/80   Pulse (!) 53   Temp (!) 97.2 F (36.2 C) (Oral)   Ht 5\' 10"  (1.778 m)   Wt 177 lb (80.3 kg)   BMI 25.40 kg/m    Subjective:   Patient ID: Philip Hernandez, male    DOB: 09-Jan-1956, 63 y.o.   MRN: [id number]  HPI: Philip Hernandez is a 63 y.o. male presenting on 12/16/2018 for Elbow Pain (right- patient states it is no better and would like an injection)   HPI Continued right elbow pain Patient had some relief initially with the prednisone 2 months ago but the right elbow pain has been continuing to cause him problems as it came back not too long after he finished the prednisone course from previous.  He says it hurts on the lateral aspect of his right elbow and he does have to do a lot of pushing with that right arm and he did try and baby it and it was a little bit better but now is come back.  Patient denies any redness or warmth, he does have minimal swelling he says.  Pain hurts more with flexion and grip of that right arm/wrist.  Relevant past medical, surgical, family and social history reviewed and updated as indicated. Interim medical history since our last visit reviewed. Allergies and medications reviewed and updated.  Review of Systems  Constitutional: Negative for chills and fever.  Eyes: Negative for visual disturbance.  Respiratory: Negative for shortness of breath and wheezing.   Cardiovascular: Negative for chest pain and leg swelling.  Musculoskeletal: Positive for arthralgias. Negative for back pain and gait problem.  Skin: Negative for color change, rash and wound.  All other systems reviewed and are negative.   Per HPI unless specifically indicated above   Allergies as of 12/16/2018      Reactions   Corn-containing Products       Medication List       Accurate as of December 16, 2018  1:10 PM. Always use your most recent med list.        acetaminophen 500 MG tablet Commonly known as:  TYLENOL Take 500 mg by mouth every 6 (six) hours as  needed.   AIRBORNE PO Take by mouth daily.   cetirizine 10 MG tablet Commonly known as:  ZYRTEC Take 10 mg by mouth daily.   cholecalciferol 1000 units tablet Commonly known as:  VITAMIN D Take 1,000 Units by mouth daily.   fluticasone 50 MCG/ACT nasal spray Commonly known as:  FLONASE Place 2 sprays into both nostrils daily.   gabapentin 800 MG tablet Commonly known as:  NEURONTIN TAKE 1 TABLET BY MOUTH 4  TIMES DAILY   omeprazole 40 MG capsule Commonly known as:  PRILOSEC TAKE ONE (1) CAPSULE EACH DAY   topiramate 25 MG tablet Commonly known as:  TOPAMAX Take 1 tablet (25 mg total) by mouth 2 (two) times daily.        Objective:   BP (!) 143/80   Pulse (!) 53   Temp (!) 97.2 F (36.2 C) (Oral)   Ht 5\' 10"  (1.778 m)   Wt 177 lb (80.3 kg)   BMI 25.40 kg/m   Wt Readings from Last 3 Encounters:  12/16/18 177 lb (80.3 kg)  10/17/18 175 lb 3.2 oz (79.5 kg)  08/25/18 177 lb 6.4 oz (80.5 kg)    Physical Exam Vitals signs and nursing note reviewed.  Constitutional:      General: He is not in acute  distress.    Appearance: He is well-developed. He is not diaphoretic.  Eyes:     General: No scleral icterus.    Conjunctiva/sclera: Conjunctivae normal.  Neck:     Thyroid: No thyromegaly.  Musculoskeletal:     Right elbow: He exhibits normal range of motion, no swelling, no effusion, no deformity and no laceration. Tenderness found. Lateral epicondyle tenderness noted.  Skin:    General: Skin is warm and dry.     Findings: No rash.  Neurological:     Mental Status: He is alert and oriented to person, place, and time.     Coordination: Coordination normal.  Psychiatric:        Behavior: Behavior normal.     Right lateral epicondylitis injection: Consent form signed. Risk factors of bleeding and infection discussed with patient and patient is agreeable towards injection. Patient prepped with Betadine. Lateral approach towards injection used. Injected 40 mg of  Depo-Medrol and 1 mL of 2% lidocaine. Patient tolerated procedure well and no side effects from noted. Minimal to no bleeding. Simple bandage applied after.   Assessment & Plan:   Problem List Items Addressed This Visit    None    Visit Diagnoses    Right tennis elbow    -  Primary   Relevant Medications   methylPREDNISolone acetate (DEPO-MEDROL) injection 40 mg (Start on 12/16/2018  1:15 PM)       Follow up plan: Return if symptoms worsen or fail to improve.  Counseling provided for all of the vaccine components No orders of the defined types were placed in this encounter.   [name], MD [facility] 12/16/2018, 1:10 PM

## 2018-12-30 ENCOUNTER — Other Ambulatory Visit: Payer: BLUE CROSS/BLUE SHIELD

## 2018-12-30 ENCOUNTER — Other Ambulatory Visit: Payer: Self-pay

## 2018-12-30 DIAGNOSIS — E785 Hyperlipidemia, unspecified: Secondary | ICD-10-CM

## 2018-12-30 DIAGNOSIS — E782 Mixed hyperlipidemia: Secondary | ICD-10-CM

## 2018-12-31 LAB — CMP14+EGFR
ALT: 14 IU/L (ref 0–44)
AST: 15 IU/L (ref 0–40)
Albumin/Globulin Ratio: 1.9 (ref 1.2–2.2)
Albumin: 4.5 g/dL (ref 3.8–4.8)
Alkaline Phosphatase: 76 IU/L (ref 39–117)
BUN/Creatinine Ratio: 12 (ref 10–24)
BUN: 15 mg/dL (ref 8–27)
Bilirubin Total: 0.8 mg/dL (ref 0.0–1.2)
CO2: 24 mmol/L (ref 20–29)
Calcium: 10 mg/dL (ref 8.6–10.2)
Chloride: 106 mmol/L (ref 96–106)
Creatinine, Ser: 1.26 mg/dL (ref 0.76–1.27)
GFR calc Af Amer: 71 mL/min/{1.73_m2} (ref >59)
GFR calc non Af Amer: 61 mL/min/{1.73_m2} (ref >59)
Globulin, Total: 2.4 g/dL (ref 1.5–4.5)
Glucose: 77 mg/dL (ref 65–99)
Potassium: 4.5 mmol/L (ref 3.5–5.2)
Sodium: 145 mmol/L — ABNORMAL HIGH (ref 134–144)
Total Protein: 6.9 g/dL (ref 6.0–8.5)

## 2018-12-31 LAB — CBC WITH DIFFERENTIAL/PLATELET
Basophils Absolute: 0.1 10*3/uL (ref 0.0–0.2)
Basos: 1 %
EOS (ABSOLUTE): 0.2 10*3/uL (ref 0.0–0.4)
Eos: 3 %
Hematocrit: 42.2 % (ref 37.5–51.0)
Hemoglobin: 14.4 g/dL (ref 13.0–17.7)
Immature Grans (Abs): 0 10*3/uL (ref 0.0–0.1)
Immature Granulocytes: 0 %
Lymphocytes Absolute: 2 10*3/uL (ref 0.7–3.1)
Lymphs: 28 %
MCH: 28.9 pg (ref 26.6–33.0)
MCHC: 34.1 g/dL (ref 31.5–35.7)
MCV: 85 fL (ref 79–97)
Monocytes Absolute: 0.7 10*3/uL (ref 0.1–0.9)
Monocytes: 10 %
Neutrophils Absolute: 4.2 10*3/uL (ref 1.4–7.0)
Neutrophils: 58 %
Platelets: 150 10*3/uL (ref 150–450)
RBC: 4.98 x10E6/uL (ref 4.14–5.80)
RDW: 12.9 % (ref 11.6–15.4)
WBC: 7.2 10*3/uL (ref 3.4–10.8)

## 2018-12-31 LAB — PSA, TOTAL AND FREE
PSA, Free: <0.02 ng/mL
Prostate Specific Ag, Serum: <0.1 ng/mL (ref 0.0–4.0)

## 2018-12-31 LAB — LIPID PANEL
Chol/HDL Ratio: 4.3 ratio (ref 0.0–5.0)
Cholesterol, Total: 165 mg/dL (ref 100–199)
HDL: 38 mg/dL — ABNORMAL LOW (ref >39)
LDL Calculated: 102 mg/dL — ABNORMAL HIGH (ref 0–99)
Triglycerides: 123 mg/dL (ref 0–149)
VLDL Cholesterol Cal: 25 mg/dL (ref 5–40)

## 2019-01-05 ENCOUNTER — Encounter: Payer: Self-pay | Admitting: Family Medicine

## 2019-01-05 ENCOUNTER — Other Ambulatory Visit: Payer: Self-pay

## 2019-01-05 ENCOUNTER — Ambulatory Visit (INDEPENDENT_AMBULATORY_CARE_PROVIDER_SITE_OTHER): Payer: BLUE CROSS/BLUE SHIELD | Admitting: Family Medicine

## 2019-01-05 ENCOUNTER — Ambulatory Visit (INDEPENDENT_AMBULATORY_CARE_PROVIDER_SITE_OTHER): Payer: BLUE CROSS/BLUE SHIELD

## 2019-01-05 VITALS — BP 129/77 | HR 65 | Temp 97.2°F | Ht 70.0 in | Wt 172.8 lb

## 2019-01-05 DIAGNOSIS — K219 Gastro-esophageal reflux disease without esophagitis: Secondary | ICD-10-CM | POA: Diagnosis not present

## 2019-01-05 DIAGNOSIS — R0600 Dyspnea, unspecified: Secondary | ICD-10-CM

## 2019-01-05 DIAGNOSIS — R0789 Other chest pain: Secondary | ICD-10-CM

## 2019-01-05 DIAGNOSIS — R0609 Other forms of dyspnea: Secondary | ICD-10-CM

## 2019-01-05 DIAGNOSIS — E782 Mixed hyperlipidemia: Secondary | ICD-10-CM

## 2019-01-05 DIAGNOSIS — R079 Chest pain, unspecified: Secondary | ICD-10-CM | POA: Diagnosis not present

## 2019-01-05 MED ORDER — FLUTICASONE PROPIONATE 50 MCG/ACT NA SUSP: 2.0000 | Freq: Every day | NASAL | 6 refills | Status: DC

## 2019-01-05 MED ORDER — OMEPRAZOLE 40 MG PO CPDR: 40.0000 mg | DELAYED_RELEASE_CAPSULE | Freq: Every day | ORAL | 3 refills | Status: DC

## 2019-01-05 NOTE — Progress Notes (Signed)
BP 129/77   Pulse 65   Temp (!) 97.2 F (36.2 C) (Oral)   Ht 5\' 10"  (1.778 m)   Wt 172 lb 12.8 oz (78.4 kg)   BMI 24.79 kg/m    Subjective:   Patient ID: Philip Hernandez, male    DOB: April 23, 1956, 63 y.o.   MRN: [id number]  HPI: Philip Hernandez is a 63 y.o. male presenting on 01/05/2019 for Hyperlipidemia (6 month follow up) and Chest Pain (middle of chest- x 6 months on and off)   HPI Patient comes in today for recheck and also complaining of chest pain and chest pressure.  He says it is a dull achy pressure that he gets in the center of his chest, he says initially started about 6 months ago and he thought it was related to doing some lifting or pushing and pulling on something and thought it was muscular but now it is increasing in frequency but not severity.  He says he will fill it a little bit all the time but he definitely feels it more when he is exerting himself.  He says he started to feel now worries possibly even a little short of breath with that as well.  Patient denies any chest pain or shortness of breath currently or at rest but it is mainly on exertion that he feels it.  He says he is started to feel that even walking upstairs which is not something that is had previously.  He says rest makes it better and it has been happening more and more quickly on exertion than it did previously.  Hyperlipidemia Patient is coming in for recheck of his hyperlipidemia. The patient is currently taking no medication but his cholesterol numbers look a lot better than they had previously on his last blood draw.. They deny any issues with myalgias or history of liver damage from it. They deny any focal numbness or weakness or chest pain.   GERD Patient is currently on omeprazole.  She denies any major symptoms or abdominal pain or belching or burping. She denies any blood in her stool or lightheadedness or dizziness.  He is having chest pains but does not know if that is due to acid reflux or heart  or lungs because he also has sarcoidosis in his history and some shortness of breath.  Relevant past medical, surgical, family and social history reviewed and updated as indicated. Interim medical history since our last visit reviewed. Allergies and medications reviewed and updated.  Review of Systems  Constitutional: Negative for chills and fever.  HENT: Negative for congestion.   Eyes: Negative for visual disturbance.  Respiratory: Positive for chest tightness and shortness of breath. Negative for cough and wheezing.   Cardiovascular: Positive for chest pain and palpitations. Negative for leg swelling.  Musculoskeletal: Negative for back pain and gait problem.  Skin: Negative for rash.  Neurological: Negative for dizziness, weakness, light-headedness and numbness.  All other systems reviewed and are negative.   Per HPI unless specifically indicated above   Allergies as of 01/05/2019      Reactions   Corn-containing Products       Medication List       Accurate as of January 05, 2019 11:14 AM. Always use your most recent med list.        acetaminophen 500 MG tablet Commonly known as:  TYLENOL Take 500 mg by mouth every 6 (six) hours as needed.   AIRBORNE PO Take by mouth daily.   cetirizine  10 MG tablet Commonly known as:  ZYRTEC Take 10 mg by mouth daily.   cholecalciferol 1000 units tablet Commonly known as:  VITAMIN D Take 1,000 Units by mouth daily.   fluticasone 50 MCG/ACT nasal spray Commonly known as:  FLONASE Place 2 sprays into both nostrils daily.   gabapentin 800 MG tablet Commonly known as:  NEURONTIN TAKE 1 TABLET BY MOUTH 4  TIMES DAILY   omeprazole 40 MG capsule Commonly known as:  PRILOSEC Take 1 capsule (40 mg total) by mouth daily.   topiramate 25 MG tablet Commonly known as:  TOPAMAX Take 1 tablet (25 mg total) by mouth 2 (two) times daily.        Objective:   BP 129/77   Pulse 65   Temp (!) 97.2 F (36.2 C) (Oral)   Ht 5\' 10"   (1.778 m)   Wt 172 lb 12.8 oz (78.4 kg)   BMI 24.79 kg/m   Wt Readings from Last 3 Encounters:  01/05/19 172 lb 12.8 oz (78.4 kg)  12/16/18 177 lb (80.3 kg)  10/17/18 175 lb 3.2 oz (79.5 kg)    Physical Exam Vitals signs and nursing note reviewed.  Constitutional:      General: He is not in acute distress.    Appearance: He is well-developed. He is not diaphoretic.  Eyes:     General: No scleral icterus.       Right eye: No discharge.     Conjunctiva/sclera: Conjunctivae normal.     Pupils: Pupils are equal, round, and reactive to light.  Neck:     Musculoskeletal: Neck supple.     Thyroid: No thyromegaly.  Cardiovascular:     Rate and Rhythm: Normal rate and regular rhythm.     Heart sounds: Normal heart sounds. No murmur. No friction rub. No S3 or S4 sounds.   Pulmonary:     Effort: Pulmonary effort is normal. No accessory muscle usage or respiratory distress.     Breath sounds: Normal breath sounds. No stridor. No decreased breath sounds, wheezing, rhonchi or rales.  Chest:     Chest wall: No deformity, tenderness or crepitus.  Musculoskeletal: Normal range of motion.  Lymphadenopathy:     Cervical: No cervical adenopathy.  Skin:    General: Skin is warm and dry.     Findings: No rash.  Neurological:     Mental Status: He is alert and oriented to person, place, and time.     Coordination: Coordination normal.  Psychiatric:        Behavior: Behavior normal.     EKG: Sinus rhythm with 1 PVC, left axis deviation, otherwise normal, unchanged except for PVC from 2014 EKG  Chest x-ray: Await final read from radiologist  Assessment & Plan:   Problem List Items Addressed This Visit      Digestive   GERD   Relevant Medications   omeprazole (PRILOSEC) 40 MG capsule     Other   HLD (hyperlipidemia)    Other Visit Diagnoses    Atypical chest pain    -  Primary   Relevant Orders   EKG 12-Lead (Completed)   Ambulatory referral to Cardiology   DG Chest 2 View    Exertional dyspnea       Relevant Orders   Ambulatory referral to Cardiology   DG Chest 2 View    Based on symptoms and that it is mostly on exertion and feels shortness of breath with that, concern for exertional dyspnea and atypical chest  pain, will send back to cardiology for possible testing, he has seen Dr. Johnsie Cancel previously  Await chest x-ray as well for possible severity of sarcoidosis and may consider pulmonology referral if that shows up.  If both of those come back normal then we may consider going to gastroenterology in the future  Follow up plan: Return in about 4 months (around 05/07/2019), or if symptoms worsen or fail to improve, for Cholesterol and GERD follow-up.  Counseling provided for all of the vaccine components Orders Placed This Encounter  Procedures  . DG Chest 2 View  . Ambulatory referral to Cardiology  . EKG 12-Lead    [name], MD [facility] 01/05/2019, 11:14 AM

## 2019-01-18 NOTE — Progress Notes (Signed)
Virtual Visit via Video Note   This visit type was conducted due to national recommendations for restrictions regarding the COVID-19 Pandemic (e.g. social distancing) in an effort to limit this patient's exposure and mitigate transmission in our community.  Due to his co-morbid illnesses, this patient is at least at moderate risk for complications without adequate follow up.  This format is felt to be most appropriate for this patient at this time.  All issues noted in this document were discussed and addressed.  A limited physical exam was performed with this format.  Please refer to the patient's chart for his consent to telehealth for [facility].   Date:  01/20/2019   ID:  Keturah Barre, DOB July 31, 1956, MRN [id number]  Patient Location: Home Provider Location: Office  PCP:  Dettinger, Fransisca Kaufmann, MD  Cardiologist:  Karma Greaser Electrophysiologist:  None   Evaluation Performed:  New Patient Evaluation  Chief Complaint:  Chest Pain   History of Present Illness:    Philip Hernandez is a 64 y.o. male referred by Dr Dettinger for chest pain. Reviewed his office note from 01/05/19 CRF;s HLD  Also has GERD. No documented CAD. Atypical SSCP. Dull achy pain center of chest. Going on for 6 months Positional worse with UE pushing and pulling ? Muscular. Concern that associated with dyspnea with exertion and going up stairs Has a history of ? Quiescent sarcoid as well as prostate cancer with normal PSA 12/30/18   CXR 01/05/19 NAD aortic atherosclerosis  Lab review shows LDL 102 12/30/18   The patient does not have symptoms concerning for COVID-19 infection (fever, chills, cough, or new shortness of breath).    Past Medical History:  Diagnosis Date  . Colon polyp   . Colon polyps 2015  . Cough    chronic sinus issues, GERD with poor dietary control and Pulmonary Sarcoid.   Marland Kitchen GERD (gastroesophageal reflux disease) Jan 2012   [name]. Bx proven.  Deve abd pain in Jan 2012 following high dose  steroids for sarcoid. s/p EGD and improvement in symptoms following PPI  . Hypogonadism male   . Kidney stone   . Malignant neoplasm of prostate [facility]) dec 2010   [name]. s/p surgery dec 2010, hx of rnormal PSA  since then  . Other and unspecified mycoses   . Painful respiration   . Peripheral neuropathy   . Prostate cancer ([place])    reoccurence (getting radiation 01-14-17 start).  . Sarcoidosis dec 2011   stage 2 pulmonary, neck nodes, splenic granuloma  . Skin cancer   . Swelling, mass, or lump in head and neck    Past Surgical History:  Procedure Laterality Date  . HERNIA REPAIR  13 yrs ago  . LYMPH NODE BIOPSY  05/2010, 07/2010  . nasal polyps  1984  . NASAL SINUS SURGERY  2013  . PROSTATE SURGERY  2010   radiation 2018  . SHOULDER SURGERY Right 2009, 2010   x 2  . SKIN CANCER EXCISION  11/16/14     Current Meds  Medication Sig  . acetaminophen (TYLENOL) 500 MG tablet Take 500 mg by mouth every 6 (six) hours as needed.  . cetirizine (ZYRTEC) 10 MG tablet Take 10 mg by mouth daily.  . cholecalciferol (VITAMIN D) 1000 UNITS tablet Take 1,000 Units by mouth daily.    . fluticasone (FLONASE) 50 MCG/ACT nasal spray Place 2 sprays into both nostrils daily.  Marland Kitchen gabapentin (NEURONTIN) 800 MG tablet TAKE 1 TABLET BY  MOUTH 4  TIMES DAILY  . Multiple Vitamins-Minerals (AIRBORNE PO) Take by mouth daily.  Marland Kitchen omeprazole (PRILOSEC) 40 MG capsule Take 1 capsule (40 mg total) by mouth daily.  Marland Kitchen topiramate (TOPAMAX) 25 MG tablet Take 1 tablet (25 mg total) by mouth 2 (two) times daily.     Allergies:   Corn-containing products   Social History   Tobacco Use  . Smoking status: Never Smoker  . Smokeless tobacco: Never Used  Substance Use Topics  . Alcohol use: No  . Drug use: No     Family Hx: The patient's family history includes Breast cancer (age of onset: 41) in his maternal aunt; Cancer in his brother, cousin, cousin, maternal aunt, mother, paternal uncle, paternal uncle,  paternal uncle, paternal uncle, and paternal uncle; Colon cancer in his maternal aunt; Colon cancer (age of onset: 57) in his maternal aunt; Colon cancer (age of onset: 29) in his mother; Colon cancer (age of onset: 68) in his brother; Colon cancer (age of onset: 37) in his maternal aunt; Dementia in his father; Kidney cancer in his paternal uncle; Lung cancer in his paternal uncle and paternal uncle; Prostate cancer in his paternal uncle and paternal uncle; Prostate cancer (age of onset: 65) in his paternal uncle; Skin cancer in his brother, mother, and paternal aunt; Stomach cancer in his paternal grandmother; Stomach cancer (age of onset: 40) in his maternal uncle; Stomach cancer (age of onset: 6) in his maternal aunt; Stroke in his paternal grandfather.  ROS:   Please see the history of present illness.     All other systems reviewed and are negative.   Prior CV studies:   The following studies were reviewed today:  CXR, notes primary labs and ECG 01/05/19  Labs/Other Tests and Data Reviewed:    EKG:   SR poor R wave progression rate 62  Recent Labs: 12/30/2018: ALT 14; BUN 15; Creatinine, Ser 1.26; Hemoglobin 14.4; Platelets 150; Potassium 4.5; Sodium 145   Recent Lipid Panel Lab Results  Component Value Date/Time   CHOL 165 12/30/2018 02:25 PM   CHOL 150 03/26/2013 12:20 PM   TRIG 123 12/30/2018 02:25 PM   TRIG 89 05/25/2014 09:20 AM   TRIG 172 (H) 03/26/2013 12:20 PM   HDL 38 (L) 12/30/2018 02:25 PM   HDL 36 (L) 05/25/2014 09:20 AM   HDL 27 (L) 03/26/2013 12:20 PM   CHOLHDL 4.3 12/30/2018 02:25 PM   LDLCALC 102 (H) 12/30/2018 02:25 PM   LDLCALC 86 05/25/2014 09:20 AM   LDLCALC 89 03/26/2013 12:20 PM    Wt Readings from Last 3 Encounters:  01/20/19 74.8 kg  01/05/19 78.4 kg  12/16/18 80.3 kg     Objective:    Vital Signs:  Temp (!) 97.1 F (36.2 C)   Ht 5\' 10"  (1.778 m)   Wt 74.8 kg   BMI 23.68 kg/m    No distress No tachpnea No JVP elevation  Skin warm and  dry No edema  ASSESSMENT & PLAN:    1. Chest Pain:  With HLD not on Rx. Discussed options with patient in light of COVID 19 restrictions  Best test for him that would also image chest for sarcoid is cardiac CTA will try to schedule in next 4 weeks will also call in SL nitro to see if it helps  2. HLD:  Given aortic atherosclerosis on CXR And SSCP favor low dose statin Rx  Will see what calcium score is  3. Sarcoid:  History of  suggest primary order chest CT to further evaluate CXR did not comment on mediastinal adenopathy see above  4. Prostate cancer.  PSA Less than 1 12/30/18 f/u urology [place] patient indicates PSA was higher in February when [facility] checked it He is post prostatectomy   COVID-19 Education: The signs and symptoms of COVID-19 were discussed with the patient and how to seek care for testing (follow up with PCP or arrange E-visit).  The importance of social distancing was discussed today.  Time:   Today, I have spent 30 minutes with the patient with telehealth technology discussing the above problems.     Medication Adjustments/Labs and Tests Ordered: Current medicines are reviewed at length with the patient today.  Concerns regarding medicines are outlined above.   Tests Ordered: No orders of the defined types were placed in this encounter.   Medication Changes: No orders of the defined types were placed in this encounter.   Disposition:  Follow up f/u in 3 months if testing normal   Signed, Jenkins Rouge, MD  01/20/2019 1:09 PM    [place]

## 2019-01-20 ENCOUNTER — Encounter: Payer: Self-pay | Admitting: Cardiovascular Disease

## 2019-01-20 ENCOUNTER — Telehealth (INDEPENDENT_AMBULATORY_CARE_PROVIDER_SITE_OTHER): Payer: BC Managed Care – PPO | Admitting: Cardiovascular Disease

## 2019-01-20 VITALS — Temp 97.1°F | Ht 70.0 in | Wt 165.0 lb

## 2019-01-20 DIAGNOSIS — R079 Chest pain, unspecified: Secondary | ICD-10-CM | POA: Diagnosis not present

## 2019-01-20 NOTE — Addendum Note (Signed)
Addended by: Debbora Lacrosse R on: 01/20/2019 02:22 PM   Modules accepted: Orders

## 2019-01-20 NOTE — Progress Notes (Signed)
Medication Instructions:   Your physician recommends that you continue on your current medications as directed. Please refer to the Current Medication list given to you today.   Labwork: BMET  Testing/Procedures: Cardiac CTA   Follow-Up: Your physician recommends that you schedule a follow-up appointment in: 3 MONTHS    Any Other Special Instructions Will Be Listed Below (If Applicable).     If you need a refill on your cardiac medications before your next appointment, please call your pharmacy.

## 2019-01-20 NOTE — Addendum Note (Signed)
Addended by: Barbarann Ehlers A on: 01/20/2019 02:03 PM   Modules accepted: Orders

## 2019-01-22 ENCOUNTER — Other Ambulatory Visit: Payer: Self-pay | Admitting: Cardiovascular Disease

## 2019-01-22 MED ORDER — NITROGLYCERIN 0.4 MG SL SUBL: 0.4000 mg | SUBLINGUAL_TABLET | SUBLINGUAL | 3 refills | Status: DC | PRN

## 2019-01-22 NOTE — Telephone Encounter (Signed)
This is a [place] pt.  °

## 2019-01-22 NOTE — Telephone Encounter (Signed)
Rx sent 

## 2019-01-22 NOTE — Telephone Encounter (Signed)
New Message     *STAT* If patient is at the pharmacy, call can be transferred to refill team.   1. Which medications need to be refilled? (please list name of each medication and dose if known) Nitroglycerin   2. Which pharmacy/location (including street and city if local pharmacy) is medication to be sent to? The drug store in [place]  3. Do they need a 30 day or 90 day supply?   64 or 93 Pt says Dr Johnsie Cancel was suppose to send them in and there is nothing at the pharmacy for him

## 2019-01-28 ENCOUNTER — Telehealth (HOSPITAL_COMMUNITY): Payer: Self-pay | Admitting: Emergency Medicine

## 2019-01-28 NOTE — Telephone Encounter (Signed)
Left message on voicemail with name and callback number Marchia Bond RN Navigator Cardiac [facility] [phone] Office [phone] Cell  mychart message sent

## 2019-01-29 ENCOUNTER — Ambulatory Visit (HOSPITAL_COMMUNITY): Admission: RE | Admit: 2019-01-29 | Payer: BLUE CROSS/BLUE SHIELD | Source: Ambulatory Visit

## 2019-01-29 ENCOUNTER — Ambulatory Visit (HOSPITAL_COMMUNITY)
Admission: RE | Admit: 2019-01-29 | Discharge: 2019-01-29 | Disposition: A | Discharge status: Home / Self Care | Payer: BLUE CROSS/BLUE SHIELD | Source: Ambulatory Visit | Attending: Cardiovascular Disease | Admitting: Cardiovascular Disease

## 2019-01-29 ENCOUNTER — Other Ambulatory Visit: Payer: Self-pay

## 2019-01-29 DIAGNOSIS — I2510 Atherosclerotic heart disease of native coronary artery without angina pectoris: Secondary | ICD-10-CM | POA: Insufficient documentation

## 2019-01-29 DIAGNOSIS — E785 Hyperlipidemia, unspecified: Secondary | ICD-10-CM | POA: Diagnosis not present

## 2019-01-29 DIAGNOSIS — K219 Gastro-esophageal reflux disease without esophagitis: Secondary | ICD-10-CM | POA: Diagnosis not present

## 2019-01-29 DIAGNOSIS — Z8709 Personal history of other diseases of the respiratory system: Secondary | ICD-10-CM | POA: Diagnosis not present

## 2019-01-29 DIAGNOSIS — Z8546 Personal history of malignant neoplasm of prostate: Secondary | ICD-10-CM | POA: Insufficient documentation

## 2019-01-29 DIAGNOSIS — J9859 Other diseases of mediastinum, not elsewhere classified: Secondary | ICD-10-CM | POA: Diagnosis not present

## 2019-01-29 DIAGNOSIS — R079 Chest pain, unspecified: Secondary | ICD-10-CM | POA: Diagnosis not present

## 2019-01-29 DIAGNOSIS — D860 Sarcoidosis of lung: Secondary | ICD-10-CM | POA: Insufficient documentation

## 2019-01-29 MED ORDER — IOHEXOL 350 MG/ML SOLN
90.0000 mL | Freq: Once | INTRAVENOUS | Status: AC | PRN
  Administered 2019-01-29: 90 mL via INTRAVENOUS

## 2019-01-29 MED ORDER — NITROGLYCERIN 0.4 MG SL SUBL
SUBLINGUAL_TABLET | SUBLINGUAL | Status: AC
  Filled 2019-01-29: qty 2

## 2019-01-29 MED ORDER — NITROGLYCERIN 0.4 MG SL SUBL
0.8000 mg | SUBLINGUAL_TABLET | Freq: Once | SUBLINGUAL | Status: AC
  Administered 2019-01-29: 0.8 mg via SUBLINGUAL
  Filled 2019-01-29: qty 25

## 2019-02-02 DIAGNOSIS — C61 Malignant neoplasm of prostate: Secondary | ICD-10-CM | POA: Diagnosis not present

## 2019-02-11 DIAGNOSIS — C61 Malignant neoplasm of prostate: Secondary | ICD-10-CM | POA: Diagnosis not present

## 2019-02-25 ENCOUNTER — Telehealth: Payer: Self-pay

## 2019-02-25 NOTE — Telephone Encounter (Signed)
Link text to  pt today at 0915am for visit.

## 2019-02-25 NOTE — Telephone Encounter (Signed)
I called pt that his appt will be change to virtual video visit due to COVID 19. PT gave verbal consent to do video and to file insurance. I explain to pt we are trying to minimize patient and visitor  access to our office. Pt has iphone and [place] cellular access. I explain he will receive a text message with a link today and to not erase it. He will click the link 10 minutes prior to visit, type first and last name and join video with Dr.SEthi. PT verbalized understanding.

## 2019-03-02 ENCOUNTER — Ambulatory Visit (INDEPENDENT_AMBULATORY_CARE_PROVIDER_SITE_OTHER): Payer: BC Managed Care – PPO | Admitting: Neurology

## 2019-03-02 ENCOUNTER — Other Ambulatory Visit: Payer: Self-pay

## 2019-03-02 DIAGNOSIS — R202 Paresthesia of skin: Secondary | ICD-10-CM | POA: Diagnosis not present

## 2019-03-02 DIAGNOSIS — C61 Malignant neoplasm of prostate: Secondary | ICD-10-CM

## 2019-03-02 DIAGNOSIS — D696 Thrombocytopenia, unspecified: Secondary | ICD-10-CM | POA: Diagnosis not present

## 2019-03-02 MED ORDER — TOPIRAMATE 25 MG PO TABS: 50.0000 mg | ORAL_TABLET | Freq: Two times a day (BID) | ORAL | 3 refills | Status: DC

## 2019-03-02 NOTE — Progress Notes (Signed)
Virtual Visit via Video Note  I connected with Philip Hernandez on 03/02/19 at 11:00 AM EDT by a video enabled telemedicine application and verified that I am speaking with the correct person using two identifiers.  Location: Patient: at home Provider: at [place]   I discussed the limitations of evaluation and management by telemedicine and the availability of in person appointments. The patient expressed understanding and agreed to proceed.  This meeting was performed using [email or web address] app for audio and visual  History of Present Illness: Philip Hernandez is seen today for virtual video office follow-up visit following last visit with [name], nurse practitioner on 08/25/2018.  He states he is doing about the same.  He still has paresthesias in his feet off and on.  He has good days and bad days.  He remains on Topamax 25 mg twice daily which is tolerating well but he is states if not sure if it is doing anything.  He also remains on gabapentin 800 mg 4 times daily and he does not miss a dose.  He continues to have gait and balance difficulties but is able to catch himself and has not fallen or hurt himself.  He does not use a cane or walker.  He recently got handicap parking sticker renewed by me.  He has no new neurological complaints.   Observations/Objective: Physical and neurological exam is limited due to constraints of virtual video visit.  Pleasant middle-aged Caucasian male not in distress.  He is awake alert oriented to time place and person.  Speech and language appear normal.  No dysarthria or aphasia.  Extraocular movements are full range.  Face is symmetric.  Tongue is midline.  Motor system exam shows symmetric upper and lower extremity strength.  No focal weakness.  Assessment and Plan: 63 year old Caucasian male with longstanding bilateral extremity paresthesias secondary to small fiber painful peripheral neuropathy from sarcoidosis.  He continues to be symptomatic despite being on high  dose of gabapentin and now Topamax which was recently added I recommend increasing Topamax to 50 mg twice daily and continuing gabapentin 800 mg 4 times daily.  I discussed possible side effects with the patient advised him to call me if needed.  We will continue to increase Topamax further as tolerated.  He voiced understanding.  He will call me if symptoms worsen or he has side effects. Follow Up Instructions: Follow-up with [name], nurse practitioner in 6 months or call earlier if necessary   I discussed the assessment and treatment plan with the patient. The patient was provided an opportunity to ask questions and all were answered. The patient agreed with the plan and demonstrated an understanding of the instructions.   The patient was advised to call back or seek an in-person evaluation if the symptoms worsen or if the condition fails to improve as anticipated.  I provided 25 minutes of non-face-to-face time during this encounter.   Antony Contras, MD

## 2019-03-26 DIAGNOSIS — D1801 Hemangioma of skin and subcutaneous tissue: Secondary | ICD-10-CM | POA: Diagnosis not present

## 2019-03-26 DIAGNOSIS — L720 Epidermal cyst: Secondary | ICD-10-CM | POA: Diagnosis not present

## 2019-03-26 DIAGNOSIS — L819 Disorder of pigmentation, unspecified: Secondary | ICD-10-CM | POA: Diagnosis not present

## 2019-03-26 DIAGNOSIS — L821 Other seborrheic keratosis: Secondary | ICD-10-CM | POA: Diagnosis not present

## 2019-03-26 DIAGNOSIS — L570 Actinic keratosis: Secondary | ICD-10-CM | POA: Diagnosis not present

## 2019-03-26 DIAGNOSIS — L814 Other melanin hyperpigmentation: Secondary | ICD-10-CM | POA: Diagnosis not present

## 2019-03-26 DIAGNOSIS — I8393 Asymptomatic varicose veins of bilateral lower extremities: Secondary | ICD-10-CM | POA: Diagnosis not present

## 2019-03-26 DIAGNOSIS — L812 Freckles: Secondary | ICD-10-CM | POA: Diagnosis not present

## 2019-03-26 DIAGNOSIS — D229 Melanocytic nevi, unspecified: Secondary | ICD-10-CM | POA: Diagnosis not present

## 2019-03-26 DIAGNOSIS — Z85828 Personal history of other malignant neoplasm of skin: Secondary | ICD-10-CM | POA: Diagnosis not present

## 2019-04-23 ENCOUNTER — Ambulatory Visit (INDEPENDENT_AMBULATORY_CARE_PROVIDER_SITE_OTHER): Payer: BC Managed Care – PPO | Admitting: Family Medicine

## 2019-04-23 ENCOUNTER — Encounter: Payer: Self-pay | Admitting: Family Medicine

## 2019-04-23 DIAGNOSIS — H1033 Unspecified acute conjunctivitis, bilateral: Secondary | ICD-10-CM

## 2019-04-23 MED ORDER — TOBRAMYCIN-DEXAMETHASONE 0.3-0.1 % OP SUSP: OPHTHALMIC | 0 refills | Status: DC

## 2019-04-23 NOTE — Progress Notes (Signed)
    Subjective:    Patient ID: Philip Hernandez, male    DOB: 11/04/1955, 63 y.o.   MRN: [id number]   HPI: Philip Hernandez is a 63 y.o. male presenting for 2 weeks dry itchy eyes. Right eye mattered shut this AM. Purulent drainage intermittently through the day. Red for three days. Both eyes are burning and dry.    Depression screen [facility] 2/9 10/17/2018 08/06/2018 07/03/2018 04/01/2018 03/07/2018  Decreased Interest 0 0 0 - 0  Down, Depressed, Hopeless 0 0 0 0 0  PHQ - 2 Score 0 0 0 0 0  Some recent data might be hidden     Relevant past medical, surgical, family and social history reviewed and updated as indicated.  Interim medical history since our last visit reviewed. Allergies and medications reviewed and updated.  ROS:  Review of Systems  Constitutional: Negative for fever.  HENT: Negative for congestion.   Respiratory: Negative for cough.      Social History   Tobacco Use  Smoking Status Never Smoker  Smokeless Tobacco Never Used       Objective:     Wt Readings from Last 3 Encounters:  01/20/19 165 lb (74.8 kg)  01/05/19 172 lb 12.8 oz (78.4 kg)  12/16/18 177 lb (80.3 kg)     Exam deferred. Pt. [place] due to COVID 19. Phone visit performed.   Assessment & Plan:   1. Acute bacterial conjunctivitis of both eyes     Meds ordered this encounter  Medications  . tobramycin-dexamethasone (TOBRADEX) ophthalmic solution    Sig: Apply 1 drop in affected eye(s) every 2 hours for two days. Then every 4 hours for 5 days.    Dispense:  5 mL    Refill:  0    No orders of the defined types were placed in this encounter.     Diagnoses and all orders for this visit:  Acute bacterial conjunctivitis of both eyes  Other orders -     tobramycin-dexamethasone (TOBRADEX) ophthalmic solution; Apply 1 drop in affected eye(s) every 2 hours for two days. Then every 4 hours for 5 days.    Virtual Visit via telephone Note  I discussed the limitations, risks, security and  privacy concerns of performing an evaluation and management service by telephone and the availability of in person appointments. The patient was identified with two identifiers. Pt.expressed understanding and agreed to proceed. Pt. Is at home. Dr. Livia Snellen is in his office.  Follow Up Instructions:   I discussed the assessment and treatment plan with the patient. The patient was provided an opportunity to ask questions and all were answered. The patient agreed with the plan and demonstrated an understanding of the instructions.   The patient was advised to call back or seek an in-person evaluation if the symptoms worsen or if the condition fails to improve as anticipated.   Total minutes including chart review and phone contact time: 10   Follow up plan: No follow-ups on file.  Claretta Fraise, MD [place]

## 2019-04-24 ENCOUNTER — Encounter: Payer: Self-pay | Admitting: Gastroenterology

## 2019-04-29 ENCOUNTER — Telehealth: Payer: Self-pay | Admitting: Nurse Practitioner

## 2019-04-29 NOTE — Telephone Encounter (Signed)
Left message for patient regarding his appointment on 8/19 with Dr. Johnsie Cancel. I advised that this is a virtual clinic and asked patient to call back to confirm or reschedule

## 2019-05-05 NOTE — Progress Notes (Signed)
Virtual Visit via Video Note   This visit type was conducted due to national recommendations for restrictions regarding the COVID-19 Pandemic (e.g. social distancing) in an effort to limit this patient's exposure and mitigate transmission in our community.  Due to his co-morbid illnesses, this patient is at least at moderate risk for complications without adequate follow up.  This format is felt to be most appropriate for this patient at this time.  All issues noted in this document were discussed and addressed.  A limited physical exam was performed with this format.  Please refer to the patient's chart for his consent to telehealth for [facility].   Date:  05/06/2019   ID:  Philip Hernandez, DOB 05-17-1956, MRN [id number]  Patient Location: Home Provider Location: Office  PCP:  Dettinger, Fransisca Kaufmann, MD  Cardiologist:  Karma Greaser Electrophysiologist:  None   Evaluation Performed:  F/U visit  Chief Complaint:  Chest Pain   History of Present Illness:    Philip Hernandez is a 63 y.o. male referred by Dr Dettinger for chest pain First seen virtually 01/20/19. Reviewed his office note from 01/05/19 CRF;s HLD  Also has GERD. No documented CAD. Atypical SSCP. Dull achy pain center of chest. Going on for 6 months Positional worse with UE pushing and pulling ? Muscular. Concern that associated with dyspnea with exertion and going up stairs Has a history of ? Quiescent sarcoid as well as prostate cancer with normal PSA 12/30/18   CXR 01/05/19 NAD aortic atherosclerosis  Lab review shows LDL 102 12/30/18   F/U cardiac CTA benign with calcium score 3 isolated to proximal RCA 31 st percentile for age and sex No obstructive CAD and normal aortic root 3.3 cm  Done on 01/29/19  Bothered by paresthesias in feet Topamax and gabapentin not very helpful Sees Dr Leonie Man ? Peripheral Neuropathy from sarcoid  Seeing [facility] for Sarcoid soon Getting colon and EGD with [facility] soon   The patient does not have  symptoms concerning for COVID-19 infection (fever, chills, cough, or new shortness of breath).    Past Medical History:  Diagnosis Date  . Colon polyp   . Colon polyps 2015  . Cough    chronic sinus issues, GERD with poor dietary control and Pulmonary Sarcoid.   Marland Kitchen GERD (gastroesophageal reflux disease) Jan 2012   [name]. Bx proven.  Deve abd pain in Jan 2012 following high dose steroids for sarcoid. s/p EGD and improvement in symptoms following PPI  . Hypogonadism male   . Kidney stone   . Malignant neoplasm of prostate [facility]) dec 2010   [name]. s/p surgery dec 2010, hx of rnormal PSA  since then  . Other and unspecified mycoses   . Painful respiration   . Peripheral neuropathy   . Prostate cancer ([place])    reoccurence (getting radiation 01-14-17 start).  . Sarcoidosis dec 2011   stage 2 pulmonary, neck nodes, splenic granuloma  . Skin cancer   . Swelling, mass, or lump in head and neck    Past Surgical History:  Procedure Laterality Date  . HERNIA REPAIR  13 yrs ago  . LYMPH NODE BIOPSY  05/2010, 07/2010  . nasal polyps  1984  . NASAL SINUS SURGERY  2013  . PROSTATE SURGERY  2010   radiation 2018  . SHOULDER SURGERY Right 2009, 2010   x 2  . SKIN CANCER EXCISION  11/16/14     Current Meds  Medication Sig  . acetaminophen (  TYLENOL) 500 MG tablet Take 500 mg by mouth every 6 (six) hours as needed.  . cetirizine (ZYRTEC) 10 MG tablet Take 10 mg by mouth daily.  . cholecalciferol (VITAMIN D) 1000 UNITS tablet Take 1,000 Units by mouth daily.    . fluticasone (FLONASE) 50 MCG/ACT nasal spray Place 2 sprays into both nostrils daily.  Marland Kitchen gabapentin (NEURONTIN) 800 MG tablet TAKE 1 TABLET BY MOUTH 4  TIMES DAILY  . Multiple Vitamins-Minerals (AIRBORNE PO) Take by mouth daily.  . nitroGLYCERIN (NITROSTAT) 0.4 MG SL tablet Place 1 tablet (0.4 mg total) under the tongue every 5 (five) minutes as needed for chest pain.  Marland Kitchen omeprazole (PRILOSEC) 40 MG capsule Take 1 capsule  (40 mg total) by mouth daily.  . Sodium Chloride-Sodium Bicarb 1.57 g PACK Use as directed once every am  . tobramycin-dexamethasone (TOBRADEX) ophthalmic solution Apply 1 drop in affected eye(s) every 2 hours for two days. Then every 4 hours for 5 days.  Marland Kitchen topiramate (TOPAMAX) 25 MG tablet Take 2 tablets (50 mg total) by mouth 2 (two) times daily.     Allergies:   Corn-containing products   Social History   Tobacco Use  . Smoking status: Never Smoker  . Smokeless tobacco: Never Used  Substance Use Topics  . Alcohol use: No  . Drug use: No     Family Hx: The patient's family history includes Breast cancer (age of onset: 59) in his maternal aunt; Cancer in his brother, cousin, cousin, maternal aunt, mother, paternal uncle, paternal uncle, paternal uncle, paternal uncle, and paternal uncle; Colon cancer in his maternal aunt; Colon cancer (age of onset: 26) in his maternal aunt; Colon cancer (age of onset: 88) in his mother; Colon cancer (age of onset: 1) in his brother; Colon cancer (age of onset: 45) in his maternal aunt; Dementia in his father; Kidney cancer in his paternal uncle; Lung cancer in his paternal uncle and paternal uncle; Prostate cancer in his paternal uncle and paternal uncle; Prostate cancer (age of onset: 54) in his paternal uncle; Skin cancer in his brother, mother, and paternal aunt; Stomach cancer in his paternal grandmother; Stomach cancer (age of onset: 44) in his maternal uncle; Stomach cancer (age of onset: 58) in his maternal aunt; Stroke in his paternal grandfather.  ROS:   Please see the history of present illness.     All other systems reviewed and are negative.   Prior CV studies:   The following studies were reviewed today:  CXR, notes primary labs and ECG 01/05/19  Labs/Other Tests and Data Reviewed:    EKG:   SR poor R wave progression rate 62  Recent Labs: 12/30/2018: ALT 14; BUN 15; Creatinine, Ser 1.26; Hemoglobin 14.4; Platelets 150; Potassium  4.5; Sodium 145   Recent Lipid Panel Lab Results  Component Value Date/Time   CHOL 165 12/30/2018 02:25 PM   CHOL 150 03/26/2013 12:20 PM   TRIG 123 12/30/2018 02:25 PM   TRIG 89 05/25/2014 09:20 AM   TRIG 172 (H) 03/26/2013 12:20 PM   HDL 38 (L) 12/30/2018 02:25 PM   HDL 36 (L) 05/25/2014 09:20 AM   HDL 27 (L) 03/26/2013 12:20 PM   CHOLHDL 4.3 12/30/2018 02:25 PM   LDLCALC 102 (H) 12/30/2018 02:25 PM   LDLCALC 86 05/25/2014 09:20 AM   LDLCALC 89 03/26/2013 12:20 PM    Wt Readings from Last 3 Encounters:  05/06/19 170 lb (77.1 kg)  01/20/19 165 lb (74.8 kg)  01/05/19 172 lb 12.8  oz (78.4 kg)     Objective:    Vital Signs:  BP 113/69   Pulse 62   Ht 5\' 10"  (1.778 m)   Wt 170 lb (77.1 kg)   BMI 24.39 kg/m    No distress No tachpnea No JVP elevation  Skin warm and dry No edema  ASSESSMENT & PLAN:    1. Chest Pain:  Benign cardiac CTA May 2020 no obstructive CAD and low calcium score  2. HLD:  Given aortic atherosclerosis on CXR And SSCP favor low dose statin Rx    3. Sarcoid:  History of suggest primary order chest CT to further evaluate CXR did not comment on mediastinal adenopathy see above F/U with Dr Leonie Man for peripheral neuropathy  4. Prostate cancer.  PSA Less than 1 12/30/18 f/u urology [place] patient indicates PSA was higher in February when [facility] checked it He is post prostatectomy   COVID-19 Education: The signs and symptoms of COVID-19 were discussed with the patient and how to seek care for testing (follow up with PCP or arrange E-visit).  The importance of social distancing was discussed today.  Time:   Today, I have spent 30 minutes with the patient with telehealth technology discussing the above problems.     Medication Adjustments/Labs and Tests Ordered: Current medicines are reviewed at length with the patient today.  Concerns regarding medicines are outlined above.   Tests Ordered: No orders of the defined types were placed in this encounter.    Medication Changes: No orders of the defined types were placed in this encounter.   Disposition:  Follow up PRN   Signed, Jenkins Rouge, MD  05/06/2019 9:22 AM    [place]

## 2019-05-06 ENCOUNTER — Encounter: Payer: Self-pay | Admitting: Cardiovascular Disease

## 2019-05-06 ENCOUNTER — Telehealth (INDEPENDENT_AMBULATORY_CARE_PROVIDER_SITE_OTHER): Payer: BC Managed Care – PPO | Admitting: Cardiovascular Disease

## 2019-05-06 ENCOUNTER — Other Ambulatory Visit: Payer: Self-pay

## 2019-05-06 VITALS — BP 113/69 | HR 62 | Ht 70.0 in | Wt 170.0 lb

## 2019-05-06 DIAGNOSIS — D869 Sarcoidosis, unspecified: Secondary | ICD-10-CM | POA: Diagnosis not present

## 2019-05-06 DIAGNOSIS — E785 Hyperlipidemia, unspecified: Secondary | ICD-10-CM | POA: Diagnosis not present

## 2019-05-06 DIAGNOSIS — R079 Chest pain, unspecified: Secondary | ICD-10-CM | POA: Diagnosis not present

## 2019-05-06 NOTE — Patient Instructions (Signed)
Your physician recommends that you continue on your current medications as directed. Please refer to the Current Medication list given to you today.    Your physician recommends that you schedule a follow-up appointment in:  AS NEEDED 

## 2019-05-07 ENCOUNTER — Ambulatory Visit (INDEPENDENT_AMBULATORY_CARE_PROVIDER_SITE_OTHER): Payer: BC Managed Care – PPO | Admitting: Family Medicine

## 2019-05-07 ENCOUNTER — Encounter: Payer: Self-pay | Admitting: Family Medicine

## 2019-05-07 VITALS — BP 118/76 | HR 59 | Temp 94.8°F | Ht 70.0 in | Wt 177.4 lb

## 2019-05-07 DIAGNOSIS — D869 Sarcoidosis, unspecified: Secondary | ICD-10-CM

## 2019-05-07 DIAGNOSIS — R590 Localized enlarged lymph nodes: Secondary | ICD-10-CM

## 2019-05-07 DIAGNOSIS — K219 Gastro-esophageal reflux disease without esophagitis: Secondary | ICD-10-CM

## 2019-05-07 DIAGNOSIS — E782 Mixed hyperlipidemia: Secondary | ICD-10-CM

## 2019-05-07 NOTE — Addendum Note (Signed)
Addended by: Nigel Berthold C on: 05/07/2019 11:17 AM   Modules accepted: Orders

## 2019-05-07 NOTE — Progress Notes (Signed)
BP 118/76   Pulse (!) 59   Temp (!) 94.8 F (34.9 C) (Temporal)   Ht '5\' 10"'$  (1.778 m)   Wt 177 lb 6.4 oz (80.5 kg)   BMI 25.45 kg/m    Subjective:   Patient ID: Philip Hernandez, male    DOB: 1956-04-26, 63 y.o.   MRN: [id number]  HPI: Philip Hernandez is a 63 y.o. male presenting on 05/07/2019 for Hyperlipidemia (4 month follow up)   HPI Hyperlipidemia Patient is coming in for recheck of his hyperlipidemia. The patient is currently taking no medication currently has been diet controlled and will recheck today. They deny any issues with myalgias or history of liver damage from it. They deny any focal numbness or weakness or chest pain.   GERD Patient is currently on omeprazole but does not take it consistently, is planning on going to see gastroenterology and discuss his chest pressure with them and possible EGD.Marland Kitchen  She denies any major symptoms or abdominal pain or belching or burping. She denies any blood in her stool or lightheadedness or dizziness.   Patient has a history of sarcoidosis and he has been complaining of this chest pressure and tightness in the center of his chest.  He recently had a cardiac CT but that did show some mediastinal adenopathy and they wanted him to follow-up with a possible chest CT to look for the sarcoidosis.  His cardiologist said he did not see any signs that the chest pressure or tightness was coming from his heart.  Patient says he experiences the chest pressure or tightness most days but especially at rest when he is not doing anything.  He says he has a minimal amount of shortness of breath associated with it.  He says he does not notice it when he is active but more when he is just at rest.  Relevant past medical, surgical, family and social history reviewed and updated as indicated. Interim medical history since our last visit reviewed. Allergies and medications reviewed and updated.  Review of Systems  Constitutional: Negative for chills and fever.   HENT: Negative for congestion, sinus pressure, sneezing and sore throat.   Respiratory: Positive for chest tightness and shortness of breath. Negative for wheezing.   Cardiovascular: Positive for chest pain. Negative for palpitations and leg swelling.  Gastrointestinal: Negative for abdominal pain, constipation, diarrhea, nausea and vomiting.  Musculoskeletal: Negative for back pain and gait problem.  Skin: Negative for rash.  Neurological: Negative for dizziness, weakness and light-headedness.  All other systems reviewed and are negative.   Per HPI unless specifically indicated above   Allergies as of 05/07/2019      Reactions   Corn-containing Products       Medication List       Accurate as of May 07, 2019 11:14 AM. If you have any questions, ask your nurse or doctor.        STOP taking these medications   tobramycin-dexamethasone ophthalmic solution Commonly known as: TobraDex Stopped by: Worthy Rancher, MD   topiramate 25 MG tablet Commonly known as: TOPAMAX Stopped by: Worthy Rancher, MD     TAKE these medications   acetaminophen 500 MG tablet Commonly known as: TYLENOL Take 500 mg by mouth every 6 (six) hours as needed.   AIRBORNE PO Take by mouth daily.   cetirizine 10 MG tablet Commonly known as: ZYRTEC Take 10 mg by mouth daily.   cholecalciferol 1000 units tablet Commonly known as: VITAMIN D Take 1,000  Units by mouth daily.   fluticasone 50 MCG/ACT nasal spray Commonly known as: FLONASE Place 2 sprays into both nostrils daily.   gabapentin 800 MG tablet Commonly known as: NEURONTIN TAKE 1 TABLET BY MOUTH 4  TIMES DAILY   nitroGLYCERIN 0.4 MG SL tablet Commonly known as: NITROSTAT Place 1 tablet (0.4 mg total) under the tongue every 5 (five) minutes as needed for chest pain.   omeprazole 40 MG capsule Commonly known as: PRILOSEC Take 1 capsule (40 mg total) by mouth daily.   Sodium Chloride-Sodium Bicarb 1.57 g Pack Use as  directed once every am        Objective:   BP 118/76   Pulse (!) 59   Temp (!) 94.8 F (34.9 C) (Temporal)   Ht '5\' 10"'$  (1.778 m)   Wt 177 lb 6.4 oz (80.5 kg)   BMI 25.45 kg/m   Wt Readings from Last 3 Encounters:  05/07/19 177 lb 6.4 oz (80.5 kg)  05/06/19 170 lb (77.1 kg)  01/20/19 165 lb (74.8 kg)    Physical Exam Vitals signs and nursing note reviewed.  Constitutional:      General: He is not in acute distress.    Appearance: He is well-developed. He is not diaphoretic.  Eyes:     General: No scleral icterus.    Conjunctiva/sclera: Conjunctivae normal.  Neck:     Musculoskeletal: Neck supple.     Thyroid: No thyromegaly.  Cardiovascular:     Rate and Rhythm: Normal rate and regular rhythm.     Heart sounds: Normal heart sounds. No murmur.  Pulmonary:     Effort: Pulmonary effort is normal. No respiratory distress.     Breath sounds: Normal breath sounds. No wheezing.  Musculoskeletal: Normal range of motion.  Lymphadenopathy:     Cervical: No cervical adenopathy.  Skin:    General: Skin is warm and dry.     Findings: No rash.  Neurological:     Mental Status: He is alert and oriented to person, place, and time.     Coordination: Coordination normal.  Psychiatric:        Behavior: Behavior normal.       Assessment & Plan:   Problem List Items Addressed This Visit      Digestive   GERD   Relevant Orders   CMP14+EGFR   CBC with Differential/Platelet     Other   Sarcoidosis   Relevant Orders   CT Chest W Contrast   TSH   HLD (hyperlipidemia) - Primary   Relevant Orders   Lipid panel   TSH    Other Visit Diagnoses    Mediastinal adenopathy       Relevant Orders   TSH      Has appt with gi already. Patient has chest pain and pressure that has been there for some time off and on, he notices more at rest.   Patient had cardiac ct calcium score that shows mediastinal adenopathy Follow up plan: Return in about 6 months (around 11/07/2019),  or if symptoms worsen or fail to improve, for hld.  Counseling provided for all of the vaccine components Orders Placed This Encounter  Procedures  . CT Chest W Contrast  . CMP14+EGFR  . CBC with Differential/Platelet  . Lipid panel  . TSH    [name], MD [facility] 05/07/2019, 11:14 AM

## 2019-05-08 LAB — LIPID PANEL
Chol/HDL Ratio: 4.5 ratio (ref 0.0–5.0)
Cholesterol, Total: 181 mg/dL (ref 100–199)
HDL: 40 mg/dL (ref >39)
LDL Calculated: 106 mg/dL — ABNORMAL HIGH (ref 0–99)
Triglycerides: 174 mg/dL — ABNORMAL HIGH (ref 0–149)
VLDL Cholesterol Cal: 35 mg/dL (ref 5–40)

## 2019-05-08 LAB — CBC WITH DIFFERENTIAL/PLATELET
Basophils Absolute: 0.1 10*3/uL (ref 0.0–0.2)
Basos: 1 %
EOS (ABSOLUTE): 0.3 10*3/uL (ref 0.0–0.4)
Eos: 5 %
Hematocrit: 42.2 % (ref 37.5–51.0)
Hemoglobin: 14.1 g/dL (ref 13.0–17.7)
Immature Grans (Abs): 0 10*3/uL (ref 0.0–0.1)
Immature Granulocytes: 0 %
Lymphocytes Absolute: 1.8 10*3/uL (ref 0.7–3.1)
Lymphs: 25 %
MCH: 28.7 pg (ref 26.6–33.0)
MCHC: 33.4 g/dL (ref 31.5–35.7)
MCV: 86 fL (ref 79–97)
Monocytes Absolute: 0.9 10*3/uL (ref 0.1–0.9)
Monocytes: 12 %
Neutrophils Absolute: 4.1 10*3/uL (ref 1.4–7.0)
Neutrophils: 57 %
Platelets: 148 10*3/uL — ABNORMAL LOW (ref 150–450)
RBC: 4.91 x10E6/uL (ref 4.14–5.80)
RDW: 13 % (ref 11.6–15.4)
WBC: 7.2 10*3/uL (ref 3.4–10.8)

## 2019-05-08 LAB — CMP14+EGFR
ALT: 15 IU/L (ref 0–44)
AST: 14 IU/L (ref 0–40)
Albumin/Globulin Ratio: 2.6 — ABNORMAL HIGH (ref 1.2–2.2)
Albumin: 4.4 g/dL (ref 3.8–4.8)
Alkaline Phosphatase: 71 IU/L (ref 39–117)
BUN/Creatinine Ratio: 11 (ref 10–24)
BUN: 13 mg/dL (ref 8–27)
Bilirubin Total: 0.7 mg/dL (ref 0.0–1.2)
CO2: 26 mmol/L (ref 20–29)
Calcium: 9.8 mg/dL (ref 8.6–10.2)
Chloride: 104 mmol/L (ref 96–106)
Creatinine, Ser: 1.23 mg/dL (ref 0.76–1.27)
GFR calc Af Amer: 72 mL/min/{1.73_m2} (ref >59)
GFR calc non Af Amer: 63 mL/min/{1.73_m2} (ref >59)
Globulin, Total: 1.7 g/dL (ref 1.5–4.5)
Glucose: 76 mg/dL (ref 65–99)
Potassium: 5.4 mmol/L — ABNORMAL HIGH (ref 3.5–5.2)
Sodium: 143 mmol/L (ref 134–144)
Total Protein: 6.1 g/dL (ref 6.0–8.5)

## 2019-05-08 LAB — TSH: TSH: 2.54 u[IU]/mL (ref 0.450–4.500)

## 2019-05-12 ENCOUNTER — Encounter: Payer: Self-pay | Admitting: Internal Medicine

## 2019-05-12 ENCOUNTER — Other Ambulatory Visit: Payer: Self-pay

## 2019-05-12 ENCOUNTER — Ambulatory Visit (INDEPENDENT_AMBULATORY_CARE_PROVIDER_SITE_OTHER): Payer: BC Managed Care – PPO | Admitting: Internal Medicine

## 2019-05-12 DIAGNOSIS — R0600 Dyspnea, unspecified: Secondary | ICD-10-CM

## 2019-05-12 DIAGNOSIS — D869 Sarcoidosis, unspecified: Secondary | ICD-10-CM | POA: Diagnosis not present

## 2019-05-12 DIAGNOSIS — K219 Gastro-esophageal reflux disease without esophagitis: Secondary | ICD-10-CM | POA: Diagnosis not present

## 2019-05-12 DIAGNOSIS — R0609 Other forms of dyspnea: Secondary | ICD-10-CM | POA: Diagnosis not present

## 2019-05-12 MED ORDER — OMEPRAZOLE 40 MG PO CPDR: DELAYED_RELEASE_CAPSULE | ORAL | 3 refills | Status: DC

## 2019-05-12 NOTE — Progress Notes (Addendum)
Philip Hernandez, male    DOB: 13-Mar-1956,    MRN: [id number]   Brief patient profile:  9 yowm never smoker initial eval by [name] 2011 for cough dx sarcoid by nasal bx  then referred to me on 10/11/11 for refractory cough and seen last 03/23/13 off prednsione x months  with rec to continue elavil and f/u prn.    Aug 09, 2010: ENT repeat biopsys: non-necrotizing granuloma (PCR negative for AFB and fungal stains negative) Sep 02, 2010: Started prednisone 60mg  per day for Dx of SARCOIDOSIS with improvement in cough Early Jan 2012: Reduced prednisone to 20mg  per day PFts 12/04/2010: FVC 4.1L/80%, fev1 2.8L/76%, TLC 79%, DLCO 23.5/91% December 18, 2010: REduce prednisone to 10mg  per day 04/23/2011: No change in PFTs. Suspect cough is due to LPR and not sarcoid -> cut prednisone down to 5mg   07/18/11: PFT normal. Continue prednisone 01/22/2012 Reduce prednsione to 5 mg per day > reduce to 2.5 mg per day 03/06/2012  03/06/12 PFT's wnl > try qod effective 06/17/2012 > d/c 'd prednisone 12/09/12 s flare 03/23/2013 PFT's  wnl off all inhalers DLCO 64  Corrects to 84       History of Present Illness  05/12/2019  Pulmonary/ 1st office eval/Sherman Lipuma  New sob/cp Chief Complaint  Patient presents with  . Pulmonary Consult    Self referral- seen in the past for sarcoid. He c/o occ CP x 9 months.   New onset Dec 2019 daily, middle of chest 10 sec - to sev hours ?  Not necessarily proportionate as has it at rest > cards eval with neg CT coronary 01/29/19 Dyspnea:  New since pain, only with hills = MMRC1 Cough: no real cough but new sense of globus and dysphagia assoc with sob and cp   Sleep: no resp symptoms  or cp  SABA use: none  No obvious day to day or daytime variability or assoc excess/ purulent sputum or mucus plugs or hemoptysis or cp or chest tightness, subjective wheeze or overt sinus or hb symptoms.   sleeping without nocturnal  or early am exacerbation  of respiratory  c/o's or need for noct saba. Also  denies any obvious fluctuation of symptoms with weather or environmental changes or other aggravating or alleviating factors except as outlined above   No unusual exposure hx or h/o childhood pna/ asthma or knowledge of premature birth.  Current Allergies, Complete Past Medical History, Past Surgical History, Family History, and Social History were reviewed in [employer] record.  ROS  The following are not active complaints unless bolded Hoarseness, sore throat, dysphagia, dental problems, itching, sneezing,  nasal congestion or discharge of excess mucus or purulent secretions, ear ache,   fever, chills, sweats, unintended wt loss or wt gain, classically pleuritic or exertional cp,  orthopnea pnd or arm/hand swelling  or leg swelling, presyncope, palpitations, abdominal pain, anorexia, nausea, vomiting, diarrhea  or change in bowel habits or change in bladder habits, change in stools or change in urine, dysuria, hematuria,  rash, arthralgias, visual complaints, headache, numbness, weakness or ataxia or problems with walking or coordination,  change in mood or  Memory. Painful paresthesias               Past Medical History:  Diagnosis Date  . Colon polyp   . Colon polyps 2015  . Cough    chronic sinus issues, GERD with poor dietary control and Pulmonary Sarcoid.   Marland Kitchen GERD (gastroesophageal reflux disease) Jan 2012  [name]. Bx proven.  Deve abd pain in Jan 2012 following high dose steroids for sarcoid. s/p EGD and improvement in symptoms following PPI  . Hypogonadism male   . Kidney stone   . Malignant neoplasm of prostate [facility]) dec 2010   [name]. s/p surgery dec 2010, hx of rnormal PSA  since then  . Other and unspecified mycoses   . Painful respiration   . Peripheral neuropathy   . Prostate cancer ([place])    reoccurence (getting radiation 01-14-17 start).  . Sarcoidosis dec 2011   stage 2 pulmonary, neck nodes, splenic granuloma  . Skin cancer   .  Swelling, mass, or lump in head and neck     Outpatient Medications Prior to Visit  Medication Sig Dispense Refill  . acetaminophen (TYLENOL) 500 MG tablet Take 500 mg by mouth every 6 (six) hours as needed.    . cetirizine (ZYRTEC) 10 MG tablet Take 10 mg by mouth daily.    . cholecalciferol (VITAMIN D) 1000 UNITS tablet Take 1,000 Units by mouth daily.      . fluticasone (FLONASE) 50 MCG/ACT nasal spray Place 2 sprays into both nostrils daily. 16 g 6  . gabapentin (NEURONTIN) 800 MG tablet TAKE 1 TABLET BY MOUTH 4  TIMES DAILY 360 tablet 3  . Multiple Vitamins-Minerals (AIRBORNE PO) Take by mouth daily.    Marland Kitchen omeprazole (PRILOSEC) 40 MG capsule Take 1 capsule (40 mg total) by mouth daily. 90 capsule 3  . topiramate (TOPAMAX) 25 MG tablet Take 50 mg by mouth 2 (two) times daily.        Objective:     BP 112/68 (BP Location: Left Arm, Cuff Size: Normal)   Pulse (!) 55   Temp 98.8 F (37.1 C) (Oral)   Ht 5\' 10"  (1.778 m)   Wt 175 lb 12.8 oz (79.7 kg)   SpO2 98% Comment: on RA  BMI 25.22 kg/m   SpO2: 98 %(on RA)   amb wm nad   HEENT : pt wearing mask not removed for exam due to covid - 19 concerns.    NECK :  without JVD/Nodes/TM/ nl carotid upstrokes bilaterally   LUNGS: no acc muscle use,  Nl contour chest which is clear to A and P bilaterally without cough on insp or exp maneuvers   CV:  RRR  no s3 or murmur or increase in P2, and no edema   ABD:  soft and nontender with nl inspiratory excursion in the supine position. No bruits or organomegaly appreciated, bowel sounds nl  MS:  Nl gait/ ext warm without deformities, calf tenderness, cyanosis or clubbing No obvious joint restrictions   SKIN: warm and dry without lesions    NEURO:  alert, approp, nl sensorium with  no motor or cerebellar deficits apparent.       I personally reviewed images and agree with radiology impression as follows:  CXR:   01/05/19 wnl   I personally reviewed images and agree with  radiology impression as follows:   Chest CT 01/29/19 Mediastinal lymph nodes: Small mediastinal lymph nodes with calcifications. Bilateral hilar calcifications. Findings are similar to the previous examination and compatible with history of sarcoidosis. Lungs: No large pleural effusions. Visualized lungs are clear. Probable punctate calcified granuloma in the right lower lobe on sequence 12, image 16.   Labs   Reviewed: at [facility] 05/12/2019      Chemistry      Component Value Date/Time   NA 143 05/07/2019 1109  K 5.4 (H) 05/07/2019 1109   CL 104 05/07/2019 1109   CO2 26 05/07/2019 1109   BUN 13 05/07/2019 1109   CREATININE 1.23 05/07/2019 1109   CREATININE 1.15 03/26/2013 1220    alb/prot ratio nl 05/07/19   Component Value Date/Time   CALCIUM 9.8 05/07/2019 1109   ALKPHOS 71 05/07/2019 1109   AST 14 05/07/2019 1109   ALT 15 05/07/2019 1109   BILITOT 0.7 05/07/2019 1109        Lab Results  Component Value Date   WBC 7.2 05/07/2019   HGB 14.1 05/07/2019   HCT 42.2 05/07/2019   MCV 86 05/07/2019   PLT 148 (L) 05/07/2019                                                     Lab Results  Component Value Date   TSH 2.540 05/07/2019     Lab Results  Component Value Date   PROBNP 22.0 12/10/2011       Lab Results  Component Value Date   ESRSEDRATE 30 (H) 12/10/2011   ESRSEDRATE 14 07/10/2010           Assessment   DOE (dyspnea on exertion) Onset 2011 Due to stage 2 pulmonary sarcoid and deconditioning New onset Feb 2011. Class 2-3 (any activity, climbing flight of stairs or walking block) No improvement despite sarcoid dx in dec 2011 and starting sterpoids PFT March 2012: Mild ffev1 reduction fev1 2.8L/76% with ratio 69%. Normal DLCO. TLC 79% April 2012: referred for pulmonary rehab June 2012: somewhat better after startng rehab Aug 2012: rehab not helping. PFT no change since march - mild restriction only. Stop rehab - Cardiac CT neg 01/29/2019  -  05/12/2019   Walked RA  3 laps @  approx 233ft each @ rapid pace  stopped due to end of study, min sob with sats 98% at end   Symptoms of doe and variably assoc cp remain unexplained to date by  objective findings and not clear to what extent this is actually a pulmonary  problem but pt does appear to have difficult to sort out respiratory symptoms of unknown origin for which  DDX  = almost all start with A and  include Adherence, Ace Inhibitors, Acid Reflux, Active Sinus Disease, Alpha 1 Antitripsin deficiency, Anxiety masquerading as Airways dz,  ABPA,  Allergy(esp in young), Aspiration (esp in elderly), Adverse effects of meds,  Active smoking or Vaping, A bunch of PE's/clot burden (a few small clots can't cause this syndrome unless there is already severe underlying pulm or vascular dz with poor reserve),  Anemia or thyroid disorder, plus two Bs  = Bronchiectasis and Beta blocker use..and one C= CHF    Adherence is always the initial "prime suspect" and is a multilayered concern that requires a "trust but verify" approach in every patient - starting with knowing how to use medications, especially inhalers, correctly, keeping up with refills and understanding the fundamental difference between maintenance and prns vs those medications only taken for a very short course and then stopped and not refilled.   ? Acid (or non-acid) GERD > always difficult to exclude as up to 75% of pts in some series report no assoc GI/ Heartburn symptoms and he has assoc dysphagia > rec max (24h)  acid suppression and diet restrictions/ reviewed and  instructions given in writing.   ? Asthma/ airways involvement with sarcoid - A good rule of thumb is that >95% of pts with active sarcoid in any organ will have some plain cxr changes - on the other hand  if there are active pulmonary symptoms the cxr will look much worse than the patient:  No evidence of either scenario here/ strongly doubt active dz  > no evidence of airways dz  here and ct chest not supportive of parenchymal involvement either  ? Anxiety > usually at the bottom of this list of usual suspects but should be much higher on this pt's based on H and P (and note had previously required  Psychotropics for unexplained cough and sob)  and may interfere with adherence and also interpretation of response or lack thereof to symptom management which can be quite subjective > defer rx to PCP ? If restarting elavil might also help his neuropathy?   ? Active sinus dz > unlikely s more rhinitis symptoms or cough   ? Anemia/ thyroid dz > already ruled out  ? CHF/ ihd> also recently excluded    >>>> rec max rx for gerd, reconditioning ex and f/u with cpst if not satisfied improving p one month rx.    Sarcoidosis No evidence at all to support any active pulmonary or systemic dz  >>> no pulmonary f/u needed     GERD Assoc with dysphagia so if this does not improve on rx needs EGD when schedules his colonoscopy later this year / advised  Pt with message to Dr Havery Moros.      Total time devoted to counseling  > 50 % of initial 60 min office visit:  reviewed case with pt/ directly observed portions of ambulatory 02 saturation study/  discussion of options/alternatives/ personally creating written customized instructions  in presence of pt  then going over those specific  Instructions directly with the pt including how to use all of the meds but in particular covering each new medication in detail and the difference between the maintenance= "automatic" meds and the prns using an action plan format for the latter (If this problem/symptom => do that organization reading Left to right).  Please see AVS from this visit for a full list of these instructions which I personally wrote for this pt and  are unique to this visit.    Christinia Gully, MD 05/12/2019

## 2019-05-12 NOTE — Patient Instructions (Addendum)
Change prilosec to 40 mg Take 30- 60 min before your first and last meals of the day    GERD (REFLUX)  is an extremely common cause of respiratory symptoms just like yours , many times with no obvious heartburn at all.    It can be treated with medication, but also with lifestyle changes including elevation of the head of your bed (ideally with 6 -8inch blocks under the headboard of your bed),  Smoking cessation, avoidance of late meals, excessive alcohol, and avoid fatty foods, chocolate, peppermint, colas, red wine, and acidic juices such as orange juice.  NO MINT OR MENTHOL PRODUCTS SO NO COUGH DROPS  USE SUGARLESS CANDY INSTEAD (Jolley ranchers or Stover's or Life Savers) or even ice chips will also do - the key is to swallow to prevent all throat clearing. NO OIL BASED VITAMINS - use powdered substitutes.  Avoid fish oil when coughing.   If swallowing not better then need to see your GI doctor  Weight control is simply a matter of calorie balance which needs to be tilted in your favor by eating less and exercising more.  To get the most out of exercise, you need to be continuously aware that you are short of breath, but never out of breath, for 30 minutes daily. As you improve, it will actually be easier for you to do the same amount of exercise  in  30 minutes so always push to the level where you are short of breath.    If breathing and chest discomfort don't improve >   CPST next step - call to schedule

## 2019-05-13 ENCOUNTER — Encounter: Payer: Self-pay | Admitting: Internal Medicine

## 2019-05-13 NOTE — Assessment & Plan Note (Addendum)
Onset 2011 Due to stage 2 pulmonary sarcoid and deconditioning New onset Feb 2011. Class 2-3 (any activity, climbing flight of stairs or walking block) No improvement despite sarcoid dx in dec 2011 and starting sterpoids PFT March 2012: Mild ffev1 reduction fev1 2.8L/76% with ratio 69%. Normal DLCO. TLC 79% April 2012: referred for pulmonary rehab June 2012: somewhat better after startng rehab Aug 2012: rehab not helping. PFT no change since march - mild restriction only. Stop rehab - Cardiac CT neg 01/29/2019  - 05/12/2019   Walked RA  3 laps @  approx 272ft each @ rapid pace  stopped due to end of study, min sob with sats 98% at end   Symptoms of doe and variably assoc cp remain unexplained to date by  objective findings and not clear to what extent this is actually a pulmonary  problem but pt does appear to have difficult to sort out respiratory symptoms of unknown origin for which  DDX  = almost all start with A and  include Adherence, Ace Inhibitors, Acid Reflux, Active Sinus Disease, Alpha 1 Antitripsin deficiency, Anxiety masquerading as Airways dz,  ABPA,  Allergy(esp in young), Aspiration (esp in elderly), Adverse effects of meds,  Active smoking or Vaping, A bunch of PE's/clot burden (a few small clots can't cause this syndrome unless there is already severe underlying pulm or vascular dz with poor reserve),  Anemia or thyroid disorder, plus two Bs  = Bronchiectasis and Beta blocker use..and one C= CHF    Adherence is always the initial "prime suspect" and is a multilayered concern that requires a "trust but verify" approach in every patient - starting with knowing how to use medications, especially inhalers, correctly, keeping up with refills and understanding the fundamental difference between maintenance and prns vs those medications only taken for a very short course and then stopped and not refilled.   ? Acid (or non-acid) GERD > always difficult to exclude as up to 75% of pts in some  series report no assoc GI/ Heartburn symptoms and he has assoc dysphagia > rec max (24h)  acid suppression and diet restrictions/ reviewed and instructions given in writing.   ? Asthma/ airways involvement with sarcoid - A good rule of thumb is that >95% of pts with active sarcoid in any organ will have some plain cxr changes - on the other hand  if there are active pulmonary symptoms the cxr will look much worse than the patient:  No evidence of either scenario here/ strongly doubt active dz  > no evidence of airways dz here and ct chest not supportive of parenchymal involvement either  ? Anxiety > usually at the bottom of this list of usual suspects but should be much higher on this pt's based on H and P (and note had previously required  Psychotropics for unexplained cough and sob)  and may interfere with adherence and also interpretation of response or lack thereof to symptom management which can be quite subjective > defer rx to PCP ? If restarting elavil might also help his neuropathy?   ? Active sinus dz > unlikely s more rhinitis symptoms or cough   ? Anemia/ thyroid dz > already ruled out  ? CHF/ ihd> also recently excluded    >>>> rec max rx for gerd, reconditioning ex and f/u with cpst if not satisfied improving p one month rx.

## 2019-05-13 NOTE — Assessment & Plan Note (Addendum)
Assoc with dysphagia so if this does not improve on rx needs EGD when schedules his colonoscopy later this year / advised  Pt with message to DR armbruster.   Total time devoted to counseling  > 50 % of initial 60 min office visit:  reviewed case with pt/ directly observed portions of ambulatory 02 saturation study/  discussion of options/alternatives/ personally creating written customized instructions  in presence of pt  then going over those specific  Instructions directly with the pt including how to use all of the meds but in particular covering each new medication in detail and the difference between the maintenance= "automatic" meds and the prns using an action plan format for the latter (If this problem/symptom => do that organization reading Left to right).  Please see AVS from this visit for a full list of these instructions which I personally wrote for this pt and  are unique to this visit.

## 2019-05-13 NOTE — Assessment & Plan Note (Addendum)
No evidence at all to support any active pulmonary or systemic dz   >>> no pulmonary f/u needed

## 2019-05-14 ENCOUNTER — Telehealth: Payer: Self-pay

## 2019-05-14 NOTE — Telephone Encounter (Signed)
Patient OK to move procedure to 3:00pm on 9-18. Understands that will give him a "light breakfast" the day before. He has a previsit on 9-4.  Confirmed appt

## 2019-05-14 NOTE — Telephone Encounter (Signed)
Called pt. Lm to see if he could move his procedure  to 3:00pm on the same day, 9-18 (instead of 10:30am when he is currently scheduled) in order to do double, ECL.  Held spot until I hear back from him.

## 2019-05-14 NOTE — Telephone Encounter (Signed)
Patient states that Dr. Melvyn Novas said patient does not need another CT. Patient states that you told him he needed another one before he seen Dr.Wert but he seen the Dr. Melvyn Novas first and states he does not need it.  Wanted to let you know and states he is canceling the CT.

## 2019-05-14 NOTE — Telephone Encounter (Signed)
Okay sounds good 

## 2019-05-14 NOTE — Telephone Encounter (Signed)
-----   Message from Yetta Flock, MD sent at 05/13/2019  6:35 PM EDT ----- Mary Sella can you help book this patient for an EGD at the same time as his colonoscopy, scheduled 9/18. Thanks much ----- Message ----- From: Tanda Rockers, MD Sent: 05/13/2019   5:42 AM EDT To: Yetta Flock, MD  He has atypical cp (neg cards w/u, no pulmonary dz) and dysphagia and scheduled to see you 05/22/2019 for colonscopy previsit  I placed him on max gerd rx/ diet but though you'd want a heads up in case you needed to consider EGD at same time  thx

## 2019-05-21 ENCOUNTER — Ambulatory Visit (HOSPITAL_COMMUNITY): Payer: BC Managed Care – PPO

## 2019-05-22 ENCOUNTER — Other Ambulatory Visit: Payer: Self-pay

## 2019-05-22 ENCOUNTER — Ambulatory Visit (AMBULATORY_SURGERY_CENTER): Payer: Self-pay

## 2019-05-22 ENCOUNTER — Telehealth: Payer: Self-pay | Admitting: Gastroenterology

## 2019-05-22 VITALS — Ht 70.0 in | Wt 177.0 lb

## 2019-05-22 DIAGNOSIS — Z800 Family history of malignant neoplasm of digestive organs: Secondary | ICD-10-CM

## 2019-05-22 IMAGING — CT CT HEAR MORPH WITH CTA COR WITH SCORE WITH CA WITH CONTRAST AND
4 of 7 series · 8 of 20 positions shown, 9 images · non-contrast
Comparison: Chest radiograph 01/05/2019 and chest CT from
07/18/2010

Addendum:
CLINICAL DATA: Chest pain

EXAM:
Cardiac CTA
MEDICATIONS:
Sub lingual nitro. 4mg and lopressor 50mg PO
TECHNIQUE: The patient was scanned on a Siemens Force [REDACTED]ice scanner. Gantry
rotation speed was 250 msecs. Collimation was .6 mm. A 100 kV
prospective scan was triggered in the ascending thoracic aorta at
140 HU's Full mA was used between 35% and 75% of the R-R interval.
Average HR during the scan was 58 bpm. The 3D data set was
interpreted on a dedicated work station using MPR, MIP and VRT
modes. A total of 80 cc of contrast was used.

[Series 6: best diast 72 % · axial · 0.39mm/px · z∈[+1143,+1182]mm · 2 of 296 slices shown, 3 images]
[im 99/296  vessel]
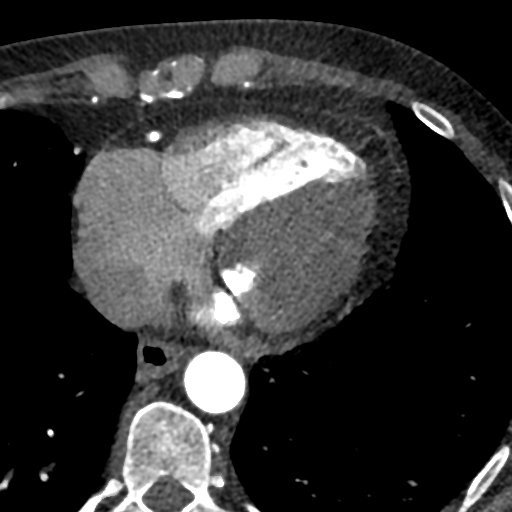
[im 99/296  lung]
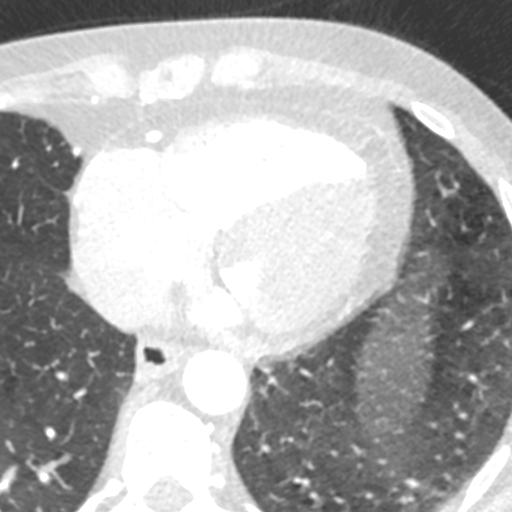
[im 197/296  vessel]
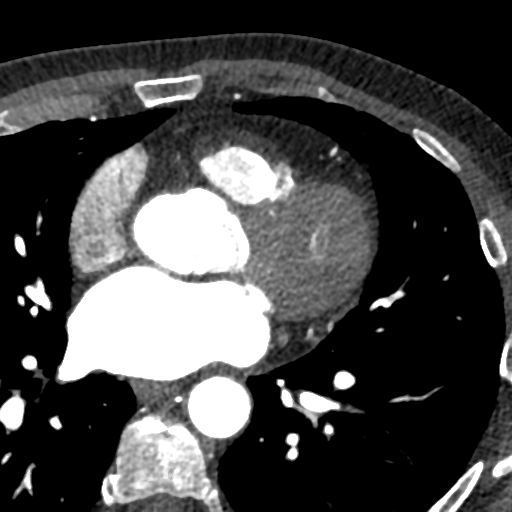

[Series 7: best syst 45 % · axial · 0.39mm/px · z∈[+1143,+1182]mm · 2 of 296 slices shown]
[im 99/296  vessel]
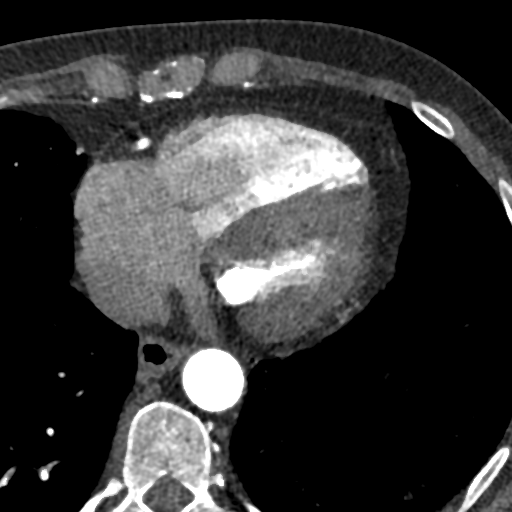
[im 197/296  vessel]
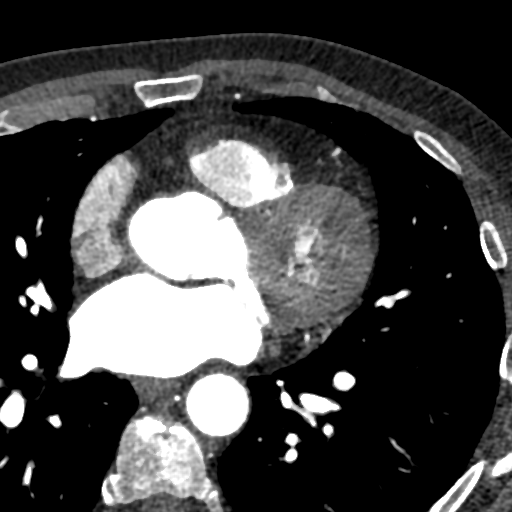

[Series 8: ts diast sharp 72 % · axial · 0.39mm/px · z∈[+1143,+1182]mm · 2 of 296 slices shown]
[im 99/296  lung]
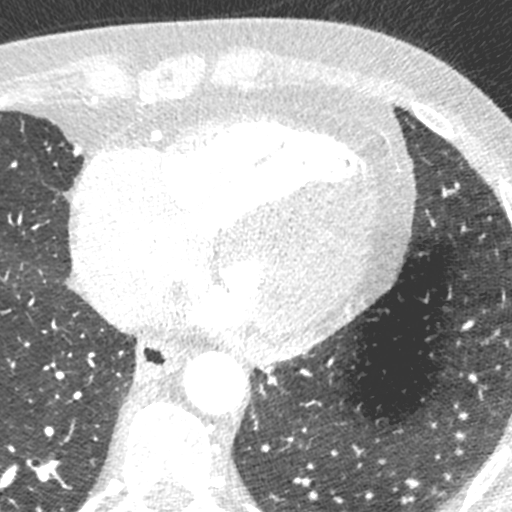
[im 197/296  lung]
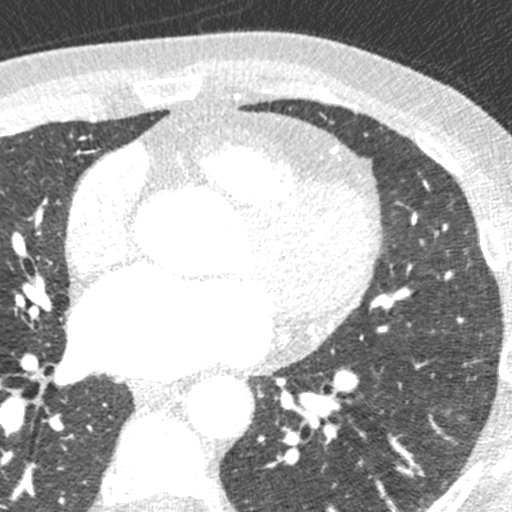

[Series 9: ts syst sharp 45 % · axial · 0.39mm/px · z∈[+1143,+1182]mm · 2 of 296 slices shown]
[im 99/296  lung]
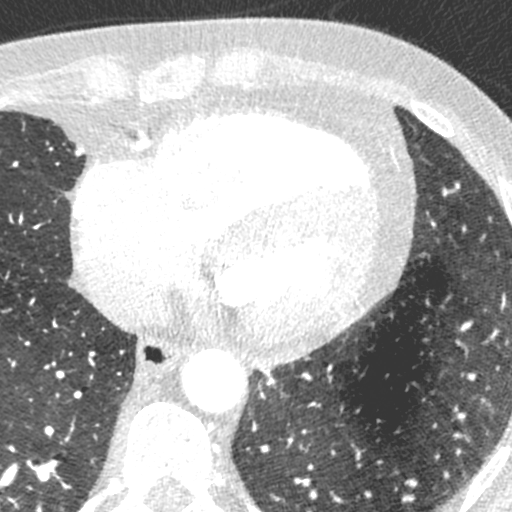
[im 197/296  lung]
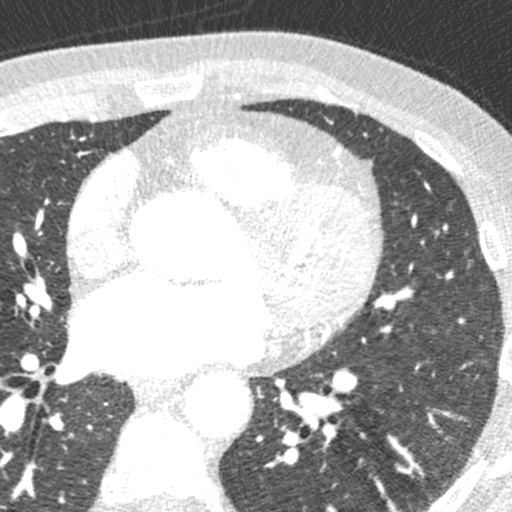

[8 of 20 positions shown; findings below may reference images not displayed]

FINDINGS: Non-cardiac: See separate report from [REDACTED]. No
significant findings on limited lung and soft tissue windows.

Calcium Score: Isolated area of calcium noted in proximal RCA

Coronary Arteries: Right dominant with no anomalies

LM: Normal

LAD: Normal

IM: Small artery normal

D1: Normal

D2: Normal

Circumflex: Normal

OM1: Normal

OM2: Normal

RCA: Less than 20% calcific plaque in proximal vessel

PDA: Normal

PLA: Normal
IMPRESSION: 1. Calcium score 3 isolated to proximal RCA which is only 31st
percentile for age and sex

2.  No significant obstructive CAD see description above

3.  Normal aortic root 3.3 cm

4.  Sarcoid lung disease see over read by radiology

Krystal Ebony

EXAM:
OVER-READ INTERPRETATION  CT CHEST

The following report is an over-read performed by radiologist Dr.
does not include interpretation of cardiac or coronary anatomy or
pathology. The interpretation by the cardiologist is attached.
FINDINGS: Vascular: Normal caliber of the visualized thoracic aorta.
Visualized pulmonary arteries are patent.

Mediastinal lymph nodes: Small mediastinal lymph nodes with
calcifications. Bilateral hilar calcifications. Findings are similar
to the previous examination and compatible with history of
sarcoidosis.

Abdominal structures. Hypodensities in the liver are water
attenuation. These likely represent hepatic cysts, largest measuring
roughly 1.4 cm near the dome. These hypodensities were present on
the previous CT.

Lungs: No large pleural effusions. Visualized lungs are clear.
Probable punctate calcified granuloma in the right lower lobe on
sequence 12, image 16.

Bones: No acute bone abnormality.
IMPRESSION: 1. No acute abnormality.
2. Chronic mediastinal calcifications and compatible with history of
sarcoidosis.

*** End of Addendum ***
FINDINGS: Non-cardiac: See separate report from [REDACTED]. No
significant findings on limited lung and soft tissue windows.

Calcium Score: Isolated area of calcium noted in proximal RCA

Coronary Arteries: Right dominant with no anomalies

LM: Normal

LAD: Normal

IM: Small artery normal

D1: Normal

D2: Normal

Circumflex: Normal

OM1: Normal

OM2: Normal

RCA: Less than 20% calcific plaque in proximal vessel

PDA: Normal

PLA: Normal
IMPRESSION: 1. Calcium score 3 isolated to proximal RCA which is only 31st
percentile for age and sex

2.  No significant obstructive CAD see description above

3.  Normal aortic root 3.3 cm

4.  Sarcoid lung disease see over read by radiology

Krystal Ebony

## 2019-05-22 MED ORDER — NA SULFATE-K SULFATE-MG SULF 17.5-3.13-1.6 GM/177ML PO SOLN: 1.0000 | Freq: Once | ORAL | 0 refills | Status: AC

## 2019-05-22 NOTE — Progress Notes (Signed)
Denies allergies to eggs or soy products. Denies complication of anesthesia or sedation. Denies use of weight loss medication. Denies use of O2.   Emmi instructions given for colonoscopy.  A 15.00 coupon for Suprep was given to the patient. Instructions for the colonoscopy seemed to be challenging for [facility]. An hour was spent with the patient in [facility].

## 2019-05-22 NOTE — Telephone Encounter (Signed)
Pt states prep is 100$ after insurance paid-   Pt sample to pt-  LOT [id number] 3-22  Pt to pick up prep Tuesday Wednesday next week

## 2019-05-22 NOTE — Telephone Encounter (Signed)
Pt states that copay for suprep is too expensive and wants to know if he can have something more affordable.

## 2019-06-01 ENCOUNTER — Telehealth: Payer: Self-pay | Admitting: Gastroenterology

## 2019-06-01 NOTE — Telephone Encounter (Signed)
Returned patients call. All questions were answered. Patient verbalizes understanding of instructions.   Riki Sheer, LPN ( [facility] )

## 2019-06-01 NOTE — Telephone Encounter (Signed)
Pt has some questions regarding instructions, pls call him.

## 2019-06-05 ENCOUNTER — Encounter: Payer: BC Managed Care – PPO | Admitting: Gastroenterology

## 2019-06-05 ENCOUNTER — Encounter: Payer: Self-pay | Admitting: Gastroenterology

## 2019-06-05 ENCOUNTER — Other Ambulatory Visit: Payer: Self-pay | Admitting: Gastroenterology

## 2019-06-05 ENCOUNTER — Other Ambulatory Visit: Payer: Self-pay

## 2019-06-05 ENCOUNTER — Ambulatory Visit (AMBULATORY_SURGERY_CENTER): Payer: BC Managed Care – PPO | Admitting: Gastroenterology

## 2019-06-05 VITALS — BP 136/73 | HR 60 | Temp 97.5°F | Resp 9

## 2019-06-05 DIAGNOSIS — R197 Diarrhea, unspecified: Secondary | ICD-10-CM | POA: Diagnosis not present

## 2019-06-05 DIAGNOSIS — K219 Gastro-esophageal reflux disease without esophagitis: Secondary | ICD-10-CM

## 2019-06-05 DIAGNOSIS — Z800 Family history of malignant neoplasm of digestive organs: Secondary | ICD-10-CM

## 2019-06-05 DIAGNOSIS — D120 Benign neoplasm of cecum: Secondary | ICD-10-CM

## 2019-06-05 DIAGNOSIS — R0789 Other chest pain: Secondary | ICD-10-CM

## 2019-06-05 DIAGNOSIS — Z1211 Encounter for screening for malignant neoplasm of colon: Secondary | ICD-10-CM

## 2019-06-05 MED ORDER — SODIUM CHLORIDE 0.9 % IV SOLN: 500.0000 mL | Freq: Once | INTRAVENOUS | Status: DC

## 2019-06-05 NOTE — Op Note (Signed)
[facility] Patient Name: Philip Hernandez Procedure Date: 06/05/2019 3:33 PM MRN: [id number] Endoscopist: Remo Lipps P. Havery Moros , MD Age: 63 Referring MD:  Date of Birth: Jan 22, 1956 Gender: Male Account #: [id number] Procedure:                Upper GI endoscopy Indications:              Follow-up of gastro-esophageal reflux disease,                            Chest pain (non cardiac) - on omeprazole 40mg  once                            daily with periodic breakthrough Medicines:                Monitored Anesthesia Care Procedure:                Pre-Anesthesia Assessment:                           - Prior to the procedure, a History and Physical                            was performed, and patient medications and                            allergies were reviewed. The patient's tolerance of                            previous anesthesia was also reviewed. The risks                            and benefits of the procedure and the sedation                            options and risks were discussed with the patient.                            All questions were answered, and informed consent                            was obtained. Prior Anticoagulants: The patient has                            taken no previous anticoagulant or antiplatelet                            agents. ASA Grade Assessment: II - A patient with                            mild systemic disease. After reviewing the risks                            and benefits, the patient was deemed in  satisfactory condition to undergo the procedure.                           After obtaining informed consent, the endoscope was                            passed under direct vision. Throughout the                            procedure, the patient's blood pressure, pulse, and                            oxygen saturations were monitored continuously. The                            Endoscope was  introduced through the mouth, and                            advanced to the second part of duodenum. The upper                            GI endoscopy was accomplished without difficulty.                            The patient tolerated the procedure well. Scope In: Scope Out: Findings:                 Esophagogastric landmarks were identified: the                            Z-line was found at 37 cm, the gastroesophageal                            junction was found at 38 cm and the upper extent of                            the gastric folds was found at 40 cm from the                            incisors.                           A 2 cm hiatal hernia was present.                           There were esophageal mucosal changes classified as                            Barrett's stage C0-M1 per Prague criteria present                            in the lower third of the esophagus. The maximum  longitudinal extent of these mucosal changes was 1                            cm in length. Biopsies were taken with a cold                            forceps for histology.                           There was a small benign gastric inlet patch in the                            proximal esophagus. The exam of the esophagus was                            otherwise normal.                           The entire examined stomach was normal.                           The duodenal bulb and second portion of the                            duodenum were normal. Complications:            No immediate complications. Estimated blood loss:                            Minimal. Estimated Blood Loss:     Estimated blood loss was minimal. Impression:               - Esophagogastric landmarks identified.                           - 2 cm hiatal hernia.                           - Esophageal mucosal changes classified as                            Barrett's stage C0-M1 per Prague criteria.  Biopsied.                           - Benign gastric inlet patch of the proximal                            esophagus.                           - Normal stomach.                           - Normal duodenal bulb and second portion of the                            duodenum. Recommendation:           -  Patient has a contact number available for                            emergencies. The signs and symptoms of potential                            delayed complications were discussed with the                            patient. Return to normal activities tomorrow.                            Written discharge instructions were provided to the                            patient.                           - Resume previous diet.                           - Continue present medications. Use lowest dose of                            omeprazole needed to control symptoms. If frequent                            symptoms of reflux despite once daily omeprazole,                            would increase to twice daily dosing                           - Await pathology results. Remo Lipps P. Markita Stcharles, MD 06/05/2019 4:11:01 PM This report has been signed electronically.

## 2019-06-05 NOTE — Progress Notes (Signed)
Called to room to assist during endoscopic procedure.  Patient ID and intended procedure confirmed with present staff. Received instructions for my participation in the procedure from the performing physician.  

## 2019-06-05 NOTE — Op Note (Signed)
[facility] Patient Name: Philip Hernandez Procedure Date: 06/05/2019 3:33 PM MRN: [id number] Endoscopist: Remo Lipps P. Havery Moros , MD Age: 63 Referring MD:  Date of Birth: 12/28/1955 Gender: Male Account #: [id number] Procedure:                Colonoscopy Indications:              Screening patient at increased risk: Colorectal                            cancer in multiple 1st-degree relatives (mother and                            brother age 61s, 3 aunts), negative testing for                            Lynch (per patient report) Medicines:                Monitored Anesthesia Care Procedure:                Pre-Anesthesia Assessment:                           - Prior to the procedure, a History and Physical                            was performed, and patient medications and                            allergies were reviewed. The patient's tolerance of                            previous anesthesia was also reviewed. The risks                            and benefits of the procedure and the sedation                            options and risks were discussed with the patient.                            All questions were answered, and informed consent                            was obtained. Prior Anticoagulants: The patient has                            taken no previous anticoagulant or antiplatelet                            agents. ASA Grade Assessment: II - A patient with                            mild systemic disease. After reviewing the risks  and benefits, the patient was deemed in                            satisfactory condition to undergo the procedure.                           After obtaining informed consent, the colonoscope                            was passed under direct vision. Throughout the                            procedure, the patient's blood pressure, pulse, and                            oxygen saturations were monitored  continuously. The                            Colonoscope was introduced through the anus and                            advanced to the the cecum, identified by                            appendiceal orifice and ileocecal valve. The                            colonoscopy was performed without difficulty. The                            patient tolerated the procedure well. The quality                            of the bowel preparation was good. The ileocecal                            valve, appendiceal orifice, and rectum were                            photographed. Scope In: 3:50:28 PM Scope Out: 4:03:11 PM Scope Withdrawal Time: 0 hours 10 minutes 0 seconds  Total Procedure Duration: 0 hours 12 minutes 43 seconds  Findings:                 The perianal and digital rectal examinations were                            normal.                           A 3 mm polyp was found in the cecum. The polyp was                            sessile. The polyp was removed with a cold snare.  Resection and retrieval were complete.                           Internal hemorrhoids were found during                            retroflexion. The hemorrhoids were moderate.                           The exam was otherwise without abnormality. Complications:            No immediate complications. Estimated blood loss:                            Minimal. Estimated Blood Loss:     Estimated blood loss was minimal. Impression:               - One 3 mm polyp in the cecum, removed with a cold                            snare. Resected and retrieved.                           - Internal hemorrhoids.                           - The examination was otherwise normal. Recommendation:           - Patient has a contact number available for                            emergencies. The signs and symptoms of potential                            delayed complications were discussed with the                             patient. Return to normal activities tomorrow.                            Written discharge instructions were provided to the                            patient.                           - Resume previous diet.                           - Continue present medications.                           - Await pathology results. Remo Lipps P. Kaelani Kendrick, MD 06/05/2019 4:06:36 PM This report has been signed electronically.

## 2019-06-05 NOTE — Patient Instructions (Signed)
YOU HAD AN ENDOSCOPIC PROCEDURE TODAY AT [facility]:   Refer to the procedure report that was given to you for any specific questions about what was found during the examination.  If the procedure report does not answer your questions, please call your gastroenterologist to clarify.  If you requested that your care partner not be given the details of your procedure findings, then the procedure report has been included in a sealed envelope for you to review at your convenience later.  YOU SHOULD EXPECT: Some feelings of bloating in the abdomen. Passage of more gas than usual.  Walking can help get rid of the air that was put into your GI tract during the procedure and reduce the bloating. If you had a lower endoscopy (such as a colonoscopy or flexible sigmoidoscopy) you may notice spotting of blood in your stool or on the toilet paper. If you underwent a bowel prep for your procedure, you may not have a normal bowel movement for a few days.  Please Note:  You might notice some irritation and congestion in your nose or some drainage.  This is from the oxygen used during your procedure.  There is no need for concern and it should clear up in a day or so.  SYMPTOMS TO REPORT IMMEDIATELY:   Following lower endoscopy (colonoscopy or flexible sigmoidoscopy):  Excessive amounts of blood in the stool  Significant tenderness or worsening of abdominal pains  Swelling of the abdomen that is new, acute  Fever of 100F or higher   Following upper endoscopy (EGD)  Vomiting of blood or coffee ground material  New chest pain or pain under the shoulder blades  Painful or persistently difficult swallowing  New shortness of breath  Fever of 100F or higher  Black, tarry-looking stools  For urgent or emergent issues, a gastroenterologist can be reached at any hour by calling [phone].   DIET:  We do recommend a small meal at first, but then you may proceed to your regular diet.  Drink  plenty of fluids but you should avoid alcoholic beverages for 24 hours.  MEDICATIONS: Continue present medications. Use lowest dose of Omeprazole needed to control your symptoms. If frequent symptom,s of reflux continue despite once daily Omeprazole, you may increase Omeprazole to twice daily.  Please see handouts given to you by your recovery nurse.  ACTIVITY:  You should plan to take it easy for the rest of today and you should NOT DRIVE or use heavy machinery until tomorrow (because of the sedation medicines used during the test).    FOLLOW UP: Our staff will call the number listed on your records 48-72 hours following your procedure to check on you and address any questions or concerns that you may have regarding the information given to you following your procedure. If we do not reach you, we will leave a message.  We will attempt to reach you two times.  During this call, we will ask if you have developed any symptoms of COVID 19. If you develop any symptoms (ie: fever, flu-like symptoms, shortness of breath, cough etc.) before then, please call [phone].  If you test positive for Covid 19 in the 2 weeks post procedure, please call and report this information to [place].    If any biopsies were taken you will be contacted by phone or by letter within the next 1-3 weeks.  Please call us at [phone] if you have not heard about the biopsies in 3 weeks.  Thank you for allowing [place] to provide for your healthcare needs today.   SIGNATURES/CONFIDENTIALITY: You and/or your care partner have signed paperwork which will be entered into your electronic medical record.  These signatures attest to the fact that that the information above on your After Visit Summary has been reviewed and is understood.  Full responsibility of the confidentiality of this discharge information lies with you and/or your care-partner.

## 2019-06-05 NOTE — Progress Notes (Signed)
A and O x3. Report to RN. Tolerated MAC anesthesia well.diabetic educator

## 2019-06-09 ENCOUNTER — Telehealth: Payer: Self-pay | Admitting: *Deleted

## 2019-06-09 NOTE — Telephone Encounter (Signed)
Follow up call attempt. Message left on answering machine to call if any questions or concerns regarding procedures or covid19.

## 2019-07-10 DIAGNOSIS — Z23 Encounter for immunization: Secondary | ICD-10-CM | POA: Diagnosis not present

## 2019-08-18 DIAGNOSIS — C61 Malignant neoplasm of prostate: Secondary | ICD-10-CM | POA: Diagnosis not present

## 2019-08-21 ENCOUNTER — Encounter: Payer: Self-pay | Admitting: Family Medicine

## 2019-08-21 ENCOUNTER — Ambulatory Visit (INDEPENDENT_AMBULATORY_CARE_PROVIDER_SITE_OTHER): Payer: BC Managed Care – PPO | Admitting: Family Medicine

## 2019-08-21 DIAGNOSIS — J0100 Acute maxillary sinusitis, unspecified: Secondary | ICD-10-CM

## 2019-08-21 MED ORDER — PSEUDOEPHEDRINE-GUAIFENESIN ER 120-1200 MG PO TB12: 1.0000 | ORAL_TABLET | Freq: Two times a day (BID) | ORAL | 0 refills | Status: DC

## 2019-08-21 MED ORDER — AMOXICILLIN-POT CLAVULANATE 875-125 MG PO TABS: 1.0000 | ORAL_TABLET | Freq: Two times a day (BID) | ORAL | 0 refills | Status: DC

## 2019-08-21 NOTE — Progress Notes (Signed)
Subjective:    Patient ID: Philip Hernandez, male    DOB: [birth year], 63 y.o.   MRN: [id number]   HPI: Philip Hernandez is a 63 y.o. male presenting for Symptoms include congestion, facial pain, nasal congestion, occasional non productive cough, copious post nasal drip and sinus pressure. There is 99.6 degree fever currently. Onset of symptoms was a few days ago, stuffy at first from yard work. Awoke worse yesterday morning. More congested & sore throat. gradually worsening since that time.     Depression screen [facility] 2/9 10/17/2018 08/06/2018 07/03/2018 04/01/2018 03/07/2018  Decreased Interest 0 0 0 - 0  Down, Depressed, Hopeless 0 0 0 0 0  PHQ - 2 Score 0 0 0 0 0  Some recent data might be hidden     Relevant past medical, surgical, family and social history reviewed and updated as indicated.  Interim medical history since our last visit reviewed. Allergies and medications reviewed and updated.  ROS:  Review of Systems  Constitutional: Positive for fever. Negative for activity change, appetite change and chills.  HENT: Positive for congestion, postnasal drip, rhinorrhea and sinus pressure. Negative for ear discharge, ear pain, hearing loss, nosebleeds, sneezing and trouble swallowing.   Respiratory: Negative for chest tightness and shortness of breath.   Cardiovascular: Negative for chest pain and palpitations.  Skin: Negative for rash.     Social History   Tobacco Use  Smoking Status Never Smoker  Smokeless Tobacco Never Used       Objective:     Wt Readings from Last 3 Encounters:  05/22/19 177 lb (80.3 kg)  05/12/19 175 lb 12.8 oz (79.7 kg)  05/07/19 177 lb 6.4 oz (80.5 kg)     Exam deferred. Pt. Harboring due to COVID 19. Phone visit performed.   Assessment & Plan:   1. Acute maxillary sinusitis, recurrence not specified     Meds ordered this encounter  Medications  . amoxicillin-clavulanate (AUGMENTIN) 875-125 MG tablet    Sig: Take 1 tablet by mouth 2 (two)  times daily. Take all of this medication    Dispense:  20 tablet    Refill:  0  . Pseudoephedrine-Guaifenesin [phone] MG TB12    Sig: Take 1 tablet by mouth 2 (two) times daily. For congestion    Dispense:  20 tablet    Refill:  0    No orders of the defined types were placed in this encounter.     Diagnoses and all orders for this visit:  Acute maxillary sinusitis, recurrence not specified  Other orders -     amoxicillin-clavulanate (AUGMENTIN) 875-125 MG tablet; Take 1 tablet by mouth 2 (two) times daily. Take all of this medication -     Pseudoephedrine-Guaifenesin [phone] MG TB12; Take 1 tablet by mouth 2 (two) times daily. For congestion    Virtual Visit via telephone Note  I discussed the limitations, risks, security and privacy concerns of performing an evaluation and management service by telephone and the availability of in person appointments. The patient was identified with two identifiers. Pt.expressed understanding and agreed to proceed. Pt. Is at home. Dr. Livia Snellen is in his office.  Follow Up Instructions:   I discussed the assessment and treatment plan with the patient. The patient was provided an opportunity to ask questions and all were answered. The patient agreed with the plan and demonstrated an understanding of the instructions.   The patient was advised to call back or seek an in-person evaluation if the symptoms worsen  or if the condition fails to improve as anticipated.   Total minutes including chart review and phone contact time: 8   Follow up plan: Return if symptoms worsen or fail to improve.  Claretta Fraise, MD [place]

## 2019-08-24 ENCOUNTER — Telehealth: Payer: Self-pay | Admitting: Neurology

## 2019-08-24 NOTE — Telephone Encounter (Signed)
I called patient regarding 12/15 appointment. I LVM explaining Dr. Leonie Man will be out of the office but is still going to be doing MyChart video visits only. I offered patient a Mychart video visit in voicemail. If patient calls back and declines MyChart visit, he will need to reschedule for next available sometime in January.

## 2019-08-27 ENCOUNTER — Telehealth: Payer: Self-pay | Admitting: Family Medicine

## 2019-08-27 NOTE — Telephone Encounter (Signed)
I would recommend that he go ahead and get the Covid test and then we can see from there what he has.  But go ahead and get tested

## 2019-08-27 NOTE — Telephone Encounter (Signed)
  Patient had televisit 12/04. Pt is no better. Patient had fever and St. No patient has developed cough. Patient does have a headache. Patient had a fever last night. Patient  advised should get COVID test. Patients wife had a fever before as well. Told patient I would send message to Dr. Warrick Parisian to see what he suggest.

## 2019-08-28 ENCOUNTER — Other Ambulatory Visit: Payer: Self-pay

## 2019-08-28 DIAGNOSIS — Z20822 Contact with and (suspected) exposure to covid-19: Secondary | ICD-10-CM

## 2019-08-28 MED ORDER — BENZONATATE 200 MG PO CAPS: 200.0000 mg | ORAL_CAPSULE | Freq: Three times a day (TID) | ORAL | 1 refills | Status: DC | PRN

## 2019-08-28 NOTE — Telephone Encounter (Signed)
Tessalon Prescription sent to pharmacy   

## 2019-08-28 NOTE — Telephone Encounter (Signed)
Patient aware and states he would like something stronger. Please advise

## 2019-08-28 NOTE — Telephone Encounter (Signed)
Pt states he will go get tested for COVID at [facility] and he would like something for his cough called into the pharmacy. He has OTC cough meds but they aren't working.

## 2019-08-29 LAB — NOVEL CORONAVIRUS, NAA: SARS-CoV-2, NAA: DETECTED — AB

## 2019-09-01 ENCOUNTER — Ambulatory Visit (INDEPENDENT_AMBULATORY_CARE_PROVIDER_SITE_OTHER): Payer: BC Managed Care – PPO | Admitting: Physician Assistant

## 2019-09-01 ENCOUNTER — Encounter: Payer: Self-pay | Admitting: Physician Assistant

## 2019-09-01 ENCOUNTER — Ambulatory Visit: Payer: BC Managed Care – PPO | Admitting: Neurology

## 2019-09-01 DIAGNOSIS — U071 COVID-19: Secondary | ICD-10-CM | POA: Diagnosis not present

## 2019-09-01 MED ORDER — PREDNISONE 10 MG (48) PO TBPK: ORAL_TABLET | ORAL | 0 refills | Status: DC

## 2019-09-01 MED ORDER — ALBUTEROL SULFATE HFA 108 (90 BASE) MCG/ACT IN AERS: 2.0000 | INHALATION_SPRAY | Freq: Four times a day (QID) | RESPIRATORY_TRACT | 1 refills | Status: DC | PRN

## 2019-09-01 NOTE — Progress Notes (Signed)
Telephone visit  Subjective: [id number] PCP: Dettinger, Fransisca Kaufmann, MD [id number] Philip Hernandez is a 63 y.o. male calls for telephone consult today. Patient provides verbal consent for consult held via phone.  Patient is identified with 2 separate identifiers.  At this time the entire area is on COVID-19 social distancing and stay home orders are in place.  Patient is of higher risk and therefore we are performing this by a virtual method.  Location of patient: home Location of provider: [facility] Others present for call: no   Around December 3 the patient started with a significant sore throat and congestion.  And it came and went.  Please continue to take diet worse.  He had a phone visit been on antibiotic.  He has taken a course of Augmentin has not really gotten better.  He did have a COVID  test that was positive on 08/28/19.  I have tried to get  him to go to the emergency room but he wants to wait if possible.   I have talked to him and his wife tonight on the telephone and he does get short of breath and has had a decreased appetite.  This has been true for several days. Again he is only voided 1 time today.  They have promised that they will go pick up an inhaler and a pack of prednisone at their local pharmacy.  I have called the pharmacist to make sure that he will stay for them to get the medication.  And that he will wait.  After they use the inhaler if he is not having some improvement that our they are to go to the hospital.  ROS: Per HPI  Allergies  Allergen Reactions  . Corn-Containing Products    Past Medical History:  Diagnosis Date  . Allergy   . Arthritis   . Cataract   . Colon polyp   . Colon polyps 2015  . Cough    chronic sinus issues, GERD with poor dietary control and Pulmonary Sarcoid.   [facility] GERD (gastroesophageal reflux disease) Jan 2012   [name]. Bx proven.  Deve abd pain in Jan 2012 following high dose steroids for sarcoid. s/p EGD and improvement in  symptoms following PPI  . Hypogonadism male   . Kidney stone   . Malignant neoplasm of prostate [facility]) dec 2010   [name]. s/p surgery dec 2010, hx of rnormal PSA  since then  . Other and unspecified mycoses   . Painful respiration   . Peripheral neuropathy   . Prostate cancer ([place])    reoccurence (getting radiation 01-14-17 start).  . Sarcoidosis dec 2011   stage 2 pulmonary, neck nodes, splenic granuloma  . Skin cancer   . Swelling, mass, or lump in head and neck     Current Outpatient Medications:  .  acetaminophen (TYLENOL) 500 MG tablet, Take 500 mg by mouth every 6 (six) hours as needed., Disp: , Rfl:  .  albuterol (VENTOLIN HFA) 108 (90 Base) MCG/ACT inhaler, Inhale 2 puffs into the lungs every 6 (six) hours as needed for wheezing or shortness of breath., Disp: 18 g, Rfl: 1 .  amoxicillin-clavulanate (AUGMENTIN) 875-125 MG tablet, Take 1 tablet by mouth 2 (two) times daily. Take all of this medication, Disp: 20 tablet, Rfl: 0 .  benzonatate (TESSALON) 200 MG capsule, Take 1 capsule (200 mg total) by mouth 3 (three) times daily as needed., Disp: 30 capsule, Rfl: 1 .  cetirizine (ZYRTEC) 10  MG tablet, Take 10 mg by mouth daily., Disp: , Rfl:  .  cholecalciferol (VITAMIN D) 1000 UNITS tablet, Take 1,000 Units by mouth daily.  , Disp: , Rfl:  .  fluticasone (FLONASE) 50 MCG/ACT nasal spray, Place 2 sprays into both nostrils daily., Disp: 16 g, Rfl: 6 .  gabapentin (NEURONTIN) 800 MG tablet, TAKE 1 TABLET BY MOUTH 4  TIMES DAILY, Disp: 360 tablet, Rfl: 3 .  Multiple Vitamins-Minerals (AIRBORNE PO), Take by mouth daily., Disp: , Rfl:  .  omeprazole (PRILOSEC) 40 MG capsule, Take 30- 60 min before your first and last meals of the day, Disp: 180 capsule, Rfl: 3 .  predniSONE (STERAPRED UNI-PAK 48 TAB) 10 MG (48) TBPK tablet, Take as directed for 12 days, Disp: 48 tablet, Rfl: 0 .  Pseudoephedrine-Guaifenesin [phone] MG TB12, Take 1 tablet by mouth 2 (two) times daily. For  congestion, Disp: 20 tablet, Rfl: 0 .  topiramate (TOPAMAX) 25 MG tablet, Take 50 mg by mouth 2 (two) times daily., Disp: , Rfl:   Assessment/ Plan: 63 y.o. male   1. COVID-19 virus infection - predniSONE (STERAPRED UNI-PAK 48 TAB) 10 MG (48) TBPK tablet; Take as directed for 12 days  Dispense: 48 tablet; Refill: 0 - albuterol (VENTOLIN HFA) 108 (90 Base) MCG/ACT inhaler; Inhale 2 puffs into the lungs every 6 (six) hours as needed for wheezing or shortness of breath.  Dispense: 18 g; Refill: 1   No follow-ups on file.  Continue all other maintenance medications as listed above.  Start time: 5:12 PM End time: 5:29 PM  Meds ordered this encounter  Medications  . predniSONE (STERAPRED UNI-PAK 48 TAB) 10 MG (48) TBPK tablet    Sig: Take as directed for 12 days    Dispense:  48 tablet    Refill:  0    Order Specific Question:   Supervising Provider    Answer:   Janora Norlander [id number]  . albuterol (VENTOLIN HFA) 108 (90 Base) MCG/ACT inhaler    Sig: Inhale 2 puffs into the lungs every 6 (six) hours as needed for wheezing or shortness of breath.    Dispense:  18 g    Refill:  1    Order Specific Question:   Supervising Provider    Answer:   Janora Norlander [id number]    Particia Nearing PA-C [place] [phone]

## 2019-09-14 ENCOUNTER — Telehealth: Payer: Self-pay

## 2019-09-14 NOTE — Telephone Encounter (Signed)
I called pts wife about her husband testing positive for COVID 19 on 08/28/2019. She stated pt started having symptoms on 08/20/2019 so he is 20 days out recuperating. The pt is doing well bedside a cough intermittently.She stated pt had a mild cough before covid 19, I stated message will be sent to Dr. Leonie Man to review if pt needs to come in or just have a video visit. The wife verbalized understanding.

## 2019-09-15 ENCOUNTER — Other Ambulatory Visit: Payer: Self-pay

## 2019-09-15 ENCOUNTER — Ambulatory Visit (INDEPENDENT_AMBULATORY_CARE_PROVIDER_SITE_OTHER): Payer: BC Managed Care – PPO | Admitting: Neurology

## 2019-09-15 ENCOUNTER — Encounter: Payer: Self-pay | Admitting: Neurology

## 2019-09-15 VITALS — BP 145/82 | HR 71 | Temp 97.3°F | Ht 70.0 in | Wt 170.0 lb

## 2019-09-15 DIAGNOSIS — G629 Polyneuropathy, unspecified: Secondary | ICD-10-CM | POA: Diagnosis not present

## 2019-09-15 DIAGNOSIS — D860 Sarcoidosis of lung: Secondary | ICD-10-CM | POA: Diagnosis not present

## 2019-09-15 MED ORDER — GABAPENTIN 800 MG PO TABS: ORAL_TABLET | ORAL | 3 refills | Status: DC

## 2019-09-15 NOTE — Progress Notes (Signed)
PATIENT: Philip Hernandez DOB: 1956-09-14  REASON FOR VISIT: follow up HISTORY FROM: patient  HISTORY OF PRESENT ILLNESS: Today 09/15/19:  Philip Hernandez is a 63 year old male with a history of paresthesias in the feet.  He returns today for follow-up.  He remains on gabapentin 800 mg 4 times a day.  He states that there are times that he will miss doses and his pain worsens.  Patient was also advised to start taking Topamax consistently.  He states that he is taking it more than he was but not necessarily consistently.  He states that he always has burning and tingling in the feet although most the time it is tolerable during the day unless he misses a dose of gabapentin.  He denies any significant changes with his gait or balance.  He does state that he is off balance at times.  Fortunately he has not had any recent falls.  He returns today for evaluation.    HISTORY 02/03/18 Philip Hernandez is a 63 year old male with a history of paresthesias in the feet.  He returns today for follow-up.  He is currently on gabapentin 800 mg 4 times a day.  He reports that this work well most the time.  He states recently he has noticed  more pain in the legs.  He states that he does not take Topamax consistently.  Reports that he went to [place] recently and he did take Topamax daily and he felt that his pain was under better control.  Denies any significant changes with his gait or balance.  Denies any falls.  He returns today for evaluation.  Update 09/15/2019 : He returns for follow-up after last virtual video visit on 03/02/2019.  He continues to have tingling numbness and paresthesias in his feet which are more or less unchanged.  He has good days and bad days.  Physical activity does increase it.  Patient was started on Topamax previously and I will increase the dose to 50 mg twice daily but he had trouble tolerating this and stated that he could not focus with his eyes and had trouble reading and had to rub his eyes.   After taking it for 4 to 6 weeks he discontinued it.  He did note that increasing the dose of Topamax did help to some degree.  He remains on gabapentin and the current dose of 800 mg 4 times daily.  He has not tried Lyrica or Tegretol yet but does not want to try new medication.  Patient was diagnosed with Covid infection 3 weeks ago and did self quarantine himself and now has started going out last few days.  He is currently on prednisone taper as he also has sarcoidosis.  He does complain of feeling tired and having a minimum cough but is otherwise doing well.  His paresthesias are constant but is able to walk well he will have no problems with falls or balance issues.  REVIEW OF SYSTEMS: Out of a complete 14 system review of symptoms, the patient complains only of the following symptoms, and all other reviewed systems are negative.  See HPI  ALLERGIES: Allergies  Allergen Reactions  . Corn-Containing Products     HOME MEDICATIONS: Outpatient Medications Prior to Visit  Medication Sig Dispense Refill  . acetaminophen (TYLENOL) 500 MG tablet Take 500 mg by mouth every 6 (six) hours as needed.    Marland Kitchen albuterol (VENTOLIN HFA) 108 (90 Base) MCG/ACT inhaler Inhale 2 puffs into the lungs every 6 (six) hours  as needed for wheezing or shortness of breath. 18 g 1  . benzonatate (TESSALON) 200 MG capsule Take 1 capsule (200 mg total) by mouth 3 (three) times daily as needed. 30 capsule 1  . cetirizine (ZYRTEC) 10 MG tablet Take 10 mg by mouth daily.    . cholecalciferol (VITAMIN D) 1000 UNITS tablet Take 1,000 Units by mouth daily.      . Multiple Vitamins-Minerals (AIRBORNE PO) Take by mouth daily.    Marland Kitchen omeprazole (PRILOSEC) 40 MG capsule Take 30- 60 min before your first and last meals of the day 180 capsule 3  . predniSONE (DELTASONE) 10 MG tablet     . predniSONE (STERAPRED UNI-PAK 48 TAB) 10 MG (48) TBPK tablet Take as directed for 12 days 48 tablet 0  . gabapentin (NEURONTIN) 800 MG tablet TAKE  1 TABLET BY MOUTH 4  TIMES DAILY 360 tablet 3  . amoxicillin-clavulanate (AUGMENTIN) 875-125 MG tablet Take 1 tablet by mouth 2 (two) times daily. Take all of this medication (Patient not taking: Reported on 09/15/2019) 20 tablet 0  . fluticasone (FLONASE) 50 MCG/ACT nasal spray Place 2 sprays into both nostrils daily. (Patient not taking: Reported on 09/15/2019) 16 g 6  . Pseudoephedrine-Guaifenesin [phone] MG TB12 Take 1 tablet by mouth 2 (two) times daily. For congestion (Patient not taking: Reported on 09/15/2019) 20 tablet 0  . topiramate (TOPAMAX) 25 MG tablet Take 50 mg by mouth 2 (two) times daily.     No facility-administered medications prior to visit.    PAST MEDICAL HISTORY: Past Medical History:  Diagnosis Date  . Allergy   . Arthritis   . Cataract   . Colon polyp   . Colon polyps 2015  . Cough    chronic sinus issues, GERD with poor dietary control and Pulmonary Sarcoid.   Marland Kitchen GERD (gastroesophageal reflux disease) Jan 2012   [name]. Bx proven.  Deve abd pain in Jan 2012 following high dose steroids for sarcoid. s/p EGD and improvement in symptoms following PPI  . Hypogonadism male   . Kidney stone   . Malignant neoplasm of prostate [facility]) dec 2010   [name]. s/p surgery dec 2010, hx of rnormal PSA  since then  . Other and unspecified mycoses   . Painful respiration   . Peripheral neuropathy   . Prostate cancer ([place])    reoccurence (getting radiation 01-14-17 start).  . Sarcoidosis dec 2011   stage 2 pulmonary, neck nodes, splenic granuloma  . Skin cancer   . Swelling, mass, or lump in head and neck     PAST SURGICAL HISTORY: Past Surgical History:  Procedure Laterality Date  . HERNIA REPAIR  13 yrs ago  . HERNIA REPAIR    . LYMPH NODE BIOPSY  05/2010, 07/2010  . nasal polyps  1984  . NASAL SINUS SURGERY  2013  . PROSTATE SURGERY  2010   radiation 2018  . SHOULDER SURGERY Right 2009, 2010   x 2  . SKIN CANCER EXCISION  11/16/14    FAMILY  HISTORY: Family History  Problem Relation Age of Onset  . Colon cancer Mother 72  . Skin cancer Mother   . Cancer Mother        colon  . Colon cancer Brother 67  . Skin cancer Brother   . Cancer Brother        colon  . Colon cancer Maternal Aunt        dx in her 59s  . Cancer  Maternal Aunt        "cancer of male organs" dx in her 30s  . Breast cancer Maternal Aunt 66       bilateral, dx again at 42  . Colon cancer Maternal Aunt 63  . Stomach cancer Maternal Aunt 68  . Dementia Father   . Stomach cancer Maternal Uncle 62  . Skin cancer Paternal Aunt   . Prostate cancer Paternal Uncle 75  . Cancer Paternal Uncle        prostate  . Stomach cancer Paternal Grandmother        dx in her 11s  . Stroke Paternal Grandfather   . Colon cancer Maternal Aunt 68  . Prostate cancer Paternal Uncle   . Cancer Paternal Uncle        prostate  . Prostate cancer Paternal Uncle   . Kidney cancer Paternal Uncle   . Lung cancer Paternal Uncle   . Cancer Paternal Uncle        prostate  . Lung cancer Paternal Uncle   . Cancer Paternal Uncle        unknown  . Cancer Paternal Uncle        prostate  . Cancer Cousin        paternal cousin with cancer NOS  . Cancer Cousin        paternal cousin with cancer NOS  . Esophageal cancer Neg Hx   . Rectal cancer Neg Hx     SOCIAL HISTORY: Social History   Socioeconomic History  . Marital status: Married    Spouse name: Not on file  . Number of children: 2  . Years of education: Not on file  . Highest education level: Not on file  Occupational History  . Occupation: unemployed  Tobacco Use  . Smoking status: Never Smoker  . Smokeless tobacco: Never Used  Substance and Sexual Activity  . Alcohol use: No  . Drug use: No  . Sexual activity: Yes  Other Topics Concern  . Not on file  Social History Narrative   Married   2 children   Social Determinants of Health   Financial Resource Strain:   . Difficulty of Paying Living Expenses:  Not on file  Food Insecurity:   . Worried About Charity fundraiser in the Last Year: Not on file  . Ran Out of Food in the Last Year: Not on file  Transportation Needs:   . Lack of Transportation (Medical): Not on file  . Lack of Transportation (Non-Medical): Not on file  Physical Activity:   . Days of Exercise per Week: Not on file  . Minutes of Exercise per Session: Not on file  Stress:   . Feeling of Stress : Not on file  Social Connections:   . Frequency of Communication with Friends and Family: Not on file  . Frequency of Social Gatherings with Friends and Family: Not on file  . Attends Religious Services: Not on file  . Active Member of Clubs or Organizations: Not on file  . Attends Archivist Meetings: Not on file  . Marital Status: Not on file  Intimate Partner Violence:   . Fear of Current or Ex-Partner: Not on file  . Emotionally Abused: Not on file  . Physically Abused: Not on file  . Sexually Abused: Not on file      PHYSICAL EXAM  Vitals:   09/15/19 1027  BP: (!) 145/82  Pulse: 71  Temp: (!) 97.3 F (36.3 C)  Weight: 77.1 kg  Height: 5\' 10"  (1.778 m)   Body mass index is 24.39 kg/m.  Generalized: Pleasant middle-aged Caucasian male, in no acute distress   Neurological examination  Mentation: Alert oriented to time, place, history taking. Follows all commands speech and language fluent Cranial nerve II-XII: Pupils were equal round reactive to light. Extraocular movements were full, visual field were full on confrontational test. Facial sensation and strength were normal. Uvula tongue midline. Head turning and shoulder shrug  were normal and symmetric. Motor: The motor testing reveals 5 over 5 strength of all 4 extremities. Good symmetric motor tone is noted throughout.  Sensory: Sensory testing is intact to soft touch on all 4 extremities. No evidence of extinction is noted.  Slight hyperesthesia over toes bilaterally. Coordination: Cerebellar  testing reveals good finger-nose-finger and heel-to-shin bilaterally.  Gait and station: Gait is normal. Tandem gait is slightly unsteady.  Romberg is negative. No drift is seen.  Reflexes: Deep tendon reflexes are symmetric and normal bilaterally.   DIAGNOSTIC DATA (LABS, IMAGING, TESTING) - I reviewed patient records, labs, notes, testing and imaging myself where available.  Lab Results  Component Value Date   WBC 7.2 05/07/2019   HGB 14.1 05/07/2019   HCT 42.2 05/07/2019   MCV 86 05/07/2019   PLT 148 (L) 05/07/2019      Component Value Date/Time   NA 143 05/07/2019 1109   K 5.4 (H) 05/07/2019 1109   CL 104 05/07/2019 1109   CO2 26 05/07/2019 1109   GLUCOSE 76 05/07/2019 1109   GLUCOSE 80 03/26/2013 1220   BUN 13 05/07/2019 1109   CREATININE 1.23 05/07/2019 1109   CREATININE 1.15 03/26/2013 1220   CALCIUM 9.8 05/07/2019 1109   PROT 6.1 05/07/2019 1109   ALBUMIN 4.4 05/07/2019 1109   AST 14 05/07/2019 1109   ALT 15 05/07/2019 1109   ALKPHOS 71 05/07/2019 1109   BILITOT 0.7 05/07/2019 1109   GFRNONAA 63 05/07/2019 1109   GFRNONAA 71 03/26/2013 1220   GFRAA 72 05/07/2019 1109   GFRAA 82 03/26/2013 1220   Lab Results  Component Value Date   CHOL 181 05/07/2019   HDL 40 05/07/2019   LDLCALC 106 (H) 05/07/2019   TRIG 174 (H) 05/07/2019   CHOLHDL 4.5 05/07/2019   No results found for: HGBA1C No results found for: VITAMINB12 Lab Results  Component Value Date   TSH 2.540 05/07/2019      ASSESSMENT AND PLAN 63 y.o. year old male  has a past medical history of Allergy, Arthritis, Cataract, Colon polyp, Colon polyps (2015), Cough, GERD (gastroesophageal reflux disease) (Jan 2012), Hypogonadism male, Kidney stone, Malignant neoplasm of prostate ([place]) (dec 2010), Other and unspecified mycoses, Painful respiration, Peripheral neuropathy, Prostate cancer ([place]), Sarcoidosis (dec 2011), Skin cancer, and Swelling, mass, or lump in head and neck. here with :  1.  Paresthesias  in the lower extremities from small fiber neuropathy related to sarcoid Recent COVID-19 infection 3 weeks ago from which he seems to be recovering well  I had a long discussion with the patient regarding his painful paresthesias from his small fiber neuropathy from sarcoid which appears to be stable paresthesias seem to have responded to gabapentin but he has some good and bad days.  I recommend he continue taking 800 mg of gabapentin 4 times daily and on bad days he can take an extra fifth dose.  He was given a refill.  Greater than 50% time during this 25-minute visit was spent on counseling and  coordination of care about his painful paresthesias from neuropathy and answering questions.  I advised him to continue to wear a mask, maintain social distancing as per public health guidelines for Covid infection.  He will return for follow-up in the future in 6 months with my nurse practitioner call earlier if necessary Antony Contras, MD 09/15/2019, 1:57 PM [facility] [address] [place], [place] [address] [phone]

## 2019-09-15 NOTE — Telephone Encounter (Signed)
Pt will be having in office visit. Per wife from telephone call pt is doing fine with no symptoms. LEft vm that appt is at 1030, left on home number tried to call wife cell phone it did not ring. Remind on vm check in time is 1000am.

## 2019-09-15 NOTE — Telephone Encounter (Addendum)
Per Dr Clydene Fake RN, called wife to advise patient will be seen today. Advised he can be seen if he arrives late, other RN left her message earlier today. Left office # .

## 2019-09-15 NOTE — Patient Instructions (Signed)
I had a long discussion with the patient regarding his painful paresthesias from his small fiber neuropathy from sarcoid which appears to be stable paresthesias seem to have responded to gabapentin but he has some good and bad days.  I recommend he continue taking 800 mg of gabapentin 4 times daily and on bad days he can take an extra fifth dose.  He was given a refill.  He will return for follow-up in the future in 6 months with my nurse practitioner call earlier if necessary

## 2019-09-23 DIAGNOSIS — C61 Malignant neoplasm of prostate: Secondary | ICD-10-CM | POA: Diagnosis not present

## 2019-09-23 DIAGNOSIS — N393 Stress incontinence (female) (male): Secondary | ICD-10-CM | POA: Diagnosis not present

## 2019-09-29 DIAGNOSIS — B351 Tinea unguium: Secondary | ICD-10-CM | POA: Diagnosis not present

## 2019-09-29 DIAGNOSIS — M79676 Pain in unspecified toe(s): Secondary | ICD-10-CM | POA: Diagnosis not present

## 2019-10-05 ENCOUNTER — Other Ambulatory Visit: Payer: Self-pay | Admitting: Family

## 2019-10-22 DIAGNOSIS — N393 Stress incontinence (female) (male): Secondary | ICD-10-CM | POA: Diagnosis not present

## 2019-10-22 DIAGNOSIS — R3912 Poor urinary stream: Secondary | ICD-10-CM | POA: Diagnosis not present

## 2019-10-22 DIAGNOSIS — M6281 Muscle weakness (generalized): Secondary | ICD-10-CM | POA: Diagnosis not present

## 2019-10-22 DIAGNOSIS — M6289 Other specified disorders of muscle: Secondary | ICD-10-CM | POA: Diagnosis not present

## 2019-10-22 DIAGNOSIS — M62838 Other muscle spasm: Secondary | ICD-10-CM | POA: Diagnosis not present

## 2019-10-22 DIAGNOSIS — R339 Retention of urine, unspecified: Secondary | ICD-10-CM | POA: Diagnosis not present

## 2019-11-09 ENCOUNTER — Ambulatory Visit: Payer: BC Managed Care – PPO | Admitting: Family Medicine

## 2019-11-11 DIAGNOSIS — M6281 Muscle weakness (generalized): Secondary | ICD-10-CM | POA: Diagnosis not present

## 2019-11-11 DIAGNOSIS — R3912 Poor urinary stream: Secondary | ICD-10-CM | POA: Diagnosis not present

## 2019-11-11 DIAGNOSIS — M62838 Other muscle spasm: Secondary | ICD-10-CM | POA: Diagnosis not present

## 2019-11-11 DIAGNOSIS — R339 Retention of urine, unspecified: Secondary | ICD-10-CM | POA: Diagnosis not present

## 2019-11-11 DIAGNOSIS — N393 Stress incontinence (female) (male): Secondary | ICD-10-CM | POA: Diagnosis not present

## 2019-11-12 ENCOUNTER — Ambulatory Visit: Payer: BC Managed Care – PPO | Admitting: Family Medicine

## 2019-11-17 DIAGNOSIS — M25511 Pain in right shoulder: Secondary | ICD-10-CM | POA: Diagnosis not present

## 2019-11-17 DIAGNOSIS — M7531 Calcific tendinitis of right shoulder: Secondary | ICD-10-CM | POA: Diagnosis not present

## 2019-11-26 DIAGNOSIS — M62838 Other muscle spasm: Secondary | ICD-10-CM | POA: Diagnosis not present

## 2019-11-26 DIAGNOSIS — M6281 Muscle weakness (generalized): Secondary | ICD-10-CM | POA: Diagnosis not present

## 2019-11-26 DIAGNOSIS — N393 Stress incontinence (female) (male): Secondary | ICD-10-CM | POA: Diagnosis not present

## 2019-12-07 ENCOUNTER — Other Ambulatory Visit: Payer: Medicare Other

## 2019-12-07 ENCOUNTER — Other Ambulatory Visit: Payer: Self-pay

## 2019-12-07 ENCOUNTER — Other Ambulatory Visit: Payer: Self-pay | Admitting: Family Medicine

## 2019-12-07 DIAGNOSIS — E782 Mixed hyperlipidemia: Secondary | ICD-10-CM

## 2019-12-07 DIAGNOSIS — D8689 Sarcoidosis of other sites: Secondary | ICD-10-CM

## 2019-12-07 DIAGNOSIS — K219 Gastro-esophageal reflux disease without esophagitis: Secondary | ICD-10-CM | POA: Diagnosis not present

## 2019-12-07 DIAGNOSIS — E785 Hyperlipidemia, unspecified: Secondary | ICD-10-CM | POA: Diagnosis not present

## 2019-12-07 DIAGNOSIS — D869 Sarcoidosis, unspecified: Secondary | ICD-10-CM

## 2019-12-07 NOTE — Progress Notes (Signed)
Placed lab orders for the patient 

## 2019-12-08 DIAGNOSIS — L03031 Cellulitis of right toe: Secondary | ICD-10-CM | POA: Diagnosis not present

## 2019-12-08 DIAGNOSIS — B351 Tinea unguium: Secondary | ICD-10-CM | POA: Diagnosis not present

## 2019-12-08 DIAGNOSIS — L03032 Cellulitis of left toe: Secondary | ICD-10-CM | POA: Diagnosis not present

## 2019-12-08 LAB — CBC WITH DIFFERENTIAL/PLATELET
Basophils Absolute: 0.1 10*3/uL (ref 0.0–0.2)
Basos: 1 %
EOS (ABSOLUTE): 0.2 10*3/uL (ref 0.0–0.4)
Eos: 3 %
Hematocrit: 45 % (ref 37.5–51.0)
Hemoglobin: 15.4 g/dL (ref 13.0–17.7)
Immature Grans (Abs): 0 10*3/uL (ref 0.0–0.1)
Immature Granulocytes: 0 %
Lymphocytes Absolute: 1.9 10*3/uL (ref 0.7–3.1)
Lymphs: 25 %
MCH: 28.8 pg (ref 26.6–33.0)
MCHC: 34.2 g/dL (ref 31.5–35.7)
MCV: 84 fL (ref 79–97)
Monocytes Absolute: 0.8 10*3/uL (ref 0.1–0.9)
Monocytes: 11 %
Neutrophils Absolute: 4.5 10*3/uL (ref 1.4–7.0)
Neutrophils: 60 %
Platelets: 163 10*3/uL (ref 150–450)
RBC: 5.35 x10E6/uL (ref 4.14–5.80)
RDW: 13.3 % (ref 11.6–15.4)
WBC: 7.4 10*3/uL (ref 3.4–10.8)

## 2019-12-08 LAB — LIPID PANEL
Chol/HDL Ratio: 4.3 ratio (ref 0.0–5.0)
Cholesterol, Total: 177 mg/dL (ref 100–199)
HDL: 41 mg/dL (ref >39)
LDL Chol Calc (NIH): 112 mg/dL — ABNORMAL HIGH (ref 0–99)
Triglycerides: 132 mg/dL (ref 0–149)
VLDL Cholesterol Cal: 24 mg/dL (ref 5–40)

## 2019-12-08 LAB — CMP14+EGFR
ALT: 11 IU/L (ref 0–44)
AST: 13 IU/L (ref 0–40)
Albumin/Globulin Ratio: 2 (ref 1.2–2.2)
Albumin: 4.5 g/dL (ref 3.8–4.8)
Alkaline Phosphatase: 95 IU/L (ref 39–117)
BUN/Creatinine Ratio: 14 (ref 10–24)
BUN: 16 mg/dL (ref 8–27)
Bilirubin Total: 0.6 mg/dL (ref 0.0–1.2)
CO2: 26 mmol/L (ref 20–29)
Calcium: 10.2 mg/dL (ref 8.6–10.2)
Chloride: 104 mmol/L (ref 96–106)
Creatinine, Ser: 1.15 mg/dL (ref 0.76–1.27)
GFR calc Af Amer: 78 mL/min/{1.73_m2} (ref >59)
GFR calc non Af Amer: 68 mL/min/{1.73_m2} (ref >59)
Globulin, Total: 2.2 g/dL (ref 1.5–4.5)
Glucose: 81 mg/dL (ref 65–99)
Potassium: 5.5 mmol/L — ABNORMAL HIGH (ref 3.5–5.2)
Sodium: 143 mmol/L (ref 134–144)
Total Protein: 6.7 g/dL (ref 6.0–8.5)

## 2019-12-08 LAB — TSH: TSH: 2.96 u[IU]/mL (ref 0.450–4.500)

## 2019-12-10 ENCOUNTER — Ambulatory Visit (INDEPENDENT_AMBULATORY_CARE_PROVIDER_SITE_OTHER): Payer: BC Managed Care – PPO | Admitting: Family Medicine

## 2019-12-10 ENCOUNTER — Encounter: Payer: Self-pay | Admitting: Family Medicine

## 2019-12-10 ENCOUNTER — Other Ambulatory Visit: Payer: Self-pay

## 2019-12-10 VITALS — BP 121/79 | HR 64 | Temp 98.9°F | Ht 70.0 in | Wt 170.0 lb

## 2019-12-10 DIAGNOSIS — E782 Mixed hyperlipidemia: Secondary | ICD-10-CM

## 2019-12-10 DIAGNOSIS — K219 Gastro-esophageal reflux disease without esophagitis: Secondary | ICD-10-CM

## 2019-12-10 NOTE — Progress Notes (Signed)
BP 121/79   Pulse 64   Temp 98.9 F (37.2 C)   Ht 5' 10" (1.778 m)   Wt 170 lb (77.1 kg)   SpO2 98%   BMI 24.39 kg/m    Subjective:   Patient ID: Philip Hernandez, male    DOB: [birth year], 64 y.o.   MRN: [id number]  HPI: Philip Hernandez is a 64 y.o. male presenting on 12/10/2019 for Medical Management of Chronic Issues (93m and Hyperlipidemia   HPI Hyperlipidemia Patient is coming in for recheck of his hyperlipidemia. The patient is currently taking no medication and cholesterol looks a lot better on this check.. They deny any issues with myalgias or history of liver damage from it. They deny any focal numbness or weakness or chest pain.   GERD Patient is currently on omeprazole.  She denies any major symptoms or abdominal pain or belching or burping. She denies any blood in her stool or lightheadedness or dizziness.   Patient has chronic breathing issues from sarcoidosis.  He recently had Covid a few months ago and is still recovering from that.  He says he still has some shortness of breath from that but it is improving.  Relevant past medical, surgical, family and social history reviewed and updated as indicated. Interim medical history since our last visit reviewed. Allergies and medications reviewed and updated.  Review of Systems  Constitutional: Negative for chills and fever.  Respiratory: Negative for shortness of breath and wheezing.   Cardiovascular: Negative for chest pain and leg swelling.  Musculoskeletal: Negative for back pain and gait problem.  Skin: Negative for rash.  Neurological: Negative for dizziness, weakness and light-headedness.  All other systems reviewed and are negative.   Per HPI unless specifically indicated above   Allergies as of 12/10/2019      Reactions   Corn-containing Products       Medication List       Accurate as of December 10, 2019  4:08 PM. If you have any questions, ask your nurse or doctor.        STOP taking these medications    predniSONE 10 MG (48) Tbpk tablet Commonly known as: STERAPRED UNI-PAK 48 TAB Stopped by: JFransisca KaufmannDettinger, MD   predniSONE 10 MG tablet Commonly known as: DELTASONE Stopped by: JFransisca KaufmannDettinger, MD     TAKE these medications   acetaminophen 500 MG tablet Commonly known as: TYLENOL Take 500 mg by mouth every 6 (six) hours as needed.   AIRBORNE PO Take by mouth daily.   albuterol 108 (90 Base) MCG/ACT inhaler Commonly known as: VENTOLIN HFA Inhale 2 puffs into the lungs every 6 (six) hours as needed for wheezing or shortness of breath.   benzonatate 200 MG capsule Commonly known as: TESSALON TAKE ONE (1) CAPSULE THREE (3) TIMES EACH DAY AS NEEDED   cetirizine 10 MG tablet Commonly known as: ZYRTEC Take 10 mg by mouth daily.   cholecalciferol 1000 units tablet Commonly known as: VITAMIN D Take 1,000 Units by mouth daily.   gabapentin 800 MG tablet Commonly known as: NEURONTIN TAKE 1 TABLET BY MOUTH 4  TIMES DAILY   omeprazole 40 MG capsule Commonly known as: PRILOSEC Take 30- 60 min before your first and last meals of the day        Objective:   BP 121/79   Pulse 64   Temp 98.9 F (37.2 C)   Ht 5' 10" (1.778 m)   Wt 170 lb (77.1 kg)  SpO2 98%   BMI 24.39 kg/m   Wt Readings from Last 3 Encounters:  12/10/19 170 lb (77.1 kg)  09/15/19 170 lb (77.1 kg)  05/22/19 177 lb (80.3 kg)    Physical Exam Vitals and nursing note reviewed.  Constitutional:      General: He is not in acute distress.    Appearance: He is well-developed. He is not diaphoretic.  Eyes:     General: No scleral icterus.    Conjunctiva/sclera: Conjunctivae normal.  Neck:     Thyroid: No thyromegaly.  Cardiovascular:     Rate and Rhythm: Normal rate and regular rhythm.     Heart sounds: Normal heart sounds. No murmur.  Pulmonary:     Effort: Pulmonary effort is normal. No respiratory distress.     Breath sounds: Normal breath sounds. No wheezing.  Musculoskeletal:         General: Tenderness (Tenderness in right lateral shoulder especially with overhead range of motion.,  He is seeing orthopedic for this) present. Normal range of motion.     Cervical back: Neck supple.  Lymphadenopathy:     Cervical: No cervical adenopathy.  Skin:    General: Skin is warm and dry.     Findings: No rash.  Neurological:     Mental Status: He is alert and oriented to person, place, and time.     Coordination: Coordination normal.  Psychiatric:        Behavior: Behavior normal.     Results for orders placed or performed in visit on 12/07/19  TSH  Result Value Ref Range   TSH 2.960 0.450 - 4.500 uIU/mL  Lipid panel  Result Value Ref Range   Cholesterol, Total 177 100 - 199 mg/dL   Triglycerides 132 0 - 149 mg/dL   HDL 41 >39 mg/dL   VLDL Cholesterol Cal 24 5 - 40 mg/dL   LDL Chol Calc (NIH) 112 (H) 0 - 99 mg/dL   Chol/HDL Ratio 4.3 0.0 - 5.0 ratio  CMP14+EGFR  Result Value Ref Range   Glucose 81 65 - 99 mg/dL   BUN 16 8 - 27 mg/dL   Creatinine, Ser 1.15 0.76 - 1.27 mg/dL   GFR calc non Af Amer 68 >59 mL/min/1.73   GFR calc Af Amer 78 >59 mL/min/1.73   BUN/Creatinine Ratio 14 10 - 24   Sodium 143 134 - 144 mmol/L   Potassium 5.5 (H) 3.5 - 5.2 mmol/L   Chloride 104 96 - 106 mmol/L   CO2 26 20 - 29 mmol/L   Calcium 10.2 8.6 - 10.2 mg/dL   Total Protein 6.7 6.0 - 8.5 g/dL   Albumin 4.5 3.8 - 4.8 g/dL   Globulin, Total 2.2 1.5 - 4.5 g/dL   Albumin/Globulin Ratio 2.0 1.2 - 2.2   Bilirubin Total 0.6 0.0 - 1.2 mg/dL   Alkaline Phosphatase 95 39 - 117 IU/L   AST 13 0 - 40 IU/L   ALT 11 0 - 44 IU/L  CBC with Differential/Platelet  Result Value Ref Range   WBC 7.4 3.4 - 10.8 x10E3/uL   RBC 5.35 4.14 - 5.80 x10E6/uL   Hemoglobin 15.4 13.0 - 17.7 g/dL   Hematocrit 45.0 37.5 - 51.0 %   MCV 84 79 - 97 fL   MCH 28.8 26.6 - 33.0 pg   MCHC 34.2 31.5 - 35.7 g/dL   RDW 13.3 11.6 - 15.4 %   Platelets 163 150 - 450 x10E3/uL   Neutrophils 60 Not Estab. %  Lymphs 25  Not Estab. %   Monocytes 11 Not Estab. %   Eos 3 Not Estab. %   Basos 1 Not Estab. %   Neutrophils Absolute 4.5 1.4 - 7.0 x10E3/uL   Lymphocytes Absolute 1.9 0.7 - 3.1 x10E3/uL   Monocytes Absolute 0.8 0.1 - 0.9 x10E3/uL   EOS (ABSOLUTE) 0.2 0.0 - 0.4 x10E3/uL   Basophils Absolute 0.1 0.0 - 0.2 x10E3/uL   Immature Granulocytes 0 Not Estab. %   Immature Grans (Abs) 0.0 0.0 - 0.1 x10E3/uL    Assessment & Plan:   Problem List Items Addressed This Visit      Digestive   GERD     Other   HLD (hyperlipidemia) - Primary      Continue current medication, omeprazole doubled by his gastroenterologist seems to be doing better. Follow up plan: Return in about 6 months (around 06/11/2020), or if symptoms worsen or fail to improve, for Hyperlipidemia GERD.  Counseling provided for all of the vaccine components No orders of the defined types were placed in this encounter.   [name], MD [facility] 12/10/2019, 4:08 PM

## 2019-12-16 DIAGNOSIS — N393 Stress incontinence (female) (male): Secondary | ICD-10-CM | POA: Diagnosis not present

## 2019-12-16 DIAGNOSIS — M62838 Other muscle spasm: Secondary | ICD-10-CM | POA: Diagnosis not present

## 2019-12-16 DIAGNOSIS — M6281 Muscle weakness (generalized): Secondary | ICD-10-CM | POA: Diagnosis not present

## 2019-12-29 DIAGNOSIS — M25511 Pain in right shoulder: Secondary | ICD-10-CM | POA: Diagnosis not present

## 2019-12-29 DIAGNOSIS — M7531 Calcific tendinitis of right shoulder: Secondary | ICD-10-CM | POA: Diagnosis not present

## 2020-01-05 DIAGNOSIS — M62838 Other muscle spasm: Secondary | ICD-10-CM | POA: Diagnosis not present

## 2020-01-05 DIAGNOSIS — M6281 Muscle weakness (generalized): Secondary | ICD-10-CM | POA: Diagnosis not present

## 2020-01-05 DIAGNOSIS — N393 Stress incontinence (female) (male): Secondary | ICD-10-CM | POA: Diagnosis not present

## 2020-01-09 DIAGNOSIS — M25511 Pain in right shoulder: Secondary | ICD-10-CM | POA: Diagnosis not present

## 2020-01-15 DIAGNOSIS — M25511 Pain in right shoulder: Secondary | ICD-10-CM | POA: Diagnosis not present

## 2020-01-15 DIAGNOSIS — M7531 Calcific tendinitis of right shoulder: Secondary | ICD-10-CM | POA: Diagnosis not present

## 2020-01-15 DIAGNOSIS — M67813 Other specified disorders of tendon, right shoulder: Secondary | ICD-10-CM | POA: Diagnosis not present

## 2020-01-29 ENCOUNTER — Ambulatory Visit: Payer: Medicare Other | Admitting: Physical Therapy

## 2020-02-02 ENCOUNTER — Other Ambulatory Visit: Payer: Self-pay

## 2020-02-02 ENCOUNTER — Ambulatory Visit: Payer: BC Managed Care – PPO | Attending: Orthopedic Surgery | Admitting: Physical Therapy

## 2020-02-02 ENCOUNTER — Encounter: Payer: Self-pay | Admitting: Physical Therapy

## 2020-02-02 DIAGNOSIS — M6281 Muscle weakness (generalized): Secondary | ICD-10-CM

## 2020-02-02 DIAGNOSIS — M25511 Pain in right shoulder: Secondary | ICD-10-CM | POA: Insufficient documentation

## 2020-02-02 NOTE — Therapy (Signed)
[facility] [place], [place], [address] Phone: [phone]   Fax:  [phone]  Physical Therapy Evaluation  Patient Details  Name: Philip Hernandez MRN: [id number] Date of Birth: September 12, 1956 Referring Provider (PT): Victorino December MD   Encounter Date: 02/02/2020  PT End of Session - 02/02/20 1126    Visit Number  1    Number of Visits  12    Date for PT Re-Evaluation  03/01/20    Authorization Type  FOTO.    PT Start Time  1030    PT Stop Time  1122    PT Time Calculation (min)  52 min    Activity Tolerance  Patient tolerated treatment well    Behavior During Therapy  WFL for tasks assessed/performed       Past Medical History:  Diagnosis Date  . Allergy   . Arthritis   . Cataract   . Colon polyp   . Colon polyps 2015  . Cough    chronic sinus issues, GERD with poor dietary control and Pulmonary Sarcoid.   Marland Kitchen GERD (gastroesophageal reflux disease) Jan 2012   [name]. Bx proven.  Deve abd pain in Jan 2012 following high dose steroids for sarcoid. s/p EGD and improvement in symptoms following PPI  . Hypogonadism male   . Kidney stone   . Malignant neoplasm of prostate [facility]) dec 2010   [name]. s/p surgery dec 2010, hx of rnormal PSA  since then  . Other and unspecified mycoses   . Painful respiration   . Peripheral neuropathy   . Prostate cancer ([place])    reoccurence (getting radiation 01-14-17 start).  . Sarcoidosis dec 2011   stage 2 pulmonary, neck nodes, splenic granuloma  . Skin cancer   . Swelling, mass, or lump in head and neck     Past Surgical History:  Procedure Laterality Date  . HERNIA REPAIR  13 yrs ago  . HERNIA REPAIR    . LYMPH NODE BIOPSY  05/2010, 07/2010  . nasal polyps  1984  . NASAL SINUS SURGERY  2013  . PROSTATE SURGERY  2010   radiation 2018  . SHOULDER SURGERY Right 2009, 2010   x 2  . SKIN CANCER EXCISION  11/16/14    There were no vitals filed for this  visit.   Subjective Assessment - 02/02/20 1105    Subjective  COVID-19 screen performed prior to patient entering clinic.  The patient presents to the clinic today with c/o right shoulder pain that came on about 5 months ago for no apparent reason.  He received an injection which did provide him with some relief.  His pain is a 7/10 today and goes to higher levels with certain right shoulder movements and lifting objects.  He has been golfing some.  He states his MRI revealed tendonitis.    Pertinent History  Active prostate cancer (he states his PSA levels have been staying low though), two previous right shoulder surgeries, peripheral neuropathy, hernia repair.    Patient Stated Goals  Use right UE wihtout pain.    Currently in Pain?  Yes    Pain Score  7     Pain Location  Shoulder    Pain Orientation  Right    Pain Descriptors / Indicators  Aching;Sharp    Pain Type  Acute pain    Pain Onset  More than a month ago    Pain Frequency  Constant    Aggravating Factors  See above.    Pain Relieving Factors  Rest.         [facility] PT Assessment - 02/02/20 0001      Assessment   Medical Diagnosis  Tendonitis of right shoulder.    Referring Provider (PT)  Victorino December MD    Onset Date/Surgical Date  --   ~5 months.     Precautions   Precautions  --   NO ultrasound.     Restrictions   Weight Bearing Restrictions  No      Balance Screen   Has the patient fallen in the past 6 months  No    Has the patient had a decrease in activity level because of a fear of falling?   No    Is the patient reluctant to leave their home because of a fear of falling?   No      Home Environment   Living Environment  Private residence      Prior Function   Level of Independence  Independent      Observation/Other Assessments   Focus on Therapeutic Outcomes (FOTO)   65% limitation.      Posture/Postural Control   Posture/Postural Control  Postural limitations    Postural Limitations  Rounded  Shoulders;Forward head      ROM / Strength   AROM / PROM / Strength  AROM;Strength      AROM   Overall AROM Comments  Slow antigravity flexion and ER is full.  Behind back to L4.      Strength   Overall Strength Comments  Right shoulder abduction graded grossly at 3+/5 limited at least in part due to pain.  ER/IR is 4/5.      Palpation   Palpation comment  Tender to palpation at right acromial ridge with reffered pain to right middle       Special Tests   Other special tests  C/o of right shoulder pain with Impingement testing.  (-) Drop Arm test.                  Objective measurements completed on examination: See above findings.      [facility] Adult PT Treatment/Exercise - 02/02/20 0001      Modalities   Modalities  Electrical Stimulation;Vasopneumatic      Electrical Stimulation   Electrical Stimulation Location  Right shoulder.    Electrical Stimulation Action  IFC    Electrical Stimulation Parameters  80-150 Hz at 100% scan x 20 minutes.    Electrical Stimulation Goals  Pain             PT Education - 02/02/20 1126    Education Details  Short duration ice massage.    Person(s) Educated  Patient    Methods  Explanation          PT Long Term Goals - 02/02/20 1154      PT LONG TERM GOAL #1   Title  Independent with a HEP.    Time  4    Period  Weeks    Status  New      PT LONG TERM GOAL #2   Title  Increase right shoulder strength to a solid 4+/5 to increase stability for performance of functional activities.    Time  4    Period  Weeks    Status  New      PT LONG TERM GOAL #3   Title  Perform ADL's with pain not > 3/10.  Time  4    Period  Weeks    Status  New             Plan - 02/02/20 1149    Clinical Impression Statement  The patient presents to OPPT with c/o right shoulder pain over the last 5 months.  He continues to have pain with right shoulder movements and lifting.  An injection was helpful.  He is tender to  palpation over his right acromial ridge area.  He has pain with Impingement testing of his right shoulder.  Patient will benefit from skilled physical therapy intervention to address deficits and pain.    Personal Factors and Comorbidities  Comorbidity 1;Comorbidity 2;Comorbidity 3+    Comorbidities  Active prostate cancer (he states his PSA levels have been staying low though), two previous right shoulder surgeries, peripheral neuropathy, hernia repair.    Examination-Activity Limitations  Reach Overhead;Other    Examination-Participation Restrictions  Other    Stability/Clinical Decision Making  Evolving/Moderate complexity    PT Frequency  3x / week    PT Duration  4 weeks    PT Treatment/Interventions  ADLs/Self Care Home Management;Cryotherapy;Electrical Stimulation;Moist Heat;Iontophoresis 4mg /ml Dexamethasone;Therapeutic activities;Therapeutic exercise;Manual techniques;Patient/family education;Passive range of motion;Dry needling;Joint Manipulations;Vasopneumatic Device    PT Next Visit Plan  E'stim and vasopneumatic, STW/M, AAROM progressing to PRE's (ie:  Yellow theraband RW4).    Consulted and Agree with Plan of Care  Patient       Patient will benefit from skilled therapeutic intervention in order to improve the following deficits and impairments:  Pain, Decreased activity tolerance, Decreased strength, Decreased range of motion, Impaired UE functional use  Visit Diagnosis: Acute pain of right shoulder - Plan: PT plan of care cert/re-cert  Muscle weakness (generalized) - Plan: PT plan of care cert/re-cert     Problem List Patient Active Problem List   Diagnosis Date Noted  . Thrombocytopenia ([place]) 07/04/2018  . Family history of malignant neoplasm of gastrointestinal tract 12/23/2013  . Colon polyps   . HLD (hyperlipidemia) 03/26/2013  . Hyperuricemia 03/26/2013  . Pain in left hip 02/25/2013  . Neuropathy 02/25/2013  . Peripheral neuropathy in sarcoidosis 12/08/2012  .  Sarcoidosis of lung ([place]) 09/28/2010  . ADENOCARCINOMA, PROSTATE 09/28/2010  . GERD 09/28/2010  . DOE (dyspnea on exertion) 07/04/2009    Kenny Rea, [place] MPT 02/02/2020, 11:57 AM  [facility] [address] [place], [place], [address] Phone: [phone]   Fax:  [phone]  Name: Philip Hernandez MRN: [id number] Date of Birth: [birth year]

## 2020-02-04 ENCOUNTER — Other Ambulatory Visit: Payer: Self-pay

## 2020-02-04 ENCOUNTER — Ambulatory Visit: Payer: BC Managed Care – PPO | Admitting: *Deleted

## 2020-02-04 DIAGNOSIS — M25511 Pain in right shoulder: Secondary | ICD-10-CM | POA: Diagnosis not present

## 2020-02-04 DIAGNOSIS — M6281 Muscle weakness (generalized): Secondary | ICD-10-CM | POA: Diagnosis not present

## 2020-02-04 NOTE — Therapy (Signed)
[facility] [place], [place], [address] Phone: [phone]   Fax:  [phone]  Physical Therapy Treatment  Patient Details  Name: Philip Hernandez MRN: [id number] Date of Birth: 1955/11/06 Referring Provider (PT): Victorino December MD   Encounter Date: 02/04/2020  PT End of Session - 02/04/20 1617    Visit Number  2    Number of Visits  12    Date for PT Re-Evaluation  03/01/20    PT Start Time  1600    PT Stop Time  1650    PT Time Calculation (min)  50 min       Past Medical History:  Diagnosis Date  . Allergy   . Arthritis   . Cataract   . Colon polyp   . Colon polyps 2015  . Cough    chronic sinus issues, GERD with poor dietary control and Pulmonary Sarcoid.   Marland Kitchen GERD (gastroesophageal reflux disease) Jan 2012   [name]. Bx proven.  Deve abd pain in Jan 2012 following high dose steroids for sarcoid. s/p EGD and improvement in symptoms following PPI  . Hypogonadism male   . Kidney stone   . Malignant neoplasm of prostate [facility]) dec 2010   [name]. s/p surgery dec 2010, hx of rnormal PSA  since then  . Other and unspecified mycoses   . Painful respiration   . Peripheral neuropathy   . Prostate cancer ([place])    reoccurence (getting radiation 01-14-17 start).  . Sarcoidosis dec 2011   stage 2 pulmonary, neck nodes, splenic granuloma  . Skin cancer   . Swelling, mass, or lump in head and neck     Past Surgical History:  Procedure Laterality Date  . HERNIA REPAIR  13 yrs ago  . HERNIA REPAIR    . LYMPH NODE BIOPSY  05/2010, 07/2010  . nasal polyps  1984  . NASAL SINUS SURGERY  2013  . PROSTATE SURGERY  2010   radiation 2018  . SHOULDER SURGERY Right 2009, 2010   x 2  . SKIN CANCER EXCISION  11/16/14    There were no vitals filed for this visit.                     [facility] Adult PT Treatment/Exercise - 02/04/20 0001      Exercises   Exercises  Shoulder      Shoulder Exercises:  Standing   Other Standing Exercises  Yellow tband: RW 4 2x 10-15 reps each with modified ( low-pain) ROM      Modalities   Modalities  Electrical Stimulation;Vasopneumatic      Electrical Stimulation   Electrical Stimulation Location  Right shoulder.    Electrical Stimulation Action  IFC    Electrical Stimulation Parameters  80-150hz  x 15 mins    Electrical Stimulation Goals  Pain      Vasopneumatic   Number Minutes Vasopneumatic   15 minutes    Vasopnuematic Location   Shoulder    Vasopneumatic Pressure  Low    Vasopneumatic Temperature   36      Manual Therapy   Manual Therapy  Soft tissue mobilization    Soft tissue mobilization  STW/ IASTM to RT  Utrap, posterior cuff and inferior-acromion area                  PT Long Term Goals - 02/02/20 1154      PT LONG TERM GOAL #1   Title  Independent with a HEP.    Time  4    Period  Weeks    Status  New      PT LONG TERM GOAL #2   Title  Increase right shoulder strength to a solid 4+/5 to increase stability for performance of functional activities.    Time  4    Period  Weeks    Status  New      PT LONG TERM GOAL #3   Title  Perform ADL's with pain not > 3/10.    Time  4    Period  Weeks    Status  New            Plan - 02/04/20 1706    Clinical Impression Statement  Pt arrived today doing fair, but with pain in RT shldr. He was able to perform light yellow tbandRW 4 exs with modified motion to maintain low-pain levels. He did well with STW and had notable tenderness in postrior cuff, Utrap, as well as RT acromial ridge. Normal modality response today    Comorbidities  Active prostate cancer (he states his PSA levels have been staying low though), two previous right shoulder surgeries, peripheral neuropathy, hernia repair.    Examination-Activity Limitations  Reach Overhead;Other    Examination-Participation Restrictions  Other    Stability/Clinical Decision Making  Evolving/Moderate complexity    PT  Frequency  3x / week    PT Duration  4 weeks    PT Treatment/Interventions  ADLs/Self Care Home Management;Cryotherapy;Electrical Stimulation;Moist Heat;Iontophoresis 4mg /ml Dexamethasone;Therapeutic activities;Therapeutic exercise;Manual techniques;Patient/family education;Passive range of motion;Dry needling;Joint Manipulations;Vasopneumatic Device    PT Next Visit Plan  E'stim and vasopneumatic, STW/M, AAROM progressing to PRE's (ie:  Yellow theraband RW4).       Patient will benefit from skilled therapeutic intervention in order to improve the following deficits and impairments:  Pain, Decreased activity tolerance, Decreased strength, Decreased range of motion, Impaired UE functional use  Visit Diagnosis: Acute pain of right shoulder  Muscle weakness (generalized)     Problem List Patient Active Problem List   Diagnosis Date Noted  . Thrombocytopenia ([place]) 07/04/2018  . Family history of malignant neoplasm of gastrointestinal tract 12/23/2013  . Colon polyps   . HLD (hyperlipidemia) 03/26/2013  . Hyperuricemia 03/26/2013  . Pain in left hip 02/25/2013  . Neuropathy 02/25/2013  . Peripheral neuropathy in sarcoidosis 12/08/2012  . Sarcoidosis of lung ([place]) 09/28/2010  . ADENOCARCINOMA, PROSTATE 09/28/2010  . GERD 09/28/2010  . DOE (dyspnea on exertion) 07/04/2009    RAMSEUR,CHRIS, PTA 02/04/2020, 5:13 PM  [facility] [place], [place], [address] Phone: [phone]   Fax:  [phone]  Name: Philip Hernandez MRN: [id number] Date of Birth: December 02, 1955

## 2020-02-08 ENCOUNTER — Other Ambulatory Visit: Payer: Self-pay

## 2020-02-08 ENCOUNTER — Ambulatory Visit: Payer: BC Managed Care – PPO | Admitting: Physical Therapy

## 2020-02-08 DIAGNOSIS — M6281 Muscle weakness (generalized): Secondary | ICD-10-CM | POA: Diagnosis not present

## 2020-02-08 DIAGNOSIS — M25511 Pain in right shoulder: Secondary | ICD-10-CM | POA: Diagnosis not present

## 2020-02-08 NOTE — Therapy (Signed)
[facility] [place], [place], [address] Phone: [phone]   Fax:  [phone]  Physical Therapy Treatment  Patient Details  Name: Philip Hernandez MRN: [id number] Date of Birth: 06/16/1956 Referring Provider (PT): Victorino December MD   Encounter Date: 02/08/2020  PT End of Session - 02/08/20 1200    Visit Number  3    Number of Visits  12    Date for PT Re-Evaluation  03/01/20    Authorization Type  FOTO.    PT Start Time  1030    PT Stop Time  1123    PT Time Calculation (min)  53 min    Activity Tolerance  Patient tolerated treatment well    Behavior During Therapy  WFL for tasks assessed/performed       Past Medical History:  Diagnosis Date  . Allergy   . Arthritis   . Cataract   . Colon polyp   . Colon polyps 2015  . Cough    chronic sinus issues, GERD with poor dietary control and Pulmonary Sarcoid.   Marland Kitchen GERD (gastroesophageal reflux disease) Jan 2012   [name]. Bx proven.  Deve abd pain in Jan 2012 following high dose steroids for sarcoid. s/p EGD and improvement in symptoms following PPI  . Hypogonadism male   . Kidney stone   . Malignant neoplasm of prostate [facility]) dec 2010   [name]. s/p surgery dec 2010, hx of rnormal PSA  since then  . Other and unspecified mycoses   . Painful respiration   . Peripheral neuropathy   . Prostate cancer ([place])    reoccurence (getting radiation 01-14-17 start).  . Sarcoidosis dec 2011   stage 2 pulmonary, neck nodes, splenic granuloma  . Skin cancer   . Swelling, mass, or lump in head and neck     Past Surgical History:  Procedure Laterality Date  . HERNIA REPAIR  13 yrs ago  . HERNIA REPAIR    . LYMPH NODE BIOPSY  05/2010, 07/2010  . nasal polyps  1984  . NASAL SINUS SURGERY  2013  . PROSTATE SURGERY  2010   radiation 2018  . SHOULDER SURGERY Right 2009, 2010   x 2  . SKIN CANCER EXCISION  11/16/14    There were no vitals filed for this  visit.  Subjective Assessment - 02/08/20 1157    Subjective  COVID-19 screen performed prior to patient entering clinic.  Last treatment helped.  Shoulder hurting this morning.    Pertinent History  Active prostate cancer (he states his PSA levels have been staying low though), two previous right shoulder surgeries, peripheral neuropathy, hernia repair.    Patient Stated Goals  Use right UE wihtout pain.    Currently in Pain?  Yes    Pain Score  7     Pain Location  Shoulder    Pain Orientation  Right    Pain Descriptors / Indicators  Aching;Sharp    Pain Type  Acute pain    Pain Onset  More than a month ago                        [facility] Adult PT Treatment/Exercise - 02/08/20 0001      Exercises   Exercises  Shoulder      Shoulder Exercises: Standing   Other Standing Exercises  Yellow theraband to fatigue all directions.      Shoulder Exercises: ROM/Strengthening   UBE (Upper Arm  Bike)  120 RPM's x 8 minutes (4 minutes forward and 4 minutes backward).      Acupuncturist Location  RT shoulder.    Electrical Stimulation Action  IFC    Electrical Stimulation Parameters  80-150 Hz x 20 minutes.    Electrical Stimulation Goals  Pain      Vasopneumatic   Number Minutes Vasopneumatic   20 minutes    Vasopnuematic Location   --   RT shoulder.   Vasopneumatic Pressure  Low      Manual Therapy   Manual Therapy  Soft tissue mobilization    Soft tissue mobilization  STW/M/IASTM x 11 minutes to patient's right shoulder x 11 minutes.                  PT Long Term Goals - 02/02/20 1154      PT LONG TERM GOAL #1   Title  Independent with a HEP.    Time  4    Period  Weeks    Status  New      PT LONG TERM GOAL #2   Title  Increase right shoulder strength to a solid 4+/5 to increase stability for performance of functional activities.    Time  4    Period  Weeks    Status  New      PT LONG TERM GOAL #3   Title   Perform ADL's with pain not > 3/10.    Time  4    Period  Weeks    Status  New            Plan - 02/08/20 1201    Clinical Impression Statement  Patient did well after last treatment.  Shoulder hurting this morning.  He did very well with RW4 exercises today.  Provided patient with yellow theraband to perform at home.    Personal Factors and Comorbidities  Comorbidity 1;Comorbidity 2;Comorbidity 3+    Comorbidities  Active prostate cancer (he states his PSA levels have been staying low though), two previous right shoulder surgeries, peripheral neuropathy, hernia repair.    Examination-Activity Limitations  Reach Overhead;Other    Stability/Clinical Decision Making  Evolving/Moderate complexity    PT Frequency  3x / week    PT Duration  4 weeks    PT Treatment/Interventions  ADLs/Self Care Home Management;Cryotherapy;Electrical Stimulation;Moist Heat;Iontophoresis 4mg /ml Dexamethasone;Therapeutic activities;Therapeutic exercise;Manual techniques;Patient/family education;Passive range of motion;Dry needling;Joint Manipulations;Vasopneumatic Device    PT Next Visit Plan  E'stim and vasopneumatic, STW/M, AAROM progressing to PRE's (ie:  Yellow theraband RW4).    Consulted and Agree with Plan of Care  Patient       Patient will benefit from skilled therapeutic intervention in order to improve the following deficits and impairments:  Pain, Decreased activity tolerance, Decreased strength, Decreased range of motion, Impaired UE functional use  Visit Diagnosis: Acute pain of right shoulder  Muscle weakness (generalized)     Problem List Patient Active Problem List   Diagnosis Date Noted  . Thrombocytopenia ([place]) 07/04/2018  . Family history of malignant neoplasm of gastrointestinal tract 12/23/2013  . Colon polyps   . HLD (hyperlipidemia) 03/26/2013  . Hyperuricemia 03/26/2013  . Pain in left hip 02/25/2013  . Neuropathy 02/25/2013  . Peripheral neuropathy in sarcoidosis  12/08/2012  . Sarcoidosis of lung ([place]) 09/28/2010  . ADENOCARCINOMA, PROSTATE 09/28/2010  . GERD 09/28/2010  . DOE (dyspnea on exertion) 07/04/2009    Zailynn Brandel, [place] MPT 02/08/2020, 12:03 PM  [facility] [place], [place], [address] Phone: [phone]   Fax:  [phone]  Name: Hashem Filyaw MRN: [id number] Date of Birth: Jun 09, 1956

## 2020-02-12 ENCOUNTER — Ambulatory Visit: Payer: BC Managed Care – PPO | Admitting: Physical Therapy

## 2020-02-12 ENCOUNTER — Other Ambulatory Visit: Payer: Self-pay

## 2020-02-12 DIAGNOSIS — M25511 Pain in right shoulder: Secondary | ICD-10-CM | POA: Diagnosis not present

## 2020-02-12 DIAGNOSIS — M6281 Muscle weakness (generalized): Secondary | ICD-10-CM | POA: Diagnosis not present

## 2020-02-12 NOTE — Therapy (Signed)
[facility] [place], [place], [address] Phone: [phone]   Fax:  [phone]  Physical Therapy Treatment  Patient Details  Name: Philip Hernandez MRN: [id number] Date of Birth: 1956/04/28 Referring Provider (PT): Victorino December MD   Encounter Date: 02/12/2020  PT End of Session - 02/12/20 1231    Visit Number  4    Number of Visits  12    Date for PT Re-Evaluation  03/01/20    Authorization Type  FOTO.    PT Start Time  1031    PT Stop Time  1122    PT Time Calculation (min)  51 min    Activity Tolerance  Patient tolerated treatment well    Behavior During Therapy  WFL for tasks assessed/performed       Past Medical History:  Diagnosis Date  . Allergy   . Arthritis   . Cataract   . Colon polyp   . Colon polyps 2015  . Cough    chronic sinus issues, GERD with poor dietary control and Pulmonary Sarcoid.   Marland Kitchen GERD (gastroesophageal reflux disease) Jan 2012   [name]. Bx proven.  Deve abd pain in Jan 2012 following high dose steroids for sarcoid. s/p EGD and improvement in symptoms following PPI  . Hypogonadism male   . Kidney stone   . Malignant neoplasm of prostate [facility]) dec 2010   [name]. s/p surgery dec 2010, hx of rnormal PSA  since then  . Other and unspecified mycoses   . Painful respiration   . Peripheral neuropathy   . Prostate cancer ([place])    reoccurence (getting radiation 01-14-17 start).  . Sarcoidosis dec 2011   stage 2 pulmonary, neck nodes, splenic granuloma  . Skin cancer   . Swelling, mass, or lump in head and neck     Past Surgical History:  Procedure Laterality Date  . HERNIA REPAIR  13 yrs ago  . HERNIA REPAIR    . LYMPH NODE BIOPSY  05/2010, 07/2010  . nasal polyps  1984  . NASAL SINUS SURGERY  2013  . PROSTATE SURGERY  2010   radiation 2018  . SHOULDER SURGERY Right 2009, 2010   x 2  . SKIN CANCER EXCISION  11/16/14    There were no vitals filed for this  visit.  Subjective Assessment - 02/12/20 1120    Subjective  COVID-19 screen performed prior to patient entering clinic.  Not much better.    Pertinent History  Active prostate cancer (he states his PSA levels have been staying low though), two previous right shoulder surgeries, peripheral neuropathy, hernia repair.    Currently in Pain?  Yes    Pain Score  7     Pain Location  Shoulder    Pain Orientation  Right    Pain Descriptors / Indicators  Aching;Sharp    Pain Type  Acute pain    Pain Onset  More than a month ago                        [facility] Adult PT Treatment/Exercise - 02/12/20 0001      Exercises   Exercises  Shoulder      Shoulder Exercises: Standing   Other Standing Exercises  RW4 today with red theraband to fatigue all directions.      Shoulder Exercises: ROM/Strengthening   UBE (Upper Arm Bike)  120 RPM's x 10 minutes.      Modalities  Modalities  Psychologist, educational Location  RT shoulder.    Electrical Stimulation Action  Pre-mod.    Electrical Stimulation Parameters  80-150 Hz x 20 minutes.    Electrical Stimulation Goals  Pain      Vasopneumatic   Number Minutes Vasopneumatic   20 minutes    Vasopnuematic Location   --   Right shoulder.   Vasopneumatic Pressure  Low      Manual Therapy   Manual Therapy  Soft tissue mobilization    Soft tissue mobilization  STW/M/IASTM x 9 minutes to patient's affected right shoulder musculature.  Performed ischemic release technique to posterior cuff region.                  PT Long Term Goals - 02/02/20 1154      PT LONG TERM GOAL #1   Title  Independent with a HEP.    Time  4    Period  Weeks    Status  New      PT LONG TERM GOAL #2   Title  Increase right shoulder strength to a solid 4+/5 to increase stability for performance of functional activities.    Time  4    Period  Weeks    Status  New      PT LONG  TERM GOAL #3   Title  Perform ADL's with pain not > 3/10.    Time  4    Period  Weeks    Status  New            Plan - 02/12/20 1233    Clinical Impression Statement  Good progression to red theraband for RW4 exercise.  He had a trigger point in his right posterior cuff musculature that responded well to STW/M and ischemic release technique.    Personal Factors and Comorbidities  Comorbidity 1;Comorbidity 2;Comorbidity 3+    Comorbidities  Active prostate cancer (he states his PSA levels have been staying low though), two previous right shoulder surgeries, peripheral neuropathy, hernia repair.    Examination-Activity Limitations  Reach Overhead;Other    Examination-Participation Restrictions  Other    Stability/Clinical Decision Making  Evolving/Moderate complexity    PT Frequency  3x / week    PT Duration  4 weeks    PT Treatment/Interventions  ADLs/Self Care Home Management;Cryotherapy;Electrical Stimulation;Moist Heat;Iontophoresis 4mg /ml Dexamethasone;Therapeutic activities;Therapeutic exercise;Manual techniques;Patient/family education;Passive range of motion;Dry needling;Joint Manipulations;Vasopneumatic Device    PT Next Visit Plan  E'stim and vasopneumatic, STW/M, AAROM progressing to PRE's (ie:  Yellow theraband RW4).    Consulted and Agree with Plan of Care  Patient       Patient will benefit from skilled therapeutic intervention in order to improve the following deficits and impairments:  Pain, Decreased activity tolerance, Decreased strength, Decreased range of motion, Impaired UE functional use  Visit Diagnosis: Acute pain of right shoulder  Muscle weakness (generalized)     Problem List Patient Active Problem List   Diagnosis Date Noted  . Thrombocytopenia ([place]) 07/04/2018  . Family history of malignant neoplasm of gastrointestinal tract 12/23/2013  . Colon polyps   . HLD (hyperlipidemia) 03/26/2013  . Hyperuricemia 03/26/2013  . Pain in left hip 02/25/2013   . Neuropathy 02/25/2013  . Peripheral neuropathy in sarcoidosis 12/08/2012  . Sarcoidosis of lung ([place]) 09/28/2010  . ADENOCARCINOMA, PROSTATE 09/28/2010  . GERD 09/28/2010  . DOE (dyspnea on exertion) 07/04/2009    Natascha Edmonds, [place] MPT 02/12/2020,  12:34 PM  [facility] [place], [place], [address] Phone: [phone]   Fax:  [phone]  Name: Adir Gunnett MRN: [id number] Date of Birth: 1955/11/21

## 2020-02-16 ENCOUNTER — Ambulatory Visit: Payer: BC Managed Care – PPO | Attending: Orthopedic Surgery | Admitting: Physical Therapy

## 2020-02-16 ENCOUNTER — Encounter: Payer: Self-pay | Admitting: Physical Therapy

## 2020-02-16 ENCOUNTER — Other Ambulatory Visit: Payer: Self-pay

## 2020-02-16 DIAGNOSIS — M6281 Muscle weakness (generalized): Secondary | ICD-10-CM

## 2020-02-16 DIAGNOSIS — M25511 Pain in right shoulder: Secondary | ICD-10-CM

## 2020-02-16 NOTE — Therapy (Signed)
[facility] [place], [place], [address] Phone: [phone]   Fax:  [phone]  Physical Therapy Treatment  Patient Details  Name: Philip Hernandez MRN: [id number] Date of Birth: May 04, 1956 Referring Provider (PT): Victorino December MD   Encounter Date: 02/16/2020  PT End of Session - 02/16/20 0903    Visit Number  5    Number of Visits  12    Date for PT Re-Evaluation  03/01/20    Authorization Type  FOTO.    PT Start Time  0900    PT Stop Time  0944    PT Time Calculation (min)  44 min    Activity Tolerance  Patient tolerated treatment well    Behavior During Therapy  WFL for tasks assessed/performed       Past Medical History:  Diagnosis Date  . Allergy   . Arthritis   . Cataract   . Colon polyp   . Colon polyps 2015  . Cough    chronic sinus issues, GERD with poor dietary control and Pulmonary Sarcoid.   Marland Kitchen GERD (gastroesophageal reflux disease) Jan 2012   [name]. Bx proven.  Deve abd pain in Jan 2012 following high dose steroids for sarcoid. s/p EGD and improvement in symptoms following PPI  . Hypogonadism male   . Kidney stone   . Malignant neoplasm of prostate [facility]) dec 2010   [name]. s/p surgery dec 2010, hx of rnormal PSA  since then  . Other and unspecified mycoses   . Painful respiration   . Peripheral neuropathy   . Prostate cancer ([place])    reoccurence (getting radiation 01-14-17 start).  . Sarcoidosis dec 2011   stage 2 pulmonary, neck nodes, splenic granuloma  . Skin cancer   . Swelling, mass, or lump in head and neck     Past Surgical History:  Procedure Laterality Date  . HERNIA REPAIR  13 yrs ago  . HERNIA REPAIR    . LYMPH NODE BIOPSY  05/2010, 07/2010  . nasal polyps  1984  . NASAL SINUS SURGERY  2013  . PROSTATE SURGERY  2010   radiation 2018  . SHOULDER SURGERY Right 2009, 2010   x 2  . SKIN CANCER EXCISION  11/16/14    There were no vitals filed for this visit.  Subjective  Assessment - 02/16/20 0902    Subjective  COVID-19 screen performed prior to patient entering clinic. Reports that at times he feels the same and times where he feels better. Reports pain intermittantly with donning belt and lifting up.    Pertinent History  Active prostate cancer (he states his PSA levels have been staying low though), two previous right shoulder surgeries, peripheral neuropathy, hernia repair.    Patient Stated Goals  Use right UE wihtout pain.    Currently in Pain?  No/denies         [facility] PT Assessment - 02/16/20 0001      Assessment   Medical Diagnosis  Tendonitis of right shoulder.    Referring Provider (PT)  Victorino December MD    Next MD Visit  03/01/2020      Restrictions   Weight Bearing Restrictions  No                    [facility] Adult PT Treatment/Exercise - 02/16/20 0001      Shoulder Exercises: Standing   Protraction  Strengthening;Right;20 reps;10 reps;Theraband    Theraband Level (Shoulder Protraction)  Level  2 (Red)    External Rotation  Strengthening;Right;20 reps;10 reps;Theraband    Theraband Level (Shoulder External Rotation)  Level 2 (Red)    Internal Rotation  Strengthening;Right;20 reps;10 reps;Theraband    Theraband Level (Shoulder Internal Rotation)  Level 2 (Red)    Extension  Strengthening;Right;20 reps;10 reps;Theraband    Theraband Level (Shoulder Extension)  Level 2 (Red)    Row  Strengthening;Right;20 reps;10 reps;Theraband    Theraband Level (Shoulder Row)  Level 2 (Red)      Shoulder Exercises: ROM/Strengthening   UBE (Upper Arm Bike)  120 RPM x8 min    Wall Wash  into flexion with ER x10 reps      Modalities   Modalities  Electrical Stimulation;Vasopneumatic;Iontophoresis      Acupuncturist Location  R shoulder    Electrical Stimulation Action  IFC    Electrical Stimulation Parameters  80-150 hz x15 min    Electrical Stimulation Goals  Pain      Iontophoresis   Type of  Iontophoresis  Dexamethasone    Location  R AC joint    Dose  1.0 ml    Time  8      Vasopneumatic   Number Minutes Vasopneumatic   10 minutes    Vasopnuematic Location   Shoulder    Vasopneumatic Pressure  Low    Vasopneumatic Temperature   36             PT Education - 02/16/20 1002    Education Details  Iontophoresis education    Person(s) Educated  Patient    Methods  Explanation;Handout    Comprehension  Verbalized understanding          PT Long Term Goals - 02/02/20 1154      PT LONG TERM GOAL #1   Title  Independent with a HEP.    Time  4    Period  Weeks    Status  New      PT LONG TERM GOAL #2   Title  Increase right shoulder strength to a solid 4+/5 to increase stability for performance of functional activities.    Time  4    Period  Weeks    Status  New      PT LONG TERM GOAL #3   Title  Perform ADL's with pain not > 3/10.    Time  4    Period  Weeks    Status  New            Plan - 02/16/20 0959    Clinical Impression Statement  Patient presented in clinic with reports of continued intermittant pain with RUE movement. Patient able to complete ROM and strengthening as directed with greater discomfort with resisted ER. Normal modalties response noted following removal of the modalities. Iontophoresis patch donned to R AC joint along with education and handout regaring parameters and precautions.    Personal Factors and Comorbidities  Comorbidity 1;Comorbidity 2;Comorbidity 3+    Comorbidities  Active prostate cancer (he states his PSA levels have been staying low though), two previous right shoulder surgeries, peripheral neuropathy, hernia repair.    Examination-Activity Limitations  Reach Overhead;Other    Examination-Participation Restrictions  Other    Stability/Clinical Decision Making  Evolving/Moderate complexity    PT Frequency  3x / week    PT Duration  4 weeks    PT Treatment/Interventions  ADLs/Self Care Home  Management;Cryotherapy;Electrical Stimulation;Moist Heat;Iontophoresis 4mg /ml Dexamethasone;Therapeutic activities;Therapeutic exercise;Manual techniques;Patient/family education;Passive range of  motion;Dry needling;Joint Manipulations;Vasopneumatic Device    PT Next Visit Plan  E'stim and vasopneumatic, STW/M, AAROM progressing to PRE's (ie:  Yellow theraband RW4).    Consulted and Agree with Plan of Care  Patient       Patient will benefit from skilled therapeutic intervention in order to improve the following deficits and impairments:  Pain, Decreased activity tolerance, Decreased strength, Decreased range of motion, Impaired UE functional use  Visit Diagnosis: Acute pain of right shoulder  Muscle weakness (generalized)     Problem List Patient Active Problem List   Diagnosis Date Noted  . Thrombocytopenia ([place]) 07/04/2018  . Family history of malignant neoplasm of gastrointestinal tract 12/23/2013  . Colon polyps   . HLD (hyperlipidemia) 03/26/2013  . Hyperuricemia 03/26/2013  . Pain in left hip 02/25/2013  . Neuropathy 02/25/2013  . Peripheral neuropathy in sarcoidosis 12/08/2012  . Sarcoidosis of lung ([place]) 09/28/2010  . ADENOCARCINOMA, PROSTATE 09/28/2010  . GERD 09/28/2010  . DOE (dyspnea on exertion) 07/04/2009    Standley Brooking, PTA  02/16/2020, 10:02 AM  [facility] [address] [place], [place], [address] Phone: [phone]   Fax:  [phone]  Name: Philip Hernandez MRN: [id number] Date of Birth: 27-Jun-1956

## 2020-02-19 ENCOUNTER — Ambulatory Visit: Payer: BC Managed Care – PPO | Admitting: Physical Therapy

## 2020-02-19 ENCOUNTER — Encounter: Payer: Self-pay | Admitting: Physical Therapy

## 2020-02-19 ENCOUNTER — Other Ambulatory Visit: Payer: Self-pay

## 2020-02-19 DIAGNOSIS — M25511 Pain in right shoulder: Secondary | ICD-10-CM | POA: Diagnosis not present

## 2020-02-19 DIAGNOSIS — M6281 Muscle weakness (generalized): Secondary | ICD-10-CM | POA: Diagnosis not present

## 2020-02-19 NOTE — Therapy (Signed)
[facility] [place], [place], [address] Phone: [phone]   Fax:  [phone]  Physical Therapy Treatment  Patient Details  Name: Philip Hernandez MRN: [id number] Date of Birth: 1956/02/18 Referring Provider (PT): Victorino December MD   Encounter Date: 02/19/2020  PT End of Session - 02/19/20 0952    Visit Number  6    Number of Visits  12    Date for PT Re-Evaluation  03/01/20    Authorization Type  FOTO.    PT Start Time  0945    PT Stop Time  1038    PT Time Calculation (min)  53 min    Activity Tolerance  Patient tolerated treatment well    Behavior During Therapy  WFL for tasks assessed/performed       Past Medical History:  Diagnosis Date   Allergy    Arthritis    Cataract    Colon polyp    Colon polyps 2015   Cough    chronic sinus issues, GERD with poor dietary control and Pulmonary Sarcoid.    GERD (gastroesophageal reflux disease) Jan 2012   [name]. Bx proven.  Deve abd pain in Jan 2012 following high dose steroids for sarcoid. s/p EGD and improvement in symptoms following PPI   Hypogonadism male    Kidney stone    Malignant neoplasm of prostate [facility]) dec 2010   [name]. s/p surgery dec 2010, hx of rnormal PSA  since then   Other and unspecified mycoses    Painful respiration    Peripheral neuropathy    Prostate cancer ([facility])    reoccurence (getting radiation 01-14-17 start).   Sarcoidosis dec 2011   stage 2 pulmonary, neck nodes, splenic granuloma   Skin cancer    Swelling, mass, or lump in head and neck     Past Surgical History:  Procedure Laterality Date   HERNIA REPAIR  13 yrs ago   [place] BIOPSY  05/2010, 07/2010   nasal polyps  1984   NASAL SINUS SURGERY  2013   PROSTATE SURGERY  2010   radiation 2018   SHOULDER SURGERY Right 2009, 2010   x 2   SKIN CANCER EXCISION  11/16/14    There were no vitals filed for this visit.  Subjective  Assessment - 02/19/20 0950    Subjective  COVID-19 screen performed prior to patient entering clinic. Pt reporting 5/10 pain when he raises his arm. Pt reporting difficutly with lifting and when he puts his belt on.    Pertinent History  Active prostate cancer (he states his PSA levels have been staying low though), two previous right shoulder surgeries, peripheral neuropathy, hernia repair.    Patient Stated Goals  Use right UE wihtout pain.    Currently in Pain?  Yes    Pain Score  5     Pain Location  Shoulder    Pain Orientation  Right    Pain Descriptors / Indicators  Aching;Sore    Pain Type  Acute pain    Pain Onset  More than a month ago                        [facility] Adult PT Treatment/Exercise - 02/19/20 0001      Shoulder Exercises: Standing   Protraction  Strengthening;Right;20 reps;10 reps;Theraband    Theraband Level (Shoulder Protraction)  Level 2 (Red)    External Rotation  Strengthening;Right;20 reps;10 reps;Theraband    Theraband Level (Shoulder External Rotation)  Level 2 (Red)    Internal Rotation  Strengthening;Right;20 reps;10 reps;Theraband    Theraband Level (Shoulder Internal Rotation)  Level 2 (Red)    Extension  Strengthening;Right;20 reps;10 reps;Theraband    Theraband Level (Shoulder Extension)  Level 2 (Red)    Row  Strengthening;Right;20 reps;10 reps;Theraband    Theraband Level (Shoulder Row)  Level 2 (Red)    Other Standing Exercises  cane flexion x 15 reps, cane ER x 15 reps      Shoulder Exercises: ROM/Strengthening   UBE (Upper Arm Bike)  120 RPM x8 min    Wall Wash  flexion and scaption x 20 reps      Modalities   Modalities  Electrical Stimulation;Vasopneumatic;Iontophoresis      Acupuncturist Location  R shoulder    Electrical Stimulation Action  IFC    Electrical Stimulation Parameters  80-150 Hz x 10 minutes    Electrical Stimulation Goals  Pain      Iontophoresis   Type of  Iontophoresis  Dexamethasone    Location  R     Dose  4g/ 98m    Time  4 hour patch   8 minute set up and education     Vasopneumatic   Number Minutes Vasopneumatic   10 minutes    Vasopnuematic Location   Shoulder    Vasopneumatic Pressure  Low    Vasopneumatic Temperature   34      Manual Therapy   Manual therapy comments  5 minutes    Soft tissue mobilization  IASTM to infraspinatus, anterior shoulder and R upper trap and bicep tendon                  PT Long Term Goals - 02/19/20 0957      PT LONG TERM GOAL #1   Title  Independent with a HEP.    Time  4    Period  Weeks    Status  On-going      PT LONG TERM GOAL #2   Title  Increase right shoulder strength to a solid 4+/5 to increase stability for performance of functional activities.    Time  4    Status  On-going      PT LONG TERM GOAL #3   Title  Perform ADLs with pain not > 3/10.    Status  On-going            Plan - 02/19/20 03893   Clinical Impression Statement  Pt arriving to therpay reporting 5/10 pain when lifting his R shoulder, but no pain at rest. Pt tolerating treatment well. Pt was educated in Iontophoresis 4 hour extended patch application and and pt instructed to take patch off around 2:30pm today. Continue skilled PT to progress toward LTG's no goals met this session.    Personal Factors and Comorbidities  Comorbidity 1;Comorbidity 2;Comorbidity 3+    Comorbidities  Active prostate cancer (he states his PSA levels have been staying low though), two previous right shoulder surgeries, peripheral neuropathy, hernia repair.    Examination-Activity Limitations  Reach Overhead;Other    Examination-Participation Restrictions  Other    Stability/Clinical Decision Making  Evolving/Moderate complexity    PT Frequency  3x / week    PT Duration  4 weeks    PT Treatment/Interventions  ADLs/Self Care Home Management;Cryotherapy;Electrical Stimulation;Moist Heat;Iontophoresis '4mg'$ /ml  Dexamethasone;Therapeutic activities;Therapeutic exercise;Manual techniques;Patient/family education;Passive range of motion;Dry needling;Joint  Manipulations;Vasopneumatic Device    PT Next Visit Plan  E'stim and vasopneumatic, STW/M, AAROM progressing to PRE's (ie:  red  theraband RW4).    Consulted and Agree with Plan of Care  Patient       Patient will benefit from skilled therapeutic intervention in order to improve the following deficits and impairments:  Pain, Decreased activity tolerance, Decreased strength, Decreased range of motion, Impaired UE functional use  Visit Diagnosis: Acute pain of right shoulder  Muscle weakness (generalized)     Problem List Patient Active Problem List   Diagnosis Date Noted   Thrombocytopenia ([place]) 07/04/2018   Family history of malignant neoplasm of gastrointestinal tract 12/23/2013   Colon polyps    HLD (hyperlipidemia) 03/26/2013   Hyperuricemia 03/26/2013   Pain in left hip 02/25/2013   Neuropathy 02/25/2013   Peripheral neuropathy in sarcoidosis 12/08/2012   Sarcoidosis of lung ([place]) 09/28/2010   ADENOCARCINOMA, PROSTATE 09/28/2010   GERD 09/28/2010   DOE (dyspnea on exertion) 07/04/2009    Oretha Caprice, PT, MPT 02/19/2020, 10:42 AM  [facility] [place], [place], [address] Phone: [phone]   Fax:  [phone]  Name: Philip Hernandez MRN: [id number] Date of Birth: [birth year]

## 2020-02-23 ENCOUNTER — Ambulatory Visit: Payer: BC Managed Care – PPO | Admitting: Physical Therapy

## 2020-02-23 ENCOUNTER — Other Ambulatory Visit: Payer: Self-pay

## 2020-02-23 DIAGNOSIS — M6281 Muscle weakness (generalized): Secondary | ICD-10-CM | POA: Diagnosis not present

## 2020-02-23 DIAGNOSIS — M25511 Pain in right shoulder: Secondary | ICD-10-CM

## 2020-02-23 NOTE — Therapy (Signed)
[facility] [place], [place], [address] Phone: [phone]   Fax:  [phone]  Physical Therapy Treatment  Patient Details  Name: Philip Hernandez MRN: [id number] Date of Birth: Feb 21, 1956 Referring Provider (PT): Victorino December MD   Encounter Date: 02/23/2020  PT End of Session - 02/23/20 1640    Visit Number  7    Number of Visits  12    Date for PT Re-Evaluation  03/01/20    Authorization Type  FOTO.    PT Start Time  0400    PT Stop Time  0444    PT Time Calculation (min)  44 min    Activity Tolerance  Patient tolerated treatment well    Behavior During Therapy  WFL for tasks assessed/performed       Past Medical History:  Diagnosis Date  . Allergy   . Arthritis   . Cataract   . Colon polyp   . Colon polyps 2015  . Cough    chronic sinus issues, GERD with poor dietary control and Pulmonary Sarcoid.   Marland Kitchen GERD (gastroesophageal reflux disease) Jan 2012   [name]. Bx proven.  Deve abd pain in Jan 2012 following high dose steroids for sarcoid. s/p EGD and improvement in symptoms following PPI  . Hypogonadism male   . Kidney stone   . Malignant neoplasm of prostate [facility]) dec 2010   [name]. s/p surgery dec 2010, hx of rnormal PSA  since then  . Other and unspecified mycoses   . Painful respiration   . Peripheral neuropathy   . Prostate cancer ([place])    reoccurence (getting radiation 01-14-17 start).  . Sarcoidosis dec 2011   stage 2 pulmonary, neck nodes, splenic granuloma  . Skin cancer   . Swelling, mass, or lump in head and neck     Past Surgical History:  Procedure Laterality Date  . HERNIA REPAIR  13 yrs ago  . HERNIA REPAIR    . LYMPH NODE BIOPSY  05/2010, 07/2010  . nasal polyps  1984  . NASAL SINUS SURGERY  2013  . PROSTATE SURGERY  2010   radiation 2018  . SHOULDER SURGERY Right 2009, 2010   x 2  . SKIN CANCER EXCISION  11/16/14    There were no vitals filed for this visit.  Subjective  Assessment - 02/23/20 1630    Subjective  COVID-19 screen performed prior to patient entering clinic.  Pain staying at about a 5/10 and continues to hurt with certain movements.    Pertinent History  Active prostate cancer (he states his PSA levels have been staying low though), two previous right shoulder surgeries, peripheral neuropathy, hernia repair.    Pain Onset  More than a month ago                        [facility] Adult PT Treatment/Exercise - 02/23/20 0001      Shoulder Exercises: ROM/Strengthening   UBE (Upper Arm Bike)  8 minutes.      Acupuncturist Location  RT shoulder    Electrical Stimulation Action  Pre-mod.    Electrical Stimulation Parameters  80-150 x 15 minutes.      Iontophoresis   Type of Iontophoresis  Dexamethasone    Location  --   RT shoulder.   Dose  80 mA-min.    Time  --   8.     Manual Therapy   Manual  Therapy  Soft tissue mobilization    Soft tissue mobilization  IASTM x 8 minutes.                  PT Long Term Goals - 02/19/20 0957      PT LONG TERM GOAL #1   Title  Independent with a HEP.    Time  4    Period  Weeks    Status  On-going      PT LONG TERM GOAL #2   Title  Increase right shoulder strength to a solid 4+/5 to increase stability for performance of functional activities.    Time  4    Status  On-going      PT LONG TERM GOAL #3   Title  Perform ADL's with pain not > 3/10.    Status  On-going            Plan - 02/23/20 1645    Clinical Impression Statement  The patient continues to experience right shoulder pain with certain movements.  He experiences referred pain to his middle deltoid.    Personal Factors and Comorbidities  Comorbidity 1;Comorbidity 2;Comorbidity 3+    Comorbidities  Active prostate cancer (he states his PSA levels have been staying low though), two previous right shoulder surgeries, peripheral neuropathy, hernia repair.     Examination-Activity Limitations  Reach Overhead;Other    Examination-Participation Restrictions  Other    Stability/Clinical Decision Making  Evolving/Moderate complexity    PT Frequency  3x / week    PT Duration  4 weeks    PT Treatment/Interventions  ADLs/Self Care Home Management;Cryotherapy;Electrical Stimulation;Moist Heat;Iontophoresis 4mg /ml Dexamethasone;Therapeutic activities;Therapeutic exercise;Manual techniques;Patient/family education;Passive range of motion;Dry needling;Joint Manipulations;Vasopneumatic Device    PT Next Visit Plan  E'stim and vasopneumatic, STW/M, AAROM progressing to PRE's (ie:  red  theraband RW4).    Consulted and Agree with Plan of Care  Patient       Patient will benefit from skilled therapeutic intervention in order to improve the following deficits and impairments:  Pain, Decreased activity tolerance, Decreased strength, Decreased range of motion, Impaired UE functional use  Visit Diagnosis: Acute pain of right shoulder  Muscle weakness (generalized)     Problem List Patient Active Problem List   Diagnosis Date Noted  . Thrombocytopenia ([place]) 07/04/2018  . Family history of malignant neoplasm of gastrointestinal tract 12/23/2013  . Colon polyps   . HLD (hyperlipidemia) 03/26/2013  . Hyperuricemia 03/26/2013  . Pain in left hip 02/25/2013  . Neuropathy 02/25/2013  . Peripheral neuropathy in sarcoidosis 12/08/2012  . Sarcoidosis of lung ([place]) 09/28/2010  . ADENOCARCINOMA, PROSTATE 09/28/2010  . GERD 09/28/2010  . DOE (dyspnea on exertion) 07/04/2009    Jontavia Leatherbury, [place] MPT 02/23/2020, 4:49 PM  [facility] [place], [place], [address] Phone: [phone]   Fax:  [phone]  Name: Leeandre Nordling MRN: [id number] Date of Birth: 1955/11/26

## 2020-02-25 ENCOUNTER — Other Ambulatory Visit: Payer: Self-pay

## 2020-02-25 ENCOUNTER — Ambulatory Visit: Payer: BC Managed Care – PPO | Admitting: *Deleted

## 2020-02-25 DIAGNOSIS — M25511 Pain in right shoulder: Secondary | ICD-10-CM

## 2020-02-25 DIAGNOSIS — M6281 Muscle weakness (generalized): Secondary | ICD-10-CM | POA: Diagnosis not present

## 2020-02-25 NOTE — Therapy (Signed)
[facility] [place], [place], [address] Phone: [phone]   Fax:  [phone]  Physical Therapy Treatment  Patient Details  Name: Philip Hernandez MRN: [id number] Date of Birth: [birth year] Referring Provider (PT): Victorino December MD   Encounter Date: 02/25/2020   PT End of Session - 02/25/20 1745    Visit Number 8    Number of Visits 12    Date for PT Re-Evaluation 03/01/20    Authorization Type FOTO.    PT Start Time [phone]    PT Stop Time 1039    PT Time Calculation (min) 53 min           Past Medical History:  Diagnosis Date  . Allergy   . Arthritis   . Cataract   . Colon polyp   . Colon polyps 2015  . Cough    chronic sinus issues, GERD with poor dietary control and Pulmonary Sarcoid.   Marland Kitchen GERD (gastroesophageal reflux disease) Jan 2012   [name]. Bx proven.  Deve abd pain in Jan 2012 following high dose steroids for sarcoid. s/p EGD and improvement in symptoms following PPI  . Hypogonadism male   . Kidney stone   . Malignant neoplasm of prostate [facility]) dec 2010   [name]. s/p surgery dec 2010, hx of rnormal PSA  since then  . Other and unspecified mycoses   . Painful respiration   . Peripheral neuropathy   . Prostate cancer ([place])    reoccurence (getting radiation 01-14-17 start).  . Sarcoidosis dec 2011   stage 2 pulmonary, neck nodes, splenic granuloma  . Skin cancer   . Swelling, mass, or lump in head and neck     Past Surgical History:  Procedure Laterality Date  . HERNIA REPAIR  13 yrs ago  . HERNIA REPAIR    . LYMPH NODE BIOPSY  05/2010, 07/2010  . nasal polyps  1984  . NASAL SINUS SURGERY  2013  . PROSTATE SURGERY  2010   radiation 2018  . SHOULDER SURGERY Right 2009, 2010   x 2  . SKIN CANCER EXCISION  11/16/14    There were no vitals filed for this visit.   Subjective Assessment - 02/25/20 1758    Subjective COVID-19 screen performed prior to patient entering clinic.  Pain staying  at about a 5/10 and continues to hurt with certain movements.To MD next week    Pertinent History Active prostate cancer (he states his PSA levels have been staying low though), two previous right shoulder surgeries, peripheral neuropathy, hernia repair.    Patient Stated Goals Use right UE wihtout pain.    Currently in Pain? Yes    Pain Score 5     Pain Location Shoulder    Pain Orientation Right    Pain Descriptors / Indicators Aching;Sore    Pain Onset More than a month ago                             [facility] Adult PT Treatment/Exercise - 02/25/20 0001      Shoulder Exercises: Standing   External Rotation Strengthening;Right;Theraband   3 x fatigue   Theraband Level (Shoulder External Rotation) Level 1 (Yellow)    Internal Rotation Strengthening   3xfatigue   Extension Strengthening;Right;20 reps      Modalities   Modalities Electrical Stimulation;Vasopneumatic;Iontophoresis      Acupuncturist Location RT shoulder  Research officer, political party Parameters 80-150hz  x 15 mins    Electrical Stimulation Goals Pain      Iontophoresis   Type of Iontophoresis Dexamethasone    Location R    RT shoulder.   Dose 80 mA-min.    Time --   8.     Vasopneumatic   Number Minutes Vasopneumatic  10 minutes    Vasopnuematic Location  Shoulder    Vasopneumatic Pressure Low    Vasopneumatic Temperature  34      Manual Therapy   Manual Therapy Soft tissue mobilization    Soft tissue mobilization IASTM x 8 minutes.                       PT Long Term Goals - 02/19/20 0957      PT LONG TERM GOAL #1   Title Independent with a HEP.    Time 4    Period Weeks    Status On-going      PT LONG TERM GOAL #2   Title Increase right shoulder strength to a solid 4+/5 to increase stability for performance of functional activities.    Time 4    Status On-going      PT LONG TERM GOAL #3   Title  Perform ADL's with pain not > 3/10.    Status On-going                 Plan - 02/25/20 1746    Clinical Impression Statement Pt arrived today doing about the same overall. He feels that Rx's help, but short lived after ADL's.Therex performed  F/B STW / IASTM to posterior cuff and subacromial area. He is still very sore in these places. Current LTGs are ongoing due to pain. Normal modalityresponse today.    Personal Factors and Comorbidities Comorbidity 1;Comorbidity 2;Comorbidity 3+    Comorbidities Active prostate cancer (he states his PSA levels have been staying low though), two previous right shoulder surgeries, peripheral neuropathy, hernia repair.    Examination-Activity Limitations Reach Overhead;Other    Examination-Participation Restrictions Other    Stability/Clinical Decision Making Evolving/Moderate complexity    PT Frequency 3x / week    PT Duration 4 weeks    PT Treatment/Interventions ADLs/Self Care Home Management;Cryotherapy;Electrical Stimulation;Moist Heat;Iontophoresis 4mg /ml Dexamethasone;Therapeutic activities;Therapeutic exercise;Manual techniques;Patient/family education;Passive range of motion;Dry needling;Joint Manipulations;Vasopneumatic Device    PT Next Visit Plan E'stim and vasopneumatic, STW/M, AAROM progressing to PRE's (ie:  red  theraband RW4).    Consulted and Agree with Plan of Care Patient           Patient will benefit from skilled therapeutic intervention in order to improve the following deficits and impairments:  Pain, Decreased activity tolerance, Decreased strength, Decreased range of motion, Impaired UE functional use  Visit Diagnosis: Muscle weakness (generalized)  Acute pain of right shoulder     Problem List Patient Active Problem List   Diagnosis Date Noted  . Thrombocytopenia ([place]) 07/04/2018  . Family history of malignant neoplasm of gastrointestinal tract 12/23/2013  . Colon polyps   . HLD (hyperlipidemia) 03/26/2013  .  Hyperuricemia 03/26/2013  . Pain in left hip 02/25/2013  . Neuropathy 02/25/2013  . Peripheral neuropathy in sarcoidosis 12/08/2012  . Sarcoidosis of lung ([place]) 09/28/2010  . ADENOCARCINOMA, PROSTATE 09/28/2010  . GERD 09/28/2010  . DOE (dyspnea on exertion) 07/04/2009    Ianna Salmela,CHRIS, PTA 02/25/2020, 6:05 PM  [facility] [place],  [place], [address] Phone: [phone]   Fax:  [phone]  Name: Oluwatomisin Deman MRN: [id number] Date of Birth: 21-Jul-1956

## 2020-02-29 ENCOUNTER — Ambulatory Visit: Payer: BC Managed Care – PPO

## 2020-02-29 ENCOUNTER — Other Ambulatory Visit: Payer: Self-pay

## 2020-02-29 DIAGNOSIS — M25511 Pain in right shoulder: Secondary | ICD-10-CM

## 2020-02-29 DIAGNOSIS — M6281 Muscle weakness (generalized): Secondary | ICD-10-CM

## 2020-02-29 NOTE — Therapy (Addendum)
[facility] [place], [place], [address] Phone: [phone]   Fax:  [phone]  Physical Therapy Treatment  Patient Details  Name: Philip Hernandez MRN: [id number] Date of Birth: 30-Jul-1956 Referring Provider (PT): Victorino December MD   Encounter Date: 02/29/2020   PT End of Session - 02/29/20 1033     Visit Number 9    Number of Visits 12    Date for PT Re-Evaluation 03/01/20    Authorization Type FOTO.    PT Start Time [phone]    PT Stop Time 1035    PT Time Calculation (min) 49 min    Activity Tolerance Patient tolerated treatment well    Behavior During Therapy WFL for tasks assessed/performed             Past Medical History:  Diagnosis Date   Allergy    Arthritis    Cataract    Colon polyp    Colon polyps 2015   Cough    chronic sinus issues, GERD with poor dietary control and Pulmonary Sarcoid.    GERD (gastroesophageal reflux disease) Jan 2012   [name]. Bx proven.  Deve abd pain in Jan 2012 following high dose steroids for sarcoid. s/p EGD and improvement in symptoms following PPI   Hypogonadism male    Kidney stone    Malignant neoplasm of prostate [facility]) dec 2010   [name]. s/p surgery dec 2010, hx of rnormal PSA  since then   Other and unspecified mycoses    Painful respiration    Peripheral neuropathy    Prostate cancer ([place])    reoccurence (getting radiation 01-14-17 start).   Sarcoidosis dec 2011   stage 2 pulmonary, neck nodes, splenic granuloma   Skin cancer    Swelling, mass, or lump in head and neck     Past Surgical History:  Procedure Laterality Date   HERNIA REPAIR  13 yrs ago   [place] BIOPSY  05/2010, 07/2010   nasal polyps  1984   NASAL SINUS SURGERY  2013   PROSTATE SURGERY  2010   radiation 2018   SHOULDER SURGERY Right 2009, 2010   x 2   SKIN CANCER EXCISION  11/16/14    There were no vitals filed for this visit.   Subjective Assessment -  02/29/20 0956     Subjective COVID-19 screen performed prior to patient entering clinic.  Pt reports to return to MD tomorrow.  Continues to be limited by pain especially wiht abduction motions, c/o sharp pain wiht movement.  Pain scale 6/10.    Pertinent History Active prostate cancer (he states his PSA levels have been staying low though), two previous right shoulder surgeries, peripheral neuropathy, hernia repair.    Patient Stated Goals Use right UE wihtout pain.    Currently in Pain? Yes    Pain Score 6     Pain Location Shoulder    Pain Descriptors / Indicators Aching;Sharp;Sore    Pain Type Acute pain    Pain Onset More than a month ago    Pain Frequency Constant    Aggravating Factors  abduction motions    Pain Relieving Factors rest    Effect of Pain on Daily Activities limits                [facility] PT Assessment - 02/29/20 0001       Assessment   Medical Diagnosis Tendonitis of right shoulder.  Referring Provider (PT) Victorino December MD    Onset Date/Surgical Date --   ~5 months   Next MD Visit 03/01/2020      Precautions   Precautions --   NO ultrasound     Observation/Other Assessments   Focus on Therapeutic Outcomes (FOTO)  40% limitation.   was 65% limtation     ROM / Strength   AROM / PROM / Strength AROM;Strength      AROM   Overall AROM Comments Slow antigravity flexion and ER is full.  Behind back to L1   was behind back to L4, now at L1     Strength   Overall Strength Comments Right shoulder abduction graded grossly at 3+/5 limited at least in part due to pain.  ER/IR is 4/5.   Rt shoulder abd at 4/5 still pain; flex at 4+, ER 46, IR 4/5     Palpation   Palpation comment Tender to palpation at right acromial ridge with reffered pain to right middle                            [facility] Adult PT Treatment/Exercise - 02/29/20 0001       Shoulder Exercises: Standing   External Rotation Strengthening;Right;Theraband    Theraband Level  (Shoulder External Rotation) Level 1 (Yellow)    Internal Rotation Strengthening    Extension Strengthening;Right;20 reps      Shoulder Exercises: ROM/Strengthening   UBE (Upper Arm Bike) 8 minutes.      Modalities   Modalities Electrical Stimulation;Vasopneumatic;Iontophoresis      Acupuncturist Location RT shoulder    Research officer, political party Parameters 80-'150hz'$ x 15 min    Electrical Stimulation Goals Pain      Iontophoresis   Type of Iontophoresis Dexamethasone    Location R     Time 4 hour patch      Manual Therapy   Manual Therapy Soft tissue mobilization    Manual therapy comments Manual complete separate than rest of tx    Soft tissue mobilization STM to Rt shoulder, deltoid, subacrominal                         PT Long Term Goals - 02/29/20 1006       PT LONG TERM GOAL #1   Title Independent with a HEP.    Baseline 02/29/20:  Reports complaince with advanced HEP 2/daily with theraband exercises and stretches    Status Achieved      PT LONG TERM GOAL #2   Title Increase right shoulder strength to a solid 4+/5 to increase stability for performance of functional activities.    Baseline 02/29/20: see MMT    Status On-going      PT LONG TERM GOAL #3   Title Perform ADL's with pain not > 3/10.    Baseline 02/29/20:  pain with abduction motions and putting on belt, pain scale average 3-4/10 with ADLs    Status On-going                   Plan - 02/29/20 1249     Clinical Impression Statement Reviewed goals prior MD apt tomorrow.  FOTO, MMT and ROM measurements taken today.  Pt continues to c/o pain with abd and extension with IR/ER.  Pt continues to have tightness and pain with palpalations in subacrominal area.  Personal Factors and Comorbidities Comorbidity 1;Comorbidity 2;Comorbidity 3+    Comorbidities Active prostate cancer (he states his PSA levels have been staying  low though), two previous right shoulder surgeries, peripheral neuropathy, hernia repair.    Examination-Activity Limitations Reach Overhead;Other    Examination-Participation Restrictions Other    Stability/Clinical Decision Making Evolving/Moderate complexity    PT Frequency 3x / week    PT Duration 4 weeks    PT Treatment/Interventions ADLs/Self Care Home Management;Cryotherapy;Electrical Stimulation;Moist Heat;Iontophoresis '4mg'$ /ml Dexamethasone;Therapeutic activities;Therapeutic exercise;Manual techniques;Patient/family education;Passive range of motion;Dry needling;Joint Manipulations;Vasopneumatic Device    PT Next Visit Plan F/U with MD on 03/01/20 apt.             Patient will benefit from skilled therapeutic intervention in order to improve the following deficits and impairments:  Pain, Decreased activity tolerance, Decreased strength, Decreased range of motion, Impaired UE functional use  Visit Diagnosis: Acute pain of right shoulder  Muscle weakness (generalized)     Problem List Patient Active Problem List   Diagnosis Date Noted   Thrombocytopenia ([place]) 07/04/2018   Family history of malignant neoplasm of gastrointestinal tract 12/23/2013   Colon polyps    HLD (hyperlipidemia) 03/26/2013   Hyperuricemia 03/26/2013   Pain in left hip 02/25/2013   Neuropathy 02/25/2013   Peripheral neuropathy in sarcoidosis 12/08/2012   Sarcoidosis of lung ([place]) 09/28/2010   ADENOCARCINOMA, PROSTATE 09/28/2010   GERD 09/28/2010   DOE (dyspnea on exertion) 07/04/2009   Ihor Austin, LPTA/CLT; CBIS [phone]  Aldona Lento 02/29/2020, 1:00 PM  [facility] [place], [place], [address] Phone: [phone]   Fax:  [phone]  Name: Philip Hernandez MRN: [id number] Date of Birth: 21-May-1956  PHYSICAL THERAPY DISCHARGE SUMMARY  Visits from Start of Care: 9.  Current functional level related to goals /  functional outcomes: See above.   Remaining deficits: See below.   Education / Equipment: HEP.   Patient agrees to discharge. Patient goals were partially met. Patient is being discharged due to not returning since the last visit.    Mali Applegate MPT

## 2020-03-01 DIAGNOSIS — M7531 Calcific tendinitis of right shoulder: Secondary | ICD-10-CM | POA: Diagnosis not present

## 2020-03-15 ENCOUNTER — Encounter: Payer: Self-pay | Admitting: Adult Health

## 2020-03-15 ENCOUNTER — Other Ambulatory Visit: Payer: Self-pay

## 2020-03-15 ENCOUNTER — Ambulatory Visit (INDEPENDENT_AMBULATORY_CARE_PROVIDER_SITE_OTHER): Payer: BC Managed Care – PPO | Admitting: Adult Health

## 2020-03-15 VITALS — BP 136/84 | HR 77 | Ht 70.0 in | Wt 177.0 lb

## 2020-03-15 DIAGNOSIS — G629 Polyneuropathy, unspecified: Secondary | ICD-10-CM | POA: Diagnosis not present

## 2020-03-15 DIAGNOSIS — R202 Paresthesia of skin: Secondary | ICD-10-CM

## 2020-03-15 NOTE — Progress Notes (Signed)
PATIENT: Philip Hernandez DOB: 1955/09/28   [facility] provider: Dr. Leonie Man REASON FOR VISIT: Neuropathy HISTORY FROM: patient    Chief complaint: Chief Complaint  Patient presents with  . Peripheral Neuropathy    Bilateral lower extremity paresthesias; unchanged symptoms      HISTORY OF PRESENT ILLNESS:   Today, 03/15/2020, Philip Hernandez returns for follow-up for neuropathy with paresthesias.  He has been stable since prior visit 6 months ago with unchanged symptoms of numbness/tingling and paresthesias in bilateral lower extremities.  He continues to experience good days and bad days.  Continues on gabapentin 800 mg 4 times daily tolerating dosage well. At times, may forget to take day time dosage and will notice worsening in symptoms.  He has not trialed additional dose of gabapentin with worsening symptoms as previously discussed with Dr. Leonie Man. No further concerns at this time.    History provided for reference purposes only Update 09/15/2019 Dr. Leonie Man : He returns for follow-up after last virtual video visit on 03/02/2019.  He continues to have tingling numbness and paresthesias in his feet which are more or less unchanged.  He has good days and bad days.  Physical activity does increase it.  Patient was started on Topamax previously and I will increase the dose to 50 mg twice daily but he had trouble tolerating this and stated that he could not focus with his eyes and had trouble reading and had to rub his eyes.  After taking it for 4 to 6 weeks he discontinued it.  He did note that increasing the dose of Topamax did help to some degree.  He remains on gabapentin and the current dose of 800 mg 4 times daily.  He has not tried Lyrica or Tegretol yet but does not want to try new medication.  Patient was diagnosed with Covid infection 3 weeks ago and did self quarantine himself and now has started going out last few days.  He is currently on prednisone taper as he also has sarcoidosis.  He does  complain of feeling tired and having a minimum cough but is otherwise doing well.  His paresthesias are constant but is able to walk well he will have no problems with falls or balance issues.  Update via virtual visit 03/02/2019 Dr. Leonie Man: Philip Hernandez is seen today for virtual video office follow-up visit following last visit with [name], nurse practitioner on 08/25/2018.  He states he is doing about the same.  He still has paresthesias in his feet off and on.  He has good days and bad days.  He remains on Topamax 25 mg twice daily which is tolerating well but he is states if not sure if it is doing anything.  He also remains on gabapentin 800 mg 4 times daily and he does not miss a dose.  He continues to have gait and balance difficulties but is able to catch himself and has not fallen or hurt himself.  He does not use a cane or walker.  He recently got handicap parking sticker renewed by me.  He has no new neurological complaints.  Update 08/25/2018 MM: Philip Hernandez is a 64 year old male with a history of paresthesias in the feet.  He returns today for follow-up.  He remains on gabapentin 800 mg 4 times a day.  He states that there are times that he will miss doses and his pain worsens.  Patient was also advised to start taking Topamax consistently.  He states that he is taking  it more than he was but not necessarily consistently.  He states that he always has burning and tingling in the feet although most the time it is tolerable during the day unless he misses a dose of gabapentin.  He denies any significant changes with his gait or balance.  He does state that he is off balance at times.  Fortunately he has not had any recent falls.  He returns today for evaluation.   HISTORY 02/03/18: Philip Hernandez is a 64 year old male with a history of paresthesias in the feet.  He returns today for follow-up.  He is currently on gabapentin 800 mg 4 times a day.  He reports that this work well most the time.  He states  recently he has noticed  more pain in the legs.  He states that he does not take Topamax consistently.  Reports that he went to [place] recently and he did take Topamax daily and he felt that his pain was under better control.  Denies any significant changes with his gait or balance.  Denies any falls.  He returns today for evaluation.   REVIEW OF SYSTEMS: Out of a complete 14 system review of symptoms, the patient complains only of the following symptoms, and all other reviewed systems are negative.  See HPI  ALLERGIES: Allergies  Allergen Reactions  . Corn-Containing Products     HOME MEDICATIONS: Outpatient Medications Prior to Visit  Medication Sig Dispense Refill  . acetaminophen (TYLENOL) 500 MG tablet Take 500 mg by mouth every 6 (six) hours as needed.    . benzonatate (TESSALON) 200 MG capsule TAKE ONE (1) CAPSULE THREE (3) TIMES EACH DAY AS NEEDED 30 capsule 1  . cetirizine (ZYRTEC) 10 MG tablet Take 10 mg by mouth daily.    . cholecalciferol (VITAMIN D) 1000 UNITS tablet Take 1,000 Units by mouth daily.      Marland Kitchen gabapentin (NEURONTIN) 800 MG tablet TAKE 1 TABLET BY MOUTH 4  TIMES DAILY 360 tablet 3  . Multiple Vitamins-Minerals (AIRBORNE PO) Take by mouth daily.    Marland Kitchen omeprazole (PRILOSEC) 40 MG capsule Take 30- 60 min before your first and last meals of the day 180 capsule 3  . albuterol (VENTOLIN HFA) 108 (90 Base) MCG/ACT inhaler Inhale 2 puffs into the lungs every 6 (six) hours as needed for wheezing or shortness of breath. (Patient not taking: Reported on 02/02/2020) 18 g 1   No facility-administered medications prior to visit.    PAST MEDICAL HISTORY: Past Medical History:  Diagnosis Date  . Allergy   . Arthritis   . Cataract   . Colon polyp   . Colon polyps 2015  . Cough    chronic sinus issues, GERD with poor dietary control and Pulmonary Sarcoid.   Marland Kitchen GERD (gastroesophageal reflux disease) Jan 2012   [name]. Bx proven.  Deve abd pain in Jan 2012 following high  dose steroids for sarcoid. s/p EGD and improvement in symptoms following PPI  . Hypogonadism male   . Kidney stone   . Malignant neoplasm of prostate [facility]) dec 2010   [name]. s/p surgery dec 2010, hx of rnormal PSA  since then  . Other and unspecified mycoses   . Painful respiration   . Peripheral neuropathy   . Prostate cancer ([place])    reoccurence (getting radiation 01-14-17 start).  . Sarcoidosis dec 2011   stage 2 pulmonary, neck nodes, splenic granuloma  . Skin cancer   . Swelling, mass, or lump in  head and neck     PAST SURGICAL HISTORY: Past Surgical History:  Procedure Laterality Date  . HERNIA REPAIR  13 yrs ago  . HERNIA REPAIR    . LYMPH NODE BIOPSY  05/2010, 07/2010  . nasal polyps  1984  . NASAL SINUS SURGERY  2013  . PROSTATE SURGERY  2010   radiation 2018  . SHOULDER SURGERY Right 2009, 2010   x 2  . SKIN CANCER EXCISION  11/16/14    FAMILY HISTORY: Family History  Problem Relation Age of Onset  . Colon cancer Mother 16  . Skin cancer Mother   . Cancer Mother        colon  . Colon cancer Brother 27  . Skin cancer Brother   . Cancer Brother        colon  . Colon cancer Maternal Aunt        dx in her 62s  . Cancer Maternal Aunt        "cancer of male organs" dx in her 63s  . Breast cancer Maternal Aunt 66       bilateral, dx again at 57  . Colon cancer Maternal Aunt 81  . Stomach cancer Maternal Aunt 68  . Dementia Father   . Stomach cancer Maternal Uncle 10  . Skin cancer Paternal Aunt   . Prostate cancer Paternal Uncle 78  . Cancer Paternal Uncle        prostate  . Stomach cancer Paternal Grandmother        dx in her 16s  . Stroke Paternal Grandfather   . Colon cancer Maternal Aunt 68  . Prostate cancer Paternal Uncle   . Cancer Paternal Uncle        prostate  . Prostate cancer Paternal Uncle   . Kidney cancer Paternal Uncle   . Lung cancer Paternal Uncle   . Cancer Paternal Uncle        prostate  . Lung cancer Paternal Uncle    . Cancer Paternal Uncle        unknown  . Cancer Paternal Uncle        prostate  . Cancer Cousin        paternal cousin with cancer NOS  . Cancer Cousin        paternal cousin with cancer NOS  . Esophageal cancer Neg Hx   . Rectal cancer Neg Hx     SOCIAL HISTORY: Social History   Socioeconomic History  . Marital status: Married    Spouse name: Not on file  . Number of children: 2  . Years of education: Not on file  . Highest education level: Not on file  Occupational History  . Occupation: unemployed  Tobacco Use  . Smoking status: Never Smoker  . Smokeless tobacco: Never Used  Vaping Use  . Vaping Use: Never used  Substance and Sexual Activity  . Alcohol use: No  . Drug use: No  . Sexual activity: Yes  Other Topics Concern  . Not on file  Social History Narrative   Married   2 children   Social Determinants of Health   Financial Resource Strain:   . Difficulty of Paying Living Expenses:   Food Insecurity:   . Worried About Charity fundraiser in the Last Year:   . Arboriculturist in the Last Year:   Transportation Needs:   . Film/video editor (Medical):   Marland Kitchen Lack of Transportation (Non-Medical):   Physical Activity:   .  Days of Exercise per Week:   . Minutes of Exercise per Session:   Stress:   . Feeling of Stress :   Social Connections:   . Frequency of Communication with Friends and Family:   . Frequency of Social Gatherings with Friends and Family:   . Attends Religious Services:   . Active Member of Clubs or Organizations:   . Attends Archivist Meetings:   Marland Kitchen Marital Status:   Intimate Partner Violence:   . Fear of Current or Ex-Partner:   . Emotionally Abused:   Marland Kitchen Physically Abused:   . Sexually Abused:       PHYSICAL EXAM  Vitals:   03/15/20 1032  BP: 136/84  Pulse: 77  Weight: 177 lb (80.3 kg)  Height: 5\' 10"  (1.778 m)   Body mass index is 25.4 kg/m.  Generalized: Pleasant middle-aged Caucasian male, in no acute  distress   Neurological examination  Mentation: Alert oriented to time, place, history taking. Follows all commands speech and language fluent Cranial nerve II-XII: Pupils were equal round reactive to light. Extraocular movements were full, visual field were full on confrontational test. Facial sensation and strength were normal. Uvula tongue midline. Head turning and shoulder shrug  were normal and symmetric. Motor: The motor testing reveals 5 over 5 strength of all 4 extremities. Good symmetric motor tone is noted throughout.  Sensory: Sensory testing decreased vibratory sensation distal bilateral lower extremities.. No evidence of extinction is noted.  Slight hyperesthesia over toes bilaterally. Coordination: Cerebellar testing reveals good finger-nose-finger and heel-to-shin bilaterally.  Gait and station: Gait is normal. Tandem gait is slightly unsteady.  Romberg is negative. No drift is seen.  Reflexes: Deep tendon reflexes are symmetric and normal bilaterally.   DIAGNOSTIC DATA (LABS, IMAGING, TESTING) - I reviewed patient records, labs, notes, testing and imaging myself where available.  Lab Results  Component Value Date   WBC 7.4 12/07/2019   HGB 15.4 12/07/2019   HCT 45.0 12/07/2019   MCV 84 12/07/2019   PLT 163 12/07/2019      Component Value Date/Time   NA 143 12/07/2019 1441   K 5.5 (H) 12/07/2019 1441   CL 104 12/07/2019 1441   CO2 26 12/07/2019 1441   GLUCOSE 81 12/07/2019 1441   GLUCOSE 80 03/26/2013 1220   BUN 16 12/07/2019 1441   CREATININE 1.15 12/07/2019 1441   CREATININE 1.15 03/26/2013 1220   CALCIUM 10.2 12/07/2019 1441   PROT 6.7 12/07/2019 1441   ALBUMIN 4.5 12/07/2019 1441   AST 13 12/07/2019 1441   ALT 11 12/07/2019 1441   ALKPHOS 95 12/07/2019 1441   BILITOT 0.6 12/07/2019 1441   GFRNONAA 68 12/07/2019 1441   GFRNONAA 71 03/26/2013 1220   GFRAA 78 12/07/2019 1441   GFRAA 82 03/26/2013 1220   Lab Results  Component Value Date   CHOL 177  12/07/2019   HDL 41 12/07/2019   LDLCALC 112 (H) 12/07/2019   TRIG 132 12/07/2019   CHOLHDL 4.3 12/07/2019   No results found for: HGBA1C No results found for: VITAMINB12 Lab Results  Component Value Date   TSH 2.960 12/07/2019      ASSESSMENT AND PLAN 64 y.o. year old male  has a past medical history of Allergy, Arthritis, Cataract, Colon polyp, Colon polyps (2015), Cough, GERD (gastroesophageal reflux disease) (Jan 2012), Hypogonadism male, Kidney stone, Malignant neoplasm of prostate ([place]) (dec 2010), Other and unspecified mycoses, Painful respiration, Peripheral neuropathy, Prostate cancer ([place]), Sarcoidosis (dec 2011), Skin cancer, and Swelling,  mass, or lump in head and neck. here with :  1.  Paresthesias in the lower extremities from small fiber neuropathy related to sarcoid  -Continue gabapentin 800 mg 4 times daily  -Unable to tolerate Topamax due to worsening of vision  -Not interested in any additional medication management   Follow-up in 6 months or call earlier if needed  I spent 25 minutes of face-to-face and non-face-to-face time with patient.  This included previsit chart review, lab review, study review, order entry, electronic health record documentation, patient education   Frann Rider, [facility]  [facility] [address] [place], [place] [address]  Phone [phone] Fax [phone] Note: This document was prepared with digital dictation and possible smart phrase technology. Any transcriptional errors that result from this process are unintentional.

## 2020-03-15 NOTE — Progress Notes (Signed)
I agree with the above plan 

## 2020-03-15 NOTE — Patient Instructions (Addendum)
Your Plan:  Continue gabapentin 800mg  four times daily   Use of some over the counter creams such as Voltaren gels, salonpas etc and use of supplements like B12 and omega 3 can be beneficial     Follow up in 6 months or call earlier if needed      Thank you for coming to see [place] at [facility]. I hope we have been able to provide you high quality care today.  You may receive a patient satisfaction survey over the next few weeks. We would appreciate your feedback and comments so that we may continue to improve ourselves and the health of our patients.

## 2020-03-22 DIAGNOSIS — L03031 Cellulitis of right toe: Secondary | ICD-10-CM | POA: Diagnosis not present

## 2020-03-22 DIAGNOSIS — B351 Tinea unguium: Secondary | ICD-10-CM | POA: Diagnosis not present

## 2020-03-24 DIAGNOSIS — L570 Actinic keratosis: Secondary | ICD-10-CM | POA: Diagnosis not present

## 2020-03-24 DIAGNOSIS — D1801 Hemangioma of skin and subcutaneous tissue: Secondary | ICD-10-CM | POA: Diagnosis not present

## 2020-03-24 DIAGNOSIS — L218 Other seborrheic dermatitis: Secondary | ICD-10-CM | POA: Diagnosis not present

## 2020-03-24 DIAGNOSIS — D225 Melanocytic nevi of trunk: Secondary | ICD-10-CM | POA: Diagnosis not present

## 2020-03-24 DIAGNOSIS — I8393 Asymptomatic varicose veins of bilateral lower extremities: Secondary | ICD-10-CM | POA: Diagnosis not present

## 2020-03-24 DIAGNOSIS — L821 Other seborrheic keratosis: Secondary | ICD-10-CM | POA: Diagnosis not present

## 2020-03-24 DIAGNOSIS — L814 Other melanin hyperpigmentation: Secondary | ICD-10-CM | POA: Diagnosis not present

## 2020-03-25 DIAGNOSIS — C61 Malignant neoplasm of prostate: Secondary | ICD-10-CM | POA: Diagnosis not present

## 2020-04-01 DIAGNOSIS — C61 Malignant neoplasm of prostate: Secondary | ICD-10-CM | POA: Diagnosis not present

## 2020-04-01 DIAGNOSIS — N393 Stress incontinence (female) (male): Secondary | ICD-10-CM | POA: Diagnosis not present

## 2020-04-05 DIAGNOSIS — M7531 Calcific tendinitis of right shoulder: Secondary | ICD-10-CM | POA: Diagnosis not present

## 2020-05-03 DIAGNOSIS — M7531 Calcific tendinitis of right shoulder: Secondary | ICD-10-CM | POA: Diagnosis not present

## 2020-06-07 ENCOUNTER — Other Ambulatory Visit: Payer: BC Managed Care – PPO

## 2020-06-07 ENCOUNTER — Other Ambulatory Visit: Payer: Self-pay

## 2020-06-07 DIAGNOSIS — E782 Mixed hyperlipidemia: Secondary | ICD-10-CM

## 2020-06-07 DIAGNOSIS — K219 Gastro-esophageal reflux disease without esophagitis: Secondary | ICD-10-CM

## 2020-06-07 DIAGNOSIS — D8689 Sarcoidosis of other sites: Secondary | ICD-10-CM

## 2020-06-07 DIAGNOSIS — R6889 Other general symptoms and signs: Secondary | ICD-10-CM | POA: Diagnosis not present

## 2020-06-08 LAB — CMP14+EGFR
ALT: 18 IU/L (ref 0–44)
AST: 21 IU/L (ref 0–40)
Albumin/Globulin Ratio: 1.8 (ref 1.2–2.2)
Albumin: 4.5 g/dL (ref 3.8–4.8)
Alkaline Phosphatase: 84 IU/L (ref 44–121)
BUN/Creatinine Ratio: 13 (ref 10–24)
BUN: 14 mg/dL (ref 8–27)
Bilirubin Total: 0.6 mg/dL (ref 0.0–1.2)
CO2: 27 mmol/L (ref 20–29)
Calcium: 9.8 mg/dL (ref 8.6–10.2)
Chloride: 106 mmol/L (ref 96–106)
Creatinine, Ser: 1.1 mg/dL (ref 0.76–1.27)
GFR calc Af Amer: 82 mL/min/{1.73_m2} (ref >59)
GFR calc non Af Amer: 71 mL/min/{1.73_m2} (ref >59)
Globulin, Total: 2.5 g/dL (ref 1.5–4.5)
Glucose: 80 mg/dL (ref 65–99)
Potassium: 5.2 mmol/L (ref 3.5–5.2)
Sodium: 142 mmol/L (ref 134–144)
Total Protein: 7 g/dL (ref 6.0–8.5)

## 2020-06-08 LAB — CBC WITH DIFFERENTIAL/PLATELET
Basophils Absolute: 0.1 10*3/uL (ref 0.0–0.2)
Basos: 1 %
EOS (ABSOLUTE): 0.1 10*3/uL (ref 0.0–0.4)
Eos: 2 %
Hematocrit: 45.8 % (ref 37.5–51.0)
Hemoglobin: 15.6 g/dL (ref 13.0–17.7)
Immature Grans (Abs): 0.1 10*3/uL (ref 0.0–0.1)
Immature Granulocytes: 1 %
Lymphocytes Absolute: 1.8 10*3/uL (ref 0.7–3.1)
Lymphs: 28 %
MCH: 29.5 pg (ref 26.6–33.0)
MCHC: 34.1 g/dL (ref 31.5–35.7)
MCV: 87 fL (ref 79–97)
Monocytes Absolute: 0.6 10*3/uL (ref 0.1–0.9)
Monocytes: 9 %
Neutrophils Absolute: 3.9 10*3/uL (ref 1.4–7.0)
Neutrophils: 59 %
Platelets: 148 10*3/uL — ABNORMAL LOW (ref 150–450)
RBC: 5.29 x10E6/uL (ref 4.14–5.80)
RDW: 12.7 % (ref 11.6–15.4)
WBC: 6.6 10*3/uL (ref 3.4–10.8)

## 2020-06-08 LAB — LIPID PANEL
Chol/HDL Ratio: 4 ratio (ref 0.0–5.0)
Cholesterol, Total: 201 mg/dL — ABNORMAL HIGH (ref 100–199)
HDL: 50 mg/dL (ref >39)
LDL Chol Calc (NIH): 132 mg/dL — ABNORMAL HIGH (ref 0–99)
Triglycerides: 106 mg/dL (ref 0–149)
VLDL Cholesterol Cal: 19 mg/dL (ref 5–40)

## 2020-06-08 LAB — THYROID PANEL WITH TSH
Free Thyroxine Index: 1.6 (ref 1.2–4.9)
T3 Uptake Ratio: 26 % (ref 24–39)
T4, Total: 6 ug/dL (ref 4.5–12.0)
TSH: 1.75 u[IU]/mL (ref 0.450–4.500)

## 2020-06-13 ENCOUNTER — Ambulatory Visit (INDEPENDENT_AMBULATORY_CARE_PROVIDER_SITE_OTHER): Payer: BC Managed Care – PPO | Admitting: Family Medicine

## 2020-06-13 ENCOUNTER — Other Ambulatory Visit: Payer: Self-pay

## 2020-06-13 ENCOUNTER — Encounter: Payer: Self-pay | Admitting: Family Medicine

## 2020-06-13 VITALS — BP 135/74 | HR 41 | Temp 97.7°F | Ht 70.0 in | Wt 177.0 lb

## 2020-06-13 DIAGNOSIS — K219 Gastro-esophageal reflux disease without esophagitis: Secondary | ICD-10-CM

## 2020-06-13 DIAGNOSIS — E782 Mixed hyperlipidemia: Secondary | ICD-10-CM

## 2020-06-13 DIAGNOSIS — Z23 Encounter for immunization: Secondary | ICD-10-CM | POA: Diagnosis not present

## 2020-06-13 NOTE — Progress Notes (Signed)
BP 135/74   Pulse (!) 41   Temp 97.7 F (36.5 C)   Ht _0  (1.778 m)   Wt 177 lb (80.3 kg)   SpO2 100%   BMI 25.40 kg/m    Subjective:   Patient ID: Philip Hernandez, male    DOB: [birth year], 64 y.o.   MRN: [id number]  HPI: Philip Hernandez is a 64 y.o. male presenting on 06/13/2020 for Medical Management of Chronic Issues and Hyperlipidemia   HPI Hyperlipidemia Patient is coming in for recheck of his hyperlipidemia. The patient is currently taking none currently. They deny any issues with myalgias or history of liver damage from it. They deny any focal numbness or weakness or chest pain.   GERD Patient is currently on omeprazole.  She denies any major symptoms or abdominal pain or belching or burping. She denies any blood in her stool or lightheadedness or dizziness.   Relevant past medical, surgical, family and social history reviewed and updated as indicated. Interim medical history since our last visit reviewed. Allergies and medications reviewed and updated.  Review of Systems  Constitutional: Negative for chills and fever.  Respiratory: Negative for shortness of breath and wheezing.   Cardiovascular: Negative for chest pain and leg swelling.  Musculoskeletal: Negative for back pain and gait problem.  Skin: Negative for rash.  Neurological: Negative for dizziness, weakness and light-headedness.  All other systems reviewed and are negative.   Per HPI unless specifically indicated above   Allergies as of 06/13/2020      Reactions   Corn-containing Products       Medication List       Accurate as of June 13, 2020 11:03 AM. If you have any questions, ask your nurse or doctor.        acetaminophen 500 MG tablet Commonly known as: TYLENOL Take 500 mg by mouth every 6 (six) hours as needed.   AIRBORNE PO Take by mouth daily.   benzonatate 200 MG capsule Commonly known as: TESSALON TAKE ONE (1) CAPSULE THREE (3) TIMES EACH DAY AS NEEDED   cetirizine 10 MG  tablet Commonly known as: ZYRTEC Take 10 mg by mouth daily.   cholecalciferol 1000 units tablet Commonly known as: VITAMIN D Take 1,000 Units by mouth daily.   fluticasone 0.05 % cream Commonly known as: CUTIVATE Apply 1 application topically daily.   gabapentin 800 MG tablet Commonly known as: NEURONTIN TAKE 1 TABLET BY MOUTH 4  TIMES DAILY   ketoconazole 2 % cream Commonly known as: NIZORAL Apply 1 application topically as needed.   omeprazole 40 MG capsule Commonly known as: PRILOSEC Take 30- 60 min before your first and last meals of the day        Objective:   BP 135/74   Pulse (!) 41   Temp 97.7 F (36.5 C)   Ht _1  (1.778 m)   Wt 177 lb (80.3 kg)   SpO2 100%   BMI 25.40 kg/m   Wt Readings from Last 3 Encounters:  06/13/20 177 lb (80.3 kg)  03/15/20 177 lb (80.3 kg)  12/10/19 170 lb (77.1 kg)    Physical Exam Vitals and nursing note reviewed.  Constitutional:      General: He is not in acute distress.    Appearance: He is well-developed. He is not diaphoretic.  Eyes:     General: No scleral icterus.    Conjunctiva/sclera: Conjunctivae normal.  Neck:     Thyroid: No thyromegaly.  Cardiovascular:  Rate and Rhythm: Normal rate and regular rhythm.     Heart sounds: Normal heart sounds. No murmur heard.   Pulmonary:     Effort: Pulmonary effort is normal. No respiratory distress.     Breath sounds: Normal breath sounds. No wheezing.  Musculoskeletal:        General: Normal range of motion.     Cervical back: Neck supple.  Lymphadenopathy:     Cervical: No cervical adenopathy.  Skin:    General: Skin is warm and dry.     Findings: No rash.  Neurological:     Mental Status: He is alert and oriented to person, place, and time.     Coordination: Coordination normal.  Psychiatric:        Behavior: Behavior normal.     Results for orders placed or performed in visit on 06/07/20  Thyroid Panel With TSH  Result Value Ref Range   TSH 1.750  0.450 - 4.500 uIU/mL   T4, Total 6.0 4.5 - 12.0 ug/dL   T3 Uptake Ratio 26 24 - 39 %   Free Thyroxine Index 1.6 1.2 - 4.9  Lipid panel  Result Value Ref Range   Cholesterol, Total 201 (H) 100 - 199 mg/dL   Triglycerides 106 0 - 149 mg/dL   HDL 50 >39 mg/dL   VLDL Cholesterol Cal 19 5 - 40 mg/dL   LDL Chol Calc (NIH) 132 (H) 0 - 99 mg/dL   Chol/HDL Ratio 4.0 0.0 - 5.0 ratio  CMP14+EGFR  Result Value Ref Range   Glucose 80 65 - 99 mg/dL   BUN 14 8 - 27 mg/dL   Creatinine, Ser 1.10 0.76 - 1.27 mg/dL   GFR calc non Af Amer 71 >59 mL/min/1.73   GFR calc Af Amer 82 >59 mL/min/1.73   BUN/Creatinine Ratio 13 10 - 24   Sodium 142 134 - 144 mmol/L   Potassium 5.2 3.5 - 5.2 mmol/L   Chloride 106 96 - 106 mmol/L   CO2 27 20 - 29 mmol/L   Calcium 9.8 8.6 - 10.2 mg/dL   Total Protein 7.0 6.0 - 8.5 g/dL   Albumin 4.5 3.8 - 4.8 g/dL   Globulin, Total 2.5 1.5 - 4.5 g/dL   Albumin/Globulin Ratio 1.8 1.2 - 2.2   Bilirubin Total 0.6 0.0 - 1.2 mg/dL   Alkaline Phosphatase 84 44 - 121 IU/L   AST 21 0 - 40 IU/L   ALT 18 0 - 44 IU/L  CBC with Differential/Platelet  Result Value Ref Range   WBC 6.6 3.4 - 10.8 x10E3/uL   RBC 5.29 4.14 - 5.80 x10E6/uL   Hemoglobin 15.6 13.0 - 17.7 g/dL   Hematocrit 45.8 37.5 - 51.0 %   MCV 87 79 - 97 fL   MCH 29.5 26.6 - 33.0 pg   MCHC 34.1 31 - 35 g/dL   RDW 12.7 11.6 - 15.4 %   Platelets 148 (L) 150 - 450 x10E3/uL   Neutrophils 59 Not Estab. %   Lymphs 28 Not Estab. %   Monocytes 9 Not Estab. %   Eos 2 Not Estab. %   Basos 1 Not Estab. %   Neutrophils Absolute 3.9 1 - 7 x10E3/uL   Lymphocytes Absolute 1.8 0 - 3 x10E3/uL   Monocytes Absolute 0.6 0 - 0 x10E3/uL   EOS (ABSOLUTE) 0.1 0.0 - 0.4 x10E3/uL   Basophils Absolute 0.1 0 - 0 x10E3/uL   Immature Granulocytes 1 Not Estab. %   Immature Grans (Abs) 0.1  0.0 - 0.1 x10E3/uL    Assessment & Plan:   Problem List Items Addressed This Visit      Digestive   GERD   Relevant Orders   CBC with  Differential/Platelet     Other   HLD (hyperlipidemia) - Primary   Relevant Orders   CMP14+EGFR   Lipid panel    Other Visit Diagnoses    Flu vaccine need       Relevant Orders   Flu Vaccine QUAD 6+ mos PF IM (Fluarix Quad PF) (Completed)      Patient is doing well except cholesterol, he will do diet and exercise and then follow-up in 6 months and see if the numbers improved. Follow up plan: Return in about 6 months (around 12/11/2020), or if symptoms worsen or fail to improve, for Hypertension and GERD and hyperlipidemia follow-up.  Counseling provided for all of the vaccine components Orders Placed This Encounter  Procedures  . Flu Vaccine QUAD 6+ mos PF IM (Fluarix Quad PF)    [name], MD [facility] 06/13/2020, 11:03 AM

## 2020-06-14 DIAGNOSIS — M7531 Calcific tendinitis of right shoulder: Secondary | ICD-10-CM | POA: Diagnosis not present

## 2020-09-10 ENCOUNTER — Other Ambulatory Visit: Payer: Self-pay | Admitting: Neurology

## 2020-09-15 ENCOUNTER — Ambulatory Visit: Payer: BC Managed Care – PPO | Admitting: Adult Health

## 2020-09-20 ENCOUNTER — Other Ambulatory Visit: Payer: Self-pay

## 2020-09-20 ENCOUNTER — Ambulatory Visit (INDEPENDENT_AMBULATORY_CARE_PROVIDER_SITE_OTHER): Payer: BC Managed Care – PPO | Admitting: Adult Health

## 2020-09-20 ENCOUNTER — Encounter: Payer: Self-pay | Admitting: Adult Health

## 2020-09-20 VITALS — BP 127/78 | HR 61 | Ht 70.0 in | Wt 184.6 lb

## 2020-09-20 DIAGNOSIS — R202 Paresthesia of skin: Secondary | ICD-10-CM | POA: Diagnosis not present

## 2020-09-20 MED ORDER — GABAPENTIN 800 MG PO TABS
800.0000 mg | ORAL_TABLET | Freq: Four times a day (QID) | ORAL | 3 refills | Status: DC
Start: 2020-09-20 — End: 2021-09-12

## 2020-09-20 NOTE — Patient Instructions (Signed)
Your Plan:  Continue gabapentin 800mg  4 times daily for neuropathy    Follow up in 6 months or call earlier if needed     Thank you for coming to see at [facility]. I hope we have been able to provide you high quality care today.  You may receive a patient satisfaction survey over the next few weeks. We would appreciate your feedback and comments so that we may continue to improve ourselves and the health of our patients.

## 2020-09-20 NOTE — Progress Notes (Signed)
PATIENT: Philip Hernandez DOB: 1956-07-08   [facility] provider: Dr. Pearlean Brownie REASON FOR VISIT: Neuropathy HISTORY FROM: patient    Chief complaint: Chief Complaint  Patient presents with  . Follow-up    Feels gabapentin 800mg  QID keeps his symptoms tolerable. He sometimes has difficulty remembering to take it four times daily. He has been setting an alarm which helps.       HISTORY OF PRESENT ILLNESS:  Today, 09/20/2020, [name] returns for 57-month follow-up regarding neuropathy.  Remains on gabapentin 800 mg 4 times daily tolerating well without side effects.  Neuropathy has been stable and gabapentin continues to provide benefit.  He will have occasional difficulty remembering to take all 4 doses and will experience worsening symptoms if he misses a dose.  He has been keeping active with routine activity and exercise in fact neuropathy symptoms can worsen after sitting for prolonged period of time. He will have occasional difficulty holding or picking up objects but relatively able to maintain ADLs and IADLs independently.  He does have occasional balance difficulties but overall stable.   History provided for reference purposes only Update 03/15/2020 JM:, [name] returns for follow-up for neuropathy with paresthesias.  He has been stable since prior visit 6 months ago with unchanged symptoms of numbness/tingling and paresthesias in bilateral lower extremities.  He continues to experience good days and bad days.  Continues on gabapentin 800 mg 4 times daily tolerating dosage well. At times, may forget to take day time dosage and will notice worsening in symptoms.  He has not trialed additional dose of gabapentin with worsening symptoms as previously discussed with Dr. Katrinka Blazing. No further concerns at this time.  Update 09/15/2019 Dr. 09/17/2019 : He returns for follow-up after last virtual video visit on 03/02/2019.  He continues to have tingling numbness and paresthesias in his feet which are more or  less unchanged.  He has good days and bad days.  Physical activity does increase it.  Patient was started on Topamax previously and I will increase the dose to 50 mg twice daily but he had trouble tolerating this and stated that he could not focus with his eyes and had trouble reading and had to rub his eyes.  After taking it for 4 to 6 weeks he discontinued it.  He did note that increasing the dose of Topamax did help to some degree.  He remains on gabapentin and the current dose of 800 mg 4 times daily.  He has not tried Lyrica or Tegretol yet but does not want to try new medication.  Patient was diagnosed with Covid infection 3 weeks ago and did self quarantine himself and now has started going out last few days.  He is currently on prednisone taper as he also has sarcoidosis.  He does complain of feeling tired and having a minimum cough but is otherwise doing well.  His paresthesias are constant but is able to walk well he will have no problems with falls or balance issues.  Update via virtual visit 03/02/2019 Dr. 03/04/2019: [name] is seen today for virtual video office follow-up visit following last visit with Katrinka Blazing, nurse practitioner on 08/25/2018.  He states he is doing about the same.  He still has paresthesias in his feet off and on.  He has good days and bad days.  He remains on Topamax 25 mg twice daily which is tolerating well but he is states if not sure if it is doing anything.  He also  remains on gabapentin 800 mg 4 times daily and he does not miss a dose.  He continues to have gait and balance difficulties but is able to catch himself and has not fallen or hurt himself.  He does not use a cane or walker.  He recently got handicap parking sticker renewed by me.  He has no new neurological complaints.  Update 08/25/2018 MM: [name] is a 65 year old male with a history of paresthesias in the feet.  He returns today for follow-up.  He remains on gabapentin 800 mg 4 times a day.  He states  that there are times that he will miss doses and his pain worsens.  Patient was also advised to start taking Topamax consistently.  He states that he is taking it more than he was but not necessarily consistently.  He states that he always has burning and tingling in the feet although most the time it is tolerable during the day unless he misses a dose of gabapentin.  He denies any significant changes with his gait or balance.  He does state that he is off balance at times.  Fortunately he has not had any recent falls.  He returns today for evaluation.   HISTORY 02/03/18: [name] is a 65 year old male with a history of paresthesias in the feet.  He returns today for follow-up.  He is currently on gabapentin 800 mg 4 times a day.  He reports that this work well most the time.  He states recently he has noticed  more pain in the legs.  He states that he does not take Topamax consistently.  Reports that he went to [place] recently and he did take Topamax daily and he felt that his pain was under better control.  Denies any significant changes with his gait or balance.  Denies any falls.  He returns today for evaluation.   REVIEW OF SYSTEMS: Out of a complete 14 system review of symptoms, the patient complains only of the following symptoms, and all other reviewed systems are negative.  See HPI  ALLERGIES: Allergies  Allergen Reactions  . Corn-Containing Products     HOME MEDICATIONS: Outpatient Medications Prior to Visit  Medication Sig Dispense Refill  . acetaminophen (TYLENOL) 500 MG tablet Take 500 mg by mouth every 6 (six) hours as needed.    . benzonatate (TESSALON) 200 MG capsule TAKE ONE (1) CAPSULE THREE (3) TIMES EACH DAY AS NEEDED 30 capsule 1  . cetirizine (ZYRTEC) 10 MG tablet Take 10 mg by mouth daily.    . cholecalciferol (VITAMIN D) 1000 UNITS tablet Take 1,000 Units by mouth daily.    . fluticasone (CUTIVATE) 0.05 % cream Apply 1 application topically daily.    Marland Kitchen gabapentin  (NEURONTIN) 800 MG tablet TAKE 1 TABLET BY MOUTH 4  TIMES DAILY 360 tablet 1  . ketoconazole (NIZORAL) 2 % cream Apply 1 application topically as needed.    . Multiple Vitamins-Minerals (AIRBORNE PO) Take by mouth daily.    Marland Kitchen omeprazole (PRILOSEC) 40 MG capsule Take 30- 60 min before your first and last meals of the day 180 capsule 3   No facility-administered medications prior to visit.    PAST MEDICAL HISTORY: Past Medical History:  Diagnosis Date  . Allergy   . Arthritis   . Cataract   . Colon polyp   . Colon polyps 2015  . Cough    chronic sinus issues, GERD with poor dietary control and Pulmonary Sarcoid.   Marland Kitchen GERD (gastroesophageal reflux  disease) Jan 2012   [name]. Bx proven.  Deve abd pain in Jan 2012 following high dose steroids for sarcoid. s/p EGD and improvement in symptoms following PPI  . Hypogonadism male   . Kidney stone   . Malignant neoplasm of prostate [facility]) dec 2010   [name]. s/p surgery dec 2010, hx of rnormal PSA  since then  . Other and unspecified mycoses   . Painful respiration   . Peripheral neuropathy   . Prostate cancer ([place])    reoccurence (getting radiation 01-14-17 start).  . Sarcoidosis dec 2011   stage 2 pulmonary, neck nodes, splenic granuloma  . Skin cancer   . Swelling, mass, or lump in head and neck     PAST SURGICAL HISTORY: Past Surgical History:  Procedure Laterality Date  . HERNIA REPAIR  13 yrs ago  . HERNIA REPAIR    . LYMPH NODE BIOPSY  05/2010, 07/2010  . nasal polyps  1984  . NASAL SINUS SURGERY  2013  . PROSTATE SURGERY  2010   radiation 2018  . SHOULDER SURGERY Right 2009, 2010   x 2  . SKIN CANCER EXCISION  11/16/14    FAMILY HISTORY: Family History  Problem Relation Age of Onset  . Colon cancer Mother 15  . Skin cancer Mother   . Cancer Mother        colon  . Colon cancer Brother 16  . Skin cancer Brother   . Cancer Brother        colon  . Colon cancer Maternal Aunt        dx in her 55s  . Cancer  Maternal Aunt        "cancer of male organs" dx in her 64s  . Breast cancer Maternal Aunt 66       bilateral, dx again at 53  . Colon cancer Maternal Aunt 71  . Stomach cancer Maternal Aunt 68  . Dementia Father   . Stomach cancer Maternal Uncle 40  . Skin cancer Paternal Aunt   . Prostate cancer Paternal Uncle 26  . Cancer Paternal Uncle        prostate  . Stomach cancer Paternal Grandmother        dx in her 46s  . Stroke Paternal Grandfather   . Colon cancer Maternal Aunt 68  . Prostate cancer Paternal Uncle   . Cancer Paternal Uncle        prostate  . Prostate cancer Paternal Uncle   . Kidney cancer Paternal Uncle   . Lung cancer Paternal Uncle   . Cancer Paternal Uncle        prostate  . Lung cancer Paternal Uncle   . Cancer Paternal Uncle        unknown  . Cancer Paternal Uncle        prostate  . Cancer Cousin        paternal cousin with cancer NOS  . Cancer Cousin        paternal cousin with cancer NOS  . Esophageal cancer Neg Hx   . Rectal cancer Neg Hx     SOCIAL HISTORY: Social History   Socioeconomic History  . Marital status: Married    Spouse name: Not on file  . Number of children: 2  . Years of education: Not on file  . Highest education level: Not on file  Occupational History  . Occupation: unemployed  Tobacco Use  . Smoking status: Never Smoker  . Smokeless tobacco:  Never Used  Vaping Use  . Vaping Use: Never used  Substance and Sexual Activity  . Alcohol use: No  . Drug use: No  . Sexual activity: Yes  Other Topics Concern  . Not on file  Social History Narrative   Married   2 children   Social Determinants of Health   Financial Resource Strain: Not on file  Food Insecurity: Not on file  Transportation Needs: Not on file  Physical Activity: Not on file  Stress: Not on file  Social Connections: Not on file  Intimate Partner Violence: Not on file      PHYSICAL EXAM  Vitals:   09/20/20 1005  BP: 127/78  Pulse: 61   Weight: 184 lb 9.6 oz (83.7 kg)  Height: 5\' 10"  (1.778 m)   Body mass index is 26.49 kg/m.  Generalized: Pleasant middle-aged Caucasian male, in no acute distress   Neurological examination  Mentation: Alert oriented to time, place, history taking. Follows all commands speech and language fluent Cranial nerve II-XII: Pupils were equal round reactive to light. Extraocular movements were full, visual field were full on confrontational test. Facial sensation and strength were normal. Uvula tongue midline. Head turning and shoulder shrug  were normal and symmetric. Motor: The motor testing reveals 5 over 5 strength of all 4 extremities. Good symmetric motor tone is noted throughout.  Sensory: Sensory testing decreased vibratory sensation distal bilateral lower extremities.. No evidence of extinction is noted.  Slight hyperesthesia over toes bilaterally. Decreased sensation bilateral hands distally to pin prick, vibration and temperature Coordination: Cerebellar testing reveals good finger-nose-finger and heel-to-shin bilaterally.  Gait and station: Gait is normal. Tandem gait is slightly unsteady.  Romberg is negative. No drift is seen.  Reflexes: Deep tendon reflexes are symmetric and normal bilaterally.     DIAGNOSTIC DATA (LABS, IMAGING, TESTING) - I reviewed patient records, labs, notes, testing and imaging myself where available.  Lab Results  Component Value Date   WBC 6.6 06/07/2020   HGB 15.6 06/07/2020   HCT 45.8 06/07/2020   MCV 87 06/07/2020   PLT 148 (L) 06/07/2020      Component Value Date/Time   NA 142 06/07/2020 1421   K 5.2 06/07/2020 1421   CL 106 06/07/2020 1421   CO2 27 06/07/2020 1421   GLUCOSE 80 06/07/2020 1421   GLUCOSE 80 03/26/2013 1220   BUN 14 06/07/2020 1421   CREATININE 1.10 06/07/2020 1421   CREATININE 1.15 03/26/2013 1220   CALCIUM 9.8 06/07/2020 1421   PROT 7.0 06/07/2020 1421   ALBUMIN 4.5 06/07/2020 1421   AST 21 06/07/2020 1421   ALT 18  06/07/2020 1421   ALKPHOS 84 06/07/2020 1421   BILITOT 0.6 06/07/2020 1421   GFRNONAA 71 06/07/2020 1421   GFRNONAA 71 03/26/2013 1220   GFRAA 82 06/07/2020 1421   GFRAA 82 03/26/2013 1220   Lab Results  Component Value Date   CHOL 201 (H) 06/07/2020   HDL 50 06/07/2020   LDLCALC 132 (H) 06/07/2020   TRIG 106 06/07/2020   CHOLHDL 4.0 06/07/2020   No results found for: HGBA1C No results found for: VITAMINB12 Lab Results  Component Value Date   TSH 1.750 06/07/2020      ASSESSMENT AND PLAN 65 y.o. year old male  has a past medical history of Allergy, Arthritis, Cataract, Colon polyp, Colon polyps (2015), Cough, GERD (gastroesophageal reflux disease) (Jan 2012), Hypogonadism male, Kidney stone, Malignant neoplasm of prostate ([place]) (dec 2010), Other and unspecified mycoses, Painful respiration,  Peripheral neuropathy, Prostate cancer ([place]), Sarcoidosis (dec 2011), Skin cancer, and Swelling, mass, or lump in head and neck. here with :  1.  Paresthesias in the lower extremities from small fiber neuropathy possibly related to sarcoidosis  -Continue gabapentin 800 mg 4 times daily - refill provided  -Previously tried: Topamax and amitriptyline    Follow-up in 6 months or call earlier if needed   I spent 30 minutes of face-to-face and non-face-to-face time with patient.  This included previsit chart review, lab review, study review, order entry, electronic health record documentation, patient education and discussion regarding neuropathy and etiology, ongoing use of gabapentin and all other multiple questions to patients satisfaction   Frann Rider, AGNP-BC  [facility] [address] [place], [place] [address]  Phone [phone] Fax [phone] Note: This document was prepared with digital dictation and possible smart phrase technology. Any transcriptional errors that result from this process are unintentional.

## 2020-09-20 NOTE — Progress Notes (Signed)
I agree with the above plan 

## 2020-09-25 ENCOUNTER — Other Ambulatory Visit: Payer: Self-pay | Admitting: Internal Medicine

## 2020-09-26 ENCOUNTER — Other Ambulatory Visit: Payer: Self-pay | Admitting: Family Medicine

## 2020-10-14 DIAGNOSIS — C61 Malignant neoplasm of prostate: Secondary | ICD-10-CM | POA: Diagnosis not present

## 2020-10-21 DIAGNOSIS — N393 Stress incontinence (female) (male): Secondary | ICD-10-CM | POA: Diagnosis not present

## 2020-10-21 DIAGNOSIS — C61 Malignant neoplasm of prostate: Secondary | ICD-10-CM | POA: Diagnosis not present

## 2020-11-25 ENCOUNTER — Ambulatory Visit (INDEPENDENT_AMBULATORY_CARE_PROVIDER_SITE_OTHER): Payer: BC Managed Care – PPO | Admitting: Family Medicine

## 2020-11-25 ENCOUNTER — Encounter: Payer: Self-pay | Admitting: Family Medicine

## 2020-11-25 ENCOUNTER — Other Ambulatory Visit: Payer: Self-pay | Admitting: Family Medicine

## 2020-11-25 ENCOUNTER — Other Ambulatory Visit: Payer: Self-pay

## 2020-11-25 VITALS — BP 134/82 | HR 72 | Ht 70.0 in | Wt 180.0 lb

## 2020-11-25 DIAGNOSIS — S40261A Insect bite (nonvenomous) of right shoulder, initial encounter: Secondary | ICD-10-CM

## 2020-11-25 DIAGNOSIS — W57XXXA Bitten or stung by nonvenomous insect and other nonvenomous arthropods, initial encounter: Secondary | ICD-10-CM | POA: Diagnosis not present

## 2020-11-25 DIAGNOSIS — S40262A Insect bite (nonvenomous) of left shoulder, initial encounter: Secondary | ICD-10-CM

## 2020-11-25 NOTE — Progress Notes (Signed)
BP 134/82   Pulse 72   Ht 5\' 10"  (1.778 m)   Wt 180 lb (81.6 kg)   SpO2 98%   BMI 25.83 kg/m    Subjective:   Patient ID: Philip Hernandez, male    DOB: Jun 03, 1956, 65 y.o.   MRN: [id number]  HPI: Philip Hernandez is a 66 y.o. male presenting on 11/25/2020 for Tick Removal (Bite over the weekend. Removed Monday morning. Red and tender right shoulder blade.)   HPI Patient removed and tape from his right shoulder blade area 3 days ago and he just was worried about it wanted take a look at it.  He denies any fevers or chills or warmth.  He says it might be a little bit of redness around it just wanted to take a look at.  He says it was a very small tick and black with no dot on the back.  He denies any drainage from it and just says there is a little hard spot in the center where it was.  Relevant past medical, surgical, family and social history reviewed and updated as indicated. Interim medical history since our last visit reviewed. Allergies and medications reviewed and updated.  Review of Systems  Constitutional: Negative for chills and fever.  Respiratory: Negative for shortness of breath and wheezing.   Cardiovascular: Negative for chest pain and leg swelling.  Musculoskeletal: Negative for back pain and gait problem.  Skin: Positive for rash.  All other systems reviewed and are negative.   Per HPI unless specifically indicated above   Allergies as of 11/25/2020      Reactions   Corn-containing Products       Medication List       Accurate as of November 25, 2020  2:01 PM. If you have any questions, ask your nurse or doctor.        acetaminophen 500 MG tablet Commonly known as: TYLENOL Take 500 mg by mouth every 6 (six) hours as needed.   AIRBORNE PO Take by mouth daily.   benzonatate 200 MG capsule Commonly known as: TESSALON TAKE ONE (1) CAPSULE THREE (3) TIMES EACH DAY AS NEEDED   cetirizine 10 MG tablet Commonly known as: ZYRTEC Take 10 mg by mouth daily.    cholecalciferol 1000 units tablet Commonly known as: VITAMIN D Take 1,000 Units by mouth daily.   fluticasone 0.05 % cream Commonly known as: CUTIVATE Apply 1 application topically daily.   gabapentin 800 MG tablet Commonly known as: NEURONTIN Take 1 tablet (800 mg total) by mouth 4 (four) times daily.   ketoconazole 2 % cream Commonly known as: NIZORAL Apply 1 application topically as needed.   omeprazole 40 MG capsule Commonly known as: PRILOSEC TAKE ONE CAPSULE BY MOUTH TWICE A DAY        Objective:   BP 134/82   Pulse 72   Ht 5\' 10"  (1.778 m)   Wt 180 lb (81.6 kg)   SpO2 98%   BMI 25.83 kg/m   Wt Readings from Last 3 Encounters:  11/25/20 180 lb (81.6 kg)  09/20/20 184 lb 9.6 oz (83.7 kg)  06/13/20 177 lb (80.3 kg)    Physical Exam Vitals and nursing note reviewed.  Constitutional:      Appearance: Normal appearance. He is normal weight.  Skin:      Neurological:     Mental Status: He is alert.       Assessment & Plan:   Problem List Items Addressed This Visit  None   Visit Diagnoses    Tick bite of left shoulder, initial encounter    -  Primary      Recommended topical antibiotic cream and conservative management and watch for any rash or development, come back if anything develops.  Or call Follow up plan: Return if symptoms worsen or fail to improve.  Counseling provided for all of the vaccine components No orders of the defined types were placed in this encounter.   [name], MD [facility] 11/25/2020, 2:01 PM

## 2020-12-05 ENCOUNTER — Other Ambulatory Visit: Payer: Self-pay

## 2020-12-05 DIAGNOSIS — E782 Mixed hyperlipidemia: Secondary | ICD-10-CM

## 2020-12-05 DIAGNOSIS — E785 Hyperlipidemia, unspecified: Secondary | ICD-10-CM

## 2020-12-06 ENCOUNTER — Other Ambulatory Visit: Payer: BC Managed Care – PPO

## 2020-12-06 ENCOUNTER — Other Ambulatory Visit: Payer: Self-pay

## 2020-12-06 DIAGNOSIS — E785 Hyperlipidemia, unspecified: Secondary | ICD-10-CM | POA: Diagnosis not present

## 2020-12-07 LAB — CMP14+EGFR
ALT: 21 IU/L (ref 0–44)
AST: 23 IU/L (ref 0–40)
Albumin/Globulin Ratio: 2 (ref 1.2–2.2)
Albumin: 4.3 g/dL (ref 3.8–4.8)
Alkaline Phosphatase: 89 IU/L (ref 44–121)
BUN/Creatinine Ratio: 12 (ref 10–24)
BUN: 15 mg/dL (ref 8–27)
Bilirubin Total: 0.8 mg/dL (ref 0.0–1.2)
CO2: 23 mmol/L (ref 20–29)
Calcium: 9.7 mg/dL (ref 8.6–10.2)
Chloride: 105 mmol/L (ref 96–106)
Creatinine, Ser: 1.23 mg/dL (ref 0.76–1.27)
Globulin, Total: 2.2 g/dL (ref 1.5–4.5)
Glucose: 87 mg/dL (ref 65–99)
Potassium: 5.4 mmol/L — ABNORMAL HIGH (ref 3.5–5.2)
Sodium: 142 mmol/L (ref 134–144)
Total Protein: 6.5 g/dL (ref 6.0–8.5)
eGFR: 66 mL/min/{1.73_m2} (ref >59)

## 2020-12-07 LAB — CBC WITH DIFFERENTIAL/PLATELET
Basophils Absolute: 0 10*3/uL (ref 0.0–0.2)
Basos: 1 %
EOS (ABSOLUTE): 0.1 10*3/uL (ref 0.0–0.4)
Eos: 2 %
Hematocrit: 43 % (ref 37.5–51.0)
Hemoglobin: 14.8 g/dL (ref 13.0–17.7)
Immature Grans (Abs): 0 10*3/uL (ref 0.0–0.1)
Immature Granulocytes: 0 %
Lymphocytes Absolute: 2 10*3/uL (ref 0.7–3.1)
Lymphs: 28 %
MCH: 29 pg (ref 26.6–33.0)
MCHC: 34.4 g/dL (ref 31.5–35.7)
MCV: 84 fL (ref 79–97)
Monocytes Absolute: 0.7 10*3/uL (ref 0.1–0.9)
Monocytes: 9 %
Neutrophils Absolute: 4.2 10*3/uL (ref 1.4–7.0)
Neutrophils: 60 %
Platelets: 141 10*3/uL — ABNORMAL LOW (ref 150–450)
RBC: 5.11 x10E6/uL (ref 4.14–5.80)
RDW: 12.4 % (ref 11.6–15.4)
WBC: 7 10*3/uL (ref 3.4–10.8)

## 2020-12-07 LAB — TSH: TSH: 1.81 u[IU]/mL (ref 0.450–4.500)

## 2020-12-07 LAB — LIPID PANEL
Chol/HDL Ratio: 4.8 ratio (ref 0.0–5.0)
Cholesterol, Total: 186 mg/dL (ref 100–199)
HDL: 39 mg/dL — ABNORMAL LOW (ref >39)
LDL Chol Calc (NIH): 121 mg/dL — ABNORMAL HIGH (ref 0–99)
Triglycerides: 145 mg/dL (ref 0–149)
VLDL Cholesterol Cal: 26 mg/dL (ref 5–40)

## 2020-12-09 ENCOUNTER — Other Ambulatory Visit: Payer: Self-pay

## 2020-12-09 MED ORDER — ROSUVASTATIN CALCIUM 10 MG PO TABS: 10.0000 mg | ORAL_TABLET | Freq: Every day | ORAL | 1 refills | Status: DC

## 2020-12-12 ENCOUNTER — Encounter: Payer: Self-pay | Admitting: Internal Medicine

## 2020-12-12 ENCOUNTER — Ambulatory Visit (INDEPENDENT_AMBULATORY_CARE_PROVIDER_SITE_OTHER): Payer: BC Managed Care – PPO | Admitting: Internal Medicine

## 2020-12-12 ENCOUNTER — Other Ambulatory Visit: Payer: Self-pay

## 2020-12-12 ENCOUNTER — Ambulatory Visit (INDEPENDENT_AMBULATORY_CARE_PROVIDER_SITE_OTHER): Payer: BC Managed Care – PPO

## 2020-12-12 DIAGNOSIS — R0609 Other forms of dyspnea: Secondary | ICD-10-CM

## 2020-12-12 DIAGNOSIS — R0600 Dyspnea, unspecified: Secondary | ICD-10-CM | POA: Diagnosis not present

## 2020-12-12 DIAGNOSIS — D860 Sarcoidosis of lung: Secondary | ICD-10-CM

## 2020-12-12 DIAGNOSIS — R058 Other specified cough: Secondary | ICD-10-CM

## 2020-12-12 NOTE — Progress Notes (Signed)
Philip Hernandez, male    DOB: 05-11-1956,    MRN: [id number]   Brief patient profile:  26 yowm never smoker initial eval by Philip Hernandez 2011 for cough dx sarcoid by nasal bx  then referred to me on 10/11/11 for refractory cough and seen last 03/23/13 off prednsione x months  with rec to continue elavil and f/u prn.    Aug 09, 2010: ENT repeat biopsys: non-necrotizing granuloma (PCR negative for AFB and fungal stains negative) Sep 02, 2010: Started prednisone 60mg  per day for Dx of SARCOIDOSIS with improvement in cough Early Jan 2012: Reduced prednisone to 20mg  per day PFts 12/04/2010: FVC 4.1L/80%, fev1 2.8L/76%, TLC 79%, DLCO 23.5/91% December 18, 2010: REduce prednisone to 10mg  per day 04/23/2011: No change in PFTs. Suspect cough is due to LPR and not sarcoid -> cut prednisone down to 5mg   07/18/11: PFT normal. Continue prednisone 01/22/2012 Reduce prednsione to 5 mg per day > reduce to 2.5 mg per day 03/06/2012  03/06/12 PFT's wnl > try qod effective 06/17/2012 > d/c 'd prednisone 12/09/12 s flare 03/23/2013 PFT's  wnl off all inhalers DLCO 64  Corrects to 84       History of Present Illness  05/12/2019  Pulmonary/ 1st office eval/Philip Hernandez  New sob/cp Chief Complaint  Patient presents with  . Pulmonary Consult    Self referral- seen in the past for sarcoid. He c/o occ CP x 9 months.   New onset Dec 2019 daily, middle of chest 10 sec - to sev hours ?  Not necessarily proportionate as has it at rest > cards eval with neg CT coronary 01/29/19 Dyspnea:  New since pain, only with hills = [id number] Cough: no real cough but new sense of globus and dysphagia assoc with sob and cp   Sleep: no resp symptoms  or cp  SABA use: none rec Change prilosec to 40 mg Take 30- 60 min before your first and last meals of the day  GERD diet  If swallowing not better then need to see your GI doctor Weight control is simply a matter of calorie balance  If breathing and chest discomfort don't improve >   CPST next step - call to  schedule     12/12/2020  f/u ov/Philip Hernandez re:  Cp/sob I don't do any exertion any more so I don't know if it's still a problem  Chief Complaint  Patient presents with  . Follow-up    SOB  Dyspnea:  Ex = says plays golf but  Rides cart  due leg pain and weakness (neuropathy)  Cough: sense of pnds/globus Sleeping: no resp issues SABA HYW:VPXT  02: none  Covid status:   vax x 3    No obvious day to day or daytime variability or assoc excess/ purulent sputum or mucus plugs or hemoptysis or cp or chest tightness, subjective wheeze or overt sinus or hb symptoms.   Sleeping  without nocturnal  or early am exacerbation  of respiratory  c/o's or need for noct saba. Also denies any obvious fluctuation of symptoms with weather or environmental changes or other aggravating or alleviating factors except as outlined above   No unusual exposure hx or h/o childhood pna/ asthma or knowledge of premature birth.  Current Allergies, Complete Past Medical History, Past Surgical History, Family History, and Social History were reviewed in [employer] record.  ROS  The following are not active complaints unless bolded Hoarseness, sore throat, dysphagia, dental problems, itching, sneezing,  nasal congestion  or discharge of excess mucus or purulent secretions, ear ache,   fever, chills, sweats, unintended wt loss or wt gain, classically pleuritic or exertional cp,  orthopnea pnd or arm/hand swelling  or leg swelling, presyncope, palpitations, abdominal pain, anorexia, nausea, vomiting, diarrhea  or change in bowel habits or change in bladder habits, change in stools or change in urine, dysuria, hematuria,  rash, arthralgias, visual complaints, headache, numbness, weakness or ataxia or problems with walking or coordination,  change in mood or  memory.        Current Meds  Medication Sig  . acetaminophen (TYLENOL) 500 MG tablet Take 500 mg by mouth every 6 (six) hours as needed.  . benzonatate  (TESSALON) 200 MG capsule TAKE ONE (1) CAPSULE THREE (3) TIMES EACH DAY AS NEEDED  . cetirizine (ZYRTEC) 10 MG tablet Take 10 mg by mouth daily.  . cholecalciferol (VITAMIN D) 1000 UNITS tablet Take 1,000 Units by mouth daily.  . fluticasone (CUTIVATE) 0.05 % cream Apply 1 application topically daily.  Marland Kitchen gabapentin (NEURONTIN) 800 MG tablet Take 1 tablet (800 mg total) by mouth 4 (four) times daily.  Marland Kitchen ketoconazole (NIZORAL) 2 % cream Apply 1 application topically as needed.  . Multiple Vitamins-Minerals (AIRBORNE PO) Take by mouth daily.  Marland Kitchen omeprazole (PRILOSEC) 40 MG capsule TAKE ONE CAPSULE BY MOUTH TWICE A DAY  . rosuvastatin (CRESTOR) 10 MG tablet Take 1 tablet (10 mg total) by mouth daily.                 Past Medical History:  Diagnosis Date  . Colon polyp   . Colon polyps 2015  . Cough    chronic sinus issues, GERD with poor dietary control and Pulmonary Sarcoid.   Marland Kitchen GERD (gastroesophageal reflux disease) Jan 2012   Philip Hernandez. Bx proven.  Deve abd pain in Jan 2012 following high dose steroids for sarcoid. s/p EGD and improvement in symptoms following PPI  . Hypogonadism male   . Kidney stone   . Malignant neoplasm of prostate [facility]) dec 2010   Philip Hernandez. s/p surgery dec 2010, hx of rnormal PSA  since then  . Other and unspecified mycoses   . Painful respiration   . Peripheral neuropathy   . Prostate cancer ([place])    reoccurence (getting radiation 01-14-17 start).  . Sarcoidosis dec 2011   stage 2 pulmonary, neck nodes, splenic granuloma  . Skin cancer   . Swelling, mass, or lump in head and neck        Objective:     Wt Readings from Last 3 Encounters:  12/12/20 182 lb 6.4 oz (82.7 kg)  11/25/20 180 lb (81.6 kg)  09/20/20 184 lb 9.6 oz (83.7 kg)      Vital signs reviewed  12/12/2020  - Note at rest 02 sats  98% on RA   General appearance:    Somber wm nad/ feq throat clearing    HEENT : pt wearing mask not removed for exam due to covid -19 concerns.     NECK :  without JVD/Nodes/TM/ nl carotid upstrokes bilaterally   LUNGS: no acc muscle use,  Nl contour chest which is clear to A and P bilaterally without cough on insp or exp maneuvers   CV:  RRR  no s3 or murmur or increase in P2, and no edema   ABD:  soft and nontender with nl inspiratory excursion in the supine position. No bruits or organomegaly appreciated, bowel sounds nl  MS:  Nl gait/ ext warm without deformities, calf tenderness, cyanosis or clubbing No obvious joint restrictions   SKIN: warm and dry without lesions    NEURO:  alert, approp, nl sensorium with  no motor or cerebellar deficits apparent.         CXR PA and Lateral:   12/12/2020 :    I personally reviewed images and agree with radiology impression as follows:    No active cardiopulmonary disease.     Assessment

## 2020-12-12 NOTE — Patient Instructions (Signed)
To get the most out of exercise, you need to be continuously aware that you are short of breath, but never out of breath, for 20 minutes daily. As you improve, it will actually be easier for you to do the same amount of exercise  in  20-30  minutes so always push to the level where you are short of breath.     Prilosec 40 mg Take 30- 60 min before your first and last meals of the day  X 6 weeks   GERD (REFLUX)  is an extremely common cause of respiratory symptoms just like yours , many times with no obvious heartburn at all.    It can be treated with medication, but also with lifestyle changes including elevation of the head of your bed (ideally with 6 -8inch blocks under the headboard of your bed),  Smoking cessation, avoidance of late meals, excessive alcohol, and avoid fatty foods, chocolate, peppermint, colas, red wine, and acidic juices such as orange juice.  NO MINT OR MENTHOL PRODUCTS SO NO COUGH DROPS  USE SUGARLESS CANDY INSTEAD (Jolley ranchers or Stover's or Life Savers) or even ice chips will also do - the key is to swallow to prevent all throat clearing. NO OIL BASED VITAMINS - use powdered substitutes.  Avoid fish oil when coughing.    If not happy, please call to schedule a cpst while on the medication above   Please remember to go to the  x-ray department  for your tests - we will call you with the results when they are available     Pulmonary follow up is as needed

## 2020-12-13 ENCOUNTER — Encounter: Payer: Self-pay | Admitting: Internal Medicine

## 2020-12-13 ENCOUNTER — Ambulatory Visit (INDEPENDENT_AMBULATORY_CARE_PROVIDER_SITE_OTHER): Payer: BC Managed Care – PPO | Admitting: Family Medicine

## 2020-12-13 ENCOUNTER — Encounter: Payer: Self-pay | Admitting: *Deleted

## 2020-12-13 ENCOUNTER — Encounter: Payer: Self-pay | Admitting: Family Medicine

## 2020-12-13 VITALS — BP 138/69 | HR 56 | Temp 97.3°F | Ht 70.0 in | Wt 179.8 lb

## 2020-12-13 DIAGNOSIS — E782 Mixed hyperlipidemia: Secondary | ICD-10-CM

## 2020-12-13 DIAGNOSIS — D8689 Sarcoidosis of other sites: Secondary | ICD-10-CM

## 2020-12-13 DIAGNOSIS — K219 Gastro-esophageal reflux disease without esophagitis: Secondary | ICD-10-CM

## 2020-12-13 DIAGNOSIS — R058 Other specified cough: Secondary | ICD-10-CM | POA: Insufficient documentation

## 2020-12-13 MED ORDER — OMEPRAZOLE 40 MG PO CPDR
DELAYED_RELEASE_CAPSULE | ORAL | 3 refills | Status: DC
Start: 2020-12-13 — End: 2021-12-14

## 2020-12-13 NOTE — Progress Notes (Signed)
BP 138/69   Pulse (!) 56   Temp (!) 97.3 F (36.3 C) (Temporal)   Ht 5' 10" (1.778 m)   Wt 179 lb 12.8 oz (81.6 kg)   BMI 25.80 kg/m    Subjective:   Patient ID: Philip Hernandez, male    DOB: 03/16/1956, 65 y.o.   MRN: [id number]  HPI: Philip Hernandez is a 65 y.o. male presenting on 12/13/2020 for Medical Management of Chronic Issues and Hyperlipidemia (Discuss crestor )   HPI Hyperlipidemia Patient is coming in for recheck of his hyperlipidemia. The patient is currently taking no medication currently would recommend starting Crestor something does have a here with him stable and he has some questions, he decided he will started.. They deny any issues with myalgias or history of liver damage from it. They deny any focal numbness or weakness or chest pain.   GERD Patient is currently on omeprazole.  She denies any major symptoms or abdominal pain or belching or burping. She denies any blood in her stool or lightheadedness or dizziness.  Patient has been doing better on the twice a day not given as much indigestion or heartburn or acid issues.  Neuropathy recheck Patient is coming in for neuropathy recheck.  He has neuropathy in both of his feet with burning and tingling sensation for which he takes gabapentin says it does help but he still has issues.  The root of his causes from the sarcoidosis.  Relevant past medical, surgical, family and social history reviewed and updated as indicated. Interim medical history since our last visit reviewed. Allergies and medications reviewed and updated.  Review of Systems  Constitutional: Negative for chills and fever.  Eyes: Negative for visual disturbance.  Respiratory: Negative for shortness of breath and wheezing.   Cardiovascular: Negative for chest pain and leg swelling.  Musculoskeletal: Negative for back pain and gait problem.  Skin: Negative for rash.  Neurological: Negative for weakness.  All other systems reviewed and are  negative.   Per HPI unless specifically indicated above   Allergies as of 12/13/2020      Reactions   Corn-containing Products       Medication List       Accurate as of December 13, 2020 10:25 AM. If you have any questions, ask your nurse or doctor.        acetaminophen 500 MG tablet Commonly known as: TYLENOL Take 500 mg by mouth every 6 (six) hours as needed.   AIRBORNE PO Take by mouth daily.   benzonatate 200 MG capsule Commonly known as: TESSALON TAKE ONE (1) CAPSULE THREE (3) TIMES EACH DAY AS NEEDED   cetirizine 10 MG tablet Commonly known as: ZYRTEC Take 10 mg by mouth daily.   cholecalciferol 1000 units tablet Commonly known as: VITAMIN D Take 1,000 Units by mouth daily.   fluticasone 0.05 % cream Commonly known as: CUTIVATE Apply 1 application topically daily.   gabapentin 800 MG tablet Commonly known as: NEURONTIN Take 1 tablet (800 mg total) by mouth 4 (four) times daily.   ketoconazole 2 % cream Commonly known as: NIZORAL Apply 1 application topically as needed.   omeprazole 40 MG capsule Commonly known as: PRILOSEC TAKE ONE CAPSULE BY MOUTH TWICE A DAY   rosuvastatin 10 MG tablet Commonly known as: Crestor Take 1 tablet (10 mg total) by mouth daily.        Objective:   BP 138/69   Pulse (!) 56   Temp (!) 97.3 F (36.3 C) (  Temporal)   Ht 5' 10" (1.778 m)   Wt 179 lb 12.8 oz (81.6 kg)   BMI 25.80 kg/m   Wt Readings from Last 3 Encounters:  12/13/20 179 lb 12.8 oz (81.6 kg)  12/12/20 182 lb 6.4 oz (82.7 kg)  11/25/20 180 lb (81.6 kg)    Physical Exam Vitals and nursing note reviewed.  Constitutional:      General: He is not in acute distress.    Appearance: He is well-developed. He is not diaphoretic.  Eyes:     General: No scleral icterus.    Conjunctiva/sclera: Conjunctivae normal.  Neck:     Thyroid: No thyromegaly.  Cardiovascular:     Rate and Rhythm: Normal rate and regular rhythm.     Heart sounds: Normal heart  sounds. No murmur heard.   Pulmonary:     Effort: Pulmonary effort is normal. No respiratory distress.     Breath sounds: Normal breath sounds. No wheezing.  Abdominal:     General: Abdomen is flat. Bowel sounds are normal. There is no distension.     Tenderness: There is no abdominal tenderness. There is no guarding or rebound.  Musculoskeletal:        General: Normal range of motion.     Cervical back: Neck supple.  Lymphadenopathy:     Cervical: No cervical adenopathy.  Skin:    General: Skin is warm and dry.     Findings: No rash.  Neurological:     Mental Status: He is alert and oriented to person, place, and time.     Coordination: Coordination normal.  Psychiatric:        Behavior: Behavior normal.     Results for orders placed or performed in visit on 12/06/20  TSH  Result Value Ref Range   TSH 1.810 0.450 - 4.500 uIU/mL  Lipid panel  Result Value Ref Range   Cholesterol, Total 186 100 - 199 mg/dL   Triglycerides 145 0 - 149 mg/dL   HDL 39 (L) >39 mg/dL   VLDL Cholesterol Cal 26 5 - 40 mg/dL   LDL Chol Calc (NIH) 121 (H) 0 - 99 mg/dL   Chol/HDL Ratio 4.8 0.0 - 5.0 ratio  CMP14+EGFR  Result Value Ref Range   Glucose 87 65 - 99 mg/dL   BUN 15 8 - 27 mg/dL   Creatinine, Ser 1.23 0.76 - 1.27 mg/dL   eGFR 66 >59 mL/min/1.73   BUN/Creatinine Ratio 12 10 - 24   Sodium 142 134 - 144 mmol/L   Potassium 5.4 (H) 3.5 - 5.2 mmol/L   Chloride 105 96 - 106 mmol/L   CO2 23 20 - 29 mmol/L   Calcium 9.7 8.6 - 10.2 mg/dL   Total Protein 6.5 6.0 - 8.5 g/dL   Albumin 4.3 3.8 - 4.8 g/dL   Globulin, Total 2.2 1.5 - 4.5 g/dL   Albumin/Globulin Ratio 2.0 1.2 - 2.2   Bilirubin Total 0.8 0.0 - 1.2 mg/dL   Alkaline Phosphatase 89 44 - 121 IU/L   AST 23 0 - 40 IU/L   ALT 21 0 - 44 IU/L  CBC with Differential/Platelet  Result Value Ref Range   WBC 7.0 3.4 - 10.8 x10E3/uL   RBC 5.11 4.14 - 5.80 x10E6/uL   Hemoglobin 14.8 13.0 - 17.7 g/dL   Hematocrit 43.0 37.5 - 51.0 %   MCV  84 79 - 97 fL   MCH 29.0 26.6 - 33.0 pg   MCHC 34.4 31.5 - 35.7 g/dL  RDW 12.4 11.6 - 15.4 %   Platelets 141 (L) 150 - 450 x10E3/uL   Neutrophils 60 Not Estab. %   Lymphs 28 Not Estab. %   Monocytes 9 Not Estab. %   Eos 2 Not Estab. %   Basos 1 Not Estab. %   Neutrophils Absolute 4.2 1.4 - 7.0 x10E3/uL   Lymphocytes Absolute 2.0 0.7 - 3.1 x10E3/uL   Monocytes Absolute 0.7 0.1 - 0.9 x10E3/uL   EOS (ABSOLUTE) 0.1 0.0 - 0.4 x10E3/uL   Basophils Absolute 0.0 0.0 - 0.2 x10E3/uL   Immature Granulocytes 0 Not Estab. %   Immature Grans (Abs) 0.0 0.0 - 0.1 x10E3/uL    Assessment & Plan:   Problem List Items Addressed This Visit      Digestive   GERD   Relevant Medications   omeprazole (PRILOSEC) 40 MG capsule     Nervous and Auditory   Peripheral neuropathy in sarcoidosis     Other   HLD (hyperlipidemia) - Primary      The 10-year ASCVD risk score Mikey Bussing DC Jr., et al., 2013) is: 13.7%   Values used to calculate the score:     Age: 31 years     Sex: Male     Is Non-Hispanic African American: No     Diabetic: No     Tobacco smoker: No     Systolic Blood Pressure: 354 mmHg     Is BP treated: No     HDL Cholesterol: 39 mg/dL     Total Cholesterol: 186 mg/dL   He will start the Crestor and try to 10 mg.  Continue omeprazole twice a day and continue gabapentin.  Patient seems to be doing well on twice a day acid reflux medicine for now.  Continue gabapentin as well. Follow up plan: Return in about 6 months (around 06/15/2021), or if symptoms worsen or fail to improve, for Hyperlipidemia recheck and GERD.  Counseling provided for all of the vaccine components No orders of the defined types were placed in this encounter.   [name], MD [facility] 12/13/2020, 10:25 AM

## 2020-12-13 NOTE — Assessment & Plan Note (Signed)
Onset 2011 Due to stage 2 pulmonary sarcoid and deconditioning New onset Feb 2011. Class 2-3 (any activity, climbing flight of stairs or walking block) No improvement despite sarcoid dx in dec 2011 and starting sterpoids PFT March 2012: Mild ffev1 reduction fev1 2.8L/76% with ratio 69%. Normal DLCO. TLC 79% April 2012: referred for pulmonary rehab June 2012: somewhat better after starting rehab Aug 2012: rehab not helping. PFT no change since march - mild restriction only. Stop rehab - Cardiac CT neg 01/29/2019  - 05/12/2019   Walked RA  3 laps @  approx 247ft each @ rapid pace  stopped due to end of study, min sob with sats 98% at end  -  12/12/2020   [facility] RA  3 laps @ approx 221ft each @ nl/fast pace  stopped due to end of study,  no sob with sats 98% at end and pulse only 73  Unable to reproduce any sob here but he does continue to have symptoms of uacs  (see separate a/p)  - advised sub max ex and cpst if not making progress p several weeks of reg ex

## 2020-12-13 NOTE — Assessment & Plan Note (Signed)
2005: Travel to [facility] Feb 2011: Presented as chronic cough and dyspnea  Summer/Fall 2011: Dr Janace Hoard cervical node bx - "showed bacteria" Jul 10, 2010: ESR 14, ACE 42, CMV, Ur Histo Age, Blasto, Toxi, EBV - all neg. Quantiferon Gold - Indet. Cocci Antibody - negative Jul 13, 2010: sputum x - Myco mucogenium, 2nd  - Pseudallachria Boydii, 3rd - neg. Deemed contaminant by ID Jul 18, 2010: Pan Ct: Cervical nodes, pulmonary infiltrates, calcified and non calcified mediastinal nodes, splenic granuloma, ? Hepatic granuloma Aug 09, 2010: ENT repeat biopsys: non-necrotizing granuloma (PCR negative for AFB and fungal stains negative) Sep 02, 2010: Started prednisone 28m per day for Dx of SARCOIDOSIS with improvement in cough Early Jan 2012: Reduced prednisone to 271mper day PFts 12/04/2010: FVC 4.1L/80%, fev1 2.8L/76%, TLC 79%, DLCO 23.5/91% December 18, 2010: REduce prednisone to 1044mer day 04/23/2011: No change in PFTs. Suspect cough is due to LPR and not sarcoid -> cut prednisone down to 5mg75m0/31/12: PFT normal. Continue prednisone 01/22/2012 Reduce prednsione to 5 mg per day > reduce to 2.5 mg per day 03/06/2012  03/06/12 PFT's wnl > try qod effective 06/17/2012 > d/c 12/09/12 s flare 03/23/2013 PFT's  wnl off all inhalers DLCO 64  Corrects to 84  A good rule of thumb is that >95% of pts with active sarcoid in any organ will have some plain cxr changes - on the other hand  if there are active pulmonary symptoms the cxr will look much worse than the patient:  No evidence of either scenario here/ strongly doubt active dz > no f/u needed

## 2020-12-13 NOTE — Assessment & Plan Note (Signed)
See EGD 06/05/19   Pos for HH and suspected Barrett's but path neg ([facility])  Upper airway cough syndrome (previously labeled PNDS),  is so named because it's frequently impossible to sort out how much is  CR/sinusitis with freq throat clearing (which can be related to primary GERD)   vs  causing  secondary (" extra esophageal")  GERD from wide swings in gastric pressure that occur with throat clearing, often  promoting self use of mint and menthol lozenges that reduce the lower esophageal sphincter tone and exacerbate the problem further in a cyclical fashion.   These are the same pts (now being labeled as having "irritable larynx syndrome" by some cough centers) who not infrequently have a history of having failed to tolerate ace inhibitors,  dry powder inhalers or biphosphonates or report having atypical/extraesophageal reflux symptoms  (throat clearing/ atypical cp as is the case here) that don't respond to standard doses of PPI  and are easily confused as having aecopd or asthma flares by even experienced allergists/ pulmonologists (myself included).   rec max rx x 6 weeks then return to GI if not better    Each maintenance medication was reviewed in detail including emphasizing most importantly the difference between maintenance and prns and under what circumstances the prns are to be triggered using an action plan format where appropriate.  Total time for H and P, chart review, counseling,  directly observing portions of ambulatory 02 saturation study/ and generating customized AVS unique to this office visit / same day charting = 30 min

## 2020-12-26 DIAGNOSIS — H903 Sensorineural hearing loss, bilateral: Secondary | ICD-10-CM | POA: Diagnosis not present

## 2021-02-21 DIAGNOSIS — H903 Sensorineural hearing loss, bilateral: Secondary | ICD-10-CM | POA: Diagnosis not present

## 2021-03-22 ENCOUNTER — Encounter: Payer: Self-pay | Admitting: Adult Health

## 2021-03-22 ENCOUNTER — Other Ambulatory Visit: Payer: Self-pay

## 2021-03-22 ENCOUNTER — Ambulatory Visit (INDEPENDENT_AMBULATORY_CARE_PROVIDER_SITE_OTHER): Payer: BC Managed Care – PPO | Admitting: Adult Health

## 2021-03-22 VITALS — BP 136/79 | HR 59 | Ht 71.0 in | Wt 181.0 lb

## 2021-03-22 DIAGNOSIS — D8689 Sarcoidosis of other sites: Secondary | ICD-10-CM

## 2021-03-22 NOTE — Patient Instructions (Signed)
Your Plan:  Continue gabapentin 800 mg 4 times daily  Trial use of over-the-counter creams such as Voltaren gel, capsaicin cream, etc for possible relief    Follow-up in 1 year or earlier if needed     Thank you for coming to see [place] at [facility]. I hope we have been able to provide you high quality care today.  You may receive a patient satisfaction survey over the next few weeks. We would appreciate your feedback and comments so that we may continue to improve ourselves and the health of our patients.

## 2021-03-22 NOTE — Progress Notes (Signed)
PATIENT: Philip Hernandez DOB: 1956/01/18   [facility] provider: Dr. Leonie Hernandez REASON FOR VISIT: Neuropathy HISTORY FROM: patient    Chief complaint: Chief Complaint  Patient presents with   Follow-up    RM 2 alone Pt is well, things are about the same. Has more cramps in his feet        HISTORY OF PRESENT ILLNESS:  Today, 03/22/2021, Philip Hernandez returns for 58-month neuropathy follow-up.  Reports neuropathy has been stable without worsening.  Continues gabapentin 800mg  4 times daily tolerating without side effects.  He does continue to experience pain daily which can interfere with daily activity and functioning but does notice worsening if he misses gabapentin dosage.  He tries to stay active as tolerated.  He does have occasional bilateral foot cramping which may last 15 to 30 seconds and then subside -this is been an ongoing symptom and not worsening.  No new concerns at this time.    History provided for reference purposes only Update 09/20/2020 JM: Philip Hernandez returns for 74-month follow-up regarding neuropathy.  Remains on gabapentin 800 mg 4 times daily tolerating well without side effects.  Neuropathy has been stable and gabapentin continues to provide benefit.  He will have occasional difficulty remembering to take all 4 doses and will experience worsening symptoms if he misses a dose.  He has been keeping active with routine activity and exercise in fact neuropathy symptoms can worsen after sitting for prolonged period of time. He will have occasional difficulty holding or picking up objects but relatively able to maintain ADLs and IADLs independently.  He does have occasional balance difficulties but overall stable.  Update 03/15/2020 JM:, Philip Hernandez returns for follow-up for neuropathy with paresthesias.  He has been stable since prior visit 6 months ago with unchanged symptoms of numbness/tingling and paresthesias in bilateral lower extremities.  He continues to experience good days and bad  days.  Continues on gabapentin 800 mg 4 times daily tolerating dosage well. At times, may forget to take day time dosage and will notice worsening in symptoms.  He has not trialed additional dose of gabapentin with worsening symptoms as previously discussed with Philip Hernandez. No further concerns at this time.  Update 09/15/2019 Philip Hernandez : He returns for follow-up after last virtual video visit on 03/02/2019.  He continues to have tingling numbness and paresthesias in his feet which are more or less unchanged.  He has good days and bad days.  Physical activity does increase it.  Patient was started on Topamax previously and I will increase the dose to 50 mg twice daily but he had trouble tolerating this and stated that he could not focus with his eyes and had trouble reading and had to rub his eyes.  After taking it for 4 to 6 weeks he discontinued it.  He did note that increasing the dose of Topamax did help to some degree.  He remains on gabapentin and the current dose of 800 mg 4 times daily.  He has not tried Lyrica or Tegretol yet but does not want to try new medication.  Patient was diagnosed with Covid infection 3 weeks ago and did self quarantine himself and now has started going out last few days.  He is currently on prednisone taper as he also has sarcoidosis.  He does complain of feeling tired and having a minimum cough but is otherwise doing well.  His paresthesias are constant but is able to walk well he will have no problems with  falls or balance issues.  Update via virtual visit 03/02/2019 Philip Hernandez: Philip Hernandez is seen today for virtual video office follow-up visit following last visit with Philip Hernandez, nurse practitioner on 08/25/2018.  He states he is doing about the same.  He still has paresthesias in his feet off and on.  He has good days and bad days.  He remains on Topamax 25 mg twice daily which is tolerating well but he is states if not sure if it is doing anything.  He also remains on  gabapentin 800 mg 4 times daily and he does not miss a dose.  He continues to have gait and balance difficulties but is able to catch himself and has not fallen or hurt himself.  He does not use a cane or walker.  He recently got handicap parking sticker renewed by me.  He has no new neurological complaints.  Update 08/25/2018 MM: Philip Hernandez is a 65 year old male with a history of paresthesias in the feet.  He returns today for follow-up.  He remains on gabapentin 800 mg 4 times a day.  He states that there are times that he will miss doses and his pain worsens.  Patient was also advised to start taking Topamax consistently.  He states that he is taking it more than he was but not necessarily consistently.  He states that he always has burning and tingling in the feet although most the time it is tolerable during the day unless he misses a dose of gabapentin.  He denies any significant changes with his gait or balance.  He does state that he is off balance at times.  Fortunately he has not had any recent falls.  He returns today for evaluation.   HISTORY 02/03/18: Philip Hernandez is a 65 year old male with a history of paresthesias in the feet.  He returns today for follow-up.  He is currently on gabapentin 800 mg 4 times a day.  He reports that this work well most the time.  He states recently he has noticed  more pain in the legs.  He states that he does not take Topamax consistently.  Reports that he went to [place] recently and he did take Topamax daily and he felt that his pain was under better control.  Denies any significant changes with his gait or balance.  Denies any falls.  He returns today for evaluation.   REVIEW OF SYSTEMS: Out of a complete 14 system review of symptoms, the patient complains only of the following symptoms, and all other reviewed systems are negative.  See HPI  ALLERGIES: Allergies  Allergen Reactions   Corn-Containing Products     HOME MEDICATIONS: Outpatient Medications  Prior to Visit  Medication Sig Dispense Refill   acetaminophen (TYLENOL) 500 MG tablet Take 500 mg by mouth every 6 (six) hours as needed.     cetirizine (ZYRTEC) 10 MG tablet Take 10 mg by mouth daily.     cholecalciferol (VITAMIN D) 1000 UNITS tablet Take 1,000 Units by mouth daily.     fluticasone (CUTIVATE) 0.05 % cream Apply 1 application topically daily.     gabapentin (NEURONTIN) 800 MG tablet Take 1 tablet (800 mg total) by mouth 4 (four) times daily. 360 tablet 3   ketoconazole (NIZORAL) 2 % cream Apply 1 application topically as needed.     Multiple Vitamins-Minerals (AIRBORNE PO) Take by mouth daily.     omeprazole (PRILOSEC) 40 MG capsule TAKE ONE CAPSULE BY MOUTH TWICE A DAY 180 capsule  3   rosuvastatin (CRESTOR) 10 MG tablet Take 1 tablet (10 mg total) by mouth daily. 90 tablet 1   No facility-administered medications prior to visit.    PAST MEDICAL HISTORY: Past Medical History:  Diagnosis Date   Allergy    Arthritis    Cataract    Colon polyp    Colon polyps 2015   Cough    chronic sinus issues, GERD with poor dietary control and Pulmonary Sarcoid.    GERD (gastroesophageal reflux disease) Jan 2012   [name]. Bx proven.  Deve abd pain in Jan 2012 following high dose steroids for sarcoid. s/p EGD and improvement in symptoms following PPI   Hypogonadism male    Kidney stone    Malignant neoplasm of prostate [facility]) dec 2010   [name]. s/p surgery dec 2010, hx of rnormal PSA  since then   Other and unspecified mycoses    Painful respiration    Peripheral neuropathy    Prostate cancer ([place])    reoccurence (getting radiation 01-14-17 start).   Sarcoidosis dec 2011   stage 2 pulmonary, neck nodes, splenic granuloma   Skin cancer    Swelling, mass, or lump in head and neck     PAST SURGICAL HISTORY: Past Surgical History:  Procedure Laterality Date   HERNIA REPAIR  13 yrs ago   [place] BIOPSY  05/2010, 07/2010   nasal polyps  1984    NASAL SINUS SURGERY  2013   PROSTATE SURGERY  2010   radiation 2018   SHOULDER SURGERY Right 2009, 2010   x 2   SKIN CANCER EXCISION  11/16/14    FAMILY HISTORY: Family History  Problem Relation Age of Onset   Colon cancer Mother 40   Skin cancer Mother    Cancer Mother        colon   Colon cancer Brother 20   Skin cancer Brother    Cancer Brother        colon   Colon cancer Maternal Aunt        dx in her 93s   Cancer Maternal Aunt        "cancer of male organs" dx in her 10s   Breast cancer Maternal Aunt 66       bilateral, dx again at 30   Colon cancer Maternal Aunt 35   Stomach cancer Maternal Aunt 68   Dementia Father    Stomach cancer Maternal Uncle 47   Skin cancer Paternal Aunt    Prostate cancer Paternal Uncle 68   Cancer Paternal Uncle        prostate   Stomach cancer Paternal Grandmother        dx in her 58s   Stroke Paternal Grandfather    Colon cancer Maternal Aunt 68   Prostate cancer Paternal Uncle    Cancer Paternal Uncle        prostate   Prostate cancer Paternal Uncle    Kidney cancer Paternal Uncle    Lung cancer Paternal Uncle    Cancer Paternal Uncle        prostate   Lung cancer Paternal Uncle    Cancer Paternal Uncle        unknown   Cancer Paternal Uncle        prostate   Cancer Cousin        paternal cousin with cancer NOS   Cancer Cousin  paternal cousin with cancer NOS   Esophageal cancer Neg Hx    Rectal cancer Neg Hx     SOCIAL HISTORY: Social History   Socioeconomic History   Marital status: Married    Spouse name: Not on file   Number of children: 2   Years of education: Not on file   Highest education level: Not on file  Occupational History   Occupation: unemployed  Tobacco Use   Smoking status: Never   Smokeless tobacco: Never  Vaping Use   Vaping Use: Never used  Substance and Sexual Activity   Alcohol use: No   Drug use: No   Sexual activity: Yes  Other Topics Concern   Not on file  Social  History Narrative   Married   2 children   Social Determinants of Health   Financial Resource Strain: Not on file  Food Insecurity: Not on file  Transportation Needs: Not on file  Physical Activity: Not on file  Stress: Not on file  Social Connections: Not on file  Intimate Partner Violence: Not on file      PHYSICAL EXAM  Vitals:   03/22/21 1034  BP: 136/79  Pulse: (!) 59  Weight: 181 lb (82.1 kg)  Height: 5\' 11"  (1.803 m)    Body mass index is 25.24 kg/m.  Generalized: Pleasant middle-aged Caucasian male, in no acute distress   Neurological examination  Mentation: Alert oriented to time, place, history taking. Follows all commands speech and language fluent Cranial nerve II-XII: Pupils were equal round reactive to light. Extraocular movements were full, visual field were full on confrontational test. Facial sensation and strength were normal. Uvula tongue midline. Head turning and shoulder shrug  were normal and symmetric. Motor: The motor testing reveals 5 over 5 strength of all 4 extremities. Good symmetric motor tone is noted throughout.  Sensory: Sensory testing decreased vibratory sensation distal bilateral lower extremities.. No evidence of extinction is noted.  Slight hyperesthesia over toes bilaterally. Decreased sensation bilateral hands distally to pin prick, vibration and temperature.  Subjective numbness BLE stocking distribution Coordination: Cerebellar testing reveals good finger-nose-finger and heel-to-shin bilaterally.  Gait and station: Gait is normal. Tandem gait is slightly unsteady.  Romberg is negative. No drift is seen.  Reflexes: Deep tendon reflexes are symmetric and normal bilaterally.     DIAGNOSTIC DATA (LABS, IMAGING, TESTING) - I reviewed patient records, labs, notes, testing and imaging myself where available.  Lab Results  Component Value Date   WBC 7.0 12/06/2020   HGB 14.8 12/06/2020   HCT 43.0 12/06/2020   MCV 84 12/06/2020   PLT  141 (L) 12/06/2020      Component Value Date/Time   NA 142 12/06/2020 1122   K 5.4 (H) 12/06/2020 1122   CL 105 12/06/2020 1122   CO2 23 12/06/2020 1122   GLUCOSE 87 12/06/2020 1122   GLUCOSE 80 03/26/2013 1220   BUN 15 12/06/2020 1122   CREATININE 1.23 12/06/2020 1122   CREATININE 1.15 03/26/2013 1220   CALCIUM 9.7 12/06/2020 1122   PROT 6.5 12/06/2020 1122   ALBUMIN 4.3 12/06/2020 1122   AST 23 12/06/2020 1122   ALT 21 12/06/2020 1122   ALKPHOS 89 12/06/2020 1122   BILITOT 0.8 12/06/2020 1122   GFRNONAA 71 06/07/2020 1421   GFRNONAA 71 03/26/2013 1220   GFRAA 82 06/07/2020 1421   GFRAA 82 03/26/2013 1220   Lab Results  Component Value Date   CHOL 186 12/06/2020   HDL 39 (L) 12/06/2020  [place] 121 (H) 12/06/2020   TRIG 145 12/06/2020   CHOLHDL 4.8 12/06/2020   No results found for: HGBA1C No results found for: VITAMINB12 Lab Results  Component Value Date   TSH 1.810 12/06/2020      ASSESSMENT AND PLAN 65 y.o. year old male  has a past medical history of Allergy, Arthritis, Cataract, Colon polyp, Colon polyps (2015), Cough, GERD (gastroesophageal reflux disease) (Jan 2012), Hypogonadism male, Kidney stone, Malignant neoplasm of prostate ([place]) (dec 2010), Other and unspecified mycoses, Painful respiration, Peripheral neuropathy, Prostate cancer ([place]), Sarcoidosis (dec 2011), Skin cancer, and Swelling, mass, or lump in head and neck. here with :  1.  Paresthesias in the lower extremities from small fiber neuropathy possibly related to sarcoidosis  -Continue gabapentin 800 mg 4 times daily - refill provided  -discussed possibly adding additional therapeutic medication but he currently declines interest.  Discussed use of topical creams/ointments for possible benefit and to call office if he would like to pursue in the future  -Previously tried: Topamax and amitriptyline    Follow-up in 6 months or call earlier if needed (as requested by patient)   CC:  [facility]  provider: Dr. Leonie Hernandez Dettinger, Fransisca Kaufmann, MD    I spent 35 minutes of face-to-face and non-face-to-face time with patient.  This included previsit chart review, lab review, study review, order entry, electronic health record documentation, patient education and discussion regarding above diagnosis and further information as above and answered all other questions to patient satisfaction   Frann Rider, [facility]  [facility] [address] [place], [place] [address]  Phone [phone] Fax [phone] Note: This document was prepared with digital dictation and possible smart phrase technology. Any transcriptional errors that result from this process are unintentional.

## 2021-03-28 DIAGNOSIS — D229 Melanocytic nevi, unspecified: Secondary | ICD-10-CM | POA: Diagnosis not present

## 2021-03-28 DIAGNOSIS — Z85828 Personal history of other malignant neoplasm of skin: Secondary | ICD-10-CM | POA: Diagnosis not present

## 2021-03-28 DIAGNOSIS — L570 Actinic keratosis: Secondary | ICD-10-CM | POA: Diagnosis not present

## 2021-03-28 DIAGNOSIS — L821 Other seborrheic keratosis: Secondary | ICD-10-CM | POA: Diagnosis not present

## 2021-03-28 DIAGNOSIS — L905 Scar conditions and fibrosis of skin: Secondary | ICD-10-CM | POA: Diagnosis not present

## 2021-03-28 DIAGNOSIS — D485 Neoplasm of uncertain behavior of skin: Secondary | ICD-10-CM | POA: Diagnosis not present

## 2021-03-28 DIAGNOSIS — C4441 Basal cell carcinoma of skin of scalp and neck: Secondary | ICD-10-CM | POA: Diagnosis not present

## 2021-03-28 DIAGNOSIS — L819 Disorder of pigmentation, unspecified: Secondary | ICD-10-CM | POA: Diagnosis not present

## 2021-03-28 DIAGNOSIS — L814 Other melanin hyperpigmentation: Secondary | ICD-10-CM | POA: Diagnosis not present

## 2021-03-28 NOTE — Progress Notes (Signed)
I agree with the above plan 

## 2021-05-10 ENCOUNTER — Other Ambulatory Visit: Payer: Self-pay | Admitting: Family Medicine

## 2021-05-17 DIAGNOSIS — C61 Malignant neoplasm of prostate: Secondary | ICD-10-CM | POA: Diagnosis not present

## 2021-05-24 DIAGNOSIS — C61 Malignant neoplasm of prostate: Secondary | ICD-10-CM | POA: Diagnosis not present

## 2021-05-29 DIAGNOSIS — L570 Actinic keratosis: Secondary | ICD-10-CM | POA: Diagnosis not present

## 2021-05-29 DIAGNOSIS — Z08 Encounter for follow-up examination after completed treatment for malignant neoplasm: Secondary | ICD-10-CM | POA: Diagnosis not present

## 2021-05-29 DIAGNOSIS — Z85828 Personal history of other malignant neoplasm of skin: Secondary | ICD-10-CM | POA: Diagnosis not present

## 2021-06-08 ENCOUNTER — Other Ambulatory Visit: Payer: Self-pay

## 2021-06-08 ENCOUNTER — Telehealth: Payer: Self-pay | Admitting: Family Medicine

## 2021-06-08 DIAGNOSIS — E785 Hyperlipidemia, unspecified: Secondary | ICD-10-CM

## 2021-06-08 DIAGNOSIS — E782 Mixed hyperlipidemia: Secondary | ICD-10-CM

## 2021-06-08 NOTE — Telephone Encounter (Signed)
Future orders have been placed.

## 2021-06-09 ENCOUNTER — Other Ambulatory Visit: Payer: Self-pay

## 2021-06-09 ENCOUNTER — Other Ambulatory Visit: Payer: BC Managed Care – PPO

## 2021-06-09 DIAGNOSIS — E782 Mixed hyperlipidemia: Secondary | ICD-10-CM | POA: Diagnosis not present

## 2021-06-09 DIAGNOSIS — E785 Hyperlipidemia, unspecified: Secondary | ICD-10-CM

## 2021-06-10 LAB — CBC WITH DIFFERENTIAL/PLATELET
Basophils Absolute: 0.1 10*3/uL (ref 0.0–0.2)
Basos: 1 %
EOS (ABSOLUTE): 0.4 10*3/uL (ref 0.0–0.4)
Eos: 6 %
Hematocrit: 42.9 % (ref 37.5–51.0)
Hemoglobin: 14.3 g/dL (ref 13.0–17.7)
Immature Grans (Abs): 0 10*3/uL (ref 0.0–0.1)
Immature Granulocytes: 0 %
Lymphocytes Absolute: 2.1 10*3/uL (ref 0.7–3.1)
Lymphs: 29 %
MCH: 28.6 pg (ref 26.6–33.0)
MCHC: 33.3 g/dL (ref 31.5–35.7)
MCV: 86 fL (ref 79–97)
Monocytes Absolute: 0.6 10*3/uL (ref 0.1–0.9)
Monocytes: 9 %
Neutrophils Absolute: 4 10*3/uL (ref 1.4–7.0)
Neutrophils: 55 %
Platelets: 142 10*3/uL — ABNORMAL LOW (ref 150–450)
RBC: 5 x10E6/uL (ref 4.14–5.80)
RDW: 13.2 % (ref 11.6–15.4)
WBC: 7.2 10*3/uL (ref 3.4–10.8)

## 2021-06-10 LAB — PSA, TOTAL AND FREE
PSA, Free: <0.01 ng/mL
Prostate Specific Ag, Serum: <0.1 ng/mL (ref 0.0–4.0)

## 2021-06-10 LAB — LIPID PANEL
Chol/HDL Ratio: 3.2 ratio (ref 0.0–5.0)
Cholesterol, Total: 115 mg/dL (ref 100–199)
HDL: 36 mg/dL — ABNORMAL LOW (ref >39)
LDL Chol Calc (NIH): 61 mg/dL (ref 0–99)
Triglycerides: 90 mg/dL (ref 0–149)
VLDL Cholesterol Cal: 18 mg/dL (ref 5–40)

## 2021-06-10 LAB — CMP14+EGFR
ALT: 23 IU/L (ref 0–44)
AST: 21 IU/L (ref 0–40)
Albumin/Globulin Ratio: 2.3 — ABNORMAL HIGH (ref 1.2–2.2)
Albumin: 4.3 g/dL (ref 3.8–4.8)
Alkaline Phosphatase: 79 IU/L (ref 44–121)
BUN/Creatinine Ratio: 13 (ref 10–24)
BUN: 16 mg/dL (ref 8–27)
Bilirubin Total: 0.9 mg/dL (ref 0.0–1.2)
CO2: 24 mmol/L (ref 20–29)
Calcium: 9.2 mg/dL (ref 8.6–10.2)
Chloride: 105 mmol/L (ref 96–106)
Creatinine, Ser: 1.23 mg/dL (ref 0.76–1.27)
Globulin, Total: 1.9 g/dL (ref 1.5–4.5)
Glucose: 92 mg/dL (ref 65–99)
Potassium: 5 mmol/L (ref 3.5–5.2)
Sodium: 142 mmol/L (ref 134–144)
Total Protein: 6.2 g/dL (ref 6.0–8.5)
eGFR: 66 mL/min/{1.73_m2} (ref >59)

## 2021-06-10 LAB — TSH: TSH: 2.31 u[IU]/mL (ref 0.450–4.500)

## 2021-06-16 ENCOUNTER — Other Ambulatory Visit: Payer: Self-pay

## 2021-06-16 ENCOUNTER — Ambulatory Visit (INDEPENDENT_AMBULATORY_CARE_PROVIDER_SITE_OTHER): Payer: BC Managed Care – PPO | Admitting: Family Medicine

## 2021-06-16 ENCOUNTER — Encounter: Payer: Self-pay | Admitting: Family Medicine

## 2021-06-16 VITALS — BP 124/72 | HR 67 | Ht 71.0 in | Wt 182.0 lb

## 2021-06-16 DIAGNOSIS — E782 Mixed hyperlipidemia: Secondary | ICD-10-CM | POA: Diagnosis not present

## 2021-06-16 DIAGNOSIS — Z23 Encounter for immunization: Secondary | ICD-10-CM

## 2021-06-16 DIAGNOSIS — K219 Gastro-esophageal reflux disease without esophagitis: Secondary | ICD-10-CM | POA: Diagnosis not present

## 2021-06-16 MED ORDER — ROSUVASTATIN CALCIUM 10 MG PO TABS: 10.0000 mg | ORAL_TABLET | Freq: Every day | ORAL | 3 refills | Status: DC

## 2021-06-16 NOTE — Progress Notes (Signed)
BP 124/72   Pulse 67   Ht 5\' 11"  (1.803 m)   Wt 182 lb (82.6 kg)   SpO2 100%   BMI 25.38 kg/m    Subjective:   Patient ID: Philip Hernandez, male    DOB: 1956/03/02, 65 y.o.   MRN: [id number]  HPI: Philip Hernandez is a 65 y.o. male presenting on 06/16/2021 for Medical Management of Chronic Issues and Hyperlipidemia   HPI Hyperlipidemia Patient is coming in for recheck of his hyperlipidemia. The patient is currently taking Crestor. They deny any issues with myalgias or history of liver damage from it. They deny any focal numbness or weakness or chest pain.   GERD Patient is currently on omeprazole.  She denies any major symptoms or abdominal pain or belching or burping. She denies any blood in her stool or lightheadedness or dizziness.   Relevant past medical, surgical, family and social history reviewed and updated as indicated. Interim medical history since our last visit reviewed. Allergies and medications reviewed and updated.  Review of Systems  Constitutional:  Negative for chills and fever.  HENT:  Negative for ear pain and tinnitus.   Eyes:  Negative for pain.  Respiratory:  Negative for cough, shortness of breath and wheezing.   Cardiovascular:  Negative for chest pain, palpitations and leg swelling.  Gastrointestinal:  Negative for abdominal pain, blood in stool, constipation and diarrhea.  Genitourinary:  Negative for dysuria and hematuria.  Musculoskeletal:  Negative for back pain, gait problem and myalgias.  Skin:  Negative for rash.  Neurological:  Negative for dizziness, weakness and headaches.  Psychiatric/Behavioral:  Negative for suicidal ideas.   All other systems reviewed and are negative.  Per HPI unless specifically indicated above   Allergies as of 06/16/2021       Reactions   Corn-containing Products         Medication List        Accurate as of June 16, 2021 11:10 AM. If you have any questions, ask your nurse or doctor.           acetaminophen 500 MG tablet Commonly known as: TYLENOL Take 500 mg by mouth every 6 (six) hours as needed.   AIRBORNE PO Take by mouth daily.   cetirizine 10 MG tablet Commonly known as: ZYRTEC Take 10 mg by mouth daily.   cholecalciferol 1000 units tablet Commonly known as: VITAMIN D Take 1,000 Units by mouth daily.   fluticasone 0.05 % cream Commonly known as: CUTIVATE Apply 1 application topically daily.   gabapentin 800 MG tablet Commonly known as: NEURONTIN Take 1 tablet (800 mg total) by mouth 4 (four) times daily.   ketoconazole 2 % cream Commonly known as: NIZORAL Apply 1 application topically as needed.   omeprazole 40 MG capsule Commonly known as: PRILOSEC TAKE ONE CAPSULE BY MOUTH TWICE A DAY   rosuvastatin 10 MG tablet Commonly known as: CRESTOR Take 1 tablet (10 mg total) by mouth daily. What changed: how much to take Changed by: June 18, 2021 Bayle Calvo, MD         Objective:   BP 124/72   Pulse 67   Ht 5\' 11"  (1.803 m)   Wt 182 lb (82.6 kg)   SpO2 100%   BMI 25.38 kg/m   Wt Readings from Last 3 Encounters:  06/16/21 182 lb (82.6 kg)  03/22/21 181 lb (82.1 kg)  12/13/20 179 lb 12.8 oz (81.6 kg)    Physical Exam Vitals and nursing note reviewed.  Constitutional:  General: He is not in acute distress.    Appearance: He is well-developed. He is not diaphoretic.  Eyes:     General: No scleral icterus.    Conjunctiva/sclera: Conjunctivae normal.  Neck:     Thyroid: No thyromegaly.  Cardiovascular:     Rate and Rhythm: Normal rate and regular rhythm.     Heart sounds: Normal heart sounds. No murmur heard. Pulmonary:     Effort: Pulmonary effort is normal. No respiratory distress.     Breath sounds: Normal breath sounds. No wheezing.  Musculoskeletal:        General: Normal range of motion.     Cervical back: Neck supple.  Lymphadenopathy:     Cervical: No cervical adenopathy.  Skin:    General: Skin is warm and dry.      Findings: No rash.  Neurological:     Mental Status: He is alert and oriented to person, place, and time.     Coordination: Coordination normal.  Psychiatric:        Behavior: Behavior normal.    Results for orders placed or performed in visit on 06/09/21  PSA, total and free  Result Value Ref Range   Prostate Specific Ag, Serum <0.1 0.0 - 4.0 ng/mL   PSA, Free <0.01 N/A ng/mL   PSA, Free Pct CANCELED %  TSH  Result Value Ref Range   TSH 2.310 0.450 - 4.500 uIU/mL  Lipid panel  Result Value Ref Range   Cholesterol, Total 115 100 - 199 mg/dL   Triglycerides 90 0 - 149 mg/dL   HDL 36 (L) >39 mg/dL   VLDL Cholesterol Cal 18 5 - 40 mg/dL   LDL Chol Calc (NIH) 61 0 - 99 mg/dL   Chol/HDL Ratio 3.2 0.0 - 5.0 ratio  CMP14+EGFR  Result Value Ref Range   Glucose 92 65 - 99 mg/dL   BUN 16 8 - 27 mg/dL   Creatinine, Ser 1.23 0.76 - 1.27 mg/dL   eGFR 66 >59 mL/min/1.73   BUN/Creatinine Ratio 13 10 - 24   Sodium 142 134 - 144 mmol/L   Potassium 5.0 3.5 - 5.2 mmol/L   Chloride 105 96 - 106 mmol/L   CO2 24 20 - 29 mmol/L   Calcium 9.2 8.6 - 10.2 mg/dL   Total Protein 6.2 6.0 - 8.5 g/dL   Albumin 4.3 3.8 - 4.8 g/dL   Globulin, Total 1.9 1.5 - 4.5 g/dL   Albumin/Globulin Ratio 2.3 (H) 1.2 - 2.2   Bilirubin Total 0.9 0.0 - 1.2 mg/dL   Alkaline Phosphatase 79 44 - 121 IU/L   AST 21 0 - 40 IU/L   ALT 23 0 - 44 IU/L  CBC with Differential/Platelet  Result Value Ref Range   WBC 7.2 3.4 - 10.8 x10E3/uL   RBC 5.00 4.14 - 5.80 x10E6/uL   Hemoglobin 14.3 13.0 - 17.7 g/dL   Hematocrit 42.9 37.5 - 51.0 %   MCV 86 79 - 97 fL   MCH 28.6 26.6 - 33.0 pg   MCHC 33.3 31.5 - 35.7 g/dL   RDW 13.2 11.6 - 15.4 %   Platelets 142 (L) 150 - 450 x10E3/uL   Neutrophils 55 Not Estab. %   Lymphs 29 Not Estab. %   Monocytes 9 Not Estab. %   Eos 6 Not Estab. %   Basos 1 Not Estab. %   Neutrophils Absolute 4.0 1.4 - 7.0 x10E3/uL   Lymphocytes Absolute 2.1 0.7 - 3.1 x10E3/uL   Monocytes Absolute  0.6 0.1  - 0.9 x10E3/uL   EOS (ABSOLUTE) 0.4 0.0 - 0.4 x10E3/uL   Basophils Absolute 0.1 0.0 - 0.2 x10E3/uL   Immature Granulocytes 0 Not Estab. %   Immature Grans (Abs) 0.0 0.0 - 0.1 x10E3/uL    Assessment & Plan:   Problem List Items Addressed This Visit       Digestive   GERD - Primary     Other   HLD (hyperlipidemia)   Relevant Medications   rosuvastatin (CRESTOR) 10 MG tablet    Continue current medication.  No changes blood work looks good Follow up plan: Return in about 6 months (around 12/14/2021), or if symptoms worsen or fail to improve, for Physical exam.  Counseling provided for all of the vaccine components No orders of the defined types were placed in this encounter.   [name], MD [facility] 06/16/2021, 11:10 AM

## 2021-07-07 ENCOUNTER — Ambulatory Visit (INDEPENDENT_AMBULATORY_CARE_PROVIDER_SITE_OTHER): Payer: BC Managed Care – PPO | Admitting: Family Medicine

## 2021-07-07 ENCOUNTER — Other Ambulatory Visit: Payer: Self-pay

## 2021-07-07 ENCOUNTER — Encounter: Payer: Self-pay | Admitting: Family Medicine

## 2021-07-07 VITALS — BP 111/70 | HR 82 | Temp 98.6°F | Ht 71.0 in | Wt 181.2 lb

## 2021-07-07 DIAGNOSIS — R1031 Right lower quadrant pain: Secondary | ICD-10-CM

## 2021-07-07 NOTE — Progress Notes (Signed)
Acute Office Visit  Subjective:    Patient ID: Philip Hernandez, male    DOB: [birth year], 65 y.o.   MRN: [id number]  Chief Complaint  Patient presents with   Abdominal Pain    HPI Patient is in today for right lower abdominal pain x 5 days. The pain has been intermittent. It is sharp. The pain is worse in the morning and improves throughout the day. Today he has not had any pain really. He denies fever, chills, nausea, vomiting, diarrhea, or urinary symptoms. His last BM was this morning. He does have to strain a little with BMs and his last BMs have been smaller and thinner stools. He had a hernia on the left side that had to be repaired with mesh and he is worried that he now has a hernia on the right side. His symptoms have improved enough that he almost cancelled his appointment today.   Past Medical History:  Diagnosis Date   Allergy    Arthritis    Cataract    Colon polyp    Colon polyps 2015   Cough    chronic sinus issues, GERD with poor dietary control and Pulmonary Sarcoid.    GERD (gastroesophageal reflux disease) Jan 2012   [name]. Bx proven.  Deve abd pain in Jan 2012 following high dose steroids for sarcoid. s/p EGD and improvement in symptoms following PPI   Hypogonadism male    Kidney stone    Malignant neoplasm of prostate [facility]) dec 2010   [name]. s/p surgery dec 2010, hx of rnormal PSA  since then   Other and unspecified mycoses    Painful respiration    Peripheral neuropathy    Prostate cancer ([place])    reoccurence (getting radiation 01-14-17 start).   Sarcoidosis dec 2011   stage 2 pulmonary, neck nodes, splenic granuloma   Skin cancer    Swelling, mass, or lump in head and neck     Past Surgical History:  Procedure Laterality Date   HERNIA REPAIR  13 yrs ago   [place] BIOPSY  05/2010, 07/2010   nasal polyps  1984   NASAL SINUS SURGERY  2013   PROSTATE SURGERY  2010   radiation 2018   SHOULDER SURGERY Right 2009, 2010    x 2   SKIN CANCER EXCISION  11/16/14    Family History  Problem Relation Age of Onset   Colon cancer Mother 67   Skin cancer Mother    Cancer Mother        colon   Colon cancer Brother 22   Skin cancer Brother    Cancer Brother        colon   Colon cancer Maternal Aunt        dx in her 20s   Cancer Maternal Aunt        "cancer of male organs" dx in her 47s   Breast cancer Maternal Aunt 66       bilateral, dx again at 26   Colon cancer Maternal Aunt 35   Stomach cancer Maternal Aunt 68   Dementia Father    Stomach cancer Maternal Uncle 47   Skin cancer Paternal Aunt    Prostate cancer Paternal Uncle 30   Cancer Paternal Uncle        prostate   Stomach cancer Paternal Grandmother        dx in her 107s   Stroke Paternal Grandfather  Colon cancer Maternal Aunt 68   Prostate cancer Paternal Uncle    Cancer Paternal Uncle        prostate   Prostate cancer Paternal Uncle    Kidney cancer Paternal Uncle    Lung cancer Paternal Uncle    Cancer Paternal Uncle        prostate   Lung cancer Paternal Uncle    Cancer Paternal Uncle        unknown   Cancer Paternal Uncle        prostate   Cancer Cousin        paternal cousin with cancer NOS   Cancer Cousin        paternal cousin with cancer NOS   Esophageal cancer Neg Hx    Rectal cancer Neg Hx     Social History   Socioeconomic History   Marital status: Married    Spouse name: Not on file   Number of children: 2   Years of education: Not on file   Highest education level: Not on file  Occupational History   Occupation: unemployed  Tobacco Use   Smoking status: Never   Smokeless tobacco: Never  Vaping Use   Vaping Use: Never used  Substance and Sexual Activity   Alcohol use: No   Drug use: No   Sexual activity: Yes  Other Topics Concern   Not on file  Social History Narrative   Married   2 children   Social Determinants of Health   Financial Resource Strain: Not on file  Food Insecurity: Not on  file  Transportation Needs: Not on file  Physical Activity: Not on file  Stress: Not on file  Social Connections: Not on file  Intimate Partner Violence: Not on file    Outpatient Medications Prior to Visit  Medication Sig Dispense Refill   acetaminophen (TYLENOL) 500 MG tablet Take 500 mg by mouth every 6 (six) hours as needed.     cetirizine (ZYRTEC) 10 MG tablet Take 10 mg by mouth daily.     cholecalciferol (VITAMIN D) 1000 UNITS tablet Take 1,000 Units by mouth daily.     gabapentin (NEURONTIN) 800 MG tablet Take 1 tablet (800 mg total) by mouth 4 (four) times daily. 360 tablet 3   Multiple Vitamins-Minerals (AIRBORNE PO) Take by mouth daily.     omeprazole (PRILOSEC) 40 MG capsule TAKE ONE CAPSULE BY MOUTH TWICE A DAY 180 capsule 3   rosuvastatin (CRESTOR) 10 MG tablet Take 1 tablet (10 mg total) by mouth daily. 90 tablet 3   fluticasone (CUTIVATE) 0.05 % cream Apply 1 application topically daily.     ketoconazole (NIZORAL) 2 % cream Apply 1 application topically as needed.     No facility-administered medications prior to visit.    Allergies  Allergen Reactions   Corn-Containing Products     Review of Systems As per HPI.    Objective:    Physical Exam Vitals and nursing note reviewed.  Constitutional:      General: He is not in acute distress.    Appearance: He is not ill-appearing, toxic-appearing or diaphoretic.  Pulmonary:     Effort: Pulmonary effort is normal. No respiratory distress.  Abdominal:     General: Bowel sounds are normal. There is no distension.     Palpations: Abdomen is soft. There is no shifting dullness or mass.     Tenderness: There is abdominal tenderness (mild tenderness, to medial aspect of lower RLQ) in the right lower quadrant.  There is no guarding. Negative signs include Murphy's sign, Rovsing's sign and McBurney's sign.     Hernia: No hernia is present.  Skin:    General: Skin is warm and dry.  Neurological:     General: No focal  deficit present.     Mental Status: He is alert and oriented to person, place, and time.    BP 111/70   Pulse 82   Temp 98.6 F (37 C) (Temporal)   Ht $R'5\' 11"'Ch$  (1.803 m)   Wt 181 lb 4 oz (82.2 kg)   BMI 25.28 kg/m  Wt Readings from Last 3 Encounters:  07/07/21 181 lb 4 oz (82.2 kg)  06/16/21 182 lb (82.6 kg)  03/22/21 181 lb (82.1 kg)    Health Maintenance Due  Topic Date Due   Pneumococcal Vaccine 78-90 Years old (1 - PCV) Never done   Zoster Vaccines- Shingrix (1 of 2) Never done   COVID-19 Vaccine (4 - Booster) 12/05/2020    There are no preventive care reminders to display for this patient.   Lab Results  Component Value Date   TSH 2.310 06/09/2021   Lab Results  Component Value Date   WBC 7.2 06/09/2021   HGB 14.3 06/09/2021   HCT 42.9 06/09/2021   MCV 86 06/09/2021   PLT 142 (L) 06/09/2021   Lab Results  Component Value Date   NA 142 06/09/2021   K 5.0 06/09/2021   CO2 24 06/09/2021   GLUCOSE 92 06/09/2021   BUN 16 06/09/2021   CREATININE 1.23 06/09/2021   BILITOT 0.9 06/09/2021   ALKPHOS 79 06/09/2021   AST 21 06/09/2021   ALT 23 06/09/2021   PROT 6.2 06/09/2021   ALBUMIN 4.3 06/09/2021   CALCIUM 9.2 06/09/2021   EGFR 66 06/09/2021   GFR 73.98 12/10/2011   Lab Results  Component Value Date   CHOL 115 06/09/2021   Lab Results  Component Value Date   HDL 36 (L) 06/09/2021   Lab Results  Component Value Date   LDLCALC 61 06/09/2021   Lab Results  Component Value Date   TRIG 90 06/09/2021   Lab Results  Component Value Date   CHOLHDL 3.2 06/09/2021   No results found for: HGBA1C     Assessment & Plan:   Rodel was seen today for abdominal pain.  Diagnoses and all orders for this visit:  Right lower quadrant pain No red flags. No hernia appreciated today. Discussed stool softener for constipation. Return to office for new or worsening symptoms, or if symptoms persist.   The patient indicates understanding of these issues and  agrees with the plan.  Gwenlyn Perking, FNP

## 2021-07-07 NOTE — Patient Instructions (Signed)

## 2021-09-08 ENCOUNTER — Other Ambulatory Visit: Payer: Self-pay | Admitting: Adult Health

## 2021-10-03 ENCOUNTER — Ambulatory Visit (INDEPENDENT_AMBULATORY_CARE_PROVIDER_SITE_OTHER): Payer: BC Managed Care – PPO | Admitting: Adult Health

## 2021-10-03 ENCOUNTER — Encounter: Payer: Self-pay | Admitting: Adult Health

## 2021-10-03 VITALS — BP 144/71 | HR 61 | Ht 70.0 in | Wt 186.0 lb

## 2021-10-03 DIAGNOSIS — D8689 Sarcoidosis of other sites: Secondary | ICD-10-CM

## 2021-10-03 NOTE — Progress Notes (Signed)
PATIENT: Philip Hernandez DOB: Jan 12, 1956   [facility] provider: Dr. Leonie Man REASON FOR VISIT: Neuropathy HISTORY FROM: patient    Chief complaint: Chief Complaint  Patient presents with   Follow-up    RM 3 alone Pt is well, states his feet hurt more at night now and cramps.         HISTORY OF PRESENT ILLNESS:  Update 10/03/2021 JM: Returns for 42-month neuropathy follow-up.  Remains on gabapentin 800 mg 4x daily. Does report consistent pain during the day which has been stable but now worse at night with increased pain and cramping. Also reports increased LUE cramping (from elbow to finger) and continued occasional b/l hand cramping.  No further concerns at this time.    History provided for reference purposes only Update 03/22/2021 JM: [name] returns for 6-month neuropathy follow-up.  Reports neuropathy has been stable without worsening.  Continues gabapentin 800mg  4 times daily tolerating without side effects.  He does continue to experience pain daily which can interfere with daily activity and functioning but does notice worsening if he misses gabapentin dosage.  He tries to stay active as tolerated.  He does have occasional bilateral foot cramping which may last 15 to 30 seconds and then subside -this is been an ongoing symptom and not worsening.  No new concerns at this time.  Update 09/20/2020 JM: [name] returns for 75-month follow-up regarding neuropathy.  Remains on gabapentin 800 mg 4 times daily tolerating well without side effects.  Neuropathy has been stable and gabapentin continues to provide benefit.  He will have occasional difficulty remembering to take all 4 doses and will experience worsening symptoms if he misses a dose.  He has been keeping active with routine activity and exercise in fact neuropathy symptoms can worsen after sitting for prolonged period of time. He will have occasional difficulty holding or picking up objects but relatively able to maintain ADLs and IADLs  independently.  He does have occasional balance difficulties but overall stable.  Update 03/15/2020 JM:, [name] returns for follow-up for neuropathy with paresthesias.  He has been stable since prior visit 6 months ago with unchanged symptoms of numbness/tingling and paresthesias in bilateral lower extremities.  He continues to experience good days and bad days.  Continues on gabapentin 800 mg 4 times daily tolerating dosage well. At times, may forget to take day time dosage and will notice worsening in symptoms.  He has not trialed additional dose of gabapentin with worsening symptoms as previously discussed with Dr. Leonie Man. No further concerns at this time.  Update 09/15/2019 Dr. Leonie Man : He returns for follow-up after last virtual video visit on 03/02/2019.  He continues to have tingling numbness and paresthesias in his feet which are more or less unchanged.  He has good days and bad days.  Physical activity does increase it.  Patient was started on Topamax previously and I will increase the dose to 50 mg twice daily but he had trouble tolerating this and stated that he could not focus with his eyes and had trouble reading and had to rub his eyes.  After taking it for 4 to 6 weeks he discontinued it.  He did note that increasing the dose of Topamax did help to some degree.  He remains on gabapentin and the current dose of 800 mg 4 times daily.  He has not tried Lyrica or Tegretol yet but does not want to try new medication.  Patient was diagnosed with Covid infection 3 weeks  ago and did self quarantine himself and now has started going out last few days.  He is currently on prednisone taper as he also has sarcoidosis.  He does complain of feeling tired and having a minimum cough but is otherwise doing well.  His paresthesias are constant but is able to walk well he will have no problems with falls or balance issues.  Update via virtual visit 03/02/2019 Dr. Leonie Man: [name] is seen today for virtual video  office follow-up visit following last visit with [name], nurse practitioner on 08/25/2018.  He states he is doing about the same.  He still has paresthesias in his feet off and on.  He has good days and bad days.  He remains on Topamax 25 mg twice daily which is tolerating well but he is states if not sure if it is doing anything.  He also remains on gabapentin 800 mg 4 times daily and he does not miss a dose.  He continues to have gait and balance difficulties but is able to catch himself and has not fallen or hurt himself.  He does not use a cane or walker.  He recently got handicap parking sticker renewed by me.  He has no new neurological complaints.  Update 08/25/2018 MM: [name] is a 66 year old male with a history of paresthesias in the feet.  He returns today for follow-up.  He remains on gabapentin 800 mg 4 times a day.  He states that there are times that he will miss doses and his pain worsens.  Patient was also advised to start taking Topamax consistently.  He states that he is taking it more than he was but not necessarily consistently.  He states that he always has burning and tingling in the feet although most the time it is tolerable during the day unless he misses a dose of gabapentin.  He denies any significant changes with his gait or balance.  He does state that he is off balance at times.  Fortunately he has not had any recent falls.  He returns today for evaluation.   HISTORY 02/03/18: [name] is a 66 year old male with a history of paresthesias in the feet.  He returns today for follow-up.  He is currently on gabapentin 800 mg 4 times a day.  He reports that this work well most the time.  He states recently he has noticed  more pain in the legs.  He states that he does not take Topamax consistently.  Reports that he went to [place] recently and he did take Topamax daily and he felt that his pain was under better control.  Denies any significant changes with his gait or balance.   Denies any falls.  He returns today for evaluation.   REVIEW OF SYSTEMS: Out of a complete 14 system review of symptoms, the patient complains only of the following symptoms, and all other reviewed systems are negative.  See HPI  ALLERGIES: Allergies  Allergen Reactions   Corn-Containing Products     HOME MEDICATIONS: Outpatient Medications Prior to Visit  Medication Sig Dispense Refill   acetaminophen (TYLENOL) 500 MG tablet Take 500 mg by mouth every 6 (six) hours as needed.     cetirizine (ZYRTEC) 10 MG tablet Take 10 mg by mouth daily.     cholecalciferol (VITAMIN D) 1000 UNITS tablet Take 1,000 Units by mouth daily.     gabapentin (NEURONTIN) 800 MG tablet TAKE 1 TABLET BY MOUTH 4  TIMES DAILY 360 tablet 3  Multiple Vitamins-Minerals (AIRBORNE PO) Take by mouth daily.     omeprazole (PRILOSEC) 40 MG capsule TAKE ONE CAPSULE BY MOUTH TWICE A DAY 180 capsule 3   rosuvastatin (CRESTOR) 10 MG tablet Take 1 tablet (10 mg total) by mouth daily. 90 tablet 3   No facility-administered medications prior to visit.    PAST MEDICAL HISTORY: Past Medical History:  Diagnosis Date   Allergy    Arthritis    Cataract    Colon polyp    Colon polyps 2015   Cough    chronic sinus issues, GERD with poor dietary control and Pulmonary Sarcoid.    GERD (gastroesophageal reflux disease) Jan 2012   [name]. Bx proven.  Deve abd pain in Jan 2012 following high dose steroids for sarcoid. s/p EGD and improvement in symptoms following PPI   Hypogonadism male    Kidney stone    Malignant neoplasm of prostate [facility]) dec 2010   [name]. s/p surgery dec 2010, hx of rnormal PSA  since then   Other and unspecified mycoses    Painful respiration    Peripheral neuropathy    Prostate cancer ([place])    reoccurence (getting radiation 01-14-17 start).   Sarcoidosis dec 2011   stage 2 pulmonary, neck nodes, splenic granuloma   Skin cancer    Swelling, mass, or lump in head and neck     PAST  SURGICAL HISTORY: Past Surgical History:  Procedure Laterality Date   HERNIA REPAIR  13 yrs ago   [place] BIOPSY  05/2010, 07/2010   nasal polyps  1984   NASAL SINUS SURGERY  2013   PROSTATE SURGERY  2010   radiation 2018   SHOULDER SURGERY Right 2009, 2010   x 2   SKIN CANCER EXCISION  11/16/14    FAMILY HISTORY: Family History  Problem Relation Age of Onset   Colon cancer Mother 66   Skin cancer Mother    Cancer Mother        colon   Colon cancer Brother 9   Skin cancer Brother    Cancer Brother        colon   Colon cancer Maternal Aunt        dx in her 64s   Cancer Maternal Aunt        "cancer of male organs" dx in her 52s   Breast cancer Maternal Aunt 66       bilateral, dx again at 68   Colon cancer Maternal Aunt 35   Stomach cancer Maternal Aunt 68   Dementia Father    Stomach cancer Maternal Uncle 47   Skin cancer Paternal Aunt    Prostate cancer Paternal Uncle 39   Cancer Paternal Uncle        prostate   Stomach cancer Paternal Grandmother        dx in her 31s   Stroke Paternal Grandfather    Colon cancer Maternal Aunt 68   Prostate cancer Paternal Uncle    Cancer Paternal Uncle        prostate   Prostate cancer Paternal Uncle    Kidney cancer Paternal Uncle    Lung cancer Paternal Uncle    Cancer Paternal Uncle        prostate   Lung cancer Paternal Uncle    Cancer Paternal Uncle        unknown   Cancer Paternal Uncle        prostate  Cancer Cousin        paternal cousin with cancer NOS   Cancer Cousin        paternal cousin with cancer NOS   Esophageal cancer Neg Hx    Rectal cancer Neg Hx     SOCIAL HISTORY: Social History   Socioeconomic History   Marital status: Married    Spouse name: Not on file   Number of children: 2   Years of education: Not on file   Highest education level: Not on file  Occupational History   Occupation: unemployed  Tobacco Use   Smoking status: Never   Smokeless tobacco: Never   Vaping Use   Vaping Use: Never used  Substance and Sexual Activity   Alcohol use: No   Drug use: No   Sexual activity: Yes  Other Topics Concern   Not on file  Social History Narrative   Married   2 children   Social Determinants of Health   Financial Resource Strain: Not on file  Food Insecurity: Not on file  Transportation Needs: Not on file  Physical Activity: Not on file  Stress: Not on file  Social Connections: Not on file  Intimate Partner Violence: Not on file      PHYSICAL EXAM  Vitals:   10/03/21 1039  BP: (!) 144/71  Pulse: 61  Weight: 186 lb (84.4 kg)  Height: 5\' 10"  (1.778 m)     Body mass index is 26.69 kg/m.  Generalized: Pleasant middle-aged Caucasian male, in no acute distress   Neurological examination  Mentation: Alert oriented to time, place, history taking. Follows all commands speech and language fluent Cranial nerve II-XII: Pupils were equal round reactive to light. Extraocular movements were full, visual field were full on confrontational test. Facial sensation and strength were normal. Uvula tongue midline. Head turning and shoulder shrug  were normal and symmetric. Motor: The motor testing reveals 5 over 5 strength of all 4 extremities. Good symmetric motor tone is noted throughout.  Sensory: Sensory testing decreased vibratory sensation distal bilateral lower extremities.. No evidence of extinction is noted.  Slight hyperesthesia over toes bilaterally. Decreased sensation bilateral hands distally to pin prick, vibration and temperature.  Subjective numbness BLE stocking distribution Coordination: Cerebellar testing reveals good finger-nose-finger and heel-to-shin bilaterally.  Gait and station: Gait is normal. Tandem gait is slightly unsteady.  Romberg is negative. No drift is seen.  Reflexes: Deep tendon reflexes are symmetric and normal bilaterally.     DIAGNOSTIC DATA (LABS, IMAGING, TESTING) - I reviewed patient records, labs,  notes, testing and imaging myself where available.  Lab Results  Component Value Date   WBC 7.2 06/09/2021   HGB 14.3 06/09/2021   HCT 42.9 06/09/2021   MCV 86 06/09/2021   PLT 142 (L) 06/09/2021      Component Value Date/Time   NA 142 06/09/2021 1208   K 5.0 06/09/2021 1208   CL 105 06/09/2021 1208   CO2 24 06/09/2021 1208   GLUCOSE 92 06/09/2021 1208   GLUCOSE 80 03/26/2013 1220   BUN 16 06/09/2021 1208   CREATININE 1.23 06/09/2021 1208   CREATININE 1.15 03/26/2013 1220   CALCIUM 9.2 06/09/2021 1208   PROT 6.2 06/09/2021 1208   ALBUMIN 4.3 06/09/2021 1208   AST 21 06/09/2021 1208   ALT 23 06/09/2021 1208   ALKPHOS 79 06/09/2021 1208   BILITOT 0.9 06/09/2021 1208   GFRNONAA 71 06/07/2020 1421   GFRNONAA 71 03/26/2013 1220   GFRAA 82 06/07/2020 1421  GFRAA 82 03/26/2013 1220   Lab Results  Component Value Date   CHOL 115 06/09/2021   HDL 36 (L) 06/09/2021   LDLCALC 61 06/09/2021   TRIG 90 06/09/2021   CHOLHDL 3.2 06/09/2021   No results found for: HGBA1C No results found for: VITAMINB12 Lab Results  Component Value Date   TSH 2.310 06/09/2021    01/08/13 NCV/EMG Nerve conduction studies done on both lower extremities and the left upper extremity shows evidence of primarily motor involvement of the nerves in the legs, normal on the left arm. EMG evaluation of the left lower extremity shows mild primarily distal acute and chronic denervation, consistent with a peripheral neuropathy. There is no evidence of nerve root involvement. EMG evaluation shows evidence of a mononeuropathy affecting the median nerve proximally, again without evidence of a cervical radiculopathy. In the appropriate clinical setting, the study could be consistent with Guillian-Barre syndrome, but some chronic features of denervation also seen. Sarcoidosis may be another consideration, but no sensory involvement is noted.      ASSESSMENT AND PLAN 66 y.o. year old male  has a past medical  history of Allergy, Arthritis, Cataract, Colon polyp, Colon polyps (2015), Cough, GERD (gastroesophageal reflux disease) (Jan 2012), Hypogonadism male, Kidney stone, Malignant neoplasm of prostate ([place]) (dec 2010), Other and unspecified mycoses, Painful respiration, Peripheral neuropathy, Prostate cancer ([place]), Sarcoidosis (dec 2011), Skin cancer, and Swelling, mass, or lump in head and neck. here with :   1.  Paresthesias in the lower extremities from small fiber neuropathy possibly related to sarcoidosis  -Continue gabapentin 800 mg 4 times daily - refill provided  -will discuss other treatment options with Dr. Leonie Man as symptoms seem progressive   -consider repeat EMG/NCV - prior testing in 2014 - unsure if warranted - will f/u with Dr. Leonie Man  -Previously tried: Topamax and amitriptyline    Follow-up in 6 months with Dr. Leonie Man or call earlier if needed   CC:  Dettinger, Fransisca Kaufmann, MD    I spent 38 minutes of face-to-face and non-face-to-face time with patient.  This included previsit chart review, lab review, study review, order entry, electronic health record documentation, patient education and discussion regarding above diagnosis with progression and further information as above and answered all other questions to patient satisfaction   Frann Rider, [facility]  [facility] [address] [place], [place] [address]  Phone [phone] Fax [phone] Note: This document was prepared with digital dictation and possible smart phrase technology. Any transcriptional errors that result from this process are unintentional.

## 2021-10-03 NOTE — Patient Instructions (Addendum)
Your Plan:  Continue gabapentin 800mg  four time daily  Will follow up regarding possible benefit of injectable medication     Follow up with Dr. Leonie Man in 6 months or call earlier if needed     Thank you for coming to see [place] at [facility]. I hope we have been able to provide you high quality care today.  You may receive a patient satisfaction survey over the next few weeks. We would appreciate your feedback and comments so that we may continue to improve ourselves and the health of our patients.

## 2021-10-09 ENCOUNTER — Telehealth: Payer: Self-pay | Admitting: Adult Health

## 2021-10-09 DIAGNOSIS — Z5181 Encounter for therapeutic drug level monitoring: Secondary | ICD-10-CM

## 2021-10-09 MED ORDER — CARBAMAZEPINE ER 100 MG PO CP12: ORAL_CAPSULE | ORAL | 0 refills | Status: DC

## 2021-10-09 MED ORDER — CARBAMAZEPINE ER 100 MG PO CP12: 100.0000 mg | ORAL_CAPSULE | Freq: Three times a day (TID) | ORAL | 11 refills | Status: DC

## 2021-10-09 NOTE — Telephone Encounter (Signed)
Please advise patient of Dr. Clydene Fake recommendation for continued neuropathy symptoms:   "Try low dose carbamezapine 100 mg bid x 1 week and then three times daily. Check CBC every 2 weeks x 3 months and then q 3 monthly x 1 year "   Lab work can be repeated in office - serial lab orders will be placed. Please ensure he comes to office 2 weeks after starting.

## 2021-10-09 NOTE — Telephone Encounter (Signed)
Contacted pt, informed him Dr Leonie Man recommends him to Try low dose carbamezapine 100 mg bid x 1 week and then three times daily. Check CBC every 2 weeks x 3 months and then q 3 monthly x 1 year. Lab work can be repeated in office. Please ensure you come to office 2 weeks after starting medication. Will send to pts mychart as well for reference.

## 2021-10-12 ENCOUNTER — Encounter: Payer: Self-pay | Admitting: Family Medicine

## 2021-10-12 ENCOUNTER — Ambulatory Visit (INDEPENDENT_AMBULATORY_CARE_PROVIDER_SITE_OTHER): Payer: BC Managed Care – PPO

## 2021-10-12 ENCOUNTER — Ambulatory Visit (INDEPENDENT_AMBULATORY_CARE_PROVIDER_SITE_OTHER): Payer: BC Managed Care – PPO | Admitting: Family Medicine

## 2021-10-12 VITALS — BP 147/89 | HR 57 | Ht 70.0 in | Wt 182.0 lb

## 2021-10-12 DIAGNOSIS — M25551 Pain in right hip: Secondary | ICD-10-CM

## 2021-10-12 NOTE — Progress Notes (Signed)
BP (!) 147/89    Pulse (!) 57    Ht 5\' 10"  (1.778 m)    Wt 182 lb (82.6 kg)    SpO2 100%    BMI 26.11 kg/m    Subjective:   Patient ID: Philip Hernandez, male    DOB: [birth year], 66 y.o.   MRN: [id number]  HPI: Philip Hernandez is a 66 y.o. male presenting on 10/12/2021 for Abdominal Pain   HPI Patient is coming in today with right lower quadrant/groin pain on the right side.  He says its been bothering him off and on for 4 to 6 months.  It comes off and on.  It would last for a few seconds to up to 30 minutes.  He says it will flareup but sometimes and is not necessarily related to bowel habits.  He was seen here and given stool softeners and it did not seem to help or change at all.  He denies any bulging but does feel like it is little more puffy on the right side of his groin.  He says it hurts more when he is on his feet or walking and gets better when he sits down.  He says he has daily bowel movements and denies any straining or constipation.  He says its been more intense recently over the past few weeks when it comes it lasts a little bit longer and is more painful.  Relevant past medical, surgical, family and social history reviewed and updated as indicated. Interim medical history since our last visit reviewed. Allergies and medications reviewed and updated.  Review of Systems  Constitutional:  Negative for chills and fever.  Respiratory:  Negative for shortness of breath and wheezing.   Cardiovascular:  Negative for chest pain and leg swelling.  Musculoskeletal:  Positive for arthralgias. Negative for back pain and gait problem.  Skin:  Negative for rash.  All other systems reviewed and are negative.  Per HPI unless specifically indicated above   Allergies as of 10/12/2021       Reactions   Corn-containing Products         Medication List        Accurate as of October 12, 2021 11:07 AM. If you have any questions, ask your nurse or doctor.          acetaminophen 500  MG tablet Commonly known as: TYLENOL Take 500 mg by mouth every 6 (six) hours as needed.   AIRBORNE PO Take by mouth daily.   carbamazepine 100 MG 12 hr capsule Commonly known as: CARBATROL 1 cap BID x 7 days then increase to 1 cap TID thereafter   carbamazepine 100 MG 12 hr capsule Commonly known as: CARBATROL Take 1 capsule (100 mg total) by mouth 3 (three) times daily. Start taking on: November 09, 2021   cetirizine 10 MG tablet Commonly known as: ZYRTEC Take 10 mg by mouth daily.   cholecalciferol 1000 units tablet Commonly known as: VITAMIN D Take 1,000 Units by mouth daily.   gabapentin 800 MG tablet Commonly known as: NEURONTIN TAKE 1 TABLET BY MOUTH 4  TIMES DAILY   omeprazole 40 MG capsule Commonly known as: PRILOSEC TAKE ONE CAPSULE BY MOUTH TWICE A DAY   rosuvastatin 10 MG tablet Commonly known as: CRESTOR Take 1 tablet (10 mg total) by mouth daily.         Objective:   BP (!) 147/89    Pulse (!) 57    Ht 5\' 10"  (1.778 m)  Wt 182 lb (82.6 kg)    SpO2 100%    BMI 26.11 kg/m   Wt Readings from Last 3 Encounters:  10/12/21 182 lb (82.6 kg)  10/03/21 186 lb (84.4 kg)  07/07/21 181 lb 4 oz (82.2 kg)    Physical Exam Vitals and nursing note reviewed.  Constitutional:      Appearance: Normal appearance.  Abdominal:     General: Abdomen is flat. Bowel sounds are normal. There is no distension or abdominal bruit.     Palpations: Abdomen is soft.     Tenderness: There is no abdominal tenderness. There is no right CVA tenderness, left CVA tenderness, guarding or rebound.     Hernia: No hernia is present.  Musculoskeletal:     Right hip: Tenderness present. No deformity, bony tenderness or crepitus. Normal range of motion.       Legs:  Neurological:     Mental Status: He is alert.    Right hip x-ray: Pending radiology read  Assessment & Plan:   Problem List Items Addressed This Visit   None Visit Diagnoses     Right hip pain    -  Primary    Relevant Orders   DG HIP UNILAT W OR W/O PELVIS 2-3 VIEWS RIGHT       Will do x-ray and then based treatment based off of that but do think he is having either muscular or arthritic pain in that right groin or hip Follow up plan: Return if symptoms worsen or fail to improve.  Counseling provided for all of the vaccine components Orders Placed This Encounter  Procedures   DG HIP UNILAT W OR W/O PELVIS 2-3 [place] Nijah Orlich, MD [facility] 10/12/2021, 11:07 AM

## 2021-10-16 ENCOUNTER — Other Ambulatory Visit: Payer: Self-pay | Admitting: Family Medicine

## 2021-10-16 DIAGNOSIS — M25551 Pain in right hip: Secondary | ICD-10-CM

## 2021-10-16 MED ORDER — PREDNISONE 20 MG PO TABS: ORAL_TABLET | ORAL | 0 refills | Status: DC

## 2021-10-18 DIAGNOSIS — H2513 Age-related nuclear cataract, bilateral: Secondary | ICD-10-CM | POA: Diagnosis not present

## 2021-10-18 DIAGNOSIS — H43812 Vitreous degeneration, left eye: Secondary | ICD-10-CM | POA: Diagnosis not present

## 2021-11-01 ENCOUNTER — Other Ambulatory Visit (INDEPENDENT_AMBULATORY_CARE_PROVIDER_SITE_OTHER): Payer: Self-pay

## 2021-11-01 DIAGNOSIS — Z5181 Encounter for therapeutic drug level monitoring: Secondary | ICD-10-CM

## 2021-11-01 DIAGNOSIS — Z0289 Encounter for other administrative examinations: Secondary | ICD-10-CM

## 2021-11-02 LAB — CBC WITH DIFFERENTIAL/PLATELET
Basophils Absolute: 0.1 10*3/uL (ref 0.0–0.2)
Basos: 1 %
EOS (ABSOLUTE): 0.2 10*3/uL (ref 0.0–0.4)
Eos: 2 %
Hematocrit: 42.1 % (ref 37.5–51.0)
Hemoglobin: 14.4 g/dL (ref 13.0–17.7)
Immature Grans (Abs): 0 10*3/uL (ref 0.0–0.1)
Immature Granulocytes: 1 %
Lymphocytes Absolute: 1.7 10*3/uL (ref 0.7–3.1)
Lymphs: 19 %
MCH: 28.9 pg (ref 26.6–33.0)
MCHC: 34.2 g/dL (ref 31.5–35.7)
MCV: 85 fL (ref 79–97)
Monocytes Absolute: 0.9 10*3/uL (ref 0.1–0.9)
Monocytes: 10 %
Neutrophils Absolute: 6 10*3/uL (ref 1.4–7.0)
Neutrophils: 67 %
Platelets: 155 10*3/uL (ref 150–450)
RBC: 4.98 x10E6/uL (ref 4.14–5.80)
RDW: 13.1 % (ref 11.6–15.4)
WBC: 8.8 10*3/uL (ref 3.4–10.8)

## 2021-11-15 ENCOUNTER — Other Ambulatory Visit (INDEPENDENT_AMBULATORY_CARE_PROVIDER_SITE_OTHER): Payer: Self-pay

## 2021-11-15 DIAGNOSIS — Z5181 Encounter for therapeutic drug level monitoring: Secondary | ICD-10-CM

## 2021-11-15 DIAGNOSIS — Z0289 Encounter for other administrative examinations: Secondary | ICD-10-CM

## 2021-11-16 LAB — CBC WITH DIFFERENTIAL/PLATELET
Basophils Absolute: 0 10*3/uL (ref 0.0–0.2)
Basos: 1 %
EOS (ABSOLUTE): 0.2 10*3/uL (ref 0.0–0.4)
Eos: 3 %
Hematocrit: 44.1 % (ref 37.5–51.0)
Hemoglobin: 14.9 g/dL (ref 13.0–17.7)
Immature Grans (Abs): 0 10*3/uL (ref 0.0–0.1)
Immature Granulocytes: 1 %
Lymphocytes Absolute: 1.4 10*3/uL (ref 0.7–3.1)
Lymphs: 23 %
MCH: 28.7 pg (ref 26.6–33.0)
MCHC: 33.8 g/dL (ref 31.5–35.7)
MCV: 85 fL (ref 79–97)
Monocytes Absolute: 0.6 10*3/uL (ref 0.1–0.9)
Monocytes: 10 %
Neutrophils Absolute: 3.8 10*3/uL (ref 1.4–7.0)
Neutrophils: 62 %
Platelets: 154 10*3/uL (ref 150–450)
RBC: 5.2 x10E6/uL (ref 4.14–5.80)
RDW: 13 % (ref 11.6–15.4)
WBC: 6 10*3/uL (ref 3.4–10.8)

## 2021-11-27 DIAGNOSIS — L814 Other melanin hyperpigmentation: Secondary | ICD-10-CM | POA: Diagnosis not present

## 2021-11-27 DIAGNOSIS — L821 Other seborrheic keratosis: Secondary | ICD-10-CM | POA: Diagnosis not present

## 2021-11-27 DIAGNOSIS — D225 Melanocytic nevi of trunk: Secondary | ICD-10-CM | POA: Diagnosis not present

## 2021-11-27 DIAGNOSIS — Z08 Encounter for follow-up examination after completed treatment for malignant neoplasm: Secondary | ICD-10-CM | POA: Diagnosis not present

## 2021-11-27 DIAGNOSIS — Z85828 Personal history of other malignant neoplasm of skin: Secondary | ICD-10-CM | POA: Diagnosis not present

## 2021-11-27 DIAGNOSIS — L570 Actinic keratosis: Secondary | ICD-10-CM | POA: Diagnosis not present

## 2021-11-29 ENCOUNTER — Other Ambulatory Visit (INDEPENDENT_AMBULATORY_CARE_PROVIDER_SITE_OTHER): Payer: Self-pay

## 2021-11-29 DIAGNOSIS — Z5181 Encounter for therapeutic drug level monitoring: Secondary | ICD-10-CM | POA: Diagnosis not present

## 2021-11-30 ENCOUNTER — Other Ambulatory Visit: Payer: Self-pay | Admitting: Adult Health

## 2021-11-30 LAB — CBC WITH DIFFERENTIAL/PLATELET
Basophils Absolute: 0.1 10*3/uL (ref 0.0–0.2)
Basos: 1 %
EOS (ABSOLUTE): 0.2 10*3/uL (ref 0.0–0.4)
Eos: 2 %
Hematocrit: 43 % (ref 37.5–51.0)
Hemoglobin: 14.7 g/dL (ref 13.0–17.7)
Immature Grans (Abs): 0 10*3/uL (ref 0.0–0.1)
Immature Granulocytes: 0 %
Lymphocytes Absolute: 2.3 10*3/uL (ref 0.7–3.1)
Lymphs: 28 %
MCH: 28.9 pg (ref 26.6–33.0)
MCHC: 34.2 g/dL (ref 31.5–35.7)
MCV: 85 fL (ref 79–97)
Monocytes Absolute: 0.7 10*3/uL (ref 0.1–0.9)
Monocytes: 8 %
Neutrophils Absolute: 4.9 10*3/uL (ref 1.4–7.0)
Neutrophils: 61 %
Platelets: 142 10*3/uL — ABNORMAL LOW (ref 150–450)
RBC: 5.09 x10E6/uL (ref 4.14–5.80)
RDW: 13.3 % (ref 11.6–15.4)
WBC: 8.1 10*3/uL (ref 3.4–10.8)

## 2021-12-04 ENCOUNTER — Other Ambulatory Visit: Payer: Self-pay

## 2021-12-04 ENCOUNTER — Telehealth: Payer: Self-pay | Admitting: Family Medicine

## 2021-12-04 DIAGNOSIS — E782 Mixed hyperlipidemia: Secondary | ICD-10-CM

## 2021-12-04 NOTE — Telephone Encounter (Signed)
Future orders have been placed.

## 2021-12-08 ENCOUNTER — Other Ambulatory Visit: Payer: BC Managed Care – PPO

## 2021-12-08 DIAGNOSIS — E782 Mixed hyperlipidemia: Secondary | ICD-10-CM | POA: Diagnosis not present

## 2021-12-09 LAB — CBC WITH DIFFERENTIAL/PLATELET
Basophils Absolute: 0.1 10*3/uL (ref 0.0–0.2)
Basos: 1 %
EOS (ABSOLUTE): 0.2 10*3/uL (ref 0.0–0.4)
Eos: 3 %
Hematocrit: 40.6 % (ref 37.5–51.0)
Hemoglobin: 13.9 g/dL (ref 13.0–17.7)
Immature Grans (Abs): 0 10*3/uL (ref 0.0–0.1)
Immature Granulocytes: 0 %
Lymphocytes Absolute: 1.7 10*3/uL (ref 0.7–3.1)
Lymphs: 28 %
MCH: 29.1 pg (ref 26.6–33.0)
MCHC: 34.2 g/dL (ref 31.5–35.7)
MCV: 85 fL (ref 79–97)
Monocytes Absolute: 0.7 10*3/uL (ref 0.1–0.9)
Monocytes: 11 %
Neutrophils Absolute: 3.5 10*3/uL (ref 1.4–7.0)
Neutrophils: 57 %
Platelets: 125 10*3/uL — ABNORMAL LOW (ref 150–450)
RBC: 4.77 x10E6/uL (ref 4.14–5.80)
RDW: 13.1 % (ref 11.6–15.4)
WBC: 6.1 10*3/uL (ref 3.4–10.8)

## 2021-12-09 LAB — CMP14+EGFR
ALT: 19 IU/L (ref 0–44)
AST: 18 IU/L (ref 0–40)
Albumin/Globulin Ratio: 2.4 — ABNORMAL HIGH (ref 1.2–2.2)
Albumin: 4.4 g/dL (ref 3.8–4.8)
Alkaline Phosphatase: 84 IU/L (ref 44–121)
BUN/Creatinine Ratio: 15 (ref 10–24)
BUN: 18 mg/dL (ref 8–27)
Bilirubin Total: 0.3 mg/dL (ref 0.0–1.2)
CO2: 26 mmol/L (ref 20–29)
Calcium: 9.9 mg/dL (ref 8.6–10.2)
Chloride: 107 mmol/L — ABNORMAL HIGH (ref 96–106)
Creatinine, Ser: 1.17 mg/dL (ref 0.76–1.27)
Globulin, Total: 1.8 g/dL (ref 1.5–4.5)
Glucose: 87 mg/dL (ref 70–99)
Potassium: 5.6 mmol/L — ABNORMAL HIGH (ref 3.5–5.2)
Sodium: 144 mmol/L (ref 134–144)
Total Protein: 6.2 g/dL (ref 6.0–8.5)
eGFR: 70 mL/min/{1.73_m2} (ref >59)

## 2021-12-09 LAB — LIPID PANEL
Chol/HDL Ratio: 3.5 ratio (ref 0.0–5.0)
Cholesterol, Total: 142 mg/dL (ref 100–199)
HDL: 41 mg/dL (ref >39)
LDL Chol Calc (NIH): 80 mg/dL (ref 0–99)
Triglycerides: 113 mg/dL (ref 0–149)
VLDL Cholesterol Cal: 21 mg/dL (ref 5–40)

## 2021-12-09 LAB — TSH: TSH: 2.58 u[IU]/mL (ref 0.450–4.500)

## 2021-12-13 ENCOUNTER — Other Ambulatory Visit (INDEPENDENT_AMBULATORY_CARE_PROVIDER_SITE_OTHER): Payer: Self-pay

## 2021-12-13 DIAGNOSIS — Z0289 Encounter for other administrative examinations: Secondary | ICD-10-CM

## 2021-12-13 DIAGNOSIS — Z5181 Encounter for therapeutic drug level monitoring: Secondary | ICD-10-CM | POA: Diagnosis not present

## 2021-12-14 ENCOUNTER — Encounter: Payer: Self-pay | Admitting: Family Medicine

## 2021-12-14 ENCOUNTER — Ambulatory Visit (INDEPENDENT_AMBULATORY_CARE_PROVIDER_SITE_OTHER): Payer: BC Managed Care – PPO | Admitting: Family Medicine

## 2021-12-14 VITALS — BP 132/78 | HR 60 | Ht 70.0 in | Wt 184.0 lb

## 2021-12-14 DIAGNOSIS — R1031 Right lower quadrant pain: Secondary | ICD-10-CM

## 2021-12-14 DIAGNOSIS — Z0000 Encounter for general adult medical examination without abnormal findings: Secondary | ICD-10-CM

## 2021-12-14 DIAGNOSIS — K219 Gastro-esophageal reflux disease without esophagitis: Secondary | ICD-10-CM | POA: Diagnosis not present

## 2021-12-14 DIAGNOSIS — E782 Mixed hyperlipidemia: Secondary | ICD-10-CM | POA: Diagnosis not present

## 2021-12-14 DIAGNOSIS — Z0001 Encounter for general adult medical examination with abnormal findings: Secondary | ICD-10-CM

## 2021-12-14 DIAGNOSIS — R198 Other specified symptoms and signs involving the digestive system and abdomen: Secondary | ICD-10-CM | POA: Diagnosis not present

## 2021-12-14 LAB — COMPREHENSIVE METABOLIC PANEL
ALT: 21 IU/L (ref 0–44)
AST: 17 IU/L (ref 0–40)
Albumin/Globulin Ratio: 2.6 — ABNORMAL HIGH (ref 1.2–2.2)
Albumin: 4.5 g/dL (ref 3.8–4.8)
Alkaline Phosphatase: 90 IU/L (ref 44–121)
BUN/Creatinine Ratio: 10 (ref 10–24)
BUN: 11 mg/dL (ref 8–27)
Bilirubin Total: 0.3 mg/dL (ref 0.0–1.2)
CO2: 26 mmol/L (ref 20–29)
Calcium: 9.3 mg/dL (ref 8.6–10.2)
Chloride: 106 mmol/L (ref 96–106)
Creatinine, Ser: 1.06 mg/dL (ref 0.76–1.27)
Globulin, Total: 1.7 g/dL (ref 1.5–4.5)
Glucose: 82 mg/dL (ref 70–99)
Potassium: 4.9 mmol/L (ref 3.5–5.2)
Sodium: 144 mmol/L (ref 134–144)
Total Protein: 6.2 g/dL (ref 6.0–8.5)
eGFR: 78 mL/min/{1.73_m2} (ref >59)

## 2021-12-14 LAB — CBC WITH DIFFERENTIAL/PLATELET
Basophils Absolute: 0.1 10*3/uL (ref 0.0–0.2)
Basos: 1 %
EOS (ABSOLUTE): 0.2 10*3/uL (ref 0.0–0.4)
Eos: 3 %
Hematocrit: 39.7 % (ref 37.5–51.0)
Hemoglobin: 13.8 g/dL (ref 13.0–17.7)
Immature Grans (Abs): 0 10*3/uL (ref 0.0–0.1)
Immature Granulocytes: 0 %
Lymphocytes Absolute: 1.6 10*3/uL (ref 0.7–3.1)
Lymphs: 26 %
MCH: 29.5 pg (ref 26.6–33.0)
MCHC: 34.8 g/dL (ref 31.5–35.7)
MCV: 85 fL (ref 79–97)
Monocytes Absolute: 0.6 10*3/uL (ref 0.1–0.9)
Monocytes: 10 %
Neutrophils Absolute: 3.6 10*3/uL (ref 1.4–7.0)
Neutrophils: 60 %
Platelets: 127 10*3/uL — ABNORMAL LOW (ref 150–450)
RBC: 4.68 x10E6/uL (ref 4.14–5.80)
RDW: 13.2 % (ref 11.6–15.4)
WBC: 6.1 10*3/uL (ref 3.4–10.8)

## 2021-12-14 LAB — CARBAMAZEPINE LEVEL, TOTAL: Carbamazepine (Tegretol), S: 6.2 ug/mL (ref 4.0–12.0)

## 2021-12-14 MED ORDER — OMEPRAZOLE 40 MG PO CPDR: DELAYED_RELEASE_CAPSULE | ORAL | 3 refills | Status: DC

## 2021-12-14 NOTE — Progress Notes (Signed)
? ?BP 132/78   Pulse 60   Ht $R'5\' 10"'TI$  (1.778 m)   Wt 184 lb (83.5 kg)   SpO2 100%   BMI 26.40 kg/m?   ? ?Subjective:  ? ?Patient ID: Philip Hernandez, male    DOB: 31-Jul-1956, 66 y.o.   MRN: [id number] ? ?HPI: ?Philip Hernandez is a 66 y.o. male presenting on 12/14/2021 for Medical Management of Chronic Issues (CPE) and Abdominal Pain (Right plevic pain- can be sharp and last for hours) ? ? ?HPI ?Physical exam ?Patient is coming in today for physical exam and recheck of chronic medical issues.  Patient denies any chest pain, shortness of breath, headaches or vision issues, diarrhea, nausea, vomiting, or joint issues.  ? ?He can continues to have abdominal pain/right lower quadrant pain.  He says it hurts with bowel movements and not necessarily with up and walking around.  We did treat him with some prednisone for possible hip pain where it was hurting but he does not feel like that helped at all.  He says now it is more associated with bowel movements.  He does have a family history of colon cancer and is concerned about that.  He did have a colonoscopy a couple years ago but would like to go back to gastroenterology.  He denies any nausea or vomiting.  He says is happened 3 times over the past few months.  He denies any blood in his stool or major constipation.  He says when it does happen the last for couple hours and go away on its own.  He thinks it may have happened more after or with a bowel movement recently.  He does not have the pain today at all he denies any urinary issues.  He denies any normal bowel movement issues. ? ?Hyperlipidemia ?Patient is coming in for recheck of his hyperlipidemia. The patient is currently taking Crestor. They deny any issues with myalgias or history of liver damage from it. They deny any focal numbness or weakness or chest pain.  ? ?GERD ?Patient is currently on omeprazole.  She denies any major symptoms or abdominal pain or belching or burping. She denies any blood in her stool or  lightheadedness or dizziness.  ? ?Relevant past medical, surgical, family and social history reviewed and updated as indicated. Interim medical history since our last visit reviewed. ?Allergies and medications reviewed and updated. ? ?Review of Systems  ?Constitutional:  Negative for chills and fever.  ?Eyes:  Negative for visual disturbance.  ?Respiratory:  Negative for shortness of breath and wheezing.   ?Cardiovascular:  Negative for chest pain and leg swelling.  ?Gastrointestinal:  Positive for abdominal pain and constipation. Negative for blood in stool, diarrhea, nausea and vomiting.  ?Musculoskeletal:  Negative for back pain and gait problem.  ?Skin:  Negative for rash.  ?All other systems reviewed and are negative. ? ?Per HPI unless specifically indicated above ? ? ?Allergies as of 12/14/2021   ? ?   Reactions  ? Corn-containing Products   ? ?  ? ?  ?Medication List  ?  ? ?  ? Accurate as of December 14, 2021 10:54 AM. If you have any questions, ask your nurse or doctor.  ?  ?  ? ?  ? ?STOP taking these medications   ? ?predniSONE 20 MG tablet ?Commonly known as: DELTASONE ?Stopped by: Worthy Rancher, MD ?  ? ?  ? ?TAKE these medications   ? ?acetaminophen 500 MG tablet ?Commonly known as: TYLENOL ?Take  500 mg by mouth every 6 (six) hours as needed. ?  ?AIRBORNE PO ?Take by mouth daily. ?  ?carbamazepine 100 MG 12 hr capsule ?Commonly known as: CARBATROL ?1 cap BID x 7 days then increase to 1 cap TID thereafter ?  ?carbamazepine 100 MG 12 hr capsule ?Commonly known as: CARBATROL ?Take 1 capsule (100 mg total) by mouth 3 (three) times daily. ?  ?cetirizine 10 MG tablet ?Commonly known as: ZYRTEC ?Take 10 mg by mouth daily. ?  ?cholecalciferol 1000 units tablet ?Commonly known as: VITAMIN D ?Take 1,000 Units by mouth daily. ?  ?gabapentin 800 MG tablet ?Commonly known as: NEURONTIN ?TAKE 1 TABLET BY MOUTH 4  TIMES DAILY ?  ?omeprazole 40 MG capsule ?Commonly known as: PRILOSEC ?TAKE ONE CAPSULE BY MOUTH TWICE  A DAY ?  ?rosuvastatin 10 MG tablet ?Commonly known as: CRESTOR ?Take 1 tablet (10 mg total) by mouth daily. ?  ? ?  ? ? ? ?Objective:  ? ?BP 132/78   Pulse 60   Ht $R'5\' 10"'Ny$  (1.778 m)   Wt 184 lb (83.5 kg)   SpO2 100%   BMI 26.40 kg/m?   ?Wt Readings from Last 3 Encounters:  ?12/14/21 184 lb (83.5 kg)  ?10/12/21 182 lb (82.6 kg)  ?10/03/21 186 lb (84.4 kg)  ?  ?Physical Exam ?Vitals and nursing note reviewed.  ?Constitutional:   ?   General: He is not in acute distress. ?   Appearance: He is well-developed. He is not diaphoretic.  ?HENT:  ?   Right Ear: External ear normal.  ?   Left Ear: External ear normal.  ?   Nose: Nose normal.  ?   Mouth/Throat:  ?   Pharynx: No oropharyngeal exudate.  ?Eyes:  ?   General: No scleral icterus. ?   Conjunctiva/sclera: Conjunctivae normal.  ?   Pupils: Pupils are equal, round, and reactive to light.  ?Neck:  ?   Thyroid: No thyromegaly.  ?Cardiovascular:  ?   Rate and Rhythm: Normal rate and regular rhythm.  ?   Heart sounds: Normal heart sounds. No murmur heard. ?Pulmonary:  ?   Effort: Pulmonary effort is normal. No respiratory distress.  ?   Breath sounds: Normal breath sounds. No wheezing.  ?Abdominal:  ?   General: Bowel sounds are normal. There is no distension.  ?   Palpations: Abdomen is soft. There is no mass.  ?   Tenderness: There is no abdominal tenderness. There is no right CVA tenderness, left CVA tenderness, guarding or rebound.  ?   Hernia: No hernia is present.  ?Musculoskeletal:     ?   General: No swelling or tenderness. Normal range of motion.  ?   Cervical back: Neck supple.  ?Lymphadenopathy:  ?   Cervical: No cervical adenopathy.  ?Skin: ?   General: Skin is warm and dry.  ?   Findings: No rash.  ?Neurological:  ?   Mental Status: He is alert and oriented to person, place, and time.  ?   Coordination: Coordination normal.  ?Psychiatric:     ?   Behavior: Behavior normal.  ? ? ?Results for orders placed or performed in visit on 12/13/21  ?Carbamazepine  level, total  ?Result Value Ref Range  ? Carbamazepine (Tegretol), S 6.2 4.0 - 12.0 ug/mL  ?CMP  ?Result Value Ref Range  ? Glucose 82 70 - 99 mg/dL  ? BUN 11 8 - 27 mg/dL  ? Creatinine, Ser 1.06 0.76 - 1.27 mg/dL  ?  eGFR 78 >59 mL/min/1.73  ? BUN/Creatinine Ratio 10 10 - 24  ? Sodium 144 134 - 144 mmol/L  ? Potassium 4.9 3.5 - 5.2 mmol/L  ? Chloride 106 96 - 106 mmol/L  ? CO2 26 20 - 29 mmol/L  ? Calcium 9.3 8.6 - 10.2 mg/dL  ? Total Protein 6.2 6.0 - 8.5 g/dL  ? Albumin 4.5 3.8 - 4.8 g/dL  ? Globulin, Total 1.7 1.5 - 4.5 g/dL  ? Albumin/Globulin Ratio 2.6 (H) 1.2 - 2.2  ? Bilirubin Total 0.3 0.0 - 1.2 mg/dL  ? Alkaline Phosphatase 90 44 - 121 IU/L  ? AST 17 0 - 40 IU/L  ? ALT 21 0 - 44 IU/L  ?CBC with Differential/Platelets  ?Result Value Ref Range  ? WBC 6.1 3.4 - 10.8 x10E3/uL  ? RBC 4.68 4.14 - 5.80 x10E6/uL  ? Hemoglobin 13.8 13.0 - 17.7 g/dL  ? Hematocrit 39.7 37.5 - 51.0 %  ? MCV 85 79 - 97 fL  ? MCH 29.5 26.6 - 33.0 pg  ? MCHC 34.8 31.5 - 35.7 g/dL  ? RDW 13.2 11.6 - 15.4 %  ? Platelets 127 (L) 150 - 450 x10E3/uL  ? Neutrophils 60 Not Estab. %  ? Lymphs 26 Not Estab. %  ? Monocytes 10 Not Estab. %  ? Eos 3 Not Estab. %  ? Basos 1 Not Estab. %  ? Neutrophils Absolute 3.6 1.4 - 7.0 x10E3/uL  ? Lymphocytes Absolute 1.6 0.7 - 3.1 x10E3/uL  ? Monocytes Absolute 0.6 0.1 - 0.9 x10E3/uL  ? EOS (ABSOLUTE) 0.2 0.0 - 0.4 x10E3/uL  ? Basophils Absolute 0.1 0.0 - 0.2 x10E3/uL  ? Immature Granulocytes 0 Not Estab. %  ? Immature Grans (Abs) 0.0 0.0 - 0.1 x10E3/uL  ? ? ?Assessment & Plan:  ? ?Problem List Items Addressed This Visit   ? ?  ? Digestive  ? GERD  ? Relevant Medications  ? omeprazole (PRILOSEC) 40 MG capsule  ?  ? Other  ? HLD (hyperlipidemia)  ? ?Other Visit Diagnoses   ? ? Physical exam    -  Primary  ? Relevant Medications  ? omeprazole (PRILOSEC) 40 MG capsule  ? Pain with bowel movements      ? Relevant Orders  ? Ambulatory referral to Gastroenterology  ? Right lower quadrant abdominal pain      ? ?  ?   ?We will send to gastroenterology for abdominal pains. ? ?Blood work otherwise look decent.  Follow-up in 6 months ?Follow up plan: ?Return in about 6 months (around 06/16/2022), or if symptoms worsen or fail to impr

## 2021-12-27 ENCOUNTER — Other Ambulatory Visit (INDEPENDENT_AMBULATORY_CARE_PROVIDER_SITE_OTHER): Payer: Self-pay

## 2021-12-27 DIAGNOSIS — Z5181 Encounter for therapeutic drug level monitoring: Secondary | ICD-10-CM | POA: Diagnosis not present

## 2021-12-27 DIAGNOSIS — Z0289 Encounter for other administrative examinations: Secondary | ICD-10-CM

## 2021-12-28 LAB — CBC WITH DIFFERENTIAL/PLATELET
Basophils Absolute: 0.1 10*3/uL (ref 0.0–0.2)
Basos: 1 %
EOS (ABSOLUTE): 0.2 10*3/uL (ref 0.0–0.4)
Eos: 3 %
Hematocrit: 40.2 % (ref 37.5–51.0)
Hemoglobin: 13.9 g/dL (ref 13.0–17.7)
Immature Grans (Abs): 0 10*3/uL (ref 0.0–0.1)
Immature Granulocytes: 0 %
Lymphocytes Absolute: 1.6 10*3/uL (ref 0.7–3.1)
Lymphs: 24 %
MCH: 29.6 pg (ref 26.6–33.0)
MCHC: 34.6 g/dL (ref 31.5–35.7)
MCV: 86 fL (ref 79–97)
Monocytes Absolute: 0.7 10*3/uL (ref 0.1–0.9)
Monocytes: 10 %
Neutrophils Absolute: 4.3 10*3/uL (ref 1.4–7.0)
Neutrophils: 62 %
Platelets: 139 10*3/uL — ABNORMAL LOW (ref 150–450)
RBC: 4.69 x10E6/uL (ref 4.14–5.80)
RDW: 13.2 % (ref 11.6–15.4)
WBC: 6.8 10*3/uL (ref 3.4–10.8)

## 2022-01-05 ENCOUNTER — Ambulatory Visit (INDEPENDENT_AMBULATORY_CARE_PROVIDER_SITE_OTHER): Payer: BC Managed Care – PPO | Admitting: Gastroenterology

## 2022-01-05 ENCOUNTER — Encounter: Payer: Self-pay | Admitting: Gastroenterology

## 2022-01-05 VITALS — BP 126/78 | HR 58 | Ht 71.0 in | Wt 186.0 lb

## 2022-01-05 DIAGNOSIS — Z800 Family history of malignant neoplasm of digestive organs: Secondary | ICD-10-CM | POA: Diagnosis not present

## 2022-01-05 DIAGNOSIS — Z79899 Other long term (current) drug therapy: Secondary | ICD-10-CM | POA: Diagnosis not present

## 2022-01-05 DIAGNOSIS — R1031 Right lower quadrant pain: Secondary | ICD-10-CM

## 2022-01-05 DIAGNOSIS — K219 Gastro-esophageal reflux disease without esophagitis: Secondary | ICD-10-CM | POA: Diagnosis not present

## 2022-01-05 MED ORDER — HYOSCYAMINE SULFATE 0.125 MG PO TABS
ORAL_TABLET | ORAL | 0 refills | Status: DC
Start: 2022-01-05 — End: 2022-05-30

## 2022-01-05 NOTE — Patient Instructions (Signed)
You will be contacted directly by [facility] scheduling to schedule a CT scan of your abdomen and pelvis.   ? ? ?You are scheduled on  at . You should arrive 15 minutes prior to your appointment time for registration. Please follow the written instructions below on the day of your exam: ? ?WARNING: IF YOU ARE ALLERGIC TO IODINE/X-RAY DYE, PLEASE NOTIFY RADIOLOGY IMMEDIATELY AT [phone]! YOU WILL BE GIVEN A 13 HOUR PREMEDICATION PREP. ? ?1) Do not eat or drink anything after  (4 hours prior to your test) ? ?2) You have been given 2 bottles of oral contrast to drink. The solution may taste better if refrigerated, but do NOT add ice or any other liquid to this solution. Shake well before drinking. ? ?Drink 1 bottle of contrast @  (2 hours prior to your exam) ?Drink 1 bottle of contrast @  (1 hour prior to your exam) ? ? ?You may take any medications as prescribed with a small amount of water.  If you take Metformin, Glucophage, Glucovance, Avandamet, Riomet, Fortamet, Actoplus Met, Janumet, Glumetza or Metaglip you may be asked to hold this medication 48 hours after the exam.  ? ?The purpose of you drinking the oral contrast is to aid in the visualization of your intestinal tract. The contrast solution may cause some diarrhea. Before your exam is started, you will be given a small amount of fluid to drink. Depending on your individual set of symptoms, you may also receive an intravenous injection of x-ray contrast/dye. Plan on being at [facility] for 30 minutes or long, depending on the type of exam you are having performed. ? ?If you have any questions regarding your exam or if you need to reschedule, you may call the CT department at [phone] between the hours of 8:00 am and 5:00 pm, Monday-Friday. ? ? ?We have sent the following medications to your pharmacy for you to pick up at your convenience: Levsin ?

## 2022-01-05 NOTE — Progress Notes (Signed)
? ?HPI :  ?66 year old male with a strong family history of colon cancer, GERD, history of colon polyps, here for follow-up visit for abdominal pain. ? ?He states in the summer 2022 he started having intermittent episodes of right lower quadrant to lower abdominal pain.  Typical occurrence when this happens is that he will have a bowel movement in the morning, then he will be in the shower, about 15 to 30 minutes after his bowel movement he can develop severe pain in his lower right abdomen.  He rates this as "10 out of 10" when he gets it.  He usually needs to lie down in the bed for about 2-2 and half hours until it passes on its own.  After the episode he feels fine.  The episodes are sporadic without clear triggers.  He states he had 3 episodes in November, then did not have another episode until the end of January, then did not have another episode until he had 1 last week.  He states he is very uncomfortable when this happens.  He feels a bulging of his right lower abdomen when it occurs.  He denies any nausea or vomiting with this.  No blood in his stools.  His bowels are typically usually regular.  Occasionally loose or occasionally hard stools of mostly soft.  He has had some increased gas and bloating at times.  No nausea or vomiting otherwise.  His reflux is under good control, he takes omeprazole 40 mg once daily for the most part.  He is eating well. ? ?Prior to 2022 summer he has never had this symptom before, is very new for him.  Denies any trauma to the abdominal wall.  Denies any worsening with heavy lifting etc.  He has tried taking a stool softener which made no change.  He saw his primary care who thought he may have had a hip problem, given a course of prednisone which did not help.  He denies any problems with walking or positional changes otherwise that can reproduce this. ? ?Recall he has colon cancer multiple first-degree relatives including his mother and brother at age 58, he has had 3  aunts with colon cancer.  He has had negative testing for Lynch syndrome.  His last colonoscopy was in September 2020 and he had 1 small polyp in the ascending colon removed.  He had an EGD at the same time which showed an irregular Z-line, suspected short segment of Barrett's but biopsies were negative for that. ? ?He denies any cardiopulmonary symptoms at baseline. ? ?Colonoscopy 06/05/19: ?The perianal and digital rectal examinations were normal. ?- A 3 mm polyp was found in the cecum. The polyp was sessile. The polyp was removed ?with a cold snare. Resection and retrieval were complete. ?- Internal hemorrhoids were found during retroflexion. The hemorrhoids were moderate. ?- The exam was otherwise without abnormality. ? ?EGD 06/15/19: ?- A 2 cm hiatal hernia was present. ?- There were esophageal mucosal changes classified as Barrett's stage C0-M1 per [place] ?criteria present in the lower third of the esophagus. The maximum longitudinal extent of ?these mucosal changes was 1 cm in length. Biopsies were taken with a cold forceps for ?histology. ?- There was a small benign gastric inlet patch in the proximal esophagus. The exam of the ?esophagus was otherwise normal. ?- The entire examined stomach was normal. ?- The duodenal bulb and second portion of the duodenum were normal. ? ?1. Surgical [P], random sites lower esophagus BX ?- REFLUX CHANGES. ?-  NO INTESTINAL METAPLASIA, DYSPLASIA, OR MALIGNANCY. ?2. Surgical [P], colon, cecum, polyp ?- TUBULAR ADENOMA. ?- NO HIGH GRADE DYSPLASIA OR MALIGNANCY. ? ?Past Medical History:  ?Diagnosis Date  ? Allergy   ? Arthritis   ? Cataract   ? Colon polyp   ? Colon polyps 2015  ? Cough   ? chronic sinus issues, GERD with poor dietary control and Pulmonary Sarcoid.   ? GERD (gastroesophageal reflux disease) Jan 2012  ? [name]. Bx proven.  Deve abd pain in Jan 2012 following high dose steroids for sarcoid. s/p EGD and improvement in symptoms following PPI  ? Hypogonadism male    ? Kidney stone   ? Malignant neoplasm of prostate ([place]) dec 2010  ? [name]. s/p surgery dec 2010, hx of rnormal PSA  since then  ? Other and unspecified mycoses   ? Painful respiration   ? Peripheral neuropathy   ? Prostate cancer ([place])   ? reoccurence (getting radiation 01-14-17 start).  ? Sarcoidosis dec 2011  ? stage 2 pulmonary, neck nodes, splenic granuloma  ? Skin cancer   ? Swelling, mass, or lump in head and neck   ? ? ? ?Past Surgical History:  ?Procedure Laterality Date  ? HERNIA REPAIR  13 yrs ago  ? HERNIA REPAIR    ? LYMPH NODE BIOPSY  05/2010, 07/2010  ? nasal polyps  1984  ? NASAL SINUS SURGERY  2013  ? PROSTATE SURGERY  2010  ? radiation 2018  ? SHOULDER SURGERY Right 2009, 2010  ? x 2  ? SKIN CANCER EXCISION  11/16/14  ? ?Family History  ?Problem Relation Age of Onset  ? Colon cancer Mother 48  ? Skin cancer Mother   ? Cancer Mother   ?     colon  ? Colon cancer Brother 70  ? Skin cancer Brother   ? Cancer Brother   ?     colon  ? Colon cancer Maternal Aunt   ?     dx in her 30s  ? Cancer Maternal Aunt   ?     "cancer of male organs" dx in her 12s  ? Breast cancer Maternal Aunt 66  ?     bilateral, dx again at 16  ? Colon cancer Maternal Aunt 48  ? Stomach cancer Maternal Aunt 91  ? Dementia Father   ? Stomach cancer Maternal Uncle 36  ? Skin cancer Paternal Aunt   ? Prostate cancer Paternal Uncle 39  ? Cancer Paternal Uncle   ?     prostate  ? Stomach cancer Paternal Grandmother   ?     dx in her 75s  ? Stroke Paternal Grandfather   ? Colon cancer Maternal Aunt 68  ? Prostate cancer Paternal Uncle   ? Cancer Paternal Uncle   ?     prostate  ? Prostate cancer Paternal Uncle   ? Kidney cancer Paternal Uncle   ? Lung cancer Paternal Uncle   ? Cancer Paternal Uncle   ?     prostate  ? Lung cancer Paternal Uncle   ? Cancer Paternal Uncle   ?     unknown  ? Cancer Paternal Uncle   ?     prostate  ? Cancer Cousin   ?     paternal cousin with cancer NOS  ? Cancer Cousin   ?     paternal cousin  with cancer NOS  ? Esophageal cancer Neg Hx   ? Rectal  cancer Neg Hx   ? ?Social History  ? ?Tobacco Use  ? Smoking status: Never  ? Smokeless tobacco: Never  ?Vaping Use  ? Vaping Use: Never used  ?Substance Use Topics  ? Alcohol use: No  ? Drug use: No  ? ?Current Outpatient Medications  ?Medication Sig Dispense Refill  ? acetaminophen (TYLENOL) 500 MG tablet Take 500 mg by mouth every 6 (six) hours as needed.    ? carbamazepine (CARBATROL) 100 MG 12 hr capsule Take 1 capsule (100 mg total) by mouth 3 (three) times daily. 90 capsule 11  ? cetirizine (ZYRTEC) 10 MG tablet Take 10 mg by mouth daily.    ? cholecalciferol (VITAMIN D) 1000 UNITS tablet Take 1,000 Units by mouth daily.    ? gabapentin (NEURONTIN) 800 MG tablet TAKE 1 TABLET BY MOUTH 4  TIMES DAILY 360 tablet 3  ? Multiple Vitamins-Minerals (AIRBORNE PO) Take by mouth daily.    ? omeprazole (PRILOSEC) 40 MG capsule TAKE ONE CAPSULE BY MOUTH TWICE A DAY 180 capsule 3  ? rosuvastatin (CRESTOR) 10 MG tablet Take 1 tablet (10 mg total) by mouth daily. 90 tablet 3  ? ?No current facility-administered medications for this visit.  ? ?Allergies  ?Allergen Reactions  ? Corn-Containing Products   ? ? ? ?Review of Systems: ?All systems reviewed and negative except where noted in HPI.  ? ?Lab Results  ?Component Value Date  ? WBC 6.8 12/27/2021  ? HGB 13.9 12/27/2021  ? HCT 40.2 12/27/2021  ? MCV 86 12/27/2021  ? PLT 139 (L) 12/27/2021  ? ? ?Lab Results  ?Component Value Date  ? CREATININE 1.06 12/13/2021  ? BUN 11 12/13/2021  ? NA 144 12/13/2021  ? K 4.9 12/13/2021  ? CL 106 12/13/2021  ? CO2 26 12/13/2021  ? ? ?Lab Results  ?Component Value Date  ? ALT 21 12/13/2021  ? AST 17 12/13/2021  ? ALKPHOS 90 12/13/2021  ? BILITOT 0.3 12/13/2021  ? ? ? ?Physical Exam: ?BP 126/78   Pulse (!) 58   Ht '5\' 11"'$  (1.803 m)   Wt 186 lb (84.4 kg)   BMI 25.94 kg/m?  ?Constitutional: Pleasant,well-developed, male in no acute distress. ?Abdominal: Soft, nondistended, protuberant,  nontender. Negative [place]. No obvious or overt hernia. There are no masses palpable.  ?Extremities: no edema ?Neurological: Alert and oriented to person place and time. ?Psychiatric: Normal mood and affect. B

## 2022-01-10 ENCOUNTER — Other Ambulatory Visit (INDEPENDENT_AMBULATORY_CARE_PROVIDER_SITE_OTHER): Payer: Self-pay

## 2022-01-10 DIAGNOSIS — Z5181 Encounter for therapeutic drug level monitoring: Secondary | ICD-10-CM | POA: Diagnosis not present

## 2022-01-10 DIAGNOSIS — C61 Malignant neoplasm of prostate: Secondary | ICD-10-CM | POA: Diagnosis not present

## 2022-01-10 DIAGNOSIS — Z0289 Encounter for other administrative examinations: Secondary | ICD-10-CM

## 2022-01-11 LAB — CBC WITH DIFFERENTIAL/PLATELET
Basophils Absolute: 0 10*3/uL (ref 0.0–0.2)
Basos: 1 %
EOS (ABSOLUTE): 0.3 10*3/uL (ref 0.0–0.4)
Eos: 4 %
Hematocrit: 39.6 % (ref 37.5–51.0)
Hemoglobin: 13.6 g/dL (ref 13.0–17.7)
Immature Grans (Abs): 0 10*3/uL (ref 0.0–0.1)
Immature Granulocytes: 0 %
Lymphocytes Absolute: 1.5 10*3/uL (ref 0.7–3.1)
Lymphs: 24 %
MCH: 29.3 pg (ref 26.6–33.0)
MCHC: 34.3 g/dL (ref 31.5–35.7)
MCV: 85 fL (ref 79–97)
Monocytes Absolute: 0.6 10*3/uL (ref 0.1–0.9)
Monocytes: 9 %
Neutrophils Absolute: 4.1 10*3/uL (ref 1.4–7.0)
Neutrophils: 62 %
Platelets: 135 10*3/uL — ABNORMAL LOW (ref 150–450)
RBC: 4.64 x10E6/uL (ref 4.14–5.80)
RDW: 13.1 % (ref 11.6–15.4)
WBC: 6.5 10*3/uL (ref 3.4–10.8)

## 2022-01-17 DIAGNOSIS — C61 Malignant neoplasm of prostate: Secondary | ICD-10-CM | POA: Diagnosis not present

## 2022-01-22 ENCOUNTER — Ambulatory Visit (HOSPITAL_COMMUNITY)
Admission: RE | Admit: 2022-01-22 | Discharge: 2022-01-22 | Disposition: A | Discharge status: Home / Self Care | Payer: BC Managed Care – PPO | Source: Ambulatory Visit | Attending: Gastroenterology | Admitting: Gastroenterology

## 2022-01-22 ENCOUNTER — Encounter (HOSPITAL_COMMUNITY): Payer: Self-pay

## 2022-01-22 DIAGNOSIS — K7689 Other specified diseases of liver: Secondary | ICD-10-CM | POA: Diagnosis not present

## 2022-01-22 DIAGNOSIS — N200 Calculus of kidney: Secondary | ICD-10-CM | POA: Diagnosis not present

## 2022-01-22 DIAGNOSIS — I700 Atherosclerosis of aorta: Secondary | ICD-10-CM | POA: Diagnosis not present

## 2022-01-22 DIAGNOSIS — R1031 Right lower quadrant pain: Secondary | ICD-10-CM | POA: Diagnosis not present

## 2022-01-22 MED ORDER — IOHEXOL 300 MG/ML  SOLN
100.0000 mL | Freq: Once | INTRAMUSCULAR | Status: AC | PRN
  Administered 2022-01-22: 100 mL via INTRAVENOUS

## 2022-01-22 MED ORDER — SODIUM CHLORIDE (PF) 0.9 % IJ SOLN
INTRAMUSCULAR | Status: AC
Start: 2022-01-22 — End: 2022-01-22
  Filled 2022-01-22: qty 50

## 2022-01-31 ENCOUNTER — Telehealth: Payer: Self-pay | Admitting: Gastroenterology

## 2022-01-31 NOTE — Telephone Encounter (Signed)
Patient called requesting to speak with Dr. Havery Moros regarding imaging results from 01/22/22. Patient wants to know what exactly he is advising at this moment. Please advise. ?

## 2022-02-01 NOTE — Telephone Encounter (Signed)
Called patient back to discuss CT results. No answer, left message, will call again

## 2022-02-01 NOTE — Telephone Encounter (Signed)
Called again, no answer

## 2022-02-02 IMAGING — DX DG HIP (WITH OR WITHOUT PELVIS) 2-3V*R*
3 series · 3 of 3 positions shown · non-contrast
Comparison: Bilateral hip radiographs 12/08/2012

CLINICAL DATA: Right hip pain.

EXAM:
DG HIP (WITH OR WITHOUT PELVIS) 2-3V RIGHT

[hip ap]
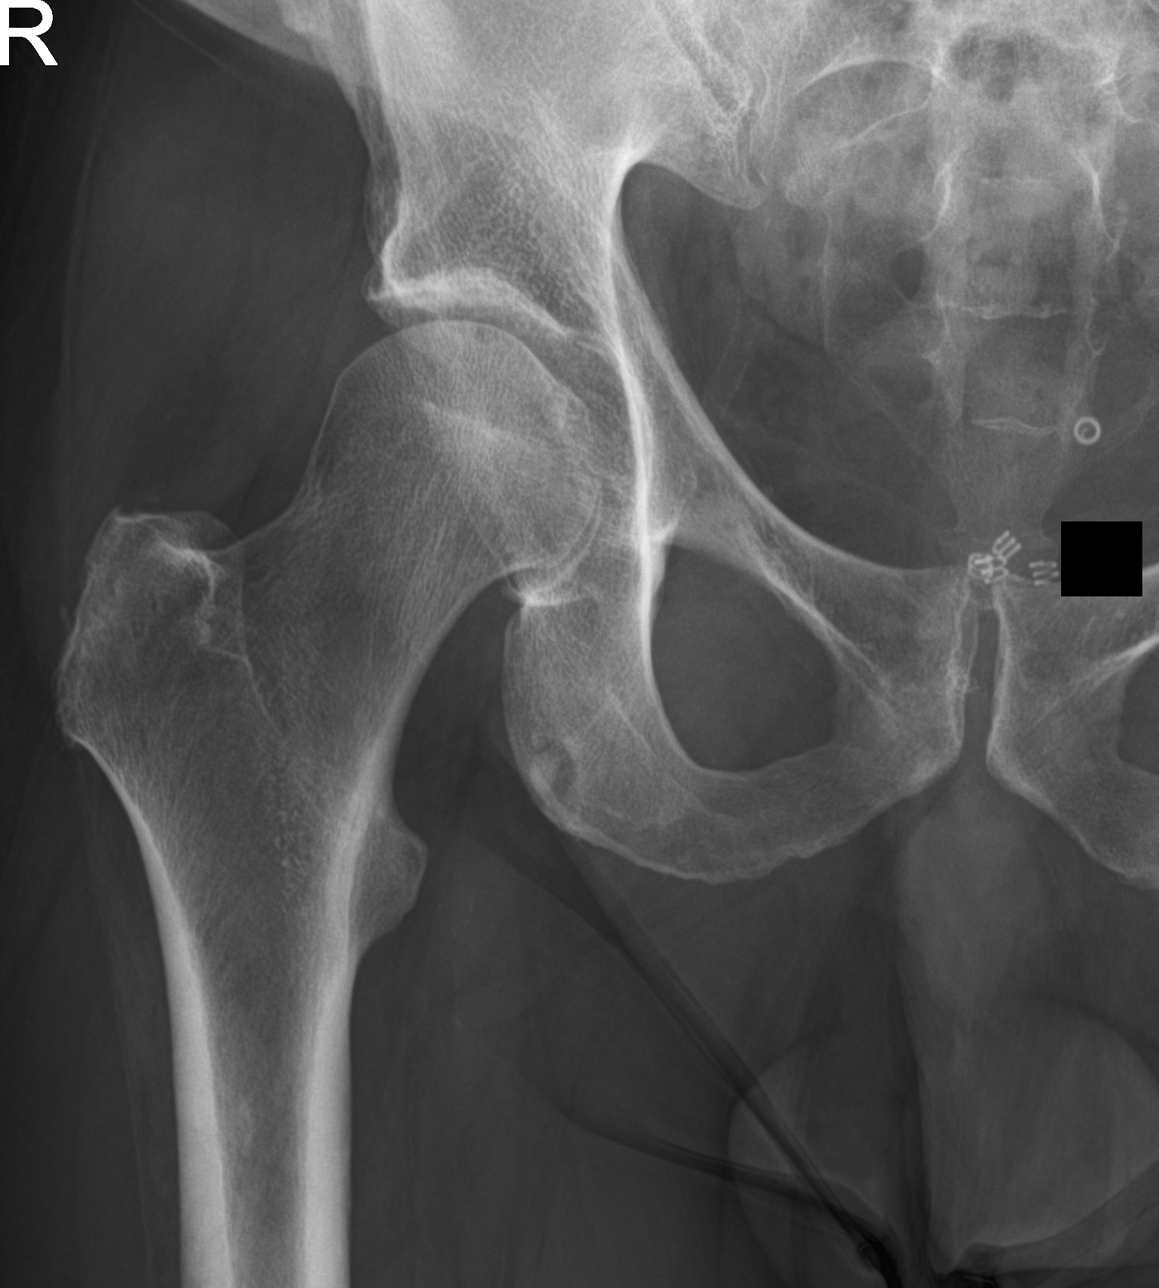

[hip lat]
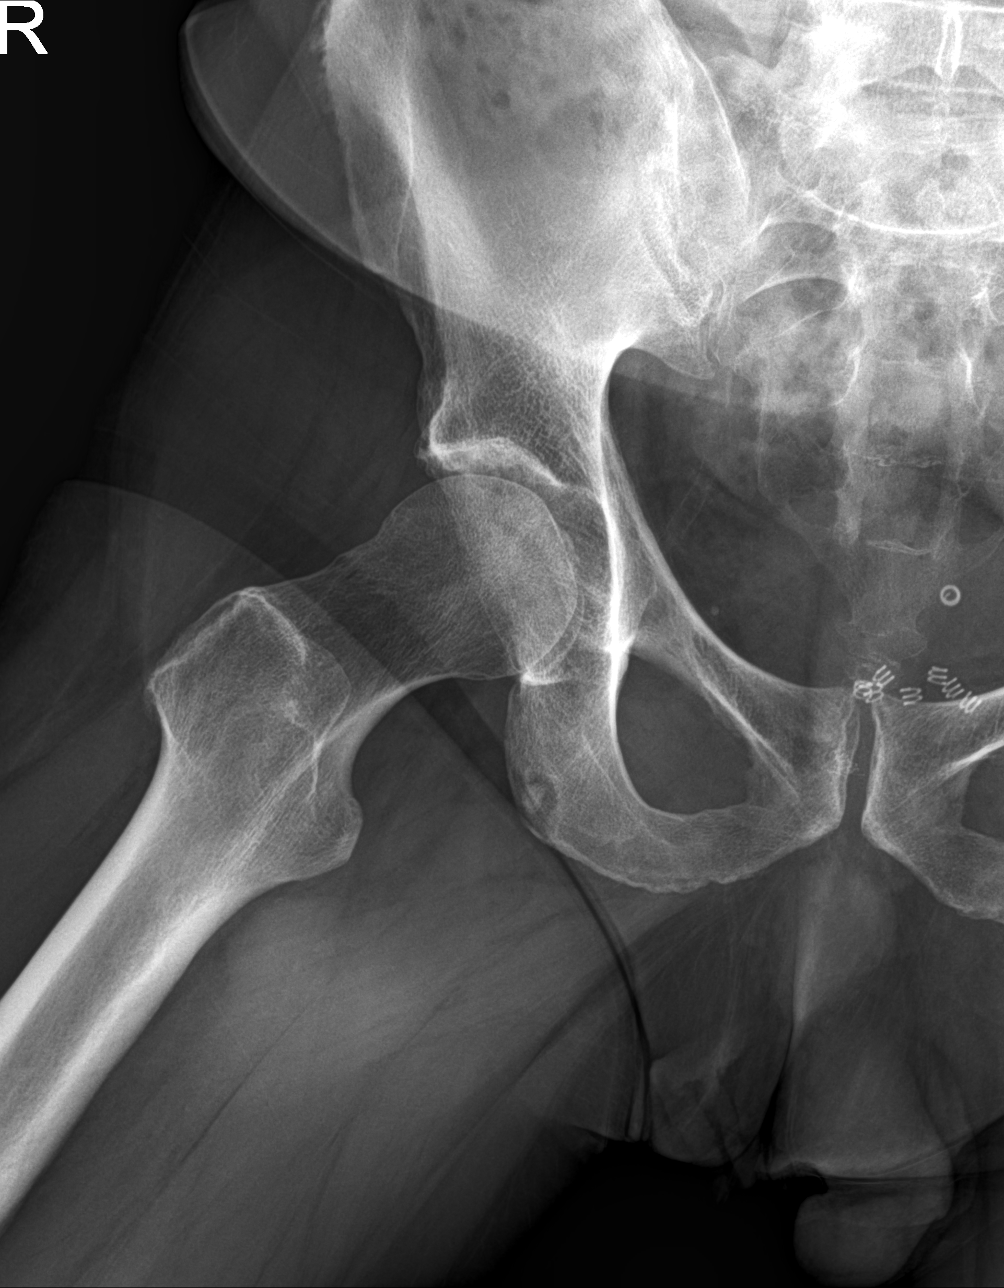

[pelvis ap]
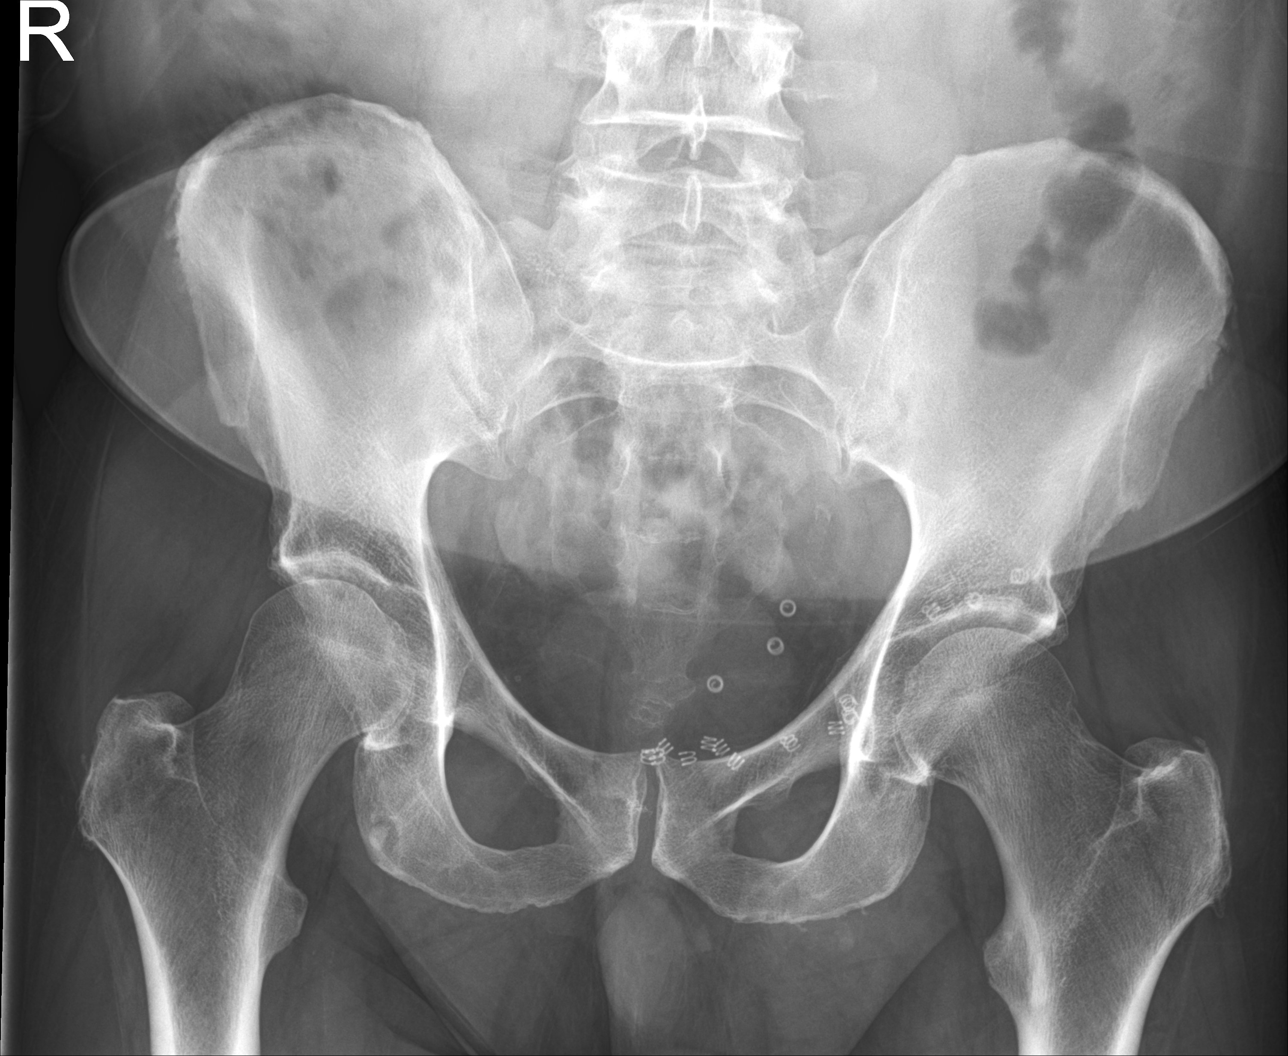

[3 of 3 positions shown; findings below may reference images not displayed]

FINDINGS: Mildly decreased bone mineralization. Mild superior right acetabular
subchondral degenerative cystic change, similar to prior. No
significant joint space narrowing of either femoroacetabular joint.
The bilateral sacroiliac and pubic symphysis joint spaces are
maintained. Hernia mesh coils again overlie the left hemipelvis.
IMPRESSION: No significant change from prior. Minimal right femoroacetabular
osteoarthritis.

## 2022-02-02 NOTE — Telephone Encounter (Signed)
Called both cell phone and home phone again, no answer, left message on each phone line.  Will call back again.

## 2022-02-06 NOTE — Telephone Encounter (Signed)
I called the patient and reviewed his CT scan findings with him.  He has a small right inguinal hernia but it contains the appendix which is a bit odd.  Wonder if his intermittent episodes of pain could be related, if the hernia is dynamic and who knows if the appendix gets twisted or more the colon goes in there.  I do think it may be reasonable for him to see a surgeon for an evaluation and see what they have make of this and if they think worth repairing.  He does continue have episodes of pain.  He is in agreement with this.  [place] can you refer the patient to [facility] surgery, Dr. Donne Hazel if he is available to see this patient.  Thanks

## 2022-02-07 NOTE — Telephone Encounter (Signed)
Referral, patient records, demographic and insurance information faxed to Dr. Donne Hazel at [facility].

## 2022-02-14 DIAGNOSIS — H43812 Vitreous degeneration, left eye: Secondary | ICD-10-CM | POA: Diagnosis not present

## 2022-02-27 DIAGNOSIS — Z85828 Personal history of other malignant neoplasm of skin: Secondary | ICD-10-CM | POA: Diagnosis not present

## 2022-02-27 DIAGNOSIS — L578 Other skin changes due to chronic exposure to nonionizing radiation: Secondary | ICD-10-CM | POA: Diagnosis not present

## 2022-02-27 DIAGNOSIS — Z08 Encounter for follow-up examination after completed treatment for malignant neoplasm: Secondary | ICD-10-CM | POA: Diagnosis not present

## 2022-02-27 DIAGNOSIS — L298 Other pruritus: Secondary | ICD-10-CM | POA: Diagnosis not present

## 2022-04-09 ENCOUNTER — Other Ambulatory Visit: Payer: Self-pay | Admitting: *Deleted

## 2022-04-09 ENCOUNTER — Other Ambulatory Visit (INDEPENDENT_AMBULATORY_CARE_PROVIDER_SITE_OTHER): Payer: Self-pay

## 2022-04-09 ENCOUNTER — Other Ambulatory Visit: Payer: Self-pay | Admitting: Surgery

## 2022-04-09 DIAGNOSIS — Z0289 Encounter for other administrative examinations: Secondary | ICD-10-CM

## 2022-04-09 DIAGNOSIS — G629 Polyneuropathy, unspecified: Secondary | ICD-10-CM

## 2022-04-09 DIAGNOSIS — R799 Abnormal finding of blood chemistry, unspecified: Secondary | ICD-10-CM | POA: Diagnosis not present

## 2022-04-09 DIAGNOSIS — K429 Umbilical hernia without obstruction or gangrene: Secondary | ICD-10-CM | POA: Diagnosis not present

## 2022-04-09 DIAGNOSIS — K4090 Unilateral inguinal hernia, without obstruction or gangrene, not specified as recurrent: Secondary | ICD-10-CM | POA: Diagnosis not present

## 2022-04-09 DIAGNOSIS — Z79899 Other long term (current) drug therapy: Secondary | ICD-10-CM

## 2022-04-10 ENCOUNTER — Encounter: Payer: Self-pay | Admitting: Neurology

## 2022-04-10 ENCOUNTER — Ambulatory Visit (INDEPENDENT_AMBULATORY_CARE_PROVIDER_SITE_OTHER): Payer: BC Managed Care – PPO | Admitting: Neurology

## 2022-04-10 VITALS — BP 121/88 | HR 68 | Ht 71.0 in | Wt 183.0 lb

## 2022-04-10 DIAGNOSIS — M792 Neuralgia and neuritis, unspecified: Secondary | ICD-10-CM

## 2022-04-10 DIAGNOSIS — G629 Polyneuropathy, unspecified: Secondary | ICD-10-CM

## 2022-04-10 LAB — CBC WITH DIFFERENTIAL/PLATELET
Basophils Absolute: 0.1 10*3/uL (ref 0.0–0.2)
Basos: 1 %
EOS (ABSOLUTE): 0.3 10*3/uL (ref 0.0–0.4)
Eos: 4 %
Hematocrit: 42.2 % (ref 37.5–51.0)
Hemoglobin: 14.7 g/dL (ref 13.0–17.7)
Immature Grans (Abs): 0 10*3/uL (ref 0.0–0.1)
Immature Granulocytes: 0 %
Lymphocytes Absolute: 1.6 10*3/uL (ref 0.7–3.1)
Lymphs: 22 %
MCH: 29.6 pg (ref 26.6–33.0)
MCHC: 34.8 g/dL (ref 31.5–35.7)
MCV: 85 fL (ref 79–97)
Monocytes Absolute: 0.7 10*3/uL (ref 0.1–0.9)
Monocytes: 9 %
Neutrophils Absolute: 4.6 10*3/uL (ref 1.4–7.0)
Neutrophils: 64 %
Platelets: 133 10*3/uL — ABNORMAL LOW (ref 150–450)
RBC: 4.96 x10E6/uL (ref 4.14–5.80)
RDW: 13 % (ref 11.6–15.4)
WBC: 7.3 10*3/uL (ref 3.4–10.8)

## 2022-04-10 LAB — COMPREHENSIVE METABOLIC PANEL WITH GFR
ALT: 17 IU/L (ref 0–44)
AST: 19 IU/L (ref 0–40)
Albumin/Globulin Ratio: 2.1 (ref 1.2–2.2)
Albumin: 4.5 g/dL (ref 3.9–4.9)
Alkaline Phosphatase: 90 IU/L (ref 44–121)
BUN/Creatinine Ratio: 11 (ref 10–24)
BUN: 12 mg/dL (ref 8–27)
Bilirubin Total: 0.3 mg/dL (ref 0.0–1.2)
CO2: 25 mmol/L (ref 20–29)
Calcium: 9.5 mg/dL (ref 8.6–10.2)
Chloride: 104 mmol/L (ref 96–106)
Creatinine, Ser: 1.1 mg/dL (ref 0.76–1.27)
Globulin, Total: 2.1 g/dL (ref 1.5–4.5)
Glucose: 81 mg/dL (ref 70–99)
Potassium: 4.9 mmol/L (ref 3.5–5.2)
Sodium: 134 mmol/L (ref 134–144)
Total Protein: 6.6 g/dL (ref 6.0–8.5)
eGFR: 75 mL/min/1.73

## 2022-04-10 LAB — CARBAMAZEPINE LEVEL, TOTAL: Carbamazepine (Tegretol), S: 6.2 ug/mL (ref 4.0–12.0)

## 2022-04-10 MED ORDER — CARBAMAZEPINE ER 100 MG PO CP12: 100.0000 mg | ORAL_CAPSULE | Freq: Two times a day (BID) | ORAL | 11 refills | Status: DC

## 2022-04-10 NOTE — Patient Instructions (Signed)
I had a long discussion with the patient with regards to his neuropathic pain which is still bothersome but despite the high dose of gabapentin 800 mg 4 times daily and recent addition of carbamazepine 100 mg 3 times daily.  He is not sure if the carbamazepine is helping and is reluctant to try higher dose hence recommend tapering it gradually over the next few months to see if it was really helping and if his neuropathy pain worsens he is willing to stay on it and try higher dose.  Recommend he reduce carbamazepine dose to 100 mg twice daily for a month and then reduce further if there is no increase in his neuropathic pain.  He will stay on gabapentin 800 mg 4 times daily and return for follow-up in 3 months to see Philip Hernandez, nurse practitioner call earlier if necessary.

## 2022-04-10 NOTE — Progress Notes (Signed)
PATIENT: Philip Hernandez DOB: 1955/12/24   [facility] provider: Dr. Leonie Hernandez REASON FOR VISIT: Neuropathy HISTORY FROM: patient    Chief complaint: Chief Complaint  Patient presents with   Follow-up    RM 18, alone. C/o neuropathy. Feels stable. Not sure if carbamazepine is helpful-not sure if he needs it. Had a rash late March/early April.       HISTORY OF PRESENT ILLNESS: Update 04/10/2022 : He returns for follow-up after last visit with Philip Hernandez in 6 months ago.  Patient had complained of persistent pain in the legs and cramping particularly at night so carbamazepine was added.  He is currently taking 100 mg 3 times daily and is not sure if it is really helping him.  However he admits that at night he does not have to get up so often and is sleeping better.  Towards the end of March she developed itchy painful rash in both upper extremities over the forearms left more than right.  He was not sure if this was related to exposure to sun or from carbamazepine.  He applied lotion for few days without any benefit but then the rash went away by itself.  He was seen by dermatologist who gave him a lotion but patient did not apply it.  Patient is still not entirely convinced that this was not related to the carbamazepine.  He is tolerating the current dose of 100 mg 3 times daily quite well without any obvious side effects.  He had lab work done yesterday which showed carbamazepine levels to be normal at 6.2.  CBC and CMP were unremarkable with serum sodium being on the low side 134.  Patient remains on gabapentin 804 times daily which is tolerating well without side effects.  He has no new complaints today. Update 10/03/2021 Philip Hernandez: Returns for 67-monthneuropathy follow-up.  Remains on gabapentin 800 mg 4x daily. Does report consistent pain during the day which has been stable but now worse at night with increased pain and cramping. Also reports increased LUE cramping (from elbow to finger) and  continued occasional b/l hand cramping.  No further concerns at this time.    History provided for reference purposes only Update 03/22/2021 Philip Hernandez: Mr. SPeakreturns for 617-montheuropathy follow-up.  Reports neuropathy has been stable without worsening.  Continues gabapentin '800mg'$  4 times daily tolerating without side effects.  He does continue to experience pain daily which can interfere with daily activity and functioning but does notice worsening if he misses gabapentin dosage.  He tries to stay active as tolerated.  He does have occasional bilateral foot cramping which may last 15 to 30 seconds and then subside -this is been an ongoing symptom and not worsening.  No new concerns at this time.  Update 09/20/2020 Philip Hernandez: Philip Hernandez for 6-4-monthllow-up regarding neuropathy.  Remains on gabapentin 800 mg 4 times daily tolerating well without side effects.  Neuropathy has been stable and gabapentin continues to provide benefit.  He will have occasional difficulty remembering to take all 4 doses and will experience worsening symptoms if he misses a dose.  He has been keeping active with routine activity and exercise in fact neuropathy symptoms can worsen after sitting for prolonged period of time. He will have occasional difficulty holding or picking up objects but relatively able to maintain ADLs and IADLs independently.  He does have occasional balance difficulties but overall stable.  Update 03/15/2020 Philip Hernandez:, Philip Hernandez for follow-up for neuropathy with paresthesias.  He has been stable since prior visit 6 months ago with unchanged symptoms of numbness/tingling and paresthesias in bilateral lower extremities.  He continues to experience good days and bad days.  Continues on gabapentin 800 mg 4 times daily tolerating dosage well. At times, may forget to take day time dosage and will notice worsening in symptoms.  He has not trialed additional dose of gabapentin with worsening symptoms as previously  discussed with Philip Hernandez. No further concerns at this time.  Update 09/15/2019 Philip Hernandez : He returns for follow-up after last virtual video visit on 03/02/2019.  He continues to have tingling numbness and paresthesias in his feet which are more or less unchanged.  He has good days and bad days.  Physical activity does increase it.  Patient was started on Topamax previously and I will increase the dose to 50 mg twice daily but he had trouble tolerating this and stated that he could not focus with his eyes and had trouble reading and had to rub his eyes.  After taking it for 4 to 6 weeks he discontinued it.  He did note that increasing the dose of Topamax did help to some degree.  He remains on gabapentin and the current dose of 800 mg 4 times daily.  He has not tried Lyrica or Tegretol yet but does not want to try new medication.  Patient was diagnosed with Covid infection 3 weeks ago and did self quarantine himself and now has started going out last few days.  He is currently on prednisone taper as he also has sarcoidosis.  He does complain of feeling tired and having a minimum cough but is otherwise doing well.  His paresthesias are constant but is able to walk well he will have no problems with falls or balance issues.  Update via virtual visit 03/02/2019 Philip Hernandez: Philip Hernandez is seen today for virtual video office follow-up visit following last visit with Philip Hernandez on 08/25/2018.  He states he is doing about the same.  He still has paresthesias in his feet off and on.  He has good days and bad days.  He remains on Topamax 25 mg twice daily which is tolerating well but he is states if not sure if it is doing anything.  He also remains on gabapentin 800 mg 4 times daily and he does not miss a dose.  He continues to have gait and balance difficulties but is able to catch himself and has not fallen or hurt himself.  He does not use a cane or walker.  He recently got handicap parking sticker  renewed by me.  He has no new neurological complaints.  Update 08/25/2018 Philip Hernandez: Philip Hernandez is a 66 year old male with a history of paresthesias in the feet.  He returns today for follow-up.  He remains on gabapentin 800 mg 4 times a day.  He states that there are times that he will miss doses and his pain worsens.  Patient was also advised to start taking Topamax consistently.  He states that he is taking it more than he was but not necessarily consistently.  He states that he always has burning and tingling in the feet although most the time it is tolerable during the day unless he misses a dose of gabapentin.  He denies any significant changes with his gait or balance.  He does state that he is off balance at times.  Fortunately he has not had any recent falls.  He returns today  for evaluation.   HISTORY 02/03/18: Mr. Beavers is a 66 year old male with a history of paresthesias in the feet.  He returns today for follow-up.  He is currently on gabapentin 800 mg 4 times a day.  He reports that this work well most the time.  He states recently he has noticed  more pain in the legs.  He states that he does not take Topamax consistently.  Reports that he went to [place] recently and he did take Topamax daily and he felt that his pain was under better control.  Denies any significant changes with his gait or balance.  Denies any falls.  He returns today for evaluation.   REVIEW OF SYSTEMS: Out of a complete 14 system review of symptoms, the patient complains only of the following symptoms, and all other reviewed systems are negative.  See HPI  ALLERGIES: Allergies  Allergen Reactions   Corn-Containing Products     HOME MEDICATIONS: Outpatient Medications Prior to Visit  Medication Sig Dispense Refill   acetaminophen (TYLENOL) 500 MG tablet Take 500 mg by mouth every 6 (six) hours as needed.     cetirizine (ZYRTEC) 10 MG tablet Take 10 mg by mouth daily.     cholecalciferol (VITAMIN D) 1000 UNITS tablet  Take 1,000 Units by mouth daily.     gabapentin (NEURONTIN) 800 MG tablet TAKE 1 TABLET BY MOUTH 4  TIMES DAILY 360 tablet 3   hyoscyamine (LEVSIN) 0.125 MG tablet Take 1-2 tablets by mouth at onset of abdominal pain 20 tablet 0   Multiple Vitamins-Minerals (AIRBORNE PO) Take by mouth daily.     omeprazole (PRILOSEC) 40 MG capsule TAKE ONE CAPSULE BY MOUTH TWICE A DAY 180 capsule 3   rosuvastatin (CRESTOR) 10 MG tablet Take 1 tablet (10 mg total) by mouth daily. 90 tablet 3   carbamazepine (CARBATROL) 100 MG 12 hr capsule Take 1 capsule (100 mg total) by mouth 3 (three) times daily. 90 capsule 11   No facility-administered medications prior to visit.    PAST MEDICAL HISTORY: Past Medical History:  Diagnosis Date   Allergy    Arthritis    Cataract    Colon polyp    Colon polyps 2015   Cough    chronic sinus issues, GERD with poor dietary control and Pulmonary Sarcoid.    GERD (gastroesophageal reflux disease) Jan 2012   [name]. Bx proven.  Deve abd pain in Jan 2012 following high dose steroids for sarcoid. s/p EGD and improvement in symptoms following PPI   Hypogonadism male    Kidney stone    Malignant neoplasm of prostate [facility]) dec 2010   [name]. s/p surgery dec 2010, hx of rnormal PSA  since then   Other and unspecified mycoses    Painful respiration    Peripheral neuropathy    Prostate cancer ([place])    reoccurence (getting radiation 01-14-17 start).   Sarcoidosis dec 2011   stage 2 pulmonary, neck nodes, splenic granuloma   Skin cancer    Swelling, mass, or lump in head and neck     PAST SURGICAL HISTORY: Past Surgical History:  Procedure Laterality Date   HERNIA REPAIR  13 yrs ago   [place] BIOPSY  05/2010, 07/2010   nasal polyps  1984   NASAL SINUS SURGERY  2013   PROSTATE SURGERY  2010   radiation 2018   SHOULDER SURGERY Right 2009, 2010   x 2   SKIN CANCER  EXCISION  11/16/14    FAMILY HISTORY: Family History  Problem Relation  Age of Onset   Colon cancer Mother 35   Skin cancer Mother    Cancer Mother        colon   Colon cancer Brother 65   Skin cancer Brother    Cancer Brother        colon   Colon cancer Maternal Aunt        dx in her 99s   Cancer Maternal Aunt        "cancer of male organs" dx in her 54s   Breast cancer Maternal Aunt 66       bilateral, dx again at 41   Colon cancer Maternal Aunt 35   Stomach cancer Maternal Aunt 68   Dementia Father    Stomach cancer Maternal Uncle 47   Skin cancer Paternal Aunt    Prostate cancer Paternal Uncle 69   Cancer Paternal Uncle        prostate   Stomach cancer Paternal Grandmother        dx in her 38s   Stroke Paternal Grandfather    Colon cancer Maternal Aunt 68   Prostate cancer Paternal Uncle    Cancer Paternal Uncle        prostate   Prostate cancer Paternal Uncle    Kidney cancer Paternal Uncle    Lung cancer Paternal Uncle    Cancer Paternal Uncle        prostate   Lung cancer Paternal Uncle    Cancer Paternal Uncle        unknown   Cancer Paternal Uncle        prostate   Cancer Cousin        paternal cousin with cancer NOS   Cancer Cousin        paternal cousin with cancer NOS   Esophageal cancer Neg Hx    Rectal cancer Neg Hx     SOCIAL HISTORY: Social History   Socioeconomic History   Marital status: Married    Spouse name: Not on file   Number of children: 2   Years of education: Not on file   Highest education level: Not on file  Occupational History   Occupation: unemployed  Tobacco Use   Smoking status: Never   Smokeless tobacco: Never  Vaping Use   Vaping Use: Never used  Substance and Sexual Activity   Alcohol use: No   Drug use: No   Sexual activity: Yes  Other Topics Concern   Not on file  Social History Narrative   Married   2 children   Social Determinants of Health   Financial Resource Strain: Not on file  Food Insecurity: Not on file  Transportation Needs: Not on file  Physical Activity:  Not on file  Stress: Not on file  Social Connections: Not on file  Intimate Partner Violence: Not on file      PHYSICAL EXAM  Vitals:   04/10/22 1301  BP: 121/88  Pulse: 68  Weight: 183 lb (83 kg)  Height: '5\' 11"'$  (1.803 m)     Body mass index is 25.52 kg/m.  Generalized: Pleasant middle-aged Caucasian male, in no acute distress   . Afebrile. Head is nontraumatic. Neck is supple without bruit.    Cardiac exam no murmur or gallop. Lungs are clear to auscultation. Distal pulses are well felt.  Neurological examination  Mentation: Alert oriented to time, place, history taking. Follows all commands speech  and language fluent Cranial nerve II-XII: Pupils were equal round reactive to light. Extraocular movements were full, visual field were full on confrontational test. Facial sensation and strength were normal. Uvula tongue midline. Head turning and shoulder shrug  were normal and symmetric. Motor: The motor testing reveals 5 over 5 strength of all 4 extremities. Good symmetric motor tone is noted throughout.  Sensory: Sensory testing decreased vibratory sensation distal bilateral lower extremities.. No evidence of extinction is noted.  Slight hyperesthesia over toes bilaterally. Decreased sensation bilateral hands distally to pin prick, vibration and temperature.  Subjective numbness BLE stocking distribution Coordination: Cerebellar testing reveals good finger-nose-finger and heel-to-shin bilaterally.  Gait and station: Gait is normal. Tandem gait is quite unsteady.  Romberg is negative. No drift is seen.  Reflexes: Deep tendon reflexes are symmetric and normal bilaterally.     DIAGNOSTIC DATA (LABS, IMAGING, TESTING) - I reviewed patient records, labs, notes, testing and imaging myself where available.  Lab Results  Component Value Date   WBC 7.3 04/09/2022   HGB 14.7 04/09/2022   HCT 42.2 04/09/2022   MCV 85 04/09/2022   PLT 133 (L) 04/09/2022      Component Value  Date/Time   NA 134 04/09/2022 1312   K 4.9 04/09/2022 1312   CL 104 04/09/2022 1312   CO2 25 04/09/2022 1312   GLUCOSE 81 04/09/2022 1312   GLUCOSE 80 03/26/2013 1220   BUN 12 04/09/2022 1312   CREATININE 1.10 04/09/2022 1312   CREATININE 1.15 03/26/2013 1220   CALCIUM 9.5 04/09/2022 1312   PROT 6.6 04/09/2022 1312   ALBUMIN 4.5 04/09/2022 1312   AST 19 04/09/2022 1312   ALT 17 04/09/2022 1312   ALKPHOS 90 04/09/2022 1312   BILITOT 0.3 04/09/2022 1312   GFRNONAA 71 06/07/2020 1421   GFRNONAA 71 03/26/2013 1220   GFRAA 82 06/07/2020 1421   GFRAA 82 03/26/2013 1220   Lab Results  Component Value Date   CHOL 142 12/08/2021   HDL 41 12/08/2021   LDLCALC 80 12/08/2021   TRIG 113 12/08/2021   CHOLHDL 3.5 12/08/2021   No results found for: "HGBA1C" No results found for: "VITAMINB12" Lab Results  Component Value Date   TSH 2.580 12/08/2021    01/08/13 NCV/EMG Nerve conduction studies done on both lower extremities and the left upper extremity shows evidence of primarily motor involvement of the nerves in the legs, normal on the left arm. EMG evaluation of the left lower extremity shows mild primarily distal acute and chronic denervation, consistent with a peripheral neuropathy. There is no evidence of nerve root involvement. EMG evaluation shows evidence of a mononeuropathy affecting the median nerve proximally, again without evidence of a cervical radiculopathy. In the appropriate clinical setting, the study could be consistent with Guillian-Barre syndrome, but some chronic features of denervation also seen. Sarcoidosis may be another consideration, but no sensory involvement is noted.      ASSESSMENT AND PLAN 66 y.o. year old male  has a past medical history of Allergy, Arthritis, Cataract, Colon polyp, Colon polyps (2015), Cough, GERD (gastroesophageal reflux disease) (Jan 2012), Hypogonadism male, Kidney stone, Malignant neoplasm of prostate ([place]) (dec 2010), Other and  unspecified mycoses, Painful respiration, Peripheral neuropathy, Prostate cancer ([place]), Sarcoidosis (dec 2011), Skin cancer, and Swelling, mass, or lump in head and neck. here with : Persistent lower extremity paresthesias from small fiber neuropathy likely related to sarcoid.   I had a long discussion with the patient with regards to his neuropathic pain which  is still bothersome but despite the high dose of gabapentin 800 mg 4 times daily and recent addition of carbamazepine 100 mg 3 times daily.  He is not sure if the carbamazepine is helping and is reluctant to try higher dose hence recommend tapering it gradually over the next few months to see if it was really helping and if his neuropathy pain worsens he is willing to stay on it and try higher dose.  Recommend he reduce carbamazepine dose to 100 mg twice daily for a month and then reduce further if there is no increase in his neuropathic pain.  He will stay on gabapentin 800 mg 4 times daily and return for follow-up in 3 months to see Janett Billow, nurse Hernandez call earlier if necessary.  I spent 35 minutes of face-to-face and non-face-to-face time with patient.  This included previsit chart review, lab review, study review, order entry, electronic health record documentation, patient education and discussion regarding above diagnosis with progression and further information as above and answered all other questions to patient satisfaction   Antony Contras, MD  [facility] [address] [place],  [address]  Phone [phone] Fax [phone] Note: This document was prepared with digital dictation and possible smart phrase technology. Any transcriptional errors that result from this process are unintentional.

## 2022-05-30 ENCOUNTER — Encounter (HOSPITAL_BASED_OUTPATIENT_CLINIC_OR_DEPARTMENT_OTHER): Payer: Self-pay | Admitting: Surgery

## 2022-05-30 DIAGNOSIS — L578 Other skin changes due to chronic exposure to nonionizing radiation: Secondary | ICD-10-CM | POA: Diagnosis not present

## 2022-05-30 DIAGNOSIS — L814 Other melanin hyperpigmentation: Secondary | ICD-10-CM | POA: Diagnosis not present

## 2022-05-30 DIAGNOSIS — L821 Other seborrheic keratosis: Secondary | ICD-10-CM | POA: Diagnosis not present

## 2022-05-30 DIAGNOSIS — L298 Other pruritus: Secondary | ICD-10-CM | POA: Diagnosis not present

## 2022-05-30 DIAGNOSIS — Z85828 Personal history of other malignant neoplasm of skin: Secondary | ICD-10-CM | POA: Diagnosis not present

## 2022-05-30 DIAGNOSIS — D225 Melanocytic nevi of trunk: Secondary | ICD-10-CM | POA: Diagnosis not present

## 2022-05-30 DIAGNOSIS — Z08 Encounter for follow-up examination after completed treatment for malignant neoplasm: Secondary | ICD-10-CM | POA: Diagnosis not present

## 2022-05-30 DIAGNOSIS — L918 Other hypertrophic disorders of the skin: Secondary | ICD-10-CM | POA: Diagnosis not present

## 2022-05-30 NOTE — Progress Notes (Signed)

## 2022-06-05 ENCOUNTER — Telehealth: Payer: Self-pay | Admitting: Neurology

## 2022-06-05 NOTE — Telephone Encounter (Signed)
Pt called wanting to inform the provider that today is his last day for him to take the once a day carbamazepine (CARBATROL) 100 MG 12 hr capsule for a month. He is done taking the  medication.

## 2022-06-07 ENCOUNTER — Encounter: Payer: Self-pay | Admitting: Gastroenterology

## 2022-06-08 ENCOUNTER — Telehealth: Payer: Self-pay | Admitting: Family Medicine

## 2022-06-08 ENCOUNTER — Other Ambulatory Visit: Payer: BC Managed Care – PPO

## 2022-06-08 DIAGNOSIS — E782 Mixed hyperlipidemia: Secondary | ICD-10-CM | POA: Diagnosis not present

## 2022-06-08 DIAGNOSIS — E785 Hyperlipidemia, unspecified: Secondary | ICD-10-CM | POA: Diagnosis not present

## 2022-06-08 MED ORDER — SODIUM CHLORIDE 0.9 % IV SOLN
1.0000 g | INTRAVENOUS | Status: AC
  Administered 2022-06-11: 1 g via INTRAVENOUS
  Filled 2022-06-08 (×2): qty 1

## 2022-06-08 NOTE — Telephone Encounter (Signed)
Patient would like to come in to get labs done today for his appt on 9/29

## 2022-06-09 LAB — CBC WITH DIFFERENTIAL/PLATELET
Basophils Absolute: 0.1 10*3/uL (ref 0.0–0.2)
Basos: 1 %
EOS (ABSOLUTE): 0.2 10*3/uL (ref 0.0–0.4)
Eos: 3 %
Hematocrit: 41.2 % (ref 37.5–51.0)
Hemoglobin: 14.7 g/dL (ref 13.0–17.7)
Immature Grans (Abs): 0 10*3/uL (ref 0.0–0.1)
Immature Granulocytes: 0 %
Lymphocytes Absolute: 1.8 10*3/uL (ref 0.7–3.1)
Lymphs: 27 %
MCH: 30.3 pg (ref 26.6–33.0)
MCHC: 35.7 g/dL (ref 31.5–35.7)
MCV: 85 fL (ref 79–97)
Monocytes Absolute: 0.6 10*3/uL (ref 0.1–0.9)
Monocytes: 9 %
Neutrophils Absolute: 4.1 10*3/uL (ref 1.4–7.0)
Neutrophils: 60 %
Platelets: 150 10*3/uL (ref 150–450)
RBC: 4.85 x10E6/uL (ref 4.14–5.80)
RDW: 13.1 % (ref 11.6–15.4)
WBC: 6.9 10*3/uL (ref 3.4–10.8)

## 2022-06-09 LAB — TSH: TSH: 2.23 u[IU]/mL (ref 0.450–4.500)

## 2022-06-09 LAB — CMP14+EGFR
ALT: 24 IU/L (ref 0–44)
AST: 24 IU/L (ref 0–40)
Albumin/Globulin Ratio: 2.2 (ref 1.2–2.2)
Albumin: 4.6 g/dL (ref 3.9–4.9)
Alkaline Phosphatase: 93 IU/L (ref 44–121)
BUN/Creatinine Ratio: 11 (ref 10–24)
BUN: 12 mg/dL (ref 8–27)
Bilirubin Total: 0.5 mg/dL (ref 0.0–1.2)
CO2: 25 mmol/L (ref 20–29)
Calcium: 9.4 mg/dL (ref 8.6–10.2)
Chloride: 104 mmol/L (ref 96–106)
Creatinine, Ser: 1.14 mg/dL (ref 0.76–1.27)
Globulin, Total: 2.1 g/dL (ref 1.5–4.5)
Glucose: 86 mg/dL (ref 70–99)
Potassium: 5.7 mmol/L — ABNORMAL HIGH (ref 3.5–5.2)
Sodium: 142 mmol/L (ref 134–144)
Total Protein: 6.7 g/dL (ref 6.0–8.5)
eGFR: 71 mL/min/{1.73_m2} (ref >59)

## 2022-06-09 LAB — LIPID PANEL
Chol/HDL Ratio: 3.7 ratio (ref 0.0–5.0)
Cholesterol, Total: 141 mg/dL (ref 100–199)
HDL: 38 mg/dL — ABNORMAL LOW (ref >39)
LDL Chol Calc (NIH): 78 mg/dL (ref 0–99)
Triglycerides: 143 mg/dL (ref 0–149)
VLDL Cholesterol Cal: 25 mg/dL (ref 5–40)

## 2022-06-09 NOTE — H&P (Signed)
REFERRING PHYSICIAN: Manus Gunning*  PROVIDER: Beverlee Nims, MD  MRN: [id number] DOB: October 11, 1957Subjective   Chief Complaint: New Patient (Right inguinal hernia )   History of Present Illness: Philip Hernandez is a 66 y.o. male who is seenas an office consultation for evaluation of New Patient (Right inguinal hernia ) .   This is a 66 year old gentleman referred here for evaluation of a symptomatic right inguinal hernia. He has been having lower abdominal pain mostly on the right side. He ended up having a CT scan of his abdomen pelvis showing a right inguinal hernia containing bowel as well as the appendix. The appendix was normal. He has had no nausea, vomiting, or obstructive symptoms. The pain can be sharp and last several hours and then go away for several days. He has no other complaints. He had a previous laparoscopic left inguinal hernia repair with mesh in 1998 and has had prostate surgery through midline incision  Review of Systems: A complete review of systems was obtained from the patient. I have reviewed this information and discussed as appropriate with the patient. See HPI as well for other ROS.  ROS   Medical History: Past Medical History:  Diagnosis Date  GERD (gastroesophageal reflux disease)  History of cancer   There is no problem list on file for this patient.  Past Surgical History:  Procedure Laterality Date  HERNIA REPAIR  PROSTATE SURGERY    No Known Allergies  Current Outpatient Medications on File Prior to Visit  Medication Sig Dispense Refill  carBAMazepine (CARBATROL) 100 MG CR capsule Take 100 mg by mouth 3 (three) times daily  gabapentin (NEURONTIN) 800 MG tablet  omeprazole (PRILOSEC) 40 MG DR capsule  rosuvastatin (CRESTOR) 10 MG tablet Take 1 tablet by mouth once daily  acetaminophen (TYLENOL) 500 MG tablet Take 500 mg by mouth every 6 (six) hours as needed  cetirizine (ZYRTEC) 10 mg capsule   No current  facility-administered medications on file prior to visit.   Family History  Problem Relation Age of Onset  Colon cancer Mother  Skin cancer Brother  Hyperlipidemia (Elevated cholesterol) Brother  Colon cancer Brother    Social History   Tobacco Use  Smoking Status Never  Smokeless Tobacco Never    Social History   Socioeconomic History  Marital status: Married  Tobacco Use  Smoking status: Never  Smokeless tobacco: Never  Substance and Sexual Activity  Alcohol use: Never  Drug use: Never   Objective:   Vitals:   BP: 130/78  Pulse: 66  Temp: 36.7 C (98 F)  SpO2: 98%  Weight: 83.7 kg (184 lb 9.6 oz)  Height: 180.3 cm ('5\' 11"'$ )   Body mass index is 25.75 kg/m.  Physical Exam   He appears well on exam  He has a well-healed midline abdominal incision  He has an umbilical hernia with a 1 cm fascial defect.  There is an easily reducible right inguinal hernia  There is no evidence of recurrent inguinal hernia the left side  Labs, Imaging and Diagnostic Testing: I reviewed the CAT scan of his abdomen and pelvis  Assessment and Plan:   Diagnoses and all orders for this visit:  Right inguinal hernia  Umbilical hernia without obstruction and without gangrene    At this point, I discussed abdominal wall anatomy and hernias with the patient and his wife. I recommend an open right inguinal hernia repair with mesh as he has had a previous laparoscopic repair. I would also repair the small umbilical  hernia with a 1 cm fascial defect likely with mesh as well. I would recommend repair as he is symptomatic from his hernia in the groin. We discussed the surgical procedure in detail. We discussed the risk of surgery which includes but is not limited to bleeding, infection, injury to surrounding structures, nerve entrapment, chronic pain, use of mesh, hernia recurrence, etc. He understands and wishes to proceed with surgery which will be scheduled.

## 2022-06-11 ENCOUNTER — Ambulatory Visit (HOSPITAL_BASED_OUTPATIENT_CLINIC_OR_DEPARTMENT_OTHER)
Admission: RE | Admit: 2022-06-11 | Discharge: 2022-06-11 | Disposition: A | Discharge status: Home / Self Care | Payer: BC Managed Care – PPO | Attending: Surgery | Admitting: Surgery

## 2022-06-11 ENCOUNTER — Ambulatory Visit (HOSPITAL_BASED_OUTPATIENT_CLINIC_OR_DEPARTMENT_OTHER): Payer: BC Managed Care – PPO | Admitting: Certified Registered"

## 2022-06-11 ENCOUNTER — Encounter (HOSPITAL_BASED_OUTPATIENT_CLINIC_OR_DEPARTMENT_OTHER): Payer: Self-pay | Admitting: Surgery

## 2022-06-11 ENCOUNTER — Other Ambulatory Visit: Payer: Self-pay

## 2022-06-11 ENCOUNTER — Encounter (HOSPITAL_BASED_OUTPATIENT_CLINIC_OR_DEPARTMENT_OTHER)
Admission: RE | Disposition: A | Discharge status: Home / Self Care | Payer: Self-pay | Source: Home / Self Care | Attending: Surgery

## 2022-06-11 DIAGNOSIS — K429 Umbilical hernia without obstruction or gangrene: Secondary | ICD-10-CM | POA: Diagnosis not present

## 2022-06-11 DIAGNOSIS — Z01818 Encounter for other preprocedural examination: Secondary | ICD-10-CM

## 2022-06-11 DIAGNOSIS — D869 Sarcoidosis, unspecified: Secondary | ICD-10-CM | POA: Insufficient documentation

## 2022-06-11 DIAGNOSIS — K219 Gastro-esophageal reflux disease without esophagitis: Secondary | ICD-10-CM | POA: Insufficient documentation

## 2022-06-11 DIAGNOSIS — K4090 Unilateral inguinal hernia, without obstruction or gangrene, not specified as recurrent: Secondary | ICD-10-CM | POA: Diagnosis not present

## 2022-06-11 DIAGNOSIS — G8918 Other acute postprocedural pain: Secondary | ICD-10-CM | POA: Diagnosis not present

## 2022-06-11 HISTORY — PX: UMBILICAL HERNIA REPAIR: SHX196

## 2022-06-11 HISTORY — PX: INGUINAL HERNIA REPAIR: SHX194

## 2022-06-11 SURGERY — REPAIR, HERNIA, INGUINAL, ADULT
Anesthesia: General | Site: Groin | Laterality: Right

## 2022-06-11 MED ORDER — KETOROLAC TROMETHAMINE 30 MG/ML IJ SOLN
INTRAMUSCULAR | Status: AC
  Filled 2022-06-11: qty 1

## 2022-06-11 MED ORDER — KETOROLAC TROMETHAMINE 30 MG/ML IJ SOLN
30.0000 mg | Freq: Once | INTRAMUSCULAR | Status: AC | PRN
  Administered 2022-06-11: 30 mg via INTRAVENOUS

## 2022-06-11 MED ORDER — ENSURE PRE-SURGERY PO LIQD: 296.0000 mL | Freq: Once | ORAL | Status: DC

## 2022-06-11 MED ORDER — FENTANYL CITRATE (PF) 100 MCG/2ML IJ SOLN
INTRAMUSCULAR | Status: AC
  Filled 2022-06-11: qty 2

## 2022-06-11 MED ORDER — ONDANSETRON HCL 4 MG/2ML IJ SOLN
INTRAMUSCULAR | Status: AC
  Filled 2022-06-11: qty 2

## 2022-06-11 MED ORDER — ROPIVACAINE HCL 5 MG/ML IJ SOLN
INTRAMUSCULAR | Status: DC | PRN
  Administered 2022-06-11: 30 mL

## 2022-06-11 MED ORDER — PROPOFOL 10 MG/ML IV BOLUS
INTRAVENOUS | Status: DC | PRN
  Administered 2022-06-11: 150 mg via INTRAVENOUS

## 2022-06-11 MED ORDER — FENTANYL CITRATE (PF) 100 MCG/2ML IJ SOLN
25.0000 ug | INTRAMUSCULAR | Status: DC | PRN
  Administered 2022-06-11 (×2): 50 ug via INTRAVENOUS

## 2022-06-11 MED ORDER — OXYCODONE HCL 5 MG PO TABS
5.0000 mg | ORAL_TABLET | Freq: Once | ORAL | Status: AC | PRN
  Administered 2022-06-11: 5 mg via ORAL

## 2022-06-11 MED ORDER — LIDOCAINE HCL (CARDIAC) PF 100 MG/5ML IV SOSY
PREFILLED_SYRINGE | INTRAVENOUS | Status: DC | PRN
  Administered 2022-06-11: 100 mg via INTRAVENOUS

## 2022-06-11 MED ORDER — LACTATED RINGERS IV SOLN: INTRAVENOUS | Status: DC

## 2022-06-11 MED ORDER — MIDAZOLAM HCL 2 MG/2ML IJ SOLN
INTRAMUSCULAR | Status: AC
  Filled 2022-06-11: qty 2

## 2022-06-11 MED ORDER — ERTAPENEM SODIUM 1 G IJ SOLR
INTRAMUSCULAR | Status: AC
  Filled 2022-06-11: qty 1

## 2022-06-11 MED ORDER — PROPOFOL 10 MG/ML IV BOLUS
INTRAVENOUS | Status: AC
  Filled 2022-06-11: qty 20

## 2022-06-11 MED ORDER — ONDANSETRON HCL 4 MG/2ML IJ SOLN
INTRAMUSCULAR | Status: DC | PRN
  Administered 2022-06-11: 4 mg via INTRAVENOUS

## 2022-06-11 MED ORDER — DEXAMETHASONE SODIUM PHOSPHATE 10 MG/ML IJ SOLN
INTRAMUSCULAR | Status: AC
  Filled 2022-06-11: qty 1

## 2022-06-11 MED ORDER — BUPIVACAINE-EPINEPHRINE 0.5% -1:200000 IJ SOLN
INTRAMUSCULAR | Status: DC | PRN
  Administered 2022-06-11: 20 mL

## 2022-06-11 MED ORDER — LIDOCAINE 2% (20 MG/ML) 5 ML SYRINGE
INTRAMUSCULAR | Status: AC
  Filled 2022-06-11: qty 5

## 2022-06-11 MED ORDER — CHLORHEXIDINE GLUCONATE CLOTH 2 % EX PADS: 6.0000 | MEDICATED_PAD | Freq: Once | CUTANEOUS | Status: DC

## 2022-06-11 MED ORDER — OXYCODONE HCL 5 MG PO TABS
ORAL_TABLET | ORAL | Status: AC
  Filled 2022-06-11: qty 1

## 2022-06-11 MED ORDER — FENTANYL CITRATE (PF) 100 MCG/2ML IJ SOLN
50.0000 ug | Freq: Once | INTRAMUSCULAR | Status: AC
  Administered 2022-06-11: 50 ug via INTRAVENOUS

## 2022-06-11 MED ORDER — DEXAMETHASONE SODIUM PHOSPHATE 10 MG/ML IJ SOLN
INTRAMUSCULAR | Status: DC | PRN
  Administered 2022-06-11: 5 mg via INTRAVENOUS

## 2022-06-11 MED ORDER — OXYCODONE HCL 5 MG/5ML PO SOLN: 5.0000 mg | Freq: Once | ORAL | Status: AC | PRN

## 2022-06-11 MED ORDER — MIDAZOLAM HCL 2 MG/2ML IJ SOLN
1.0000 mg | Freq: Once | INTRAMUSCULAR | Status: AC
  Administered 2022-06-11: 1 mg via INTRAVENOUS

## 2022-06-11 MED ORDER — TRAMADOL HCL 50 MG PO TABS: 50.0000 mg | ORAL_TABLET | Freq: Four times a day (QID) | ORAL | 0 refills | Status: DC | PRN

## 2022-06-11 MED ORDER — ONDANSETRON HCL 4 MG/2ML IJ SOLN: 4.0000 mg | Freq: Once | INTRAMUSCULAR | Status: DC | PRN

## 2022-06-11 SURGICAL SUPPLY — 50 items
ADH SKN CLS APL DERMABOND .7 (GAUZE/BANDAGES/DRESSINGS) ×4
APL PRP STRL LF DISP 70% ISPRP (MISCELLANEOUS) ×2
BLADE CLIPPER SURG (BLADE) ×3 IMPLANT
BLADE SURG 15 STRL LF DISP TIS (BLADE) ×3 IMPLANT
BLADE SURG 15 STRL SS (BLADE) ×2
CANISTER SUCT 1200ML W/VALVE (MISCELLANEOUS) IMPLANT
CHLORAPREP W/TINT 26 (MISCELLANEOUS) ×3 IMPLANT
COVER BACK TABLE 60X90IN (DRAPES) ×3 IMPLANT
COVER MAYO STAND STRL (DRAPES) ×3 IMPLANT
DERMABOND ADVANCED .7 DNX12 (GAUZE/BANDAGES/DRESSINGS) ×6 IMPLANT
DRAIN PENROSE .5X12 LATEX STL (DRAIN) ×3 IMPLANT
DRAPE LAPAROTOMY 100X72 PEDS (DRAPES) ×3 IMPLANT
DRAPE UTILITY XL STRL (DRAPES) ×3 IMPLANT
DRSG TEGADERM 2-3/8X2-3/4 SM (GAUZE/BANDAGES/DRESSINGS) IMPLANT
ELECT REM PT RETURN 9FT ADLT (ELECTROSURGICAL) ×2
ELECTRODE REM PT RTRN 9FT ADLT (ELECTROSURGICAL) ×3 IMPLANT
GLOVE SURG SIGNA 7.5 PF LTX (GLOVE) ×3 IMPLANT
GOWN STRL REUS W/ TWL LRG LVL3 (GOWN DISPOSABLE) ×3 IMPLANT
GOWN STRL REUS W/ TWL XL LVL3 (GOWN DISPOSABLE) ×3 IMPLANT
GOWN STRL REUS W/TWL LRG LVL3 (GOWN DISPOSABLE) ×2
GOWN STRL REUS W/TWL XL LVL3 (GOWN DISPOSABLE) ×2
MESH PARIETEX PROGRIP RIGHT (Mesh General) IMPLANT
NDL HYPO 25X1 1.5 SAFETY (NEEDLE) ×3 IMPLANT
NEEDLE HYPO 25X1 1.5 SAFETY (NEEDLE) ×2 IMPLANT
NS IRRIG 1000ML POUR BTL (IV SOLUTION) IMPLANT
PACK BASIN DAY SURGERY FS (CUSTOM PROCEDURE TRAY) ×3 IMPLANT
PENCIL SMOKE EVACUATOR (MISCELLANEOUS) ×3 IMPLANT
SLEEVE SCD COMPRESS KNEE MED (STOCKING) ×3 IMPLANT
SPIKE FLUID TRANSFER (MISCELLANEOUS) IMPLANT
SPONGE INTESTINAL PEANUT (DISPOSABLE) IMPLANT
SPONGE T-LAP 4X18 ~~LOC~~+RFID (SPONGE) ×3 IMPLANT
SUT ETHIBOND 0 MO6 C/R (SUTURE) IMPLANT
SUT MNCRL AB 4-0 PS2 18 (SUTURE) ×3 IMPLANT
SUT NOVA 0 T19/GS 22DT (SUTURE) IMPLANT
SUT NOVA NAB DX-16 0-1 5-0 T12 (SUTURE) IMPLANT
SUT NOVA NAB GS-21 1 T12 (SUTURE) IMPLANT
SUT SILK 2 0 SH (SUTURE) IMPLANT
SUT VIC AB 2-0 CT1 27 (SUTURE) ×4
SUT VIC AB 2-0 CT1 TAPERPNT 27 (SUTURE) ×6 IMPLANT
SUT VIC AB 2-0 SH 27 (SUTURE)
SUT VIC AB 2-0 SH 27XBRD (SUTURE) IMPLANT
SUT VIC AB 3-0 CT1 27 (SUTURE) ×2
SUT VIC AB 3-0 CT1 27XBRD (SUTURE) ×3 IMPLANT
SUT VIC AB 3-0 SH 27 (SUTURE) ×2
SUT VIC AB 3-0 SH 27X BRD (SUTURE) ×3 IMPLANT
SYR BULB EAR ULCER 3OZ GRN STR (SYRINGE) IMPLANT
SYR CONTROL 10ML LL (SYRINGE) ×3 IMPLANT
TOWEL GREEN STERILE FF (TOWEL DISPOSABLE) ×3 IMPLANT
TUBE CONNECTING 20X1/4 (TUBING) IMPLANT
YANKAUER SUCT BULB TIP NO VENT (SUCTIONS) IMPLANT

## 2022-06-11 NOTE — Anesthesia Procedure Notes (Signed)
Procedure Name: LMA Insertion Date/Time: 06/11/2022 8:18 AM  Performed by: Lavonia Dana, CRNAPre-anesthesia Checklist: Patient identified, Emergency Drugs available, Suction available and Patient being monitored Patient Re-evaluated:Patient Re-evaluated prior to induction Oxygen Delivery Method: Circle system utilized Preoxygenation: Pre-oxygenation with 100% oxygen Induction Type: IV induction Ventilation: Mask ventilation without difficulty LMA: LMA inserted LMA Size: 5.0 Number of attempts: 1 Airway Equipment and Method: Bite block Placement Confirmation: positive ETCO2 Tube secured with: Tape Dental Injury: Teeth and Oropharynx as per pre-operative assessment

## 2022-06-11 NOTE — Interval H&P Note (Signed)
History and Physical Interval Note: no change in H and P  06/11/2022 7:11 AM  Philip Hernandez  has presented today for surgery, with the diagnosis of right inguinal hernia umbilical hernia.  The various methods of treatment have been discussed with the patient and family. After consideration of risks, benefits and other options for treatment, the patient has consented to  Procedure(s) with comments: OPEN RIGHT INGUINAL HERNIA REPAIR WITH MESH (N/A) - [place] (N/A) as a surgical intervention.  The patient's history has been reviewed, patient examined, no change in status, stable for surgery.  I have reviewed the patient's chart and labs.  Questions were answered to the patient's satisfaction.     Coralie Keens

## 2022-06-11 NOTE — Anesthesia Procedure Notes (Signed)
Anesthesia Procedure Image    

## 2022-06-11 NOTE — Anesthesia Procedure Notes (Signed)
Anesthesia Regional Block: TAP block   Pre-Anesthetic Checklist: , timeout performed,  Correct Patient, Correct Site, Correct Laterality,  Correct Procedure, Correct Position, site marked,  Risks and benefits discussed,  Surgical consent,  Pre-op evaluation,  At surgeon's request and post-op pain management  Laterality: Right  Prep: chloraprep       Needles:  Injection technique: Single-shot  Needle Type: Echogenic Needle     Needle Length: 9cm      Additional Needles:   Procedures:,,,, ultrasound used (permanent image in chart),,    Narrative:  Start time: 06/11/2022 7:40 AM End time: 06/11/2022 7:48 AM Injection made incrementally with aspirations every 5 mL.  Performed by: Personally  Anesthesiologist: Myrtie Soman, MD  Additional Notes: Patient tolerated the procedure well without complications

## 2022-06-11 NOTE — Discharge Instructions (Addendum)
[facility] _______[facility]  UMBILICAL OR INGUINAL HERNIA REPAIR: POST OP INSTRUCTIONS  Always review your discharge instruction sheet given to you by the facility where your surgery was performed. IF YOU HAVE DISABILITY OR FAMILY LEAVE FORMS, YOU MUST BRING THEM TO THE OFFICE FOR PROCESSING.   DO NOT GIVE THEM TO YOUR DOCTOR.  1. A  prescription for pain medication may be given to you upon discharge.  Take your pain medication as prescribed, if needed.  If narcotic pain medicine is not needed, then you may take acetaminophen (Tylenol) or ibuprofen (Advil) as needed. 2. Take your usually prescribed medications unless otherwise directed. If you need a refill on your pain medication, please contact your pharmacy.  They will contact our office to request authorization. Prescriptions will not be filled after 5 pm or on week-ends. 3. You should follow a light diet the first 24 hours after arrival home, such as soup and crackers, etc.  Be sure to include lots of fluids daily.  Resume your normal diet the day after surgery. 4.Most patients will experience some swelling and bruising around the umbilicus or in the groin and scrotum.  Ice packs and reclining will help.  Swelling and bruising can take several days to resolve.  6. It is common to experience some constipation if taking pain medication after surgery.  Increasing fluid intake and taking a stool softener (such as Colace) will usually help or prevent this problem from occurring.  A mild laxative (Milk of Magnesia or Miralax) should be taken according to package directions if there are no bowel movements after 48 hours. 7. Unless discharge instructions indicate otherwise, you may remove your bandages 24-48 hours after surgery, and you may shower at that time.  You may have steri-strips (small skin tapes) in place directly over the incision.  These strips should be left on the skin for 7-10 days.  If your surgeon used skin glue on the  incision, you may shower in 24 hours.  The glue will flake off over the next 2-3 weeks.  Any sutures or staples will be removed at the office during your follow-up visit. 8. ACTIVITIES:  You may resume regular (light) daily activities beginning the next day--such as daily self-care, walking, climbing stairs--gradually increasing activities as tolerated.  You may have sexual intercourse when it is comfortable.  Refrain from any heavy lifting or straining until approved by your doctor.  a.You may drive when you are no longer taking prescription pain medication, you can comfortably wear a seatbelt, and you can safely maneuver your car and apply brakes. b.RETURN TO WORK:   _____________________________________________  9.You should see your doctor in the office for a follow-up appointment approximately 2-3 weeks after your surgery.  Make sure that you call for this appointment within a day or two after you arrive home to insure a convenient appointment time. 10.OTHER INSTRUCTIONS: __YOU MAY SHOWER STARTING TOMORROW ICE PACK, TYLENOL, AND IBUPROFEN ALSO FOR PAIN NO LIFTING MORE THAN 15 POUNDS FOR 4 WEEKS _______________________    _____________________________________  WHEN TO CALL YOUR DOCTOR: Fever over 101.0 Inability to urinate Nausea and/or vomiting Extreme swelling or bruising Continued bleeding from incision. Increased pain, redness, or drainage from the incision  The clinic staff is available to answer your questions during regular business hours.  Please don't hesitate to call and ask to speak to one of the nurses for clinical concerns.  If you have a medical emergency, go to the nearest emergency room or call 911.  A surgeon from [facility] is always on call at the hospital   [address], [place], [place], [place]  [address] ?  [address], [place], [place]   [address] [phone] ? [phone] ? FAX [phone] Web site: [email or web address]   Post Anesthesia Home Care Instructions  Activity: Get plenty of rest for the remainder of the day. A responsible individual must stay with you for 24 hours following the procedure.  For the next 24 hours, DO NOT: -Drive a car -Paediatric nurse -Drink alcoholic beverages -Take any medication unless instructed by your physician -Make any legal decisions or sign important papers.  Meals: Start with liquid foods such as gelatin or soup. Progress to regular foods as tolerated. Avoid greasy, spicy, heavy foods. If nausea and/or vomiting occur, drink only clear liquids until the nausea and/or vomiting subsides. Call your physician if vomiting continues.  Special Instructions/Symptoms: Your throat may feel dry or sore from the anesthesia or the breathing tube placed in your throat during surgery. If this causes discomfort, gargle with warm salt water. The discomfort should disappear within 24 hours.  If you had a scopolamine patch placed behind your ear for the management of post- operative nausea and/or vomiting:  1. The medication in the patch is effective for 72 hours, after which it should be removed.  Wrap patch in a tissue and discard in the trash. Wash hands thoroughly with soap and water. 2. You may remove the patch earlier than 72 hours if you experience unpleasant side effects which may include dry mouth, dizziness or visual disturbances. 3. Avoid touching the patch. Wash your hands with soap and water after contact with the patch.     Post Anesthesia Home Care Instructions  Activity: Get plenty of rest for the remainder of the day. A responsible individual must stay with you for 24 hours following the procedure.  For the next 24 hours, DO NOT: -Drive a car -Paediatric nurse -Drink alcoholic beverages -Take any medication unless instructed by your physician -Make any legal decisions or sign important papers.  Meals: Start with liquid foods such as gelatin or soup. Progress to  regular foods as tolerated. Avoid greasy, spicy, heavy foods. If nausea and/or vomiting occur, drink only clear liquids until the nausea and/or vomiting subsides. Call your physician if vomiting continues.  Special Instructions/Symptoms: Your throat may feel dry or sore from the anesthesia or the breathing tube placed in your throat during surgery. If this causes discomfort, gargle with warm salt water. The discomfort should disappear within 24 hours.  If you had a scopolamine patch placed behind your ear for the management of post- operative nausea and/or vomiting:  1. The medication in the patch is effective for 72 hours, after which it should be removed.  Wrap patch in a tissue and discard in the trash. Wash hands thoroughly with soap and water. 2. You may remove the patch earlier than 72 hours if you experience unpleasant side effects which may include dry mouth, dizziness or visual disturbances. 3. Avoid touching the patch. Wash your hands with soap and water after contact with the patch.    P-Take any medication unless instructed by your physician -Make any legal decisions or sign important papers.  Meals: Start with liquid foods such as gelatin or soup. Progress to regular foods as tolerated. Avoid greasy, spicy, heavy foods. If nausea and/or vomiting occur, drink only clear liquids until the nausea and/or vomiting subsides. Call your physician if vomiting continues.  Special Instructions/Symptoms: Your throat may feel dry or sore from the anesthesia or the breathing tube placed in your throat during surgery. If this causes discomfort, gargle with warm salt water. The discomfort should disappear within 24 hours.  If you had a scopolamine patch placed behind your ear for the management of post- operative nausea and/or vomiting:  1. The medication in the patch is effective for 72 hours, after which it should be removed.  Wrap patch in a tissue and discard in the trash. Wash hands  thoroughly with soap and water. 2. You may remove the patch earlier than 72 hours if you experience unpleasant side effects which may include dry mouth, dizziness or visual disturbances. 3. Avoid touching the patch. Wash your hands with soap and water after contact with the patch.    Post Anesthesia Home Care Instructions  Activity: Get plenty of rest for the remainder of the day. A responsible individual must stay with you for 24 hours following the procedure.  For the next 24 hours, DO NOT: -Drive a car -Paediatric nurse -Drink alcoholic beverages -Take any medication unless instructed by your physician -Make any legal decisions or sign important papers.  Meals: Start with liquid foods such as gelatin or soup. Progress to regular foods as tolerated. Avoid greasy, spicy, heavy foods. If nausea and/or vomiting occur, drink only clear liquids until the nausea and/or vomiting subsides. Call your physician if vomiting continues.  Special Instructions/Symptoms: Your throat may feel dry or sore from the anesthesia or the breathing tube placed in your throat during surgery. If this causes discomfort, gargle with warm salt water. The discomfort should disappear within 24 hours.  If you had a scopolamine patch placed behind your ear for the management of post- operative nausea and/or vomiting:  1. The medication in the patch is effective for 72 hours, after which it should be removed.  Wrap patch in a tissue and discard in the trash. Wash hands thoroughly with soap and water. 2. You may remove the patch earlier than 72 hours if you experience unpleasant side effects which may include dry mouth, dizziness or visual disturbances. 3. Avoid touching the patch. Wash your hands with soap and water after contact with the patch.  Pain medication oxycodone '5mg'$  was given in discharge at 10:18AM.  Next pain medication due in six hours at 4pm.  No ibuprofen until after 3:30pm today if needed.

## 2022-06-11 NOTE — Progress Notes (Signed)
Assisted Dr. Rose with right, transabdominal plane, ultrasound guided block. Side rails up, monitors on throughout procedure. See vital signs in flow sheet. Tolerated Procedure well. 

## 2022-06-11 NOTE — Transfer of Care (Signed)
Immediate Anesthesia Transfer of Care Note  Patient: Philip Hernandez  Procedure(s) Performed: OPEN RIGHT INGUINAL HERNIA REPAIR WITH MESH (Right: Groin) HERNIA REPAIR UMBILICAL ADULT WITH MESH (Abdomen)  Patient Location: PACU  Anesthesia Type:GA combined with regional for post-op pain  Level of Consciousness: drowsy  Airway & Oxygen Therapy: Patient Spontanous Breathing and Patient connected to face mask oxygen  Post-op Assessment: Report given to RN and Post -op Vital signs reviewed and stable  Post vital signs: Reviewed and stable  Last Vitals:   BP: 156/74 (98) RR: 11 Vitals Value Taken Time  BP    Temp    Pulse 60 06/11/22 0911  Resp    SpO2 100 % 06/11/22 0911  Vitals shown include unvalidated device data.  Last Pain:  Vitals:   06/11/22 0706  TempSrc: Tympanic  PainSc: 0-No pain         Complications: No notable events documented.

## 2022-06-11 NOTE — Op Note (Signed)
Philip Hernandez 06/11/2022   Pre-op Diagnosis: right inguinal hernia umbilical hernia     Post-op Diagnosis: same  Procedure(s): OPEN RIGHT INGUINAL HERNIA REPAIR WITH MESH HERNIA REPAIR UMBILICAL  (7 mm fascial defect)  Surgeon(s): Coralie Keens, MD  Anesthesia: General  Staff:  Circulator: Izora Ribas, RN; Anson Crofts, RN Scrub Person: Lorenza Burton, CST  Estimated Blood Loss: Minimal               Indications: This is a 66 year old gentleman who presents with a symptomatic right inguinal hernia as well as a very small umbilical hernia.  The decision been made to proceed to the operating room for repair of both hernias.  Findings: The patient was found to have an indirect right inguinal hernia as well as a very weak right inguinal floor.  This was repaired with a large piece of Prolene ProGrip mesh from [employer].  There was a 7 mm fascial defect at the umbilicus which was repaired by merrily without mesh.  Procedure: The patient was brought to the operating room identifies the correct patient.  He was placed upon on the operating room table and general anesthesia was induced.  His abdomen was prepped and draped in the usual sterile fashion.  I anesthetized the skin in the right inguinal area with Marcaine and then made a longitudinal incision with a scalpel.  I carried this down through Scarpa's fascia with electrocautery.  The external oblique fascia was then identified and opened toward the internal and external ring.  I easily identified the testicular cord structures and control these with a Penrose drain.  He had an indirect hernia sac which I dissected down to the base.  All contents had already been used from the sac.  I tied off the base of the sac with a 3-0 silk suture and then excised the redundant sac.  The patient had a very weak inguinal floor and I imbricated it with several interrupted 3-0 silk sutures.  Next a large piece of Prolene ProGrip mesh were  brought to the field.  I placed against the pubic tubercle and then brought around the cord structures completely covering the inguinal floor and internal ring.  This was sutured in place with 2 separate 2-0 Vicryl sutures with 1 to the pubic tubercle and 1 to the transversalis muscle.  Wide coverage of the inguinal floor and internal ring appeared to be achieved.  I then closed the external bleak fascia over the top of the mesh with a running 2-0 Vicryl suture.  Scarpa's fascia was closed with interrupted 3-0 Vicryl sutures and the skin was closed with running 4-0 Monocryl.  Next anesthetized the skin at the umbilicus with Marcaine.  I made a semicircular incision of the lower edge of the umbilicus with a scalpel.  I then carried this down to the hernia sac which was separated from the overlying umbilical skin.  I dissected the back down to the fascia.  I excised the sac and reduced the fatty contents back into the abdominal cavity.  The fascial defect was only 7 mm.  I repaired this with several figure-of-eight 0 Ethibond sutures.  I elected not to use mesh as the hernia was so small.  I then anesthetized the fascia with Marcaine.  I then tacked the umbilical skin back in place with a 3-0 Vicryl suture.  I then closed the subcutaneous tissue with interrupted 3-0 Vicryl sutures and closed the skin with a running 4-0 Monocryl.  Dermabond was  then applied.  Dermabond was also applied to the inguinal incision.  The patient tolerated the procedure well.  All the counts were correct at the end of the procedure.  The patient was then extubated in the operating room and taken in a stable condition to the recovery room.          Coralie Keens   Date: 06/11/2022  Time: 9:10 AM

## 2022-06-11 NOTE — Anesthesia Postprocedure Evaluation (Signed)
Anesthesia Post Note  Patient: Philip Hernandez  Procedure(s) Performed: OPEN RIGHT INGUINAL HERNIA REPAIR WITH MESH (Right: Groin) HERNIA REPAIR UMBILICAL ADULT WITH MESH (Abdomen)     Patient location during evaluation: PACU Anesthesia Type: General Level of consciousness: awake and alert Pain management: pain level controlled Vital Signs Assessment: post-procedure vital signs reviewed and stable Respiratory status: spontaneous breathing, nonlabored ventilation, respiratory function stable and patient connected to nasal cannula oxygen Cardiovascular status: blood pressure returned to baseline and stable Postop Assessment: no apparent nausea or vomiting Anesthetic complications: no   No notable events documented.  Last Vitals:  Vitals:   06/11/22 0745 06/11/22 0910  BP: (!) 143/73 (!) 156/74  Pulse: (!) 54 60  Resp: 10 10  Temp:  (!) 36.3 C  SpO2: 100% 100%    Last Pain:  Vitals:   06/11/22 0706  TempSrc: Tympanic  PainSc: 0-No pain                 Nasif Bos S

## 2022-06-11 NOTE — Anesthesia Preprocedure Evaluation (Signed)
Anesthesia Evaluation  Patient identified by MRN, date of birth, ID band Patient awake    Reviewed: Allergy & Precautions, NPO status , Patient's Chart, lab work & pertinent test results  Airway Mallampati: II  TM Distance: >3 FB Neck ROM: Full    Dental no notable dental hx.    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, neck nodes, splenic granuloma    Pulmonary exam normal breath sounds clear to auscultation       Cardiovascular negative cardio ROS Normal cardiovascular exam Rhythm:Regular Rate:Normal     Neuro/Psych negative neurological ROS  negative psych ROS   GI/Hepatic Neg liver ROS, GERD  Medicated,  Endo/Other  negative endocrine ROS  Renal/GU negative Renal ROS  negative genitourinary   Musculoskeletal negative musculoskeletal ROS (+)   Abdominal   Peds negative pediatric ROS (+)  Hematology negative hematology ROS (+)   Anesthesia Other Findings   Reproductive/Obstetrics negative OB ROS                             Anesthesia Physical Anesthesia Plan  ASA: 2  Anesthesia Plan: General   Post-op Pain Management: Regional block*   Induction: Intravenous  PONV Risk Score and Plan: 2 and Ondansetron, Dexamethasone and Treatment may vary due to age or medical condition  Airway Management Planned: LMA  Additional Equipment:   Intra-op Plan:   Post-operative Plan: Extubation in OR  Informed Consent: I have reviewed the patients History and Physical, chart, labs and discussed the procedure including the risks, benefits and alternatives for the proposed anesthesia with the patient or authorized representative who has indicated his/her understanding and acceptance.     Dental advisory given  Plan Discussed with: CRNA and Surgeon  Anesthesia Plan Comments:         Anesthesia Quick Evaluation

## 2022-06-12 ENCOUNTER — Encounter (HOSPITAL_BASED_OUTPATIENT_CLINIC_OR_DEPARTMENT_OTHER): Payer: Self-pay | Admitting: Surgery

## 2022-06-13 ENCOUNTER — Other Ambulatory Visit: Payer: Self-pay

## 2022-06-13 DIAGNOSIS — E875 Hyperkalemia: Secondary | ICD-10-CM

## 2022-06-15 ENCOUNTER — Encounter: Payer: Self-pay | Admitting: Family Medicine

## 2022-06-15 ENCOUNTER — Ambulatory Visit (INDEPENDENT_AMBULATORY_CARE_PROVIDER_SITE_OTHER): Payer: BC Managed Care – PPO | Admitting: Family Medicine

## 2022-06-15 DIAGNOSIS — K219 Gastro-esophageal reflux disease without esophagitis: Secondary | ICD-10-CM | POA: Diagnosis not present

## 2022-06-15 DIAGNOSIS — E782 Mixed hyperlipidemia: Secondary | ICD-10-CM

## 2022-06-15 DIAGNOSIS — E875 Hyperkalemia: Secondary | ICD-10-CM

## 2022-06-15 DIAGNOSIS — Z23 Encounter for immunization: Secondary | ICD-10-CM | POA: Diagnosis not present

## 2022-06-15 DIAGNOSIS — D8689 Sarcoidosis of other sites: Secondary | ICD-10-CM

## 2022-06-15 LAB — BMP8+EGFR
BUN/Creatinine Ratio: 17 (ref 10–24)
BUN: 19 mg/dL (ref 8–27)
CO2: 25 mmol/L (ref 20–29)
Calcium: 9.8 mg/dL (ref 8.6–10.2)
Chloride: 101 mmol/L (ref 96–106)
Creatinine, Ser: 1.12 mg/dL (ref 0.76–1.27)
Glucose: 96 mg/dL (ref 70–99)
Potassium: 5.6 mmol/L — ABNORMAL HIGH (ref 3.5–5.2)
Sodium: 140 mmol/L (ref 134–144)
eGFR: 73 mL/min/{1.73_m2} (ref >59)

## 2022-06-15 MED ORDER — ROSUVASTATIN CALCIUM 10 MG PO TABS: 10.0000 mg | ORAL_TABLET | Freq: Every day | ORAL | 3 refills | Status: DC

## 2022-06-15 NOTE — Progress Notes (Signed)
There were no vitals taken for this visit.   Subjective:   Patient ID: Philip Hernandez, male    DOB: Oct 02, 1955, 66 y.o.   MRN: [id number]  HPI: Philip Hernandez is a 66 y.o. male presenting on 06/15/2022 for Medical Management of Chronic Issues, Hyperlipidemia, and elevated potassium   HPI Hyperlipidemia Patient is coming in for recheck of his hyperlipidemia. The patient is currently taking Crestor. They deny any issues with myalgias or history of liver damage from it. They deny any focal numbness or weakness or chest pain.   GERD Patient is currently on omeprazole.  She denies any major symptoms or abdominal pain or belching or burping. She denies any blood in her stool or lightheadedness or dizziness.   Patient has peripheral neuropathy secondary to sarcoidosis and takes gabapentin for this.  He sees specialist for this.  Patient recently had right inguinal and periumbilical hernia surgery and is recovering, once just this last week  Elevated potassium Patient had an elevated potassium of 5.7 on his last blood draw a week ago, will recheck today.  Relevant past medical, surgical, family and social history reviewed and updated as indicated. Interim medical history since our last visit reviewed. Allergies and medications reviewed and updated.  Review of Systems  Constitutional:  Negative for chills and fever.  Eyes:  Negative for visual disturbance.  Respiratory:  Negative for shortness of breath and wheezing.   Cardiovascular:  Negative for chest pain and leg swelling.  Musculoskeletal:  Negative for back pain and gait problem.  Skin:  Negative for rash.  Neurological:  Negative for dizziness, weakness and light-headedness.  All other systems reviewed and are negative.   Per HPI unless specifically indicated above   Allergies as of 06/15/2022       Reactions   Corn-containing Products         Medication List        Accurate as of June 15, 2022 10:30 AM. If you  have any questions, ask your nurse or doctor.          acetaminophen 500 MG tablet Commonly known as: TYLENOL Take 500 mg by mouth every 6 (six) hours as needed.   AIRBORNE PO Take by mouth daily.   carbamazepine 100 MG 12 hr capsule Commonly known as: CARBATROL Take 1 capsule (100 mg total) by mouth 2 (two) times daily. What changed: when to take this   cetirizine 10 MG tablet Commonly known as: ZYRTEC Take 10 mg by mouth daily.   cholecalciferol 1000 units tablet Commonly known as: VITAMIN D Take 1,000 Units by mouth daily.   gabapentin 800 MG tablet Commonly known as: NEURONTIN TAKE 1 TABLET BY MOUTH 4  TIMES DAILY   omeprazole 40 MG capsule Commonly known as: PRILOSEC TAKE ONE CAPSULE BY MOUTH TWICE A DAY   rosuvastatin 10 MG tablet Commonly known as: CRESTOR Take 1 tablet (10 mg total) by mouth daily.   [place] 4 % Crea Generic drug: Fluorouracil Apply topically.   traMADol 50 MG tablet Commonly known as: ULTRAM Take 1 tablet (50 mg total) by mouth every 6 (six) hours as needed.   triamcinolone cream 0.1 % Commonly known as: KENALOG Apply 1 Application topically 2 (two) times daily.         Objective:   There were no vitals taken for this visit.  Wt Readings from Last 3 Encounters:  06/11/22 183 lb 3.2 oz (83.1 kg)  04/10/22 183 lb (83 kg)  01/05/22 186 lb (84.4  kg)    Physical Exam Vitals and nursing note reviewed.  Constitutional:      General: He is not in acute distress.    Appearance: He is well-developed. He is not diaphoretic.  Eyes:     General: No scleral icterus.    Conjunctiva/sclera: Conjunctivae normal.  Neck:     Thyroid: No thyromegaly.  Cardiovascular:     Rate and Rhythm: Normal rate and regular rhythm.     Heart sounds: Normal heart sounds. No murmur heard. Pulmonary:     Effort: Pulmonary effort is normal. No respiratory distress.     Breath sounds: Normal breath sounds. No wheezing.  Musculoskeletal:         General: No swelling. Normal range of motion.     Cervical back: Neck supple.  Lymphadenopathy:     Cervical: No cervical adenopathy.  Skin:    General: Skin is warm and dry.     Findings: No rash.  Neurological:     Mental Status: He is alert and oriented to person, place, and time.     Coordination: Coordination normal.  Psychiatric:        Behavior: Behavior normal.     Results for orders placed or performed in visit on 06/08/22  CBC with Differential/Platelet  Result Value Ref Range   WBC 6.9 3.4 - 10.8 x10E3/uL   RBC 4.85 4.14 - 5.80 x10E6/uL   Hemoglobin 14.7 13.0 - 17.7 g/dL   Hematocrit 41.2 37.5 - 51.0 %   MCV 85 79 - 97 fL   MCH 30.3 26.6 - 33.0 pg   MCHC 35.7 31.5 - 35.7 g/dL   RDW 13.1 11.6 - 15.4 %   Platelets 150 150 - 450 x10E3/uL   Neutrophils 60 Not Estab. %   Lymphs 27 Not Estab. %   Monocytes 9 Not Estab. %   Eos 3 Not Estab. %   Basos 1 Not Estab. %   Neutrophils Absolute 4.1 1.4 - 7.0 x10E3/uL   Lymphocytes Absolute 1.8 0.7 - 3.1 x10E3/uL   Monocytes Absolute 0.6 0.1 - 0.9 x10E3/uL   EOS (ABSOLUTE) 0.2 0.0 - 0.4 x10E3/uL   Basophils Absolute 0.1 0.0 - 0.2 x10E3/uL   Immature Granulocytes 0 Not Estab. %   Immature Grans (Abs) 0.0 0.0 - 0.1 x10E3/uL  CMP14+EGFR  Result Value Ref Range   Glucose 86 70 - 99 mg/dL   BUN 12 8 - 27 mg/dL   Creatinine, Ser 1.14 0.76 - 1.27 mg/dL   eGFR 71 >59 mL/min/1.73   BUN/Creatinine Ratio 11 10 - 24   Sodium 142 134 - 144 mmol/L   Potassium 5.7 (H) 3.5 - 5.2 mmol/L   Chloride 104 96 - 106 mmol/L   CO2 25 20 - 29 mmol/L   Calcium 9.4 8.6 - 10.2 mg/dL   Total Protein 6.7 6.0 - 8.5 g/dL   Albumin 4.6 3.9 - 4.9 g/dL   Globulin, Total 2.1 1.5 - 4.5 g/dL   Albumin/Globulin Ratio 2.2 1.2 - 2.2   Bilirubin Total 0.5 0.0 - 1.2 mg/dL   Alkaline Phosphatase 93 44 - 121 IU/L   AST 24 0 - 40 IU/L   ALT 24 0 - 44 IU/L  Lipid panel  Result Value Ref Range   Cholesterol, Total 141 100 - 199 mg/dL   Triglycerides 143 0 -  149 mg/dL   HDL 38 (L) >39 mg/dL   VLDL Cholesterol Cal 25 5 - 40 mg/dL   LDL Chol Calc (NIH) 78 0 -  99 mg/dL   Chol/HDL Ratio 3.7 0.0 - 5.0 ratio  TSH  Result Value Ref Range   TSH 2.230 0.450 - 4.500 uIU/mL    Assessment & Plan:   Problem List Items Addressed This Visit       Digestive   GERD     Nervous and Auditory   Peripheral neuropathy in sarcoidosis     Other   HLD (hyperlipidemia) - Primary   Relevant Medications   rosuvastatin (CRESTOR) 10 MG tablet   Other Visit Diagnoses     Serum potassium elevated       Relevant Orders   BMP8+EGFR   Need for immunization against influenza       Relevant Orders   Flu Vaccine QUAD High Dose(Fluad) (Completed)       Recheck patient's potassium today, continue current medicine.  He is using MiraLAX for his surgery recovery and recommended that he walk more. Follow up plan: Return in about 6 months (around 12/14/2022), or if symptoms worsen or fail to improve, for Hyperlipidemia.  Counseling provided for all of the vaccine components Orders Placed This Encounter  Procedures   Flu Vaccine QUAD High Dose(Fluad)    [name], MD [facility] 06/15/2022, 10:30 AM

## 2022-06-18 ENCOUNTER — Ambulatory Visit: Payer: BC Managed Care – PPO | Admitting: Family Medicine

## 2022-06-20 ENCOUNTER — Other Ambulatory Visit: Payer: Self-pay

## 2022-06-20 DIAGNOSIS — E875 Hyperkalemia: Secondary | ICD-10-CM

## 2022-07-04 ENCOUNTER — Other Ambulatory Visit: Payer: BC Managed Care – PPO

## 2022-07-04 DIAGNOSIS — E875 Hyperkalemia: Secondary | ICD-10-CM

## 2022-07-05 LAB — BMP8+EGFR
BUN/Creatinine Ratio: 13 (ref 10–24)
BUN: 14 mg/dL (ref 8–27)
CO2: 26 mmol/L (ref 20–29)
Calcium: 9.6 mg/dL (ref 8.6–10.2)
Chloride: 104 mmol/L (ref 96–106)
Creatinine, Ser: 1.12 mg/dL (ref 0.76–1.27)
Glucose: 82 mg/dL (ref 70–99)
Potassium: 5.2 mmol/L (ref 3.5–5.2)
Sodium: 142 mmol/L (ref 134–144)
eGFR: 73 mL/min/{1.73_m2} (ref >59)

## 2022-07-10 NOTE — Progress Notes (Signed)
PATIENT: Philip Hernandez DOB: 06-04-1956   [facility] provider: Dr. Leonie Man REASON FOR VISIT: Neuropathy HISTORY FROM: patient    Chief complaint: No chief complaint on file.      HISTORY OF PRESENT ILLNESS:  Update 07/11/2022 JM: Patient returns for neuropathic pain follow-up visit after prior visit with Dr. Leonie Man 3 months ago.  He gradually tapered off carbamazepine since prior visit without any significant changes of neuropathic pain.  He remains on gabapentin 800 mg 4 times daily.        History provided for reference purposes only Update 04/10/2022 Dr. Leonie Man He returns for follow-up after last visit with Philip Hernandez practitioner in 6 months ago.  Patient had complained of persistent pain in the legs and cramping particularly at night so carbamazepine was added.  He is currently taking 100 mg 3 times daily and is not sure if it is really helping him.  However he admits that at night he does not have to get up so often and is sleeping better.  Towards the end of March she developed itchy painful rash in both upper extremities over the forearms left more than right.  He was not sure if this was related to exposure to sun or from carbamazepine.  He applied lotion for few days without any benefit but then the rash went away by itself.  He was seen by dermatologist who gave him a lotion but patient did not apply it.  Patient is still not entirely convinced that this was not related to the carbamazepine.  He is tolerating the current dose of 100 mg 3 times daily quite well without any obvious side effects.  He had lab work done yesterday which showed carbamazepine levels to be normal at 6.2.  CBC and CMP were unremarkable with serum sodium being on the low side 134.  Patient remains on gabapentin 804 times daily which is tolerating well without side effects.  He has no new complaints today.  Update 10/03/2021 JM: Returns for 74-monthneuropathy follow-up.  Remains on gabapentin 800 mg 4x daily.  Does report consistent pain during the day which has been stable but now worse at night with increased pain and cramping. Also reports increased LUE cramping (from elbow to finger) and continued occasional b/l hand cramping.  No further concerns at this time.  Update 03/22/2021 JM: Mr. SDevonshirereturns for 645-montheuropathy follow-up.  Reports neuropathy has been stable without worsening.  Continues gabapentin '800mg'$  4 times daily tolerating without side effects.  He does continue to experience pain daily which can interfere with daily activity and functioning but does notice worsening if he misses gabapentin dosage.  He tries to stay active as tolerated.  He does have occasional bilateral foot cramping which may last 15 to 30 seconds and then subside -this is been an ongoing symptom and not worsening.  No new concerns at this time.  Update 09/20/2020 JM: [name] for 6-36-monthllow-up regarding neuropathy.  Remains on gabapentin 800 mg 4 times daily tolerating well without side effects.  Neuropathy has been stable and gabapentin continues to provide benefit.  He will have occasional difficulty remembering to take all 4 doses and will experience worsening symptoms if he misses a dose.  He has been keeping active with routine activity and exercise in fact neuropathy symptoms can worsen after sitting for prolonged period of time. He will have occasional difficulty holding or picking up objects but relatively able to maintain ADLs and IADLs independently.  He does  have occasional balance difficulties but overall stable.  Update 03/15/2020 JM:, [name] returns for follow-up for neuropathy with paresthesias.  He has been stable since prior visit 6 months ago with unchanged symptoms of numbness/tingling and paresthesias in bilateral lower extremities.  He continues to experience good days and bad days.  Continues on gabapentin 800 mg 4 times daily tolerating dosage well. At times, may forget to take day time  dosage and will notice worsening in symptoms.  He has not trialed additional dose of gabapentin with worsening symptoms as previously discussed with Dr. Leonie Man. No further concerns at this time.  Update 09/15/2019 Dr. Leonie Man : He returns for follow-up after last virtual video visit on 03/02/2019.  He continues to have tingling numbness and paresthesias in his feet which are more or less unchanged.  He has good days and bad days.  Physical activity does increase it.  Patient was started on Topamax previously and I will increase the dose to 50 mg twice daily but he had trouble tolerating this and stated that he could not focus with his eyes and had trouble reading and had to rub his eyes.  After taking it for 4 to 6 weeks he discontinued it.  He did note that increasing the dose of Topamax did help to some degree.  He remains on gabapentin and the current dose of 800 mg 4 times daily.  He has not tried Lyrica or Tegretol yet but does not want to try new medication.  Patient was diagnosed with Covid infection 3 weeks ago and did self quarantine himself and now has started going out last few days.  He is currently on prednisone taper as he also has sarcoidosis.  He does complain of feeling tired and having a minimum cough but is otherwise doing well.  His paresthesias are constant but is able to walk well he will have no problems with falls or balance issues.  Update via virtual visit 03/02/2019 Dr. Leonie Man: [name] is seen today for virtual video office follow-up visit following last visit with Philip Hernandez, nurse practitioner on 08/25/2018.  He states he is doing about the same.  He still has paresthesias in his feet off and on.  He has good days and bad days.  He remains on Topamax 25 mg twice daily which is tolerating well but he is states if not sure if it is doing anything.  He also remains on gabapentin 800 mg 4 times daily and he does not miss a dose.  He continues to have gait and balance difficulties but is  able to catch himself and has not fallen or hurt himself.  He does not use a cane or walker.  He recently got handicap parking sticker renewed by me.  He has no new neurological complaints.  Update 08/25/2018 MM: Mr. Kleven is a 66 year old male with a history of paresthesias in the feet.  He returns today for follow-up.  He remains on gabapentin 800 mg 4 times a day.  He states that there are times that he will miss doses and his pain worsens.  Patient was also advised to start taking Topamax consistently.  He states that he is taking it more than he was but not necessarily consistently.  He states that he always has burning and tingling in the feet although most the time it is tolerable during the day unless he misses a dose of gabapentin.  He denies any significant changes with his gait or balance.  He does  state that he is off balance at times.  Fortunately he has not had any recent falls.  He returns today for evaluation.   HISTORY 02/03/18: Mr. Oats is a 66 year old male with a history of paresthesias in the feet.  He returns today for follow-up.  He is currently on gabapentin 800 mg 4 times a day.  He reports that this work well most the time.  He states recently he has noticed  more pain in the legs.  He states that he does not take Topamax consistently.  Reports that he went to [place] recently and he did take Topamax daily and he felt that his pain was under better control.  Denies any significant changes with his gait or balance.  Denies any falls.  He returns today for evaluation.   REVIEW OF SYSTEMS: Out of a complete 14 system review of symptoms, the patient complains only of the following symptoms, and all other reviewed systems are negative.  See HPI  ALLERGIES: Allergies  Allergen Reactions   Corn-Containing Products     HOME MEDICATIONS: Outpatient Medications Prior to Visit  Medication Sig Dispense Refill   acetaminophen (TYLENOL) 500 MG tablet Take 500 mg by mouth every 6 (six)  hours as needed.     carbamazepine (CARBATROL) 100 MG 12 hr capsule Take 1 capsule (100 mg total) by mouth 2 (two) times daily. (Patient taking differently: Take 100 mg by mouth daily.) 90 capsule 11   cetirizine (ZYRTEC) 10 MG tablet Take 10 mg by mouth daily.     cholecalciferol (VITAMIN D) 1000 UNITS tablet Take 1,000 Units by mouth daily.     Fluorouracil (TOLAK) 4 % CREA Apply topically.     gabapentin (NEURONTIN) 800 MG tablet TAKE 1 TABLET BY MOUTH 4  TIMES DAILY 360 tablet 3   Multiple Vitamins-Minerals (AIRBORNE PO) Take by mouth daily.     omeprazole (PRILOSEC) 40 MG capsule TAKE ONE CAPSULE BY MOUTH TWICE A DAY 180 capsule 3   rosuvastatin (CRESTOR) 10 MG tablet Take 1 tablet (10 mg total) by mouth daily. 90 tablet 3   traMADol (ULTRAM) 50 MG tablet Take 1 tablet (50 mg total) by mouth every 6 (six) hours as needed. 25 tablet 0   triamcinolone cream (KENALOG) 0.1 % Apply 1 Application topically 2 (two) times daily.     No facility-administered medications prior to visit.    PAST MEDICAL HISTORY: Past Medical History:  Diagnosis Date   Allergy    Arthritis    Cataract    Colon polyp    Colon polyps 2015   Cough    chronic sinus issues, GERD with poor dietary control and Pulmonary Sarcoid.    GERD (gastroesophageal reflux disease) 09/2010   [name]. Bx proven.  Deve abd pain in Jan 2012 following high dose steroids for sarcoid. s/p EGD and improvement in symptoms following PPI   Hypogonadism male    Kidney stone    Malignant neoplasm of prostate ([place]) 08/2009   [name]. s/p surgery dec 2010, hx of rnormal PSA  since then   Other and unspecified mycoses    Painful respiration    Peripheral neuropathy    Prostate cancer ([place])    reoccurence (getting radiation 01-14-17 start).   Sarcoidosis 08/2010   stage 2 pulmonary, neck nodes, splenic granuloma   Skin cancer    Swelling, mass, or lump in head and neck     PAST SURGICAL HISTORY: Past Surgical History:   Procedure Laterality Date  HERNIA REPAIR  13 yrs ago   [place] Right 06/11/2022   Procedure: OPEN RIGHT INGUINAL HERNIA REPAIR WITH MESH;  Surgeon: Coralie Keens, MD;  Location: [facility];  Service: General;  Laterality: Right;  LMA   LYMPH NODE BIOPSY  05/2010, 07/2010   nasal polyps  1984   NASAL SINUS SURGERY  2013   PROSTATE SURGERY  2010   radiation 2018   SHOULDER SURGERY Right 2009, 2010   x 2   SKIN CANCER EXCISION  02/17/36   UMBILICAL HERNIA REPAIR N/A 06/11/2022   Procedure: HERNIA REPAIR UMBILICAL ADULT;  Surgeon: Coralie Keens, MD;  Location: [facility];  Service: General;  Laterality: N/A;    FAMILY HISTORY: Family History  Problem Relation Age of Onset   Colon cancer Mother 50   Skin cancer Mother    Cancer Mother        colon   Colon cancer Brother 45   Skin cancer Brother    Cancer Brother        colon   Colon cancer Maternal Aunt        dx in her 58s   Cancer Maternal Aunt        "cancer of male organs" dx in her 49s   Breast cancer Maternal Aunt 66       bilateral, dx again at 8   Colon cancer Maternal Aunt 35   Stomach cancer Maternal Aunt 68   Dementia Father    Stomach cancer Maternal Uncle 47   Skin cancer Paternal Aunt    Prostate cancer Paternal Uncle 77   Cancer Paternal Uncle        prostate   Stomach cancer Paternal Grandmother        dx in her 14s   Stroke Paternal Grandfather    Colon cancer Maternal Aunt 68   Prostate cancer Paternal Uncle    Cancer Paternal Uncle        prostate   Prostate cancer Paternal Uncle    Kidney cancer Paternal Uncle    Lung cancer Paternal Uncle    Cancer Paternal Uncle        prostate   Lung cancer Paternal Uncle    Cancer Paternal Uncle        unknown   Cancer Paternal Uncle        prostate   Cancer Cousin        paternal cousin with cancer NOS   Cancer Cousin        paternal cousin with cancer NOS   Esophageal cancer Neg  Hx    Rectal cancer Neg Hx     SOCIAL HISTORY: Social History   Socioeconomic History   Marital status: Married    Spouse name: Not on file   Number of children: 2   Years of education: Not on file   Highest education level: Not on file  Occupational History   Occupation: unemployed  Tobacco Use   Smoking status: Never   Smokeless tobacco: Never  Vaping Use   Vaping Use: Never used  Substance and Sexual Activity   Alcohol use: No   Drug use: No   Sexual activity: Yes  Other Topics Concern   Not on file  Social History Narrative   Married   2 children   Social Determinants of Health   Financial Resource Strain: Not on file  Food Insecurity: Not on file  Transportation Needs: Not  on file  Physical Activity: Not on file  Stress: Not on file  Social Connections: Not on file  Intimate Partner Violence: Not on file      PHYSICAL EXAM  There were no vitals filed for this visit.    There is no height or weight on file to calculate BMI.  Generalized: Pleasant middle-aged Caucasian male, in no acute distress   Neurological examination  Mentation: Alert oriented to time, place, history taking. Follows all commands speech and language fluent Cranial nerve II-XII: Pupils were equal round reactive to light. Extraocular movements were full, visual field were full on confrontational test. Facial sensation and strength were normal. Uvula tongue midline. Head turning and shoulder shrug  were normal and symmetric. Motor: The motor testing reveals 5 over 5 strength of all 4 extremities. Good symmetric motor tone is noted throughout.  Sensory: Sensory testing decreased vibratory sensation distal bilateral lower extremities.. No evidence of extinction is noted.  Slight hyperesthesia over toes bilaterally. Decreased sensation bilateral hands distally to pin prick, vibration and temperature.  Subjective numbness BLE stocking distribution Coordination: Cerebellar testing reveals good  finger-nose-finger and heel-to-shin bilaterally.  Gait and station: Gait is normal. Tandem gait is slightly unsteady.  Romberg is negative. No drift is seen.  Reflexes: Deep tendon reflexes are symmetric and normal bilaterally.     DIAGNOSTIC DATA (LABS, IMAGING, TESTING) - I reviewed patient records, labs, notes, testing and imaging myself where available.  Lab Results  Component Value Date   WBC 6.9 06/08/2022   HGB 14.7 06/08/2022   HCT 41.2 06/08/2022   MCV 85 06/08/2022   PLT 150 06/08/2022      Component Value Date/Time   NA 142 07/04/2022 1405   K 5.2 07/04/2022 1405   CL 104 07/04/2022 1405   CO2 26 07/04/2022 1405   GLUCOSE 82 07/04/2022 1405   GLUCOSE 80 03/26/2013 1220   BUN 14 07/04/2022 1405   CREATININE 1.12 07/04/2022 1405   CREATININE 1.15 03/26/2013 1220   CALCIUM 9.6 07/04/2022 1405   PROT 6.7 06/08/2022 1043   ALBUMIN 4.6 06/08/2022 1043   AST 24 06/08/2022 1043   ALT 24 06/08/2022 1043   ALKPHOS 93 06/08/2022 1043   BILITOT 0.5 06/08/2022 1043   GFRNONAA 71 06/07/2020 1421   GFRNONAA 71 03/26/2013 1220   GFRAA 82 06/07/2020 1421   GFRAA 82 03/26/2013 1220   Lab Results  Component Value Date   CHOL 141 06/08/2022   HDL 38 (L) 06/08/2022   LDLCALC 78 06/08/2022   TRIG 143 06/08/2022   CHOLHDL 3.7 06/08/2022   No results found for: "HGBA1C" No results found for: "VITAMINB12" Lab Results  Component Value Date   TSH 2.230 06/08/2022    01/08/13 NCV/EMG Nerve conduction studies done on both lower extremities and the left upper extremity shows evidence of primarily motor involvement of the nerves in the legs, normal on the left arm. EMG evaluation of the left lower extremity shows mild primarily distal acute and chronic denervation, consistent with a peripheral neuropathy. There is no evidence of nerve root involvement. EMG evaluation shows evidence of a mononeuropathy affecting the median nerve proximally, again without evidence of a cervical  radiculopathy. In the appropriate clinical setting, the study could be consistent with Guillian-Barre syndrome, but some chronic features of denervation also seen. Sarcoidosis may be another consideration, but no sensory involvement is noted.      ASSESSMENT AND PLAN 66 y.o. year old male  has a past medical history of  Allergy, Arthritis, Cataract, Colon polyp, Colon polyps (2015), Cough, GERD (gastroesophageal reflux disease) (09/2010), Hypogonadism male, Kidney stone, Malignant neoplasm of prostate ([place]) (08/2009), Other and unspecified mycoses, Painful respiration, Peripheral neuropathy, Prostate cancer ([place]), Sarcoidosis (08/2010), Skin cancer, and Swelling, mass, or lump in head and neck. here with :   1.  Paresthesias in the lower extremities from small fiber neuropathy possibly related to sarcoidosis  -Continue gabapentin 800 mg 4 times daily - refill provided  -will discuss other treatment options with Dr. Leonie Man as symptoms seem progressive   -consider repeat EMG/NCV - prior testing in 2014 - unsure if warranted - will f/u with Dr. Leonie Man  -Previously tried: Topamax, carbamazepine and amitriptyline    Follow-up in 6 months with Dr. Leonie Man or call earlier if needed   CC:  Dettinger, Fransisca Kaufmann, MD    I spent 38 minutes of face-to-face and non-face-to-face time with patient.  This included previsit chart review, lab review, study review, order entry, electronic health record documentation, patient education and discussion regarding above diagnosis with progression and further information as above and answered all other questions to patient satisfaction   Frann Rider, [facility]  [facility] [address] [place], [place] [address]  Phone [phone] Fax [phone] Note: This document was prepared with digital dictation and possible smart phrase technology. Any transcriptional errors that result from this process are unintentional.

## 2022-07-11 ENCOUNTER — Encounter: Payer: Self-pay | Admitting: Adult Health

## 2022-07-11 ENCOUNTER — Ambulatory Visit (INDEPENDENT_AMBULATORY_CARE_PROVIDER_SITE_OTHER): Payer: BC Managed Care – PPO | Admitting: Adult Health

## 2022-07-11 VITALS — BP 130/89 | HR 62 | Ht 70.0 in | Wt 183.0 lb

## 2022-07-11 DIAGNOSIS — G629 Polyneuropathy, unspecified: Secondary | ICD-10-CM | POA: Diagnosis not present

## 2022-07-11 DIAGNOSIS — M65332 Trigger finger, left middle finger: Secondary | ICD-10-CM | POA: Diagnosis not present

## 2022-07-11 MED ORDER — GABAPENTIN 800 MG PO TABS: 800.0000 mg | ORAL_TABLET | Freq: Four times a day (QID) | ORAL | 3 refills | Status: DC

## 2022-07-11 NOTE — Patient Instructions (Addendum)
Your Plan:  Continue gabapentin '800mg'$  four times daily     Follow up in 6 months with Dr. Leonie Man or call earlier if needed     Thank you for coming to see [place] at [facility]. I hope we have been able to provide you high quality care today.  You may receive a patient satisfaction survey over the next few weeks. We would appreciate your feedback and comments so that we may continue to improve ourselves and the health of our patients.     Trigger Finger  Trigger finger, also called stenosing tenosynovitis,  is a condition that causes a finger to get stuck in a bent position. Each finger has a tendon, which is a tough, cord-like tissue that connects muscle to bone, and each tendon passes through a tunnel of tissue called a tendon sheath. To move your finger, your tendon needs to glide freely through the sheath. Trigger finger happens when the tendon or the sheath thickens, making it difficult to move your finger. Trigger finger can affect any finger or a thumb. It may affect more than one finger. Mild cases may clear up with rest and medicine. Severe cases require more treatment. What are the causes? Trigger finger is caused by a thickened finger tendon or tendon sheath. The cause of this thickening is not known. What increases the risk? The following factors may make you more likely to develop this condition: Doing activities that require a strong grip. Having rheumatoid arthritis, gout, or diabetes. Being 30-32 years old. Being male. What are the signs or symptoms? Symptoms of this condition include: Pain when bending or straightening your finger. Tenderness or swelling where your finger attaches to the palm of your hand. A lump in the palm of your hand or on the inside of your finger. Hearing a noise like a pop or a snap when you try to straighten your finger. Feeling a catching or locking sensation when you try to straighten your finger. Being unable to  straighten your finger. How is this diagnosed? This condition is diagnosed based on your symptoms and a physical exam. How is this treated? This condition may be treated by: Resting your finger and avoiding activities that make symptoms worse. Wearing a finger splint to keep your finger extended. Taking NSAIDs, such as ibuprofen, to relieve pain and swelling. Doing gentle exercises to stretch the finger as told by your health care provider. Having medicine that reduces swelling and inflammation (steroids) injected into the tendon sheath. Injections may need to be repeated. Having surgery to open the tendon sheath. This may be done if other treatments do not work and you cannot straighten your finger. You may need physical therapy after surgery. Follow these instructions at home: If you have a splint: Wear the splint as told by your health care provider. Remove it only as told by your health care provider. Loosen it if your fingers tingle, become numb, or turn cold and blue. Keep it clean. If the splint is not waterproof: Do not let it get wet. Cover it with a watertight covering when you take a bath or shower. Managing pain, stiffness, and swelling     If directed, apply heat to the affected area as often as told by your health care provider. Use the heat source that your health care provider recommends, such as a moist heat pack or a heating pad. Place a towel between your skin and the heat source. Leave the heat on for 20-30 minutes. Remove the  heat if your skin turns bright red. This is especially important if you are unable to feel pain, heat, or cold. You may have a greater risk of getting burned. If directed, put ice on the painful area. To do this: If you have a removable splint, remove it as told by your health care provider. Put ice in a plastic bag. Place a towel between your skin and the bag or between your splint and the bag. Leave the ice on for 20 minutes, 2-3 times a  day.  Activity Rest your finger as told by your health care provider. Avoid activities that make the pain worse. Return to your normal activities as told by your health care provider. Ask your health care provider what activities are safe for you. Do exercises as told by your health care provider. Ask your health care provider when it is safe to drive if you have a splint on your hand. General instructions Take over-the-counter and prescription medicines only as told by your health care provider. Keep all follow-up visits as told by your health care provider. This is important. Contact a health care provider if: Your symptoms are not improving with home care. Summary Trigger finger, also called stenosing tenosynovitis, causes your finger to get stuck in a bent position. This can make it difficult and painful to straighten your finger. This condition develops when a finger tendon or tendon sheath thickens. Treatment may include resting your finger, wearing a splint, and taking medicines. In severe cases, surgery to open the tendon sheath may be needed. This information is not intended to replace advice given to you by your health care provider. Make sure you discuss any questions you have with your health care provider. Document Revised: 01/19/2019 Document Reviewed: 01/19/2019 Elsevier Patient Education  [place].

## 2022-07-18 DIAGNOSIS — C61 Malignant neoplasm of prostate: Secondary | ICD-10-CM | POA: Diagnosis not present

## 2022-08-20 ENCOUNTER — Encounter: Payer: Self-pay | Admitting: Family Medicine

## 2022-08-20 ENCOUNTER — Ambulatory Visit (INDEPENDENT_AMBULATORY_CARE_PROVIDER_SITE_OTHER): Payer: BC Managed Care – PPO

## 2022-08-20 ENCOUNTER — Ambulatory Visit (INDEPENDENT_AMBULATORY_CARE_PROVIDER_SITE_OTHER): Payer: BC Managed Care – PPO | Admitting: Family Medicine

## 2022-08-20 VITALS — BP 125/69 | HR 53 | Temp 98.0°F | Ht 70.0 in | Wt 185.6 lb

## 2022-08-20 DIAGNOSIS — M25542 Pain in joints of left hand: Secondary | ICD-10-CM

## 2022-08-20 DIAGNOSIS — M19042 Primary osteoarthritis, left hand: Secondary | ICD-10-CM | POA: Diagnosis not present

## 2022-08-20 MED ORDER — DICLOFENAC SODIUM 1.6 % EX GEL: CUTANEOUS | 5 refills | Status: DC

## 2022-08-20 NOTE — Progress Notes (Signed)
Chief Complaint  Patient presents with   [place]    HPI  Patient presents today for 37mo. Left third finger pain. Has to manually straighten it q AM. PLynita LombardS an sticks when trying to extend. NKI  PMH: Smoking status noted ROS: Per HPI  Objective: BP 125/69   Pulse (!) 53   Temp 98 F (36.7 C)   Ht '5\' 10"'$  (1.778 m)   Wt 185 lb 9.6 oz (84.2 kg)   SpO2 100%   BMI 26.63 kg/m  Gen: NAD, alert, cooperative with exam HEENT: NCAT, EOMI, PERRL CV: RRR, good S1/S2, no murmur EYOM:AYOKHTat left third finger. FROM, has a catch at the PIP that easily is manually reduced.  Neuro: Alert and oriented, No gross deficits  Assessment and plan:  1. Pain, joint, hand, left     Meds ordered this encounter  Medications   Diclofenac Sodium 1.6 % GEL    Sig: Apply to the affected finger four times a day    Dispense:  300 g    Refill:  5    Orders Placed This Encounter  Procedures   DG Hand Complete Left    Standing Status:   Future    Number of Occurrences:   1    Standing Expiration Date:   09/20/2022    Order Specific Question:   Reason for Exam (SYMPTOM  OR DIAGNOSIS REQUIRED)    Answer:   pain, base of left third digit    Order Specific Question:   Preferred imaging location?    Answer:   Internal   Stretching exercises reviewed.  Follow up as needed.  WClaretta Fraise MD

## 2022-08-31 ENCOUNTER — Ambulatory Visit: Payer: BC Managed Care – PPO | Admitting: Family Medicine

## 2022-09-01 ENCOUNTER — Other Ambulatory Visit: Payer: Self-pay | Admitting: Adult Health

## 2022-10-01 DIAGNOSIS — L578 Other skin changes due to chronic exposure to nonionizing radiation: Secondary | ICD-10-CM | POA: Diagnosis not present

## 2022-10-01 DIAGNOSIS — D225 Melanocytic nevi of trunk: Secondary | ICD-10-CM | POA: Diagnosis not present

## 2022-10-01 DIAGNOSIS — L814 Other melanin hyperpigmentation: Secondary | ICD-10-CM | POA: Diagnosis not present

## 2022-10-01 DIAGNOSIS — L821 Other seborrheic keratosis: Secondary | ICD-10-CM | POA: Diagnosis not present

## 2022-10-01 DIAGNOSIS — Z09 Encounter for follow-up examination after completed treatment for conditions other than malignant neoplasm: Secondary | ICD-10-CM | POA: Diagnosis not present

## 2022-10-01 DIAGNOSIS — L570 Actinic keratosis: Secondary | ICD-10-CM | POA: Diagnosis not present

## 2022-10-16 DIAGNOSIS — M65332 Trigger finger, left middle finger: Secondary | ICD-10-CM | POA: Diagnosis not present

## 2022-11-27 ENCOUNTER — Telehealth: Payer: Self-pay | Admitting: Family Medicine

## 2022-11-27 NOTE — Telephone Encounter (Signed)
Contacted Philip Hernandez to schedule their annual wellness visit. Appointment made for 12/05/2022.  Thank you,  Colletta [place],  [facility] Direct Dial ??[phone]

## 2022-12-05 ENCOUNTER — Ambulatory Visit: Payer: BC Managed Care – PPO

## 2022-12-07 ENCOUNTER — Telehealth: Payer: Self-pay | Admitting: Family Medicine

## 2022-12-07 DIAGNOSIS — E782 Mixed hyperlipidemia: Secondary | ICD-10-CM

## 2022-12-07 DIAGNOSIS — E785 Hyperlipidemia, unspecified: Secondary | ICD-10-CM

## 2022-12-07 NOTE — Telephone Encounter (Signed)
Patient needs future lab order added to chart so that he can come in on Monday to do labs before his upcoming appt.

## 2022-12-07 NOTE — Telephone Encounter (Signed)
Future lab orders placed

## 2022-12-10 ENCOUNTER — Other Ambulatory Visit: Payer: BC Managed Care – PPO

## 2022-12-10 DIAGNOSIS — E785 Hyperlipidemia, unspecified: Secondary | ICD-10-CM | POA: Diagnosis not present

## 2022-12-11 LAB — CMP14+EGFR
ALT: 28 IU/L (ref 0–44)
AST: 26 IU/L (ref 0–40)
Albumin/Globulin Ratio: 2.2 (ref 1.2–2.2)
Albumin: 4.4 g/dL (ref 3.9–4.9)
Alkaline Phosphatase: 80 IU/L (ref 44–121)
BUN/Creatinine Ratio: 12 (ref 10–24)
BUN: 16 mg/dL (ref 8–27)
Bilirubin Total: 0.9 mg/dL (ref 0.0–1.2)
CO2: 25 mmol/L (ref 20–29)
Calcium: 9.6 mg/dL (ref 8.6–10.2)
Chloride: 105 mmol/L (ref 96–106)
Creatinine, Ser: 1.29 mg/dL — ABNORMAL HIGH (ref 0.76–1.27)
Globulin, Total: 2 g/dL (ref 1.5–4.5)
Glucose: 98 mg/dL (ref 70–99)
Potassium: 5.3 mmol/L — ABNORMAL HIGH (ref 3.5–5.2)
Sodium: 142 mmol/L (ref 134–144)
Total Protein: 6.4 g/dL (ref 6.0–8.5)
eGFR: 62 mL/min/{1.73_m2} (ref >59)

## 2022-12-11 LAB — LIPID PANEL
Chol/HDL Ratio: 3.7 ratio (ref 0.0–5.0)
Cholesterol, Total: 134 mg/dL (ref 100–199)
HDL: 36 mg/dL — ABNORMAL LOW (ref >39)
LDL Chol Calc (NIH): 75 mg/dL (ref 0–99)
Triglycerides: 125 mg/dL (ref 0–149)
VLDL Cholesterol Cal: 23 mg/dL (ref 5–40)

## 2022-12-11 LAB — CBC WITH DIFFERENTIAL/PLATELET
Basophils Absolute: 0.1 10*3/uL (ref 0.0–0.2)
Basos: 1 %
EOS (ABSOLUTE): 0.2 10*3/uL (ref 0.0–0.4)
Eos: 2 %
Hematocrit: 39.6 % (ref 37.5–51.0)
Hemoglobin: 13.9 g/dL (ref 13.0–17.7)
Immature Grans (Abs): 0 10*3/uL (ref 0.0–0.1)
Immature Granulocytes: 0 %
Lymphocytes Absolute: 2 10*3/uL (ref 0.7–3.1)
Lymphs: 31 %
MCH: 30 pg (ref 26.6–33.0)
MCHC: 35.1 g/dL (ref 31.5–35.7)
MCV: 86 fL (ref 79–97)
Monocytes Absolute: 0.7 10*3/uL (ref 0.1–0.9)
Monocytes: 11 %
Neutrophils Absolute: 3.7 10*3/uL (ref 1.4–7.0)
Neutrophils: 55 %
Platelets: 137 10*3/uL — ABNORMAL LOW (ref 150–450)
RBC: 4.63 x10E6/uL (ref 4.14–5.80)
RDW: 13.1 % (ref 11.6–15.4)
WBC: 6.6 10*3/uL (ref 3.4–10.8)

## 2022-12-11 LAB — TSH: TSH: 2.36 u[IU]/mL (ref 0.450–4.500)

## 2022-12-12 ENCOUNTER — Ambulatory Visit (INDEPENDENT_AMBULATORY_CARE_PROVIDER_SITE_OTHER): Payer: Medicare Other

## 2022-12-12 VITALS — Ht 70.0 in | Wt 180.0 lb

## 2022-12-12 DIAGNOSIS — Z0000 Encounter for general adult medical examination without abnormal findings: Secondary | ICD-10-CM

## 2022-12-12 NOTE — Patient Instructions (Signed)
Philip Hernandez , Thank you for taking time to come for your Medicare Wellness Visit. I appreciate your ongoing commitment to your health goals. Please review the following plan we discussed and let me know if I can assist you in the future.   These are the goals we discussed:  Goals      Prevent falls        This is a list of the screening recommended for you and due dates:  Health Maintenance  Topic Date Due   Zoster (Shingles) Vaccine (1 of 2) Never done   COVID-19 Vaccine (4 - 2023-24 season) 05/18/2022   Pneumonia Vaccine (1 of 1 - PCV) 06/16/2023*   Medicare Annual Wellness Visit  12/12/2023   Colon Cancer Screening  06/04/2024   DTaP/Tdap/Td vaccine (2 - Td or Tdap) 12/13/2027   Flu Shot  Completed   Hepatitis C Screening: USPSTF Recommendation to screen - Ages 18-79 yo.  Completed   HIV Screening  Completed   HPV Vaccine  Aged Out  *Topic was postponed. The date shown is not the original due date.    Advanced directives: Forms are available if you choose in the future to pursue completion.  This is recommended in order to make sure that your health wishes are honored in the event that you are unable to verbalize them to the provider.    Conditions/risks identified: Aim for 30 minutes of exercise or brisk walking, 6-8 glasses of water, and 5 servings of fruits and vegetables each day.   Next appointment: Follow up in one year for your annual wellness visit.   Preventive Care 31 Years and Older, Male  Preventive care refers to lifestyle choices and visits with your health care provider that can promote health and wellness. What does preventive care include? A yearly physical exam. This is also called an annual well check. Dental exams once or twice a year. Routine eye exams. Ask your health care provider how often you should have your eyes checked. Personal lifestyle choices, including: Daily care of your teeth and gums. Regular physical activity. Eating a healthy  diet. Avoiding tobacco and drug use. Limiting alcohol use. Practicing safe sex. Taking low doses of aspirin every day. Taking vitamin and mineral supplements as recommended by your health care provider. What happens during an annual well check? The services and screenings done by your health care provider during your annual well check will depend on your age, overall health, lifestyle risk factors, and family history of disease. Counseling  Your health care provider may ask you questions about your: Alcohol use. Tobacco use. Drug use. Emotional well-being. Home and relationship well-being. Sexual activity. Eating habits. History of falls. Memory and ability to understand (cognition). Work and work Statistician. Screening  You may have the following tests or measurements: Height, weight, and BMI. Blood pressure. Lipid and cholesterol levels. These may be checked every 5 years, or more frequently if you are over 80 years old. Skin check. Lung cancer screening. You may have this screening every year starting at age 22 if you have a 30-pack-year history of smoking and currently smoke or have quit within the past 15 years. Fecal occult blood test (FOBT) of the stool. You may have this test every year starting at age 35. Flexible sigmoidoscopy or colonoscopy. You may have a sigmoidoscopy every 5 years or a colonoscopy every 10 years starting at age 10. Prostate cancer screening. Recommendations will vary depending on your family history and other risks. Hepatitis C blood  test. Hepatitis B blood test. Sexually transmitted disease (STD) testing. Diabetes screening. This is done by checking your blood sugar (glucose) after you have not eaten for a while (fasting). You may have this done every 1-3 years. Abdominal aortic aneurysm (AAA) screening. You may need this if you are a current or former smoker. Osteoporosis. You may be screened starting at age 28 if you are at high risk. Talk with  your health care provider about your test results, treatment options, and if necessary, the need for more tests. Vaccines  Your health care provider may recommend certain vaccines, such as: Influenza vaccine. This is recommended every year. Tetanus, diphtheria, and acellular pertussis (Tdap, Td) vaccine. You may need a Td booster every 10 years. Zoster vaccine. You may need this after age 28. Pneumococcal 13-valent conjugate (PCV13) vaccine. One dose is recommended after age 20. Pneumococcal polysaccharide (PPSV23) vaccine. One dose is recommended after age 94. Talk to your health care provider about which screenings and vaccines you need and how often you need them. This information is not intended to replace advice given to you by your health care provider. Make sure you discuss any questions you have with your health care provider. Document Released: 09/30/2015 Document Revised: 05/23/2016 Document Reviewed: 07/05/2015 Elsevier Interactive Patient Education  2017 [place] Prevention in the Home Falls can cause injuries. They can happen to people of all ages. There are many things you can do to make your home safe and to help prevent falls. What can I do on the outside of my home? Regularly fix the edges of walkways and driveways and fix any cracks. Remove anything that might make you trip as you walk through a door, such as a raised step or threshold. Trim any bushes or trees on the path to your home. Use bright outdoor lighting. Clear any walking paths of anything that might make someone trip, such as rocks or tools. Regularly check to see if handrails are loose or broken. Make sure that both sides of any steps have handrails. Any raised decks and porches should have guardrails on the edges. Have any leaves, snow, or ice cleared regularly. Use sand or salt on walking paths during winter. Clean up any spills in your garage right away. This includes oil or grease spills. What  can I do in the bathroom? Use night lights. Install grab bars by the toilet and in the tub and shower. Do not use towel bars as grab bars. Use non-skid mats or decals in the tub or shower. If you need to sit down in the shower, use a plastic, non-slip stool. Keep the floor dry. Clean up any water that spills on the floor as soon as it happens. Remove soap buildup in the tub or shower regularly. Attach bath mats securely with double-sided non-slip rug tape. Do not have throw rugs and other things on the floor that can make you trip. What can I do in the bedroom? Use night lights. Make sure that you have a light by your bed that is easy to reach. Do not use any sheets or blankets that are too big for your bed. They should not hang down onto the floor. Have a firm chair that has side arms. You can use this for support while you get dressed. Do not have throw rugs and other things on the floor that can make you trip. What can I do in the kitchen? Clean up any spills right away. Avoid walking on wet  floors. Keep items that you use a lot in easy-to-reach places. If you need to reach something above you, use a strong step stool that has a grab bar. Keep electrical cords out of the way. Do not use floor polish or wax that makes floors slippery. If you must use wax, use non-skid floor wax. Do not have throw rugs and other things on the floor that can make you trip. What can I do with my stairs? Do not leave any items on the stairs. Make sure that there are handrails on both sides of the stairs and use them. Fix handrails that are broken or loose. Make sure that handrails are as long as the stairways. Check any carpeting to make sure that it is firmly attached to the stairs. Fix any carpet that is loose or worn. Avoid having throw rugs at the top or bottom of the stairs. If you do have throw rugs, attach them to the floor with carpet tape. Make sure that you have a light switch at the top of the  stairs and the bottom of the stairs. If you do not have them, ask someone to add them for you. What else can I do to help prevent falls? Wear shoes that: Do not have high heels. Have rubber bottoms. Are comfortable and fit you well. Are closed at the toe. Do not wear sandals. If you use a stepladder: Make sure that it is fully opened. Do not climb a closed stepladder. Make sure that both sides of the stepladder are locked into place. Ask someone to hold it for you, if possible. Clearly mark and make sure that you can see: Any grab bars or handrails. First and last steps. Where the edge of each step is. Use tools that help you move around (mobility aids) if they are needed. These include: Canes. Walkers. Scooters. Crutches. Turn on the lights when you go into a dark area. Replace any light bulbs as soon as they burn out. Set up your furniture so you have a clear path. Avoid moving your furniture around. If any of your floors are uneven, fix them. If there are any pets around you, be aware of where they are. Review your medicines with your doctor. Some medicines can make you feel dizzy. This can increase your chance of falling. Ask your doctor what other things that you can do to help prevent falls. This information is not intended to replace advice given to you by your health care provider. Make sure you discuss any questions you have with your health care provider. Document Released: 06/30/2009 Document Revised: 02/09/2016 Document Reviewed: 10/08/2014 Elsevier Interactive Patient Education  2017 [employer].

## 2022-12-12 NOTE — Progress Notes (Signed)
Subjective:   Philip Hernandez is a 67 y.o. male who presents for an Initial Medicare Annual Wellness Visit.  I connected with  Keturah Barre on 12/12/22 by a audio enabled telemedicine application and verified that I am speaking with the correct person using two identifiers.  Patient Location: Home  Provider Location: Home Office  I discussed the limitations of evaluation and management by telemedicine. The patient expressed understanding and agreed to proceed.  Review of Systems     Cardiac Risk Factors include: advanced age (>30men, >19 women);dyslipidemia;male gender;hypertension     Objective:    Today's Vitals   12/12/22 1242  Weight: 180 lb (81.6 kg)  Height: 5\' 10"  (1.778 m)   Body mass index is 25.83 kg/m.     12/12/2022   12:52 PM 06/11/2022    7:03 AM 05/30/2022    1:01 PM 04/24/2017    2:33 PM  Advanced Directives  Does Patient Have a Medical Advance Directive? No No Yes No  Type of Scientist, physiological of [place];Living will   Would patient like information on creating a medical advance directive? No - Patient declined No - Patient declined      Current Medications (verified) Outpatient Encounter Medications as of 12/12/2022  Medication Sig   acetaminophen (TYLENOL) 500 MG tablet Take 500 mg by mouth every 6 (six) hours as needed.   cetirizine (ZYRTEC) 10 MG tablet Take 10 mg by mouth daily.   cholecalciferol (VITAMIN D) 1000 UNITS tablet Take 1,000 Units by mouth daily.   Diclofenac Sodium 1.6 % GEL Apply to the affected finger four times a day   Fluorouracil (TOLAK) 4 % CREA Apply topically.   gabapentin (NEURONTIN) 800 MG tablet TAKE 1 TABLET BY MOUTH 4 TIMES  DAILY   Multiple Vitamins-Minerals (AIRBORNE PO) Take by mouth daily.   omeprazole (PRILOSEC) 40 MG capsule TAKE ONE CAPSULE BY MOUTH TWICE A DAY   rosuvastatin (CRESTOR) 10 MG tablet Take 1 tablet (10 mg total) by mouth daily.   triamcinolone cream (KENALOG) 0.1 % Apply 1 Application  topically 2 (two) times daily.   No facility-administered encounter medications on file as of 12/12/2022.    Allergies (verified) Corn-containing products   History: Past Medical History:  Diagnosis Date   Allergy    Arthritis    Cataract    Colon polyp    Colon polyps 2015   Cough    chronic sinus issues, GERD with poor dietary control and Pulmonary Sarcoid.    GERD (gastroesophageal reflux disease) 09/2010   [name]. Bx proven.  Deve abd pain in Jan 2012 following high dose steroids for sarcoid. s/p EGD and improvement in symptoms following PPI   Hypogonadism male    Kidney stone    Malignant neoplasm of prostate ([place]) 08/2009   [name]. s/p surgery dec 2010, hx of rnormal PSA  since then   Other and unspecified mycoses    Painful respiration    Peripheral neuropathy    Prostate cancer ([place])    reoccurence (getting radiation 01-14-17 start).   Sarcoidosis 08/2010   stage 2 pulmonary, neck nodes, splenic granuloma   Skin cancer    Swelling, mass, or lump in head and neck    Past Surgical History:  Procedure Laterality Date   HERNIA REPAIR  13 yrs ago   [place] Right 06/11/2022   Procedure: OPEN RIGHT INGUINAL HERNIA REPAIR WITH MESH;  Surgeon: Coralie Keens,  MD;  Location: [facility];  Service: General;  Laterality: Right;  LMA   LYMPH NODE BIOPSY  05/2010, 07/2010   nasal polyps  [place]  2013   PROSTATE SURGERY  2010   radiation 2018   SHOULDER SURGERY Right 2009, 2010   x 2   SKIN CANCER EXCISION  [id number]   UMBILICAL HERNIA REPAIR N/A 06/11/2022   Procedure: HERNIA REPAIR UMBILICAL ADULT;  Surgeon: Coralie Keens, MD;  Location: [facility];  Service: General;  Laterality: N/A;   Family History  Problem Relation Age of Onset   Colon cancer Mother 60   Skin cancer Mother    Cancer Mother        colon   Colon cancer Brother 8   Skin cancer Brother    Cancer  Brother        colon   Colon cancer Maternal Aunt        dx in her 47s   Cancer Maternal Aunt        "cancer of male organs" dx in her 49s   Breast cancer Maternal Aunt 66       bilateral, dx again at 48   Colon cancer Maternal Aunt 35   Stomach cancer Maternal Aunt 68   Dementia Father    Stomach cancer Maternal Uncle 47   Skin cancer Paternal Aunt    Prostate cancer Paternal Uncle 11   Cancer Paternal Uncle        prostate   Stomach cancer Paternal Grandmother        dx in her 31s   Stroke Paternal Grandfather    Colon cancer Maternal Aunt 68   Prostate cancer Paternal Uncle    Cancer Paternal Uncle        prostate   Prostate cancer Paternal Uncle    Kidney cancer Paternal Uncle    Lung cancer Paternal Uncle    Cancer Paternal Uncle        prostate   Lung cancer Paternal Uncle    Cancer Paternal Uncle        unknown   Cancer Paternal Uncle        prostate   Cancer Cousin        paternal cousin with cancer NOS   Cancer Cousin        paternal cousin with cancer NOS   Esophageal cancer Neg Hx    Rectal cancer Neg Hx    Social History   Socioeconomic History   Marital status: Married    Spouse name: Not on file   Number of children: 2   Years of education: Not on file   Highest education level: Not on file  Occupational History   Occupation: unemployed  Tobacco Use   Smoking status: Never   Smokeless tobacco: Never  Vaping Use   Vaping Use: Never used  Substance and Sexual Activity   Alcohol use: No   Drug use: No   Sexual activity: Yes  Other Topics Concern   Not on file  Social History Narrative   Married   2 children   Social Determinants of Health   Financial Resource Strain: Low Risk  (12/12/2022)   Overall Financial Resource Strain (CARDIA)    Difficulty of Paying Living Expenses: Not hard at all  Food Insecurity: No Food Insecurity (12/12/2022)   Hunger Vital Sign    Worried About Running Out of Food in the Last Year: Never true  Ran Out  of Food in the Last Year: Never true  Transportation Needs: No Transportation Needs (12/12/2022)   PRAPARE - Hydrologist (Medical): No    Lack of Transportation (Non-Medical): No  Physical Activity: Inactive (12/12/2022)   Exercise Vital Sign    Days of Exercise per Week: 0 days    Minutes of Exercise per Session: 0 min  Stress: No Stress Concern Present (12/12/2022)   [place]    Feeling of Stress : Not at all  Social Connections: Moderately Integrated (12/12/2022)   Social Connection and Isolation Panel [NHANES]    Frequency of Communication with Friends and Family: More than three times a week    Frequency of Social Gatherings with Friends and Family: More than three times a week    Attends Religious Services: 1 to 4 times per year    Active Member of [employer] or Organizations: No    Attends Music therapist: Never    Marital Status: Married    Tobacco Counseling Counseling given: Not Answered   Clinical Intake:  Pre-visit preparation completed: Yes  Pain : No/denies pain  Diabetes: No  How often do you need to have someone help you when you read instructions, pamphlets, or other written materials from your doctor or pharmacy?: 1 - Never  Diabetic?No   Interpreter Needed?: No  Information entered by :: Denman George LPN   Activities of Daily Living    12/12/2022   12:53 PM 06/11/2022    7:07 AM  In your present state of health, do you have any difficulty performing the following activities:  Hearing? 0 0  Vision? 0 0  Difficulty concentrating or making decisions? 0 0  Walking or climbing stairs? 1 0  Dressing or bathing? 0 0  Doing errands, shopping? 0   Preparing Food and eating ? N   Using the Toilet? N   In the past six months, have you accidently leaked urine? N   Do you have problems with loss of bowel control? N   Managing your Medications? N    Managing your Finances? N   Housekeeping or managing your Housekeeping? N     Patient Care Team: Dettinger, Fransisca Kaufmann, MD as PCP - General (Family Medicine) Melissa Montane, MD as Consulting Physician (Otolaryngology) Raynelle Bring, MD as Consulting Physician (Urology) Tommy Medal, Lavell Islam, MD as Consulting Physician (Infectious Diseases) Druscilla Brownie, MD as Consulting Physician (Dermatology) Garvin Fila, MD as Consulting Physician (Neurology)  Indicate any recent Medical Services you may have received from other than [facility] providers in the past year (date may be approximate).     Assessment:   This is a routine wellness examination for Renan.  Hearing/Vision screen Hearing Screening - Comments:: Denies hearing difficulties  Vision Screening - Comments:: Wears rx glasses - up to date with routine eye exams with [facility] Dr. Debe Coder     Dietary issues and exercise activities discussed: Current Exercise Habits: The patient does not participate in regular exercise at present   Goals Addressed             This Visit's Progress    Prevent falls        Depression Screen    12/12/2022   12:51 PM 08/20/2022    9:44 AM 06/15/2022   10:10 AM 12/14/2021   10:08 AM 10/12/2021   10:18 AM 07/07/2021    2:51 PM 12/13/2020   10:14 AM  PHQ 2/9 Scores  PHQ - 2 Score 0 0 0 0 0 0 0  PHQ- 9 Score   2 2       Fall Risk    12/12/2022   12:49 PM 08/20/2022    9:44 AM 06/15/2022   10:10 AM 12/14/2021   10:08 AM 10/12/2021   10:17 AM  Fall Risk   Falls in the past year? 0 0 0 0 0  Number falls in past yr: 0      Injury with Fall? 0      Risk for fall due to : No Fall Risks      Follow up Falls prevention discussed;Education provided;Falls evaluation completed        FALL RISK PREVENTION PERTAINING TO THE HOME:  Any stairs in or around the home? No  If so, are there any without handrails? No  Home free of loose throw rugs in walkways, pet beds, electrical cords, etc? Yes   Adequate lighting in your home to reduce risk of falls? Yes   ASSISTIVE DEVICES UTILIZED TO PREVENT FALLS:  Life alert? No  Use of a cane, walker or w/c? No  Grab bars in the bathroom? Yes  Shower chair or bench in shower? No  Elevated toilet seat or a handicapped toilet? Yes   TIMED UP AND GO:  Was the test performed? No . Telephonic visit   Cognitive Function:        12/12/2022   12:53 PM  6CIT Screen  What Year? 0 points  What month? 0 points  What time? 0 points  Count back from 20 0 points  Months in reverse 0 points  Repeat phrase 0 points  Total Score 0 points    Immunizations Immunization History  Administered Date(s) Administered   Fluad Quad(high Dose 65+) 06/16/2021, 06/15/2022   Influenza Whole 08/29/2010   Influenza, Quadrivalent, Recombinant, Inj, Pf 07/10/2019   Influenza,inj,Quad PF,6+ Mos 07/24/2013, 09/24/2014, 06/11/2015, 06/30/2016, 07/08/2017, 07/03/2018, 06/13/2020   Moderna Sars-Covid-2 Vaccination 10/10/2020   PFIZER(Purple Top)SARS-COV-2 Vaccination 02/17/2020, 03/09/2020   Tdap 12/12/2017    TDAP status: Up to date  Flu Vaccine status: Up to date  Pneumococcal vaccine status: Due, Education has been provided regarding the importance of this vaccine. Advised may receive this vaccine at local pharmacy or Health Dept. Aware to provide a copy of the vaccination record if obtained from local pharmacy or Health Dept. Verbalized acceptance and understanding.  Covid-19 vaccine status: Information provided on how to obtain vaccines.   Qualifies for Shingles Vaccine? Yes   Zostavax completed No   Shingrix Completed?: No.    Education has been provided regarding the importance of this vaccine. Patient has been advised to call insurance company to determine out of pocket expense if they have not yet received this vaccine. Advised may also receive vaccine at local pharmacy or Health Dept. Verbalized acceptance and understanding.  Screening  Tests Health Maintenance  Topic Date Due   Zoster Vaccines- Shingrix (1 of 2) Never done   COVID-19 Vaccine (4 - 2023-24 season) 05/18/2022   Pneumonia Vaccine 40+ Years old (1 of 1 - PCV) 06/16/2023 (Originally 05/02/2022)   Medicare Annual Wellness (AWV)  12/12/2023   COLONOSCOPY (Pts 45-45yrs Insurance coverage will need to be confirmed)  06/04/2024   DTaP/Tdap/Td (2 - Td or Tdap) 12/13/2027   INFLUENZA VACCINE  Completed   Hepatitis C Screening  Completed   HIV Screening  Completed   HPV VACCINES  Aged Out    Health  Maintenance  Health Maintenance Due  Topic Date Due   Zoster Vaccines- Shingrix (1 of 2) Never done   COVID-19 Vaccine (4 - 2023-24 season) 05/18/2022    Colorectal cancer screening: Type of screening: Colonoscopy. Completed 06/05/19. Repeat every 5 years  Lung Cancer Screening: (Low Dose CT Chest recommended if Age 68-80 years, 30 pack-year currently smoking OR have quit w/in 15years.) does not qualify.   Lung Cancer Screening Referral: n/a  Additional Screening:  Hepatitis C Screening: does qualify; Completed 11/30/16  Vision Screening: Recommended annual ophthalmology exams for early detection of glaucoma and other disorders of the eye. Is the patient up to date with their annual eye exam?  Yes  Who is the provider or what is the name of the office in which the patient attends annual eye exams? [facility] Dr[facility] Debe Coder If pt is not established with a provider, would they like to be referred to a provider to establish care? No .   Dental Screening: Recommended annual dental exams for proper oral hygiene  Community Resource Referral / Chronic Care Management: CRR required this visit?  No   CCM required this visit?  No      Plan:     I have personally reviewed and noted the following in the patient's chart:   Medical and social history Use of alcohol, tobacco or illicit drugs  Current medications and supplements including opioid prescriptions. Patient is  not currently taking opioid prescriptions. Functional ability and status Nutritional status Physical activity Advanced directives List of other physicians Hospitalizations, surgeries, and ER visits in previous 12 months Vitals Screenings to include cognitive, depression, and falls Referrals and appointments  In addition, I have reviewed and discussed with patient certain preventive protocols, quality metrics, and best practice recommendations. A written personalized care plan for preventive services as well as general preventive health recommendations were provided to patient.     Vanetta Mulders, [place]   [id number]   Due to this being a virtual visit, the after visit summary with patients personalized plan was offered to patient via mail or my-chart. Patient would like to access on my-chart  Nurse Notes: No concerns

## 2022-12-17 ENCOUNTER — Ambulatory Visit (INDEPENDENT_AMBULATORY_CARE_PROVIDER_SITE_OTHER): Payer: BC Managed Care – PPO | Admitting: Family Medicine

## 2022-12-17 ENCOUNTER — Encounter: Payer: Self-pay | Admitting: Family Medicine

## 2022-12-17 VITALS — BP 135/85 | HR 61 | Temp 97.4°F | Ht 70.0 in | Wt 187.6 lb

## 2022-12-17 DIAGNOSIS — D860 Sarcoidosis of lung: Secondary | ICD-10-CM

## 2022-12-17 DIAGNOSIS — E782 Mixed hyperlipidemia: Secondary | ICD-10-CM

## 2022-12-17 DIAGNOSIS — R7989 Other specified abnormal findings of blood chemistry: Secondary | ICD-10-CM

## 2022-12-17 DIAGNOSIS — K219 Gastro-esophageal reflux disease without esophagitis: Secondary | ICD-10-CM

## 2022-12-17 MED ORDER — OMEPRAZOLE 40 MG PO CPDR: DELAYED_RELEASE_CAPSULE | ORAL | 3 refills | Status: DC

## 2022-12-17 NOTE — Addendum Note (Signed)
Addended by: [name] on: 12/17/2022 10:42 AM   Modules accepted: Level of Service

## 2022-12-17 NOTE — Progress Notes (Addendum)
BP 135/85   Pulse 61   Temp (!) 97.4 F (36.3 C)   Ht 5\' 10"  (1.778 m)   Wt 187 lb 9.6 oz (85.1 kg)   SpO2 100%   BMI 26.92 kg/m    Subjective:   Patient ID: Philip Hernandez, male    DOB: 02/17/1956, 67 y.o.   MRN: [id number]  HPI: Philip Hernandez is a 67 y.o. male presenting on 12/17/2022 for Medical Management of Chronic Issues   HPI Hyperlipidemia Patient is coming in for recheck of his hyperlipidemia. The patient is currently taking Crestor. They deny any issues with myalgias or history of liver damage from it. They deny any focal numbness or weakness or chest pain.   GERD Patient is currently on omeprazole.  She denies any major symptoms or abdominal pain or belching or burping. She denies any blood in her stool or lightheadedness or dizziness.   Patient has a history of sarcoidosis of the lung Sees pulmonologist for this.  He says he was getting some episodes of exertional dyspnea but that is improved slightly.  Relevant past medical, surgical, family and social history reviewed and updated as indicated. Interim medical history since our last visit reviewed. Allergies and medications reviewed and updated.  Review of Systems  Constitutional:  Negative for chills and fever.  Eyes:  Negative for visual disturbance.  Respiratory:  Negative for shortness of breath and wheezing.   Cardiovascular:  Negative for chest pain and leg swelling.  Musculoskeletal:  Negative for back pain and gait problem.  Skin:  Negative for rash.  Neurological:  Negative for dizziness, weakness and light-headedness.  All other systems reviewed and are negative.   Per HPI unless specifically indicated above   Allergies as of 12/17/2022       Reactions   Corn-containing Products         Medication List        Accurate as of December 17, 2022 10:42 AM. If you have any questions, ask your nurse or doctor.          acetaminophen 500 MG tablet Commonly known as: TYLENOL Take 500 mg by mouth  every 6 (six) hours as needed.   AIRBORNE PO Take by mouth daily.   cetirizine 10 MG tablet Commonly known as: ZYRTEC Take 10 mg by mouth daily.   cholecalciferol 1000 units tablet Commonly known as: VITAMIN D Take 1,000 Units by mouth daily.   Diclofenac Sodium 1.6 % Gel Apply to the affected finger four times a day   gabapentin 800 MG tablet Commonly known as: NEURONTIN TAKE 1 TABLET BY MOUTH 4 TIMES  DAILY   omeprazole 40 MG capsule Commonly known as: PRILOSEC TAKE ONE CAPSULE BY MOUTH TWICE A DAY   rosuvastatin 10 MG tablet Commonly known as: CRESTOR Take 1 tablet (10 mg total) by mouth daily.   [place] 4 % Crea Generic drug: Fluorouracil Apply topically.   triamcinolone cream 0.1 % Commonly known as: KENALOG Apply 1 Application topically 2 (two) times daily.         Objective:   BP 135/85   Pulse 61   Temp (!) 97.4 F (36.3 C)   Ht 5\' 10"  (1.778 m)   Wt 187 lb 9.6 oz (85.1 kg)   SpO2 100%   BMI 26.92 kg/m   Wt Readings from Last 3 Encounters:  12/17/22 187 lb 9.6 oz (85.1 kg)  12/12/22 180 lb (81.6 kg)  08/20/22 185 lb 9.6 oz (84.2 kg)  Physical Exam Vitals and nursing note reviewed.  Constitutional:      General: He is not in acute distress.    Appearance: He is well-developed. He is not diaphoretic.  Eyes:     General: No scleral icterus.    Conjunctiva/sclera: Conjunctivae normal.  Neck:     Thyroid: No thyromegaly.  Cardiovascular:     Rate and Rhythm: Normal rate and regular rhythm.     Heart sounds: Normal heart sounds. No murmur heard. Pulmonary:     Effort: Pulmonary effort is normal. No respiratory distress.     Breath sounds: Normal breath sounds. No wheezing.  Musculoskeletal:        General: No swelling. Normal range of motion.     Cervical back: Neck supple.  Lymphadenopathy:     Cervical: No cervical adenopathy.  Skin:    General: Skin is warm and dry.     Findings: No rash.  Neurological:     Mental Status: He is  alert and oriented to person, place, and time.     Coordination: Coordination normal.  Psychiatric:        Behavior: Behavior normal.       Assessment & Plan:   Problem List Items Addressed This Visit       Respiratory   Sarcoidosis of lung ([place])     Digestive   GERD   Relevant Medications   omeprazole (PRILOSEC) 40 MG capsule     Other   HLD (hyperlipidemia) - Primary   Other Visit Diagnoses     Elevated serum creatinine         Continue current medicine.  No changes.  Follow up plan: Return in about 6 months (around 06/18/2023), or if symptoms worsen or fail to improve, for Physical exam and hyperlipidemia and GERD.  Counseling provided for all of the vaccine components No orders of the defined types were placed in this encounter.   [name], MD [facility] 12/17/2022, 10:42 AM

## 2022-12-18 DIAGNOSIS — M65332 Trigger finger, left middle finger: Secondary | ICD-10-CM | POA: Diagnosis not present

## 2023-01-11 DIAGNOSIS — C61 Malignant neoplasm of prostate: Secondary | ICD-10-CM | POA: Diagnosis not present

## 2023-01-14 ENCOUNTER — Encounter: Payer: Self-pay | Admitting: Neurology

## 2023-01-14 ENCOUNTER — Ambulatory Visit (INDEPENDENT_AMBULATORY_CARE_PROVIDER_SITE_OTHER): Payer: Medicare Other | Admitting: Neurology

## 2023-01-14 VITALS — BP 154/78 | HR 59 | Ht 70.0 in | Wt 188.0 lb

## 2023-01-14 DIAGNOSIS — M792 Neuralgia and neuritis, unspecified: Secondary | ICD-10-CM | POA: Diagnosis not present

## 2023-01-14 DIAGNOSIS — D8689 Sarcoidosis of other sites: Secondary | ICD-10-CM

## 2023-01-14 MED ORDER — MELATONIN 3 MG PO TABS: 3.0000 mg | ORAL_TABLET | Freq: Every day | ORAL | 0 refills | Status: DC

## 2023-01-14 NOTE — Progress Notes (Signed)
PATIENT: Philip Hernandez DOB: [birth year]   [facility] provider: Dr. Pearlean Brownie REASON FOR VISIT: Neuropathy HISTORY FROM: patient    Chief complaint: Chief Complaint  Patient presents with   Follow-up    RM 3 alone Pt is well and stable, reports neuropathy is about the same as last visit. still having some pain.        HISTORY OF PRESENT ILLNESS: Update 01/14/2023 : He returns for follow-up after last visit with Shanda Bumps nurse practitioner on 07/11/2022.  He continues to have persistent neuropathic pain.  States he has constant tingling numbness and burning electric.  He remains on gabapentin 800 mg 4 times daily which is tolerating well but does make tired and sleepy.  He has previous failed tolerating Topamax, amitriptyline and carbamazepine.  He does state his balance is okay though he is occasionally off balance and bumping the walls or doors but cannot catch himself.  He has had no major falls or injuries.  Is not been using the cane or walker.  Patient states he has not been sleeping too well.  Does not take any sleeping aids.  He feels his difficulties are not related to his burning pain.Lab work from 12/10/2022 showed low density cholesterol was nearly optimal at 75.  CMP was significant only for mildly elevated creatinine of 1.35 which his primary physician felt was related to dehydration.  Increase his water intake.  No other new complaints. Update 07/11/2022 JM: Patient returns for neuropathic pain follow-up visit after prior visit with Dr. Pearlean Brownie 3 months ago.  He gradually tapered off carbamazepine since prior visit without any significant changes of neuropathic pain.  He remains on gabapentin 800 mg 4 times daily.  Reports having good days and bad days.  Denies any worsening of symptoms overall since prior visit.  Continues to have neuropathy in upper and lower extremities bilaterally from wrists distally and ankles distally.  He also mentions locking of his left middle finger, present over the  past couple weeks and gradually worsening, denies any specific injury.      History provided for reference purposes only Update 04/10/2022 Dr. Pearlean Brownie He returns for follow-up after last visit with Shanda Bumps nurse practitioner in 6 months ago.  Patient had complained of persistent pain in the legs and cramping particularly at night so carbamazepine was added.  He is currently taking 100 mg 3 times daily and is not sure if it is really helping him.  However he admits that at night he does not have to get up so often and is sleeping better.  Towards the end of March she developed itchy painful rash in both upper extremities over the forearms left more than right.  He was not sure if this was related to exposure to sun or from carbamazepine.  He applied lotion for few days without any benefit but then the rash went away by itself.  He was seen by dermatologist who gave him a lotion but patient did not apply it.  Patient is still not entirely convinced that this was not related to the carbamazepine.  He is tolerating the current dose of 100 mg 3 times daily quite well without any obvious side effects.  He had lab work done yesterday which showed carbamazepine levels to be normal at 6.2.  CBC and CMP were unremarkable with serum sodium being on the low side 134.  Patient remains on gabapentin 804 times daily which is tolerating well without side effects.  He has no new complaints today.  Update 10/03/2021 JM: Returns for 45-month neuropathy follow-up.  Remains on gabapentin 800 mg 4x daily. Does report consistent pain during the day which has been stable but now worse at night with increased pain and cramping. Also reports increased LUE cramping (from elbow to finger) and continued occasional b/l hand cramping.  No further concerns at this time.  Update 03/22/2021 JM: Philip Hernandez returns for 56-month neuropathy follow-up.  Reports neuropathy has been stable without worsening.  Continues gabapentin 800mg  4 times daily  tolerating without side effects.  He does continue to experience pain daily which can interfere with daily activity and functioning but does notice worsening if he misses gabapentin dosage.  He tries to stay active as tolerated.  He does have occasional bilateral foot cramping which may last 15 to 30 seconds and then subside -this is been an ongoing symptom and not worsening.  No new concerns at this time.  Update 09/20/2020 JM: Philip Hernandez returns for 58-month follow-up regarding neuropathy.  Remains on gabapentin 800 mg 4 times daily tolerating well without side effects.  Neuropathy has been stable and gabapentin continues to provide benefit.  He will have occasional difficulty remembering to take all 4 doses and will experience worsening symptoms if he misses a dose.  He has been keeping active with routine activity and exercise in fact neuropathy symptoms can worsen after sitting for prolonged period of time. He will have occasional difficulty holding or picking up objects but relatively able to maintain ADLs and IADLs independently.  He does have occasional balance difficulties but overall stable.  Update 03/15/2020 JM:, Philip Hernandez returns for follow-up for neuropathy with paresthesias.  He has been stable since prior visit 6 months ago with unchanged symptoms of numbness/tingling and paresthesias in bilateral lower extremities.  He continues to experience good days and bad days.  Continues on gabapentin 800 mg 4 times daily tolerating dosage well. At times, may forget to take day time dosage and will notice worsening in symptoms.  He has not trialed additional dose of gabapentin with worsening symptoms as previously discussed with Dr. Pearlean Brownie. No further concerns at this time.  Update 09/15/2019 Dr. Pearlean Brownie : He returns for follow-up after last virtual video visit on 03/02/2019.  He continues to have tingling numbness and paresthesias in his feet which are more or less unchanged.  He has good days and bad days.   Physical activity does increase it.  Patient was started on Topamax previously and I will increase the dose to 50 mg twice daily but he had trouble tolerating this and stated that he could not focus with his eyes and had trouble reading and had to rub his eyes.  After taking it for 4 to 6 weeks he discontinued it.  He did note that increasing the dose of Topamax did help to some degree.  He remains on gabapentin and the current dose of 800 mg 4 times daily.  He has not tried Lyrica or Tegretol yet but does not want to try new medication.  Patient was diagnosed with Covid infection 3 weeks ago and did self quarantine himself and now has started going out last few days.  He is currently on prednisone taper as he also has sarcoidosis.  He does complain of feeling tired and having a minimum cough but is otherwise doing well.  His paresthesias are constant but is able to walk well he will have no problems with falls or balance issues.  Update via virtual visit 03/02/2019 Dr. Pearlean Brownie:  Philip Hernandez is seen today for virtual video office follow-up visit following last visit with Butch Penny, nurse practitioner on 08/25/2018.  He states he is doing about the same.  He still has paresthesias in his feet off and on.  He has good days and bad days.  He remains on Topamax 25 mg twice daily which is tolerating well but he is states if not sure if it is doing anything.  He also remains on gabapentin 800 mg 4 times daily and he does not miss a dose.  He continues to have gait and balance difficulties but is able to catch himself and has not fallen or hurt himself.  He does not use a cane or walker.  He recently got handicap parking sticker renewed by me.  He has no new neurological complaints.  Update 08/25/2018 MM: Philip Hernandez is a 67 year old male with a history of paresthesias in the feet.  He returns today for follow-up.  He remains on gabapentin 800 mg 4 times a day.  He states that there are times that he will miss doses and his  pain worsens.  Patient was also advised to start taking Topamax consistently.  He states that he is taking it more than he was but not necessarily consistently.  He states that he always has burning and tingling in the feet although most the time it is tolerable during the day unless he misses a dose of gabapentin.  He denies any significant changes with his gait or balance.  He does state that he is off balance at times.  Fortunately he has not had any recent falls.  He returns today for evaluation.   HISTORY 02/03/18: Philip Hernandez is a 67 year old male with a history of paresthesias in the feet.  He returns today for follow-up.  He is currently on gabapentin 800 mg 4 times a day.  He reports that this work well most the time.  He states recently he has noticed  more pain in the legs.  He states that he does not take Topamax consistently.  Reports that he went to [place] recently and he did take Topamax daily and he felt that his pain was under better control.  Denies any significant changes with his gait or balance.  Denies any falls.  He returns today for evaluation.   REVIEW OF SYSTEMS: Out of a complete 14 system review of symptoms, the patient complains only of the following symptoms, and all other reviewed systems are negative.  See HPI  ALLERGIES: Allergies  Allergen Reactions   Corn-Containing Products     HOME MEDICATIONS: Outpatient Medications Prior to Visit  Medication Sig Dispense Refill   acetaminophen (TYLENOL) 500 MG tablet Take 500 mg by mouth every 6 (six) hours as needed.     cetirizine (ZYRTEC) 10 MG tablet Take 10 mg by mouth daily.     cholecalciferol (VITAMIN D) 1000 UNITS tablet Take 1,000 Units by mouth daily.     gabapentin (NEURONTIN) 800 MG tablet TAKE 1 TABLET BY MOUTH 4 TIMES  DAILY 360 tablet 1   Multiple Vitamins-Minerals (AIRBORNE PO) Take by mouth daily.     omeprazole (PRILOSEC) 40 MG capsule TAKE ONE CAPSULE BY MOUTH TWICE A DAY 180 capsule 3   rosuvastatin  (CRESTOR) 10 MG tablet Take 1 tablet (10 mg total) by mouth daily. 90 tablet 3   Diclofenac Sodium 1.6 % GEL Apply to the affected finger four times a day (Patient not taking: Reported on 01/14/2023) 300 g  5   Fluorouracil (TOLAK) 4 % CREA Apply topically. (Patient not taking: Reported on 01/14/2023)     triamcinolone cream (KENALOG) 0.1 % Apply 1 Application topically 2 (two) times daily. (Patient not taking: Reported on 01/14/2023)     No facility-administered medications prior to visit.    PAST MEDICAL HISTORY: Past Medical History:  Diagnosis Date   Allergy    Arthritis    Cataract    Colon polyp    Colon polyps 2015   Cough    chronic sinus issues, GERD with poor dietary control and Pulmonary Sarcoid.    GERD (gastroesophageal reflux disease) 09/2010   Dr Arlyce Dice. Bx proven.  Deve abd pain in Jan 2012 following high dose steroids for sarcoid. s/p EGD and improvement in symptoms following PPI   Hypogonadism male    Kidney stone    Malignant neoplasm of prostate (HCC) 08/2009   Dr Heloise Purpura. s/p surgery dec 2010, hx of rnormal PSA  since then   Other and unspecified mycoses    Painful respiration    Peripheral neuropathy    Prostate cancer (HCC)    reoccurence (getting radiation 01-14-17 start).   Sarcoidosis 08/2010   stage 2 pulmonary, neck nodes, splenic granuloma   Skin cancer    Swelling, mass, or lump in head and neck     PAST SURGICAL HISTORY: Past Surgical History:  Procedure Laterality Date   HERNIA REPAIR  13 yrs ago   HERNIA REPAIR     INGUINAL HERNIA REPAIR Right 06/11/2022   Procedure: OPEN RIGHT INGUINAL HERNIA REPAIR WITH MESH;  Surgeon: Abigail Miyamoto, MD;  Location: [facility];  Service: General;  Laterality: Right;  LMA   LYMPH NODE BIOPSY  05/2010, 07/2010   nasal polyps  1984   NASAL SINUS SURGERY  2013   PROSTATE SURGERY  2010   radiation 2018   SHOULDER SURGERY Right 2009, 2010   x 2   SKIN CANCER EXCISION  11/16/14   UMBILICAL  HERNIA REPAIR N/A 06/11/2022   Procedure: HERNIA REPAIR UMBILICAL ADULT;  Surgeon: Abigail Miyamoto, MD;  Location: [facility];  Service: General;  Laterality: N/A;    FAMILY HISTORY: Family History  Problem Relation Age of Onset   Colon cancer Mother 4   Skin cancer Mother    Cancer Mother        colon   Colon cancer Brother 62   Skin cancer Brother    Cancer Brother        colon   Colon cancer Maternal Aunt        dx in her 53s   Cancer Maternal Aunt        "cancer of male organs" dx in her 62s   Breast cancer Maternal Aunt 66       bilateral, dx again at 62   Colon cancer Maternal Aunt 35   Stomach cancer Maternal Aunt 48   Dementia Father    Stomach cancer Maternal Uncle 47   Skin cancer Paternal Aunt    Prostate cancer Paternal Uncle 45   Cancer Paternal Uncle        prostate   Stomach cancer Paternal Grandmother        dx in her 43s   Stroke Paternal Grandfather    Colon cancer Maternal Aunt 68   Prostate cancer Paternal Uncle    Cancer Paternal Uncle        prostate   Prostate cancer Paternal Uncle  Kidney cancer Paternal Uncle    Lung cancer Paternal Uncle    Cancer Paternal Uncle        prostate   Lung cancer Paternal Uncle    Cancer Paternal Uncle        unknown   Cancer Paternal Uncle        prostate   Cancer Cousin        paternal cousin with cancer NOS   Cancer Cousin        paternal cousin with cancer NOS   Esophageal cancer Neg Hx    Rectal cancer Neg Hx     SOCIAL HISTORY: Social History   Socioeconomic History   Marital status: Married    Spouse name: Not on file   Number of children: 2   Years of education: Not on file   Highest education level: Not on file  Occupational History   Occupation: unemployed  Tobacco Use   Smoking status: Never   Smokeless tobacco: Never  Vaping Use   Vaping Use: Never used  Substance and Sexual Activity   Alcohol use: No   Drug use: No   Sexual activity: Yes  Other Topics  Concern   Not on file  Social History Narrative   Married   2 children   Social Determinants of Health   Financial Resource Strain: Low Risk  (12/12/2022)   Overall Financial Resource Strain (CARDIA)    Difficulty of Paying Living Expenses: Not hard at all  Food Insecurity: No Food Insecurity (12/12/2022)   Hunger Vital Sign    Worried About Running Out of Food in the Last Year: Never true    Ran Out of Food in the Last Year: Never true  Transportation Needs: No Transportation Needs (12/12/2022)   PRAPARE - Administrator, Civil Service (Medical): No    Lack of Transportation (Non-Medical): No  Physical Activity: Inactive (12/12/2022)   Exercise Vital Sign    Days of Exercise per Week: 0 days    Minutes of Exercise per Session: 0 min  Stress: No Stress Concern Present (12/12/2022)   [employer] of Occupational Health - Occupational Stress Questionnaire    Feeling of Stress : Not at all  Social Connections: Moderately Integrated (12/12/2022)   Social Connection and Isolation Panel [NHANES]    Frequency of Communication with Friends and Family: More than three times a week    Frequency of Social Gatherings with Friends and Family: More than three times a week    Attends Religious Services: 1 to 4 times per year    Active Member of [employer] or Organizations: No    Attends Banker Meetings: Never    Marital Status: Married  Catering manager Violence: Not At Risk (12/12/2022)   Humiliation, Afraid, Rape, and Kick questionnaire    Fear of Current or Ex-Partner: No    Emotionally Abused: No    Physically Abused: No    Sexually Abused: No      PHYSICAL EXAM  Vitals:   01/14/23 1300  BP: (!) 154/78  Pulse: (!) 59  Weight: 188 lb (85.3 kg)  Height: 5\' 10"  (1.778 m)      Body mass index is 26.98 kg/m.  Generalized: Pleasant middle-aged Caucasian male, in no acute distress   Neurological examination  Mentation: Alert oriented to time, place,  history taking. Follows all commands speech and language fluent Cranial nerve II-XII: Pupils were equal round reactive to light. Extraocular movements were full, visual field were full  on confrontational test. Facial sensation and strength were normal. Uvula tongue midline. Head turning and shoulder shrug  were normal and symmetric. Motor: The motor testing reveals 5 over 5 strength of all 4 extremities. Good symmetric motor tone is noted throughout.  Sensory: Sensory testing decreased vibratory sensation distal bilateral lower extremities.. No evidence of extinction is noted.  Slight hyperesthesia over toes bilaterally. Decreased sensation bilateral hands distally to pin prick, vibration and temperature.  Subjective numbness BLE stocking distribution Coordination: Cerebellar testing reveals good finger-nose-finger and heel-to-shin bilaterally.  Gait and station: Gait is normal. Tandem gait is slightly unsteady.  Romberg is negative. No drift is seen.  Reflexes: Deep tendon reflexes are symmetric and normal bilaterally.     DIAGNOSTIC DATA (LABS, IMAGING, TESTING) - I reviewed patient records, labs, notes, testing and imaging myself where available.  Lab Results  Component Value Date   WBC 6.6 12/10/2022   HGB 13.9 12/10/2022   HCT 39.6 12/10/2022   MCV 86 12/10/2022   PLT 137 (L) 12/10/2022      Component Value Date/Time   NA 142 12/10/2022 1242   K 5.3 (H) 12/10/2022 1242   CL 105 12/10/2022 1242   CO2 25 12/10/2022 1242   GLUCOSE 98 12/10/2022 1242   GLUCOSE 80 03/26/2013 1220   BUN 16 12/10/2022 1242   CREATININE 1.29 (H) 12/10/2022 1242   CREATININE 1.15 03/26/2013 1220   CALCIUM 9.6 12/10/2022 1242   PROT 6.4 12/10/2022 1242   ALBUMIN 4.4 12/10/2022 1242   AST 26 12/10/2022 1242   ALT 28 12/10/2022 1242   ALKPHOS 80 12/10/2022 1242   BILITOT 0.9 12/10/2022 1242   GFRNONAA 71 06/07/2020 1421   GFRNONAA 71 03/26/2013 1220   GFRAA 82 06/07/2020 1421   GFRAA 82 03/26/2013  1220   Lab Results  Component Value Date   CHOL 134 12/10/2022   HDL 36 (L) 12/10/2022   LDLCALC 75 12/10/2022   TRIG 125 12/10/2022   CHOLHDL 3.7 12/10/2022   No results found for: "HGBA1C" No results found for: "VITAMINB12" Lab Results  Component Value Date   TSH 2.360 12/10/2022    01/08/13 NCV/EMG Nerve conduction studies done on both lower extremities and the left upper extremity shows evidence of primarily motor involvement of the nerves in the legs, normal on the left arm. EMG evaluation of the left lower extremity shows mild primarily distal acute and chronic denervation, consistent with a peripheral neuropathy.  Patient continues to have neuropathic pain despite being on a big dose of gabapentin and has had trouble tolerating other medications in the past      ASSESSMENT AND PLAN 67 y.o. year old male  has a past medical history of Allergy, Arthritis, Cataract, Colon polyp, Colon polyps (2015), Cough, GERD (gastroesophageal reflux disease) (09/2010), Hypogonadism male, Kidney stone, Malignant neoplasm of prostate (HCC) (08/2009), Other and unspecified mycoses, Painful respiration, Peripheral neuropathy, Prostate cancer (HCC), Sarcoidosis (08/2010), Skin cancer, and Swelling, mass, or lump in head and neck. here with : He continues to have neuropathic pain despite being on high-dose of gabapentin   I had a long discussion with the patient with regards to his neuropathic pain from his neuropathy which still bothers him but tolerable on the current dose of gabapentin 800 mg 4 times daily.  He has previously tried but not tolerated Topamax, carbamazepine and amitriptyline.  I also recommended he improve his sleep hygiene and try taking melatonin 5 mg at sunset if needed use Tylenol PM at bedtime to  help with sleep.  Will return for follow-up in the future in 6 months with Shanda Bumps nurse practitioner or call earlier if necessary. Greater than 50% time during this 35-minute visit was  spent in counseling and coordination of care about his neuropathic pain and neuropathy and discussing treatment options and answering questions. Delia Heady, MD  [facility] [address] [place], [place] [address]  Phone [phone] Fax [phone] Note: This document was prepared with digital dictation and possible smart phrase technology. Any transcriptional errors that result from this process are unintentional.

## 2023-01-14 NOTE — Patient Instructions (Signed)
I had a long discussion with the patient with regards to his neuropathic pain from his neuropathy which still bothers him but tolerable on the current dose of gabapentin 800 mg 4 times daily.  He has previously tried but not tolerated Topamax, carbamazepine and amitriptyline.  I also recommended he improve his sleep hygiene and try taking melatonin 5 mg at [facility] if needed use Tylenol PM at bedtime to help with sleep.  Will return for follow-up in the future in 6 months with Shanda Bumps nurse practitioner or call earlier if necessary.

## 2023-01-18 DIAGNOSIS — C61 Malignant neoplasm of prostate: Secondary | ICD-10-CM | POA: Diagnosis not present

## 2023-03-08 ENCOUNTER — Other Ambulatory Visit: Payer: Self-pay | Admitting: Adult Health

## 2023-04-01 DIAGNOSIS — D225 Melanocytic nevi of trunk: Secondary | ICD-10-CM | POA: Diagnosis not present

## 2023-04-01 DIAGNOSIS — L821 Other seborrheic keratosis: Secondary | ICD-10-CM | POA: Diagnosis not present

## 2023-04-01 DIAGNOSIS — L578 Other skin changes due to chronic exposure to nonionizing radiation: Secondary | ICD-10-CM | POA: Diagnosis not present

## 2023-04-01 DIAGNOSIS — Z08 Encounter for follow-up examination after completed treatment for malignant neoplasm: Secondary | ICD-10-CM | POA: Diagnosis not present

## 2023-04-01 DIAGNOSIS — L570 Actinic keratosis: Secondary | ICD-10-CM | POA: Diagnosis not present

## 2023-04-01 DIAGNOSIS — I872 Venous insufficiency (chronic) (peripheral): Secondary | ICD-10-CM | POA: Diagnosis not present

## 2023-04-01 DIAGNOSIS — L814 Other melanin hyperpigmentation: Secondary | ICD-10-CM | POA: Diagnosis not present

## 2023-04-01 DIAGNOSIS — Z85828 Personal history of other malignant neoplasm of skin: Secondary | ICD-10-CM | POA: Diagnosis not present

## 2023-05-09 ENCOUNTER — Ambulatory Visit (INDEPENDENT_AMBULATORY_CARE_PROVIDER_SITE_OTHER): Payer: BC Managed Care – PPO | Admitting: Nurse Practitioner

## 2023-05-09 ENCOUNTER — Encounter: Payer: Self-pay | Admitting: Nurse Practitioner

## 2023-05-09 VITALS — BP 117/71 | HR 67 | Temp 97.8°F | Ht 70.0 in | Wt 186.8 lb

## 2023-05-09 DIAGNOSIS — W57XXXA Bitten or stung by nonvenomous insect and other nonvenomous arthropods, initial encounter: Secondary | ICD-10-CM | POA: Diagnosis not present

## 2023-05-09 DIAGNOSIS — S70362A Insect bite (nonvenomous), left thigh, initial encounter: Secondary | ICD-10-CM

## 2023-05-09 MED ORDER — DOXYCYCLINE HYCLATE 100 MG PO CAPS: 100.0000 mg | ORAL_CAPSULE | Freq: Two times a day (BID) | ORAL | 0 refills | Status: DC

## 2023-05-09 NOTE — Progress Notes (Signed)
   Acute Office Visit  Subjective:     Patient ID: Philip Hernandez, male    DOB: Jul 26, 1956, 67 y.o.   MRN: [id number]  Chief Complaint  Patient presents with   Tick Removal    Tick bite on left leg red, swollen, itches    HPI Philip Hernandez is 67 yrs old male seen today as an acute visit for tick bite Patient reports a tick bite on the left leg that occurred approximately 3 days ago during  working " I just found Monday night on my leg". The tick was attached for several hours before removal. Since then, the patient has noticed redness and swelling at the bite site but denies fever, rash, or other symptoms. The patient seeks evaluation to assess the risk of tick-borne diseases. Review of Systems  Constitutional:  Negative for fever.  HENT:  Negative for congestion.   Cardiovascular:  Negative for chest pain and leg swelling.  Skin:  Positive for rash.  Neurological:  Negative for dizziness and headaches.  Endo/Heme/Allergies:  Negative for environmental allergies and polydipsia. Does not bruise/bleed easily.   Negative unless indicated in HPI    Objective:    BP 117/71   Pulse 67   Temp 97.8 F (36.6 C) (Temporal)   Ht 5\' 10"  (1.778 m)   Wt 186 lb 12.8 oz (84.7 kg)   SpO2 98%   BMI 26.80 kg/m  BP Readings from Last 3 Encounters:  05/09/23 117/71  01/14/23 (!) 154/78  12/17/22 135/85   Wt Readings from Last 3 Encounters:  05/09/23 186 lb 12.8 oz (84.7 kg)  01/14/23 188 lb (85.3 kg)  12/17/22 187 lb 9.6 oz (85.1 kg)      Physical Exam Vitals and nursing note reviewed.  Constitutional:      Appearance: Normal appearance.  Cardiovascular:     Rate and Rhythm: Normal rate and regular rhythm.  Pulmonary:     Effort: Pulmonary effort is normal.     Breath sounds: Normal breath sounds.  Skin:    General: Skin is warm and dry.     Findings: Erythema and rash present.          Comments: 3x4 cm  Neurological:     Mental Status: He is alert.     No results found  for any visits on 05/09/23.      Assessment & Plan:  Tick bite of left thigh, initial encounter -     Alpha-Gal Panel -     Lyme Disease Serology w/Reflex -     Doxycycline Hyclate; Take 1 capsule (100 mg total) by mouth 2 (two) times daily.  Dispense: 20 capsule; Refill: 0   Philip Hernandez is a 67 yrs old male seen today for tick bites, no cute distress Doxy 100 mg BID for 10 days prophylaxis Alpha Gal and lyme order Client to avoid sunlight while on Doxy    The above assessment and management plan was discussed with the patient. The patient verbalized understanding of and has agreed to the management plan. Patient is aware to call the clinic if they develop any new symptoms or if symptoms persist or worsen. Patient is aware when to return to the clinic for a follow-up visit. Patient educated on when it is appropriate to go to the emergency department.  Return if symptoms worsen or fail to improve.  Arrie Aran Santa Lighter, DNP [facility] [address] [place], [place] [address] [phone]

## 2023-05-13 LAB — ALPHA-GAL PANEL
Allergen Lamb IgE: <0.1 kU/L
Beef IgE: <0.1 kU/L
IgE (Immunoglobulin E), Serum: 11 [IU]/mL (ref 6–495)
O215-IgE Alpha-Gal: <0.1 kU/L
Pork IgE: <0.1 kU/L

## 2023-05-13 LAB — LYME DISEASE SEROLOGY W/REFLEX: Lyme Total Antibody EIA: NEGATIVE

## 2023-05-17 ENCOUNTER — Other Ambulatory Visit: Payer: Self-pay | Admitting: Family Medicine

## 2023-06-11 ENCOUNTER — Telehealth: Payer: Self-pay | Admitting: Family Medicine

## 2023-06-11 DIAGNOSIS — Z0000 Encounter for general adult medical examination without abnormal findings: Secondary | ICD-10-CM

## 2023-06-11 DIAGNOSIS — E782 Mixed hyperlipidemia: Secondary | ICD-10-CM

## 2023-06-11 NOTE — Telephone Encounter (Signed)
Please add lab orders. Made lab appt for 9/26 and patient has cpe appt on 10/2

## 2023-06-11 NOTE — Telephone Encounter (Signed)
Lab orders placed.  

## 2023-06-13 ENCOUNTER — Other Ambulatory Visit: Payer: BC Managed Care – PPO

## 2023-06-13 DIAGNOSIS — Z0000 Encounter for general adult medical examination without abnormal findings: Secondary | ICD-10-CM | POA: Diagnosis not present

## 2023-06-13 DIAGNOSIS — E782 Mixed hyperlipidemia: Secondary | ICD-10-CM

## 2023-06-13 DIAGNOSIS — M65332 Trigger finger, left middle finger: Secondary | ICD-10-CM | POA: Diagnosis not present

## 2023-06-13 DIAGNOSIS — E875 Hyperkalemia: Secondary | ICD-10-CM | POA: Diagnosis not present

## 2023-06-13 LAB — CBC WITH DIFFERENTIAL/PLATELET
Basophils Absolute: 0.1 10*3/uL (ref 0.0–0.2)
Basos: 1 %
EOS (ABSOLUTE): 0.2 10*3/uL (ref 0.0–0.4)
Eos: 3 %
Hematocrit: 43.6 % (ref 37.5–51.0)
Hemoglobin: 14.3 g/dL (ref 13.0–17.7)
Immature Grans (Abs): 0 10*3/uL (ref 0.0–0.1)
Immature Granulocytes: 0 %
Lymphocytes Absolute: 1.9 10*3/uL (ref 0.7–3.1)
Lymphs: 28 %
MCH: 29.2 pg (ref 26.6–33.0)
MCHC: 32.8 g/dL (ref 31.5–35.7)
MCV: 89 fL (ref 79–97)
Monocytes Absolute: 0.7 10*3/uL (ref 0.1–0.9)
Monocytes: 10 %
Neutrophils Absolute: 3.9 10*3/uL (ref 1.4–7.0)
Neutrophils: 58 %
Platelets: 137 10*3/uL — ABNORMAL LOW (ref 150–450)
RBC: 4.9 x10E6/uL (ref 4.14–5.80)
RDW: 13 % (ref 11.6–15.4)
WBC: 6.8 10*3/uL (ref 3.4–10.8)

## 2023-06-13 LAB — CMP14+EGFR
ALT: 24 IU/L (ref 0–44)
AST: 22 IU/L (ref 0–40)
Albumin: 4.3 g/dL (ref 3.9–4.9)
Alkaline Phosphatase: 76 IU/L (ref 44–121)
BUN/Creatinine Ratio: 11 (ref 10–24)
BUN: 14 mg/dL (ref 8–27)
Bilirubin Total: 1.1 mg/dL (ref 0.0–1.2)
CO2: 24 mmol/L (ref 20–29)
Calcium: 9.3 mg/dL (ref 8.6–10.2)
Chloride: 105 mmol/L (ref 96–106)
Creatinine, Ser: 1.24 mg/dL (ref 0.76–1.27)
Globulin, Total: 2.1 g/dL (ref 1.5–4.5)
Glucose: 95 mg/dL (ref 70–99)
Potassium: 5.3 mmol/L — ABNORMAL HIGH (ref 3.5–5.2)
Sodium: 141 mmol/L (ref 134–144)
Total Protein: 6.4 g/dL (ref 6.0–8.5)
eGFR: 64 mL/min/{1.73_m2} (ref >59)

## 2023-06-13 LAB — LIPID PANEL
Chol/HDL Ratio: 3.6 ratio (ref 0.0–5.0)
Cholesterol, Total: 126 mg/dL (ref 100–199)
HDL: 35 mg/dL — ABNORMAL LOW (ref >39)
LDL Chol Calc (NIH): 70 mg/dL (ref 0–99)
Triglycerides: 112 mg/dL (ref 0–149)
VLDL Cholesterol Cal: 21 mg/dL (ref 5–40)

## 2023-06-13 LAB — PSA, TOTAL AND FREE

## 2023-06-19 ENCOUNTER — Ambulatory Visit: Payer: BC Managed Care – PPO | Admitting: Family Medicine

## 2023-06-19 ENCOUNTER — Encounter: Payer: Self-pay | Admitting: Family Medicine

## 2023-06-19 ENCOUNTER — Encounter (HOSPITAL_BASED_OUTPATIENT_CLINIC_OR_DEPARTMENT_OTHER): Payer: Self-pay | Admitting: Orthopedic Surgery

## 2023-06-19 ENCOUNTER — Other Ambulatory Visit: Payer: Self-pay

## 2023-06-19 VITALS — BP 134/75 | HR 59 | Ht 70.0 in | Wt 184.0 lb

## 2023-06-19 DIAGNOSIS — D860 Sarcoidosis of lung: Secondary | ICD-10-CM | POA: Diagnosis not present

## 2023-06-19 DIAGNOSIS — K219 Gastro-esophageal reflux disease without esophagitis: Secondary | ICD-10-CM | POA: Diagnosis not present

## 2023-06-19 DIAGNOSIS — Z0000 Encounter for general adult medical examination without abnormal findings: Secondary | ICD-10-CM

## 2023-06-19 DIAGNOSIS — Z23 Encounter for immunization: Secondary | ICD-10-CM | POA: Diagnosis not present

## 2023-06-19 DIAGNOSIS — Z0001 Encounter for general adult medical examination with abnormal findings: Secondary | ICD-10-CM | POA: Diagnosis not present

## 2023-06-19 DIAGNOSIS — E782 Mixed hyperlipidemia: Secondary | ICD-10-CM | POA: Diagnosis not present

## 2023-06-19 NOTE — Progress Notes (Signed)
BP 134/75   Pulse (!) 59   Ht 5\' 10"  (1.778 m)   Wt 184 lb (83.5 kg)   SpO2 97%   BMI 26.40 kg/m    Subjective:   Patient ID: Philip Hernandez, male    DOB: Nov 07, 1955, 67 y.o.   MRN: [id number]  HPI: Philip Hernandez is a 67 y.o. male presenting on 06/19/2023 for Medical Management of Chronic Issues (CPE)   HPI Physical exam Patient denies any chest pain, shortness of breath, headaches or vision issues, abdominal complaints, diarrhea, nausea, vomiting, or joint issues.  Patient has psychologist lung and sees specialist for this.  Hyperlipidemia Patient is coming in for recheck of his hyperlipidemia. The patient is currently taking Crestor. They deny any issues with myalgias or history of liver damage from it. They deny any focal numbness or weakness or chest pain.   GERD Patient is currently on omeprazole 40.  She denies any major symptoms or abdominal pain or belching or burping. She denies any blood in her stool or lightheadedness or dizziness.   Relevant past medical, surgical, family and social history reviewed and updated as indicated. Interim medical history since our last visit reviewed. Allergies and medications reviewed and updated.  Review of Systems  Constitutional:  Negative for chills and fever.  Eyes:  Negative for visual disturbance.  Respiratory:  Negative for shortness of breath and wheezing.   Cardiovascular:  Negative for chest pain and leg swelling.  Musculoskeletal:  Negative for back pain and gait problem.  Skin:  Negative for rash.  Neurological:  Negative for dizziness and light-headedness.  All other systems reviewed and are negative.   Per HPI unless specifically indicated above   Allergies as of 06/19/2023       Reactions   Corn-containing Products         Medication List        Accurate as of June 19, 2023 11:30 AM. If you have any questions, ask your nurse or doctor.          STOP taking these medications    doxycycline 100 MG  capsule Commonly known as: VIBRAMYCIN Stopped by: Elige Radon Akil Hoos       TAKE these medications    acetaminophen 500 MG tablet Commonly known as: TYLENOL Take 500 mg by mouth every 6 (six) hours as needed.   AIRBORNE PO Take by mouth daily.   cetirizine 10 MG tablet Commonly known as: ZYRTEC Take 10 mg by mouth daily.   cholecalciferol 1000 units tablet Commonly known as: VITAMIN D Take 1,000 Units by mouth daily.   gabapentin 800 MG tablet Commonly known as: NEURONTIN TAKE 1 TABLET BY MOUTH 4 TIMES  DAILY   melatonin 3 MG Tabs tablet Take 1 tablet (3 mg total) by mouth at bedtime.   omeprazole 40 MG capsule Commonly known as: PRILOSEC TAKE ONE CAPSULE BY MOUTH TWICE A DAY   rosuvastatin 10 MG tablet Commonly known as: CRESTOR TAKE ONE (1) TABLET BY MOUTH EVERY DAY         Objective:   BP 134/75   Pulse (!) 59   Ht 5\' 10"  (1.778 m)   Wt 184 lb (83.5 kg)   SpO2 97%   BMI 26.40 kg/m   Wt Readings from Last 3 Encounters:  06/19/23 184 lb (83.5 kg)  05/09/23 186 lb 12.8 oz (84.7 kg)  01/14/23 188 lb (85.3 kg)    Physical Exam Vitals and nursing note reviewed.  Constitutional:  General: He is not in acute distress.    Appearance: He is well-developed. He is not diaphoretic.  Eyes:     General: No scleral icterus.    Conjunctiva/sclera: Conjunctivae normal.  Neck:     Thyroid: No thyromegaly.  Cardiovascular:     Rate and Rhythm: Normal rate and regular rhythm.     Heart sounds: Normal heart sounds. No murmur heard. Pulmonary:     Effort: Pulmonary effort is normal. No respiratory distress.     Breath sounds: Normal breath sounds. No wheezing.  Musculoskeletal:        General: No swelling. Normal range of motion.     Cervical back: Neck supple.  Lymphadenopathy:     Cervical: No cervical adenopathy.  Skin:    General: Skin is warm and dry.     Findings: No rash.  Neurological:     Mental Status: He is alert and oriented to person,  place, and time.     Coordination: Coordination normal.  Psychiatric:        Behavior: Behavior normal.     Results for orders placed or performed in visit on 06/13/23  CBC with Differential/Platelet  Result Value Ref Range   WBC 6.8 3.4 - 10.8 x10E3/uL   RBC 4.90 4.14 - 5.80 x10E6/uL   Hemoglobin 14.3 13.0 - 17.7 g/dL   Hematocrit 65.7 84.6 - 51.0 %   MCV 89 79 - 97 fL   MCH 29.2 26.6 - 33.0 pg   MCHC 32.8 31.5 - 35.7 g/dL   RDW 96.2 95.2 - 84.1 %   Platelets 137 (L) 150 - 450 x10E3/uL   Neutrophils 58 Not Estab. %   Lymphs 28 Not Estab. %   Monocytes 10 Not Estab. %   Eos 3 Not Estab. %   Basos 1 Not Estab. %   Neutrophils Absolute 3.9 1.4 - 7.0 x10E3/uL   Lymphocytes Absolute 1.9 0.7 - 3.1 x10E3/uL   Monocytes Absolute 0.7 0.1 - 0.9 x10E3/uL   EOS (ABSOLUTE) 0.2 0.0 - 0.4 x10E3/uL   Basophils Absolute 0.1 0.0 - 0.2 x10E3/uL   Immature Granulocytes 0 Not Estab. %   Immature Grans (Abs) 0.0 0.0 - 0.1 x10E3/uL  CMP14+EGFR  Result Value Ref Range   Glucose 95 70 - 99 mg/dL   BUN 14 8 - 27 mg/dL   Creatinine, Ser 3.24 0.76 - 1.27 mg/dL   eGFR 64 >40 NU/UVO/5.36   BUN/Creatinine Ratio 11 10 - 24   Sodium 141 134 - 144 mmol/L   Potassium 5.3 (H) 3.5 - 5.2 mmol/L   Chloride 105 96 - 106 mmol/L   CO2 24 20 - 29 mmol/L   Calcium 9.3 8.6 - 10.2 mg/dL   Total Protein 6.4 6.0 - 8.5 g/dL   Albumin 4.3 3.9 - 4.9 g/dL   Globulin, Total 2.1 1.5 - 4.5 g/dL   Bilirubin Total 1.1 0.0 - 1.2 mg/dL   Alkaline Phosphatase 76 44 - 121 IU/L   AST 22 0 - 40 IU/L   ALT 24 0 - 44 IU/L  Lipid panel  Result Value Ref Range   Cholesterol, Total 126 100 - 199 mg/dL   Triglycerides 644 0 - 149 mg/dL   HDL 35 (L) >03 mg/dL   VLDL Cholesterol Cal 21 5 - 40 mg/dL   LDL Chol Calc (NIH) 70 0 - 99 mg/dL   Chol/HDL Ratio 3.6 0.0 - 5.0 ratio  PSA, total and free  Result Value Ref Range   Prostate Specific  Ag, Serum <0.1 0.0 - 4.0 ng/mL   PSA, Free <0.02 N/A ng/mL   PSA, Free Pct CANCELED %     Assessment & Plan:   Problem List Items Addressed This Visit       Respiratory   Sarcoidosis of lung (HCC)     Digestive   GERD     Other   HLD (hyperlipidemia)   Other Visit Diagnoses     Physical exam    -  Primary       Continue current medicine, blood work for the most part looks good.  He just needs to make sure he stays hydrated well with water.  Also said that he could take fish oils if he wanted to. Follow up plan: Return in about 6 months (around 12/18/2023), or if symptoms worsen or fail to improve, for cholesterol recheck.  Counseling provided for all of the vaccine components No orders of the defined types were placed in this encounter.   Arville Care, MD [facility] 06/19/2023, 11:30 AM

## 2023-06-26 ENCOUNTER — Ambulatory Visit (HOSPITAL_BASED_OUTPATIENT_CLINIC_OR_DEPARTMENT_OTHER): Payer: BC Managed Care – PPO | Admitting: Anesthesiology

## 2023-06-26 ENCOUNTER — Encounter (HOSPITAL_BASED_OUTPATIENT_CLINIC_OR_DEPARTMENT_OTHER)
Admission: RE | Disposition: A | Discharge status: Home / Self Care | Payer: Self-pay | Source: Ambulatory Visit | Attending: Orthopedic Surgery

## 2023-06-26 ENCOUNTER — Ambulatory Visit (HOSPITAL_BASED_OUTPATIENT_CLINIC_OR_DEPARTMENT_OTHER)
Admission: RE | Admit: 2023-06-26 | Discharge: 2023-06-26 | Disposition: A | Discharge status: Home / Self Care | Payer: BC Managed Care – PPO | Source: Ambulatory Visit | Attending: Orthopedic Surgery | Admitting: Orthopedic Surgery

## 2023-06-26 DIAGNOSIS — K219 Gastro-esophageal reflux disease without esophagitis: Secondary | ICD-10-CM | POA: Diagnosis not present

## 2023-06-26 DIAGNOSIS — Z01818 Encounter for other preprocedural examination: Secondary | ICD-10-CM

## 2023-06-26 DIAGNOSIS — M65842 Other synovitis and tenosynovitis, left hand: Secondary | ICD-10-CM | POA: Insufficient documentation

## 2023-06-26 DIAGNOSIS — M65332 Trigger finger, left middle finger: Secondary | ICD-10-CM | POA: Diagnosis not present

## 2023-06-26 HISTORY — DX: Personal history of urinary calculi: Z87.442

## 2023-06-26 HISTORY — PX: TRIGGER FINGER RELEASE: SHX641

## 2023-06-26 SURGERY — RELEASE, A1 PULLEY, FOR TRIGGER FINGER
Anesthesia: Monitor Anesthesia Care | Site: Hand | Laterality: Left

## 2023-06-26 MED ORDER — LACTATED RINGERS IV SOLN: INTRAVENOUS | Status: DC

## 2023-06-26 MED ORDER — MIDAZOLAM HCL 2 MG/2ML IJ SOLN
INTRAMUSCULAR | Status: AC
  Filled 2023-06-26: qty 2

## 2023-06-26 MED ORDER — BUPIVACAINE HCL (PF) 0.25 % IJ SOLN
INTRAMUSCULAR | Status: AC
  Filled 2023-06-26: qty 60

## 2023-06-26 MED ORDER — FENTANYL CITRATE (PF) 100 MCG/2ML IJ SOLN
INTRAMUSCULAR | Status: AC
  Filled 2023-06-26: qty 2

## 2023-06-26 MED ORDER — FENTANYL CITRATE (PF) 100 MCG/2ML IJ SOLN
INTRAMUSCULAR | Status: DC | PRN
  Administered 2023-06-26: 50 ug via INTRAVENOUS

## 2023-06-26 MED ORDER — 0.9 % SODIUM CHLORIDE (POUR BTL) OPTIME
TOPICAL | Status: DC | PRN
Start: 2023-06-26 — End: 2023-06-26
  Administered 2023-06-26: 50 mL

## 2023-06-26 MED ORDER — BUPIVACAINE HCL 0.25 % IJ SOLN
INTRAMUSCULAR | Status: DC | PRN
  Administered 2023-06-26: 7 mL

## 2023-06-26 MED ORDER — BUPIVACAINE-EPINEPHRINE (PF) 0.25% -1:200000 IJ SOLN
INTRAMUSCULAR | Status: AC
  Filled 2023-06-26: qty 60

## 2023-06-26 MED ORDER — PROPOFOL 10 MG/ML IV BOLUS
INTRAVENOUS | Status: AC
  Filled 2023-06-26: qty 20

## 2023-06-26 MED ORDER — ACETAMINOPHEN 500 MG PO TABS
ORAL_TABLET | ORAL | Status: AC
  Filled 2023-06-26: qty 2

## 2023-06-26 MED ORDER — FENTANYL CITRATE (PF) 100 MCG/2ML IJ SOLN: 25.0000 ug | INTRAMUSCULAR | Status: DC | PRN

## 2023-06-26 MED ORDER — ONDANSETRON HCL 4 MG/2ML IJ SOLN
INTRAMUSCULAR | Status: DC | PRN
  Administered 2023-06-26: 4 mg via INTRAVENOUS

## 2023-06-26 MED ORDER — LACTATED RINGERS IV SOLN
INTRAVENOUS | Status: DC | PRN
Start: 2023-06-26 — End: 2023-06-26

## 2023-06-26 MED ORDER — PROPOFOL 500 MG/50ML IV EMUL
INTRAVENOUS | Status: DC | PRN
  Administered 2023-06-26: 125 ug/kg/min via INTRAVENOUS

## 2023-06-26 MED ORDER — ACETAMINOPHEN 500 MG PO TABS
1000.0000 mg | ORAL_TABLET | Freq: Once | ORAL | Status: AC
  Administered 2023-06-26: 1000 mg via ORAL

## 2023-06-26 MED ORDER — MIDAZOLAM HCL 5 MG/5ML IJ SOLN
INTRAMUSCULAR | Status: DC | PRN
  Administered 2023-06-26: 2 mg via INTRAVENOUS

## 2023-06-26 SURGICAL SUPPLY — 32 items
APL PRP STRL LF DISP 70% ISPRP (MISCELLANEOUS) ×1
BLADE SURG 15 STRL LF DISP TIS (BLADE) ×2 IMPLANT
BLADE SURG 15 STRL SS (BLADE) ×2
BNDG CMPR 5X2 KNTD ELC UNQ LF (GAUZE/BANDAGES/DRESSINGS) ×1
BNDG CMPR 9X4 STRL LF SNTH (GAUZE/BANDAGES/DRESSINGS) ×1
BNDG ELASTIC 2INX 5YD STR LF (GAUZE/BANDAGES/DRESSINGS) ×2 IMPLANT
BNDG ESMARK 4X9 LF (GAUZE/BANDAGES/DRESSINGS) ×2 IMPLANT
CHLORAPREP W/TINT 26 (MISCELLANEOUS) ×2 IMPLANT
CORD BIPOLAR FORCEPS 12FT (ELECTRODE) ×2 IMPLANT
COVER BACK TABLE 60X90IN (DRAPES) ×2 IMPLANT
CUFF TOURN SGL QUICK 18X4 (TOURNIQUET CUFF) IMPLANT
CUFF TOURN SGL QUICK 24 (TOURNIQUET CUFF)
CUFF TRNQT CYL 24X4X16.5-23 (TOURNIQUET CUFF) IMPLANT
DRAPE EXTREMITY T 121X128X90 (DISPOSABLE) ×2 IMPLANT
DRAPE SURG 17X23 STRL (DRAPES) ×2 IMPLANT
GAUZE SPONGE 4X4 12PLY STRL (GAUZE/BANDAGES/DRESSINGS) IMPLANT
GAUZE XEROFORM 1X8 LF (GAUZE/BANDAGES/DRESSINGS) ×2 IMPLANT
GLOVE BIO SURGEON STRL SZ7 (GLOVE) ×2 IMPLANT
GLOVE BIOGEL PI IND STRL 7.0 (GLOVE) ×2 IMPLANT
GOWN STRL REUS W/ TWL LRG LVL3 (GOWN DISPOSABLE) ×4 IMPLANT
GOWN STRL REUS W/TWL LRG LVL3 (GOWN DISPOSABLE) ×2
NDL HYPO 25X1 1.5 SAFETY (NEEDLE) ×2 IMPLANT
NEEDLE HYPO 25X1 1.5 SAFETY (NEEDLE) ×1 IMPLANT
NS IRRIG 1000ML POUR BTL (IV SOLUTION) ×2 IMPLANT
PACK BASIN DAY SURGERY FS (CUSTOM PROCEDURE TRAY) ×2 IMPLANT
SHEET MEDIUM DRAPE 40X70 STRL (DRAPES) ×2 IMPLANT
SUT ETHILON 4 0 PS 2 18 (SUTURE) ×2 IMPLANT
SUT VIC AB 4-0 PS2 18 (SUTURE) IMPLANT
SYR BULB EAR ULCER 3OZ GRN STR (SYRINGE) ×2 IMPLANT
SYR CONTROL 10ML LL (SYRINGE) ×2 IMPLANT
TOWEL GREEN STERILE FF (TOWEL DISPOSABLE) ×2 IMPLANT
UNDERPAD 30X36 HEAVY ABSORB (UNDERPADS AND DIAPERS) ×2 IMPLANT

## 2023-06-26 NOTE — Interval H&P Note (Signed)
History and Physical Interval Note:  06/26/2023 8:35 AM  Philip Hernandez  has presented today for surgery, with the diagnosis of Let middle finger trigger finger.  The various methods of treatment have been discussed with the patient and family. After consideration of risks, benefits and other options for treatment, the patient has consented to  Procedure(s) with comments: RELEASE MIDDLE FINGER  TRIGGER FINGER/A-1 PULLEY (Left) - local 30 as a surgical intervention.  The patient's history has been reviewed, patient examined, no change in status, stable for surgery.  I have reviewed the patient's chart and labs.  Questions were answered to the patient's satisfaction.     Trejuan Matherne Adalae Baysinger

## 2023-06-26 NOTE — Anesthesia Postprocedure Evaluation (Signed)
Anesthesia Post Note  Patient: Philip Hernandez  Procedure(s) Performed: RELEASE MIDDLE FINGER  TRIGGER FINGER/A-1 PULLEY (Left: Hand)     Patient location during evaluation: PACU Anesthesia Type: MAC Level of consciousness: awake and alert Pain management: pain level controlled Vital Signs Assessment: post-procedure vital signs reviewed and stable Respiratory status: spontaneous breathing, nonlabored ventilation, respiratory function stable and patient connected to nasal cannula oxygen Cardiovascular status: stable and blood pressure returned to baseline Postop Assessment: no apparent nausea or vomiting Anesthetic complications: no  No notable events documented.  Last Vitals:  Vitals:   06/26/23 0930 06/26/23 1003  BP: 131/79 125/76  Pulse: (!) 58 (!) 56  Resp: 14 20  Temp:  36.6 C  SpO2: 94% 98%    Last Pain:  Vitals:   06/26/23 1003  TempSrc: Temporal  PainSc: 0-No pain                 [name] [name]

## 2023-06-26 NOTE — Op Note (Signed)
   Date of Surgery: 06/26/2023  INDICATIONS: Patient is a 67 y.o.-year-old male with stenosing tenosynovitis of the left middle finger that has failed conservative management.  Risks, benefits, and alternatives to surgery were again discussed with the patient in the preoperative area. The patient wishes to proceed with surgery.  Informed consent was signed after our discussion.   PREOPERATIVE DIAGNOSIS:  Left middle finger stenosing tenosynovitis  POSTOPERATIVE DIAGNOSIS: Same.  PROCEDURE:  Left middle finger A1 pulley release   SURGEON: Waylan Rocher, M.D.  ASSIST:   ANESTHESIA:  Local, MAC  IV FLUIDS AND URINE: See anesthesia.  ESTIMATED BLOOD LOSS: <5 mL.  IMPLANTS: * No implants in log *   DRAINS: None  COMPLICATIONS: None  DESCRIPTION OF PROCEDURE:  The patient was met in the preoperative holding area where the surgical site was marked and the informed consent form was signed.  The patient was then brought back to the operating room and remained on the stretcher.  A hand table was placed adjacent to the operative extremity and locked into place.  A tourniquet was placed on the left forearm.  A formal timeout was performed to confirm that this was the correct patient, surgical side, surgical site, and surgical procedure.  All were present and in agreement. Following formal timeout, a local block was performed using 10 mL of an equal mixture of 1% plain lidocaine and 0.25% plain marcaine.  The left upper extremity was then prepped and draped in the usual and sterile fashion.    Following a second formal timeout, the limb was exsanguinated and the tourniquet inflated to 250 mmHg.  A longitudinal incision was made over the A1 pulley.  The skin was incised.  Blunt dissection was used to identify the A1 pulley.  Two Ragnell retractors were placed on the radial and ulnar sides of the pulley to protect the respective neurovascular bundles.  A third Ragnell was placed at the distal  aspect of the wound.  The A1 pulley was clearly identified.  Under direct visualization, the pulley was entered sharply using a 15 blade.  Tenotomy scissors were used to complete the pulley release distally to the level of the A2 pulley.  The distal retractor was then placed in the proximal aspect of the wound.  Under direct visualization, the proximal aspect of the A1 pulley was completely released.   Following satisfactory A1 pulley release, the patient was reversed from sedation and asked to fully flex and extend the involved finger.  There was no catching or triggering present.  The tourniquet was let down and hemostasis achieved with direct pressure and bipolar electrocautery.  The wound was then thoroughly irrigated.  It was closed using 4-0 nylon sutures in a horizontal mattress fashion.  The wound was dressed with xeroform, 4x4, and an ace wrap.  The patient was reversed from sedation.  All counts were correct x 2 at the end of the procedure.  The patient was then taken to the PACU in stable condition.   POSTOPERATIVE PLAN: He will be discharged to home with appropriate pain medication and discharge instructions.  I'll see him back in 10-14 days for his first postop visit.   Waylan Rocher, MD 9:18 AM

## 2023-06-26 NOTE — Transfer of Care (Signed)
Immediate Anesthesia Transfer of Care Note  Patient: Philip Hernandez  Procedure(s) Performed: RELEASE MIDDLE FINGER  TRIGGER FINGER/A-1 PULLEY (Left: Hand)  Patient Location: PACU  Anesthesia Type:MAC  Level of Consciousness: awake, alert , and patient cooperative  Airway & Oxygen Therapy: Patient Spontanous Breathing  Post-op Assessment: Report given to RN and Post -op Vital signs reviewed and stable  Post vital signs: Reviewed and stable  Last Vitals:  Vitals Value Taken Time  BP 125/76 06/26/23 1003  Temp 36.6 C 06/26/23 1003  Pulse 56 06/26/23 1003  Resp 20 06/26/23 1003  SpO2 98 % 06/26/23 1003    Last Pain:  Vitals:   06/26/23 1003  TempSrc: Temporal  PainSc: 0-No pain      Patients Stated Pain Goal: 3 (06/26/23 0720)  Complications: No notable events documented.

## 2023-06-26 NOTE — Anesthesia Preprocedure Evaluation (Addendum)
Anesthesia Evaluation  Patient identified by MRN, date of birth, ID band Patient awake    Reviewed: Allergy & Precautions, NPO status , Patient's Chart, lab work & pertinent test results  Airway Mallampati: I  TM Distance: >3 FB Neck ROM: Full    Dental no notable dental hx. (+) Teeth Intact, Dental Advisory Given   Pulmonary neg pulmonary ROS sarcoidosis   Pulmonary exam normal breath sounds clear to auscultation       Cardiovascular Normal cardiovascular exam Rhythm:Regular Rate:Normal  HLD   Neuro/Psych negative neurological ROS  negative psych ROS   GI/Hepatic Neg liver ROS,GERD  ,,  Endo/Other  negative endocrine ROS    Renal/GU negative Renal ROS  negative genitourinary   Musculoskeletal  (+) Arthritis ,    Abdominal   Peds  Hematology negative hematology ROS (+)   Anesthesia Other Findings   Reproductive/Obstetrics                             Anesthesia Physical Anesthesia Plan  ASA: 2  Anesthesia Plan: MAC   Post-op Pain Management: Tylenol PO (pre-op)*   Induction: Intravenous  PONV Risk Score and Plan: 1 and Propofol infusion, Treatment may vary due to age or medical condition, Midazolam, Ondansetron and Dexamethasone  Airway Management Planned: Natural Airway  Additional Equipment:   Intra-op Plan:   Post-operative Plan:   Informed Consent: I have reviewed the patients History and Physical, chart, labs and discussed the procedure including the risks, benefits and alternatives for the proposed anesthesia with the patient or authorized representative who has indicated his/her understanding and acceptance.     Dental advisory given  Plan Discussed with: CRNA  Anesthesia Plan Comments:        Anesthesia Quick Evaluation

## 2023-06-26 NOTE — Discharge Instructions (Addendum)
Philip Hernandez, M.D. Hand Surgery  POST-OPERATIVE DISCHARGE INSTRUCTIONS   PRESCRIPTIONS: - You may have been given a prescription to be taken as directed for post-operative pain control.  You may also take over the counter ibuprofen/aleve and tylenol for pain. Take this as directed on the packaging. Do not exceed 3000 mg tylenol/acetaminophen in 24 hours.  Ibuprofen 600-800 mg (3-4) tablets by mouth every 6 hours as needed for pain.   OR  Aleve 2 tablets by mouth every 12 hours (twice daily) as needed for pain.   AND/OR  Tylenol 1000 mg (2 tablets) every 8 hours as needed for pain.  - Please use your pain medication carefully, as refills are limited and you may not be provided with one.  As stated above, please use over the counter pain medicine - it will also be helpful with decreasing your swelling.    ANESTHESIA: -After your surgery, post-surgical discomfort or pain is likely. This discomfort can last several days to a few weeks. At certain times of the day your discomfort may be more intense.   Did you receive a nerve block?   - A nerve block can provide pain relief for one hour to two days after your surgery. As long as the nerve block is working, you will experience little or no sensation in the area the surgeon operated on.  - As the nerve block wears off, you will begin to experience pain or discomfort. It is very important that you begin taking your prescribed pain medication before the nerve block fully wears off. Treating your pain at the first sign of the block wearing off will ensure your pain is better controlled and more tolerable when full-sensation returns. Do not wait until the pain is intolerable, as the medicine will be less effective. It is better to treat pain in advance than to try and catch up.   General Anesthesia:  If you did not receive a nerve block during your surgery, you will need to start taking your pain medication shortly after your surgery and  should continue to do so as prescribed by your surgeon.     ICE AND ELEVATION: - You may use ice for the first 48-72 hours, but it is not critical.   - Motion of your fingers is very important to decrease the swelling.  - Elevation, as much as possible for the next 48 hours, is critical for decreasing swelling as well as for pain relief. Elevation means when you are seated or lying down, you hand should be at or above your heart. When walking, the hand needs to be at or above the level of your elbow.  - If the bandage gets too tight, it may need to be loosened. Please contact our office and we will instruct you in how to do this.    SURGICAL BANDAGES:  - Keep your dressing and/or splint clean and dry at all times.  You can remove your dressing 7 days from now and change with a dry dressing or Band-Aids as needed thereafter. - You may place a plastic bag over your bandage to shower, but be careful, do not get your bandages wet.  - After the bandages have been removed, it is OK to get the stitches wet in a shower or with hand washing. Do Not soak or submerge the wound yet. Please do not use lotions or creams on the stitches.      HAND THERAPY:  - You may not need any. If you  do, we will begin this at your follow up visit in the clinic.    ACTIVITY AND WORK: - You are encouraged to move any fingers which are not in the bandage.  - Light use of the fingers is allowed to assist the other hand with daily hygiene and eating, but strong gripping or lifting is often uncomfortable and should be avoided.  - You might miss a variable period of time from work and hopefully this issue has been discussed prior to surgery. You may not do any heavy work with your affected hand for about 2 weeks.    [facility] [address] Floor, [address] [place], [place] [address] [phone]    Post Anesthesia Home Care Instructions  Activity: Get plenty of rest for the remainder of the day. A  responsible individual must stay with you for 24 hours following the procedure.  For the next 24 hours, DO NOT: -Drive a car -Advertising copywriter -Drink alcoholic beverages -Take any medication unless instructed by your physician -Make any legal decisions or sign important papers.  Meals: Start with liquid foods such as gelatin or soup. Progress to regular foods as tolerated. Avoid greasy, spicy, heavy foods. If nausea and/or vomiting occur, drink only clear liquids until the nausea and/or vomiting subsides. Call your physician if vomiting continues.  Special Instructions/Symptoms: Your throat may feel dry or sore from the anesthesia or the breathing tube placed in your throat during surgery. If this causes discomfort, gargle with warm salt water. The discomfort should disappear within 24 hours.  Can take tylenol after 2 pm if needed

## 2023-06-26 NOTE — H&P (Signed)
HAND SURGERY   HPI: Patient is a 67 y.o. male who presents with stenosing tenosynovitis of the left middle finger that has failed conservative management.  Patient denies any changes to their medical history or new systemic symptoms today.    Past Medical History:  Diagnosis Date   Allergy    Arthritis    Cataract    Colon polyp    Colon polyps 2015   Cough    chronic sinus issues, GERD with poor dietary control and Pulmonary Sarcoid.    GERD (gastroesophageal reflux disease) 09/2010   Dr Arlyce Dice. Bx proven.  Deve abd pain in Jan 2012 following high dose steroids for sarcoid. s/p EGD and improvement in symptoms following PPI   History of kidney stones    Hypogonadism male    Malignant neoplasm of prostate (HCC) 08/2009   Dr Heloise Purpura. s/p surgery dec 2010, hx of rnormal PSA  since then   Other and unspecified mycoses    Painful respiration    Peripheral neuropathy    Prostate cancer (HCC)    reoccurence (getting radiation 01-14-17 start).   Sarcoidosis 08/2010   stage 2 pulmonary, neck nodes, splenic granuloma   Skin cancer    Swelling, mass, or lump in head and neck    Past Surgical History:  Procedure Laterality Date   HERNIA REPAIR  13 yrs ago   HERNIA REPAIR     INGUINAL HERNIA REPAIR Right 06/11/2022   Procedure: OPEN RIGHT INGUINAL HERNIA REPAIR WITH MESH;  Surgeon: Abigail Miyamoto, MD;  Location: [facility];  Service: General;  Laterality: Right;  LMA   LYMPH NODE BIOPSY  05/2010, 07/2010   nasal polyps  1984   NASAL SINUS SURGERY  2013   PROSTATE SURGERY  2010   radiation 2018   SHOULDER SURGERY Right 2009, 2010   x 2   SKIN CANCER EXCISION  11/16/14   UMBILICAL HERNIA REPAIR N/A 06/11/2022   Procedure: HERNIA REPAIR UMBILICAL ADULT;  Surgeon: Abigail Miyamoto, MD;  Location: [facility];  Service: General;  Laterality: N/A;   Social History   Socioeconomic History   Marital status: Married    Spouse name: Not on file    Number of children: 2   Years of education: Not on file   Highest education level: Not on file  Occupational History   Occupation: unemployed  Tobacco Use   Smoking status: Never   Smokeless tobacco: Never  Vaping Use   Vaping status: Never Used  Substance and Sexual Activity   Alcohol use: No   Drug use: No   Sexual activity: Yes  Other Topics Concern   Not on file  Social History Narrative   Married   2 children   Social Determinants of Health   Financial Resource Strain: Low Risk  (12/12/2022)   Overall Financial Resource Strain (CARDIA)    Difficulty of Paying Living Expenses: Not hard at all  Food Insecurity: No Food Insecurity (12/12/2022)   Hunger Vital Sign    Worried About Running Out of Food in the Last Year: Never true    Ran Out of Food in the Last Year: Never true  Transportation Needs: No Transportation Needs (12/12/2022)   PRAPARE - Administrator, Civil Service (Medical): No    Lack of Transportation (Non-Medical): No  Physical Activity: Inactive (12/12/2022)   Exercise Vital Sign    Days of Exercise per Week: 0 days    Minutes of Exercise per Session:  0 min  Stress: No Stress Concern Present (12/12/2022)   [employer] of Occupational Health - Occupational Stress Questionnaire    Feeling of Stress : Not at all  Social Connections: Moderately Integrated (12/12/2022)   Social Connection and Isolation Panel [NHANES]    Frequency of Communication with Friends and Family: More than three times a week    Frequency of Social Gatherings with Friends and Family: More than three times a week    Attends Religious Services: 1 to 4 times per year    Active Member of [employer] or Organizations: No    Attends Banker Meetings: Never    Marital Status: Married   Family History  Problem Relation Age of Onset   Colon cancer Mother 64   Skin cancer Mother    Cancer Mother        colon   Colon cancer Brother 69   Skin cancer Brother    Cancer  Brother        colon   Colon cancer Maternal Aunt        dx in her 69s   Cancer Maternal Aunt        "cancer of male organs" dx in her 58s   Breast cancer Maternal Aunt 67       bilateral, dx again at 28   Colon cancer Maternal Aunt 35   Stomach cancer Maternal Aunt 26   Dementia Father    Stomach cancer Maternal Uncle 47   Skin cancer Paternal Aunt    Prostate cancer Paternal Uncle 36   Cancer Paternal Uncle        prostate   Stomach cancer Paternal Grandmother        dx in her 22s   Stroke Paternal Grandfather    Colon cancer Maternal Aunt 68   Prostate cancer Paternal Uncle    Cancer Paternal Uncle        prostate   Prostate cancer Paternal Uncle    Kidney cancer Paternal Uncle    Lung cancer Paternal Uncle    Cancer Paternal Uncle        prostate   Lung cancer Paternal Uncle    Cancer Paternal Uncle        unknown   Cancer Paternal Uncle        prostate   Cancer Cousin        paternal cousin with cancer NOS   Cancer Cousin        paternal cousin with cancer NOS   Esophageal cancer Neg Hx    Rectal cancer Neg Hx    - negative except otherwise stated in the family history section Allergies  Allergen Reactions   Corn-Containing Products    Prior to Admission medications   Medication Sig Start Date End Date Taking? Authorizing Provider  cetirizine (ZYRTEC) 10 MG tablet Take 10 mg by mouth daily.   Yes [provider]  cholecalciferol (VITAMIN D) 1000 UNITS tablet Take 1,000 Units by mouth daily.   Yes [provider]  gabapentin (NEURONTIN) 800 MG tablet TAKE 1 TABLET BY MOUTH 4 TIMES  DAILY 03/12/23  Yes McCue, Shanda Bumps, NP  Multiple Vitamins-Minerals (AIRBORNE PO) Take by mouth daily.   Yes [provider]  omeprazole (PRILOSEC) 40 MG capsule TAKE ONE CAPSULE BY MOUTH TWICE A DAY 12/17/22  Yes Dettinger, Elige Radon, MD  rosuvastatin (CRESTOR) 10 MG tablet TAKE ONE (1) TABLET BY MOUTH EVERY DAY 05/17/23  Yes Dettinger, Elige Radon, MD   acetaminophen (  TYLENOL) 500 MG tablet Take 500 mg by mouth every 6 (six) hours as needed.    [provider]  melatonin 3 MG TABS tablet Take 1 tablet (3 mg total) by mouth at bedtime. 01/14/23   Micki Riley, MD   No results found. - Positive ROS: All other systems have been reviewed and were otherwise negative with the exception of those mentioned in the HPI and as above.  Physical Exam: General: No acute distress, resting comfortably Cardiovascular: BUE warm and well perfused, normal rate Respiratory: Normal WOB on RA Skin: Warm and dry Neurologic: Sensation intact distally Psychiatric: Patient is at baseline mood and affect  Left Upper Extremity  TTP over the middle finger A1 pulley.   There is palpable/visible locking/catching of the middle finger with A/PROM.  Full and painless AROM of remaining fingers.  SILT m/u/r distribution.  Hand warm and well perfused w/ BCR.    Assessment: 67 yo M w/ left middle finger stenosing tenosynovitis.   Plan: OR today for left middle finger A1 pulley release. We again reviewed the risks of surgery which include bleeding, infection, damage to neurovascular structures, persistent symptoms, persistent swelling, need for additional surgery.  Informed consent was signed.  All questions were answered.   Marlyne Beards, M.D. [facility] 8:33 AM

## 2023-06-27 ENCOUNTER — Encounter (HOSPITAL_BASED_OUTPATIENT_CLINIC_OR_DEPARTMENT_OTHER): Payer: Self-pay | Admitting: Orthopedic Surgery

## 2023-07-16 ENCOUNTER — Encounter: Payer: Self-pay | Admitting: Adult Health

## 2023-07-16 ENCOUNTER — Ambulatory Visit (INDEPENDENT_AMBULATORY_CARE_PROVIDER_SITE_OTHER): Payer: Medicare Other | Admitting: Adult Health

## 2023-07-16 VITALS — BP 138/77 | HR 62 | Ht 71.0 in | Wt 187.0 lb

## 2023-07-16 DIAGNOSIS — M792 Neuralgia and neuritis, unspecified: Secondary | ICD-10-CM | POA: Diagnosis not present

## 2023-07-16 DIAGNOSIS — D8689 Sarcoidosis of other sites: Secondary | ICD-10-CM | POA: Diagnosis not present

## 2023-07-16 NOTE — Patient Instructions (Addendum)
Your Plan:  You will be scheduled to complete a repeat EMG/NCV to ensure you are not having any underlying carpal tunnel contributing to your symptoms   Continue gabapentin 800mg  4 times daily   Referral placed to [facility] occupational therapy in [place] - you will be called to schedule initial evaluation   Please call with any worsening symptoms   If you are interested in being seen at an academic center for further recommendations please let me know     Follow up in 6 months or call earlier if needed     Thank you for coming to see [place] at [facility]. I hope we have been able to provide you high quality care today.  You may receive a patient satisfaction survey over the next few weeks. We would appreciate your feedback and comments so that we may continue to improve ourselves and the health of our patients.

## 2023-07-16 NOTE — Progress Notes (Signed)
PATIENT: Philip Hernandez DOB: December 28, 1955   [facility] provider: Dr. Pearlean Brownie REASON FOR VISIT: Neuropathy HISTORY FROM: patient    Chief complaint: Chief Complaint  Patient presents with   Follow-up    Rm 3, here alone Pt is here for neuropathy follow up. Pt states he is doing well, states neuropathy has not changed, has stayed the same. States his hands are getting a bit weaker.       HISTORY OF PRESENT ILLNESS:    Update 07/16/2023 JM: Returns for follow-up visit unaccompanied.  Overall stable since prior visit.  Continues to have persistent neuropathic pain in bilateral upper and lower extremities distally without any changes since prior visit. He has noticed increased difficulty with fine motor control of hands, feels slight weakness of hands, has numbness and pain throughout hand but more severe in first 3 digits. Continues on gabapentin 800 mg 4 times daily, control pain well, can worsen if he misses a dose or late on a dose. Does fatigue quickly with little activity.  S/p trigger finger release surgery on 10/9, still has some swelling and not able to use fully or make a complete fist but has been gradually improving.      History provided for reference purposes only Update 01/14/2023 Dr. Pearlean Brownie: He returns for follow-up after last visit with Shanda Bumps nurse practitioner on 07/11/2022.  He continues to have persistent neuropathic pain.  States he has constant tingling numbness and burning electric.  He remains on gabapentin 800 mg 4 times daily which is tolerating well but does make tired and sleepy.  He has previous failed tolerating Topamax, amitriptyline and carbamazepine.  He does state his balance is okay though he is occasionally off balance and bumping the walls or doors but cannot catch himself.  He has had no major falls or injuries.  Is not been using the cane or walker.  Patient states he has not been sleeping too well.  Does not take any sleeping aids.  He feels his difficulties  are not related to his burning pain.Lab work from 12/10/2022 showed low density cholesterol was nearly optimal at 75.  CMP was significant only for mildly elevated creatinine of 1.35 which his primary physician felt was related to dehydration.  Increase his water intake.  No other new complaints.   Update 07/11/2022 JM: Patient returns for neuropathic pain follow-up visit after prior visit with Dr. Pearlean Brownie 3 months ago.  He gradually tapered off carbamazepine since prior visit without any significant changes of neuropathic pain.  He remains on gabapentin 800 mg 4 times daily.  Reports having good days and bad days.  Denies any worsening of symptoms overall since prior visit.  Continues to have neuropathy in upper and lower extremities bilaterally from wrists distally and ankles distally.  He also mentions locking of his left middle finger, present over the past couple weeks and gradually worsening, denies any specific injury.   Update 04/10/2022 Dr. Pearlean Brownie He returns for follow-up after last visit with Shanda Bumps nurse practitioner in 6 months ago.  Patient had complained of persistent pain in the legs and cramping particularly at night so carbamazepine was added.  He is currently taking 100 mg 3 times daily and is not sure if it is really helping him.  However he admits that at night he does not have to get up so often and is sleeping better.  Towards the end of March she developed itchy painful rash in both upper extremities over the forearms left more than  right.  He was not sure if this was related to exposure to sun or from carbamazepine.  He applied lotion for few days without any benefit but then the rash went away by itself.  He was seen by dermatologist who gave him a lotion but patient did not apply it.  Patient is still not entirely convinced that this was not related to the carbamazepine.  He is tolerating the current dose of 100 mg 3 times daily quite well without any obvious side effects.  He had lab work  done yesterday which showed carbamazepine levels to be normal at 6.2.  CBC and CMP were unremarkable with serum sodium being on the low side 134.  Patient remains on gabapentin 804 times daily which is tolerating well without side effects.  He has no new complaints today.  Update 10/03/2021 JM: Returns for 65-month neuropathy follow-up.  Remains on gabapentin 800 mg 4x daily. Does report consistent pain during the day which has been stable but now worse at night with increased pain and cramping. Also reports increased LUE cramping (from elbow to finger) and continued occasional b/l hand cramping.  No further concerns at this time.  Update 03/22/2021 JM: Philip Hernandez returns for 32-month neuropathy follow-up.  Reports neuropathy has been stable without worsening.  Continues gabapentin 800mg  4 times daily tolerating without side effects.  He does continue to experience pain daily which can interfere with daily activity and functioning but does notice worsening if he misses gabapentin dosage.  He tries to stay active as tolerated.  He does have occasional bilateral foot cramping which may last 15 to 30 seconds and then subside -this is been an ongoing symptom and not worsening.  No new concerns at this time.  Update 09/20/2020 JM: Philip Hernandez returns for 77-month follow-up regarding neuropathy.  Remains on gabapentin 800 mg 4 times daily tolerating well without side effects.  Neuropathy has been stable and gabapentin continues to provide benefit.  He will have occasional difficulty remembering to take all 4 doses and will experience worsening symptoms if he misses a dose.  He has been keeping active with routine activity and exercise in fact neuropathy symptoms can worsen after sitting for prolonged period of time. He will have occasional difficulty holding or picking up objects but relatively able to maintain ADLs and IADLs independently.  He does have occasional balance difficulties but overall stable.  Update 03/15/2020  JM:, Philip Hernandez returns for follow-up for neuropathy with paresthesias.  He has been stable since prior visit 6 months ago with unchanged symptoms of numbness/tingling and paresthesias in bilateral lower extremities.  He continues to experience good days and bad days.  Continues on gabapentin 800 mg 4 times daily tolerating dosage well. At times, may forget to take day time dosage and will notice worsening in symptoms.  He has not trialed additional dose of gabapentin with worsening symptoms as previously discussed with Dr. Pearlean Brownie. No further concerns at this time.  Update 09/15/2019 Dr. Pearlean Brownie : He returns for follow-up after last virtual video visit on 03/02/2019.  He continues to have tingling numbness and paresthesias in his feet which are more or less unchanged.  He has good days and bad days.  Physical activity does increase it.  Patient was started on Topamax previously and I will increase the dose to 50 mg twice daily but he had trouble tolerating this and stated that he could not focus with his eyes and had trouble reading and had to rub his eyes.  After taking it for 4 to 6 weeks he discontinued it.  He did note that increasing the dose of Topamax did help to some degree.  He remains on gabapentin and the current dose of 800 mg 4 times daily.  He has not tried Lyrica or Tegretol yet but does not want to try new medication.  Patient was diagnosed with Covid infection 3 weeks ago and did self quarantine himself and now has started going out last few days.  He is currently on prednisone taper as he also has sarcoidosis.  He does complain of feeling tired and having a minimum cough but is otherwise doing well.  His paresthesias are constant but is able to walk well he will have no problems with falls or balance issues.  Update via virtual visit 03/02/2019 Dr. Pearlean Brownie: Philip Hernandez is seen today for virtual video office follow-up visit following last visit with Butch Penny, nurse practitioner on 08/25/2018.  He  states he is doing about the same.  He still has paresthesias in his feet off and on.  He has good days and bad days.  He remains on Topamax 25 mg twice daily which is tolerating well but he is states if not sure if it is doing anything.  He also remains on gabapentin 800 mg 4 times daily and he does not miss a dose.  He continues to have gait and balance difficulties but is able to catch himself and has not fallen or hurt himself.  He does not use a cane or walker.  He recently got handicap parking sticker renewed by me.  He has no new neurological complaints.  Update 08/25/2018 MM: Philip Hernandez is a 67 year old male with a history of paresthesias in the feet.  He returns today for follow-up.  He remains on gabapentin 800 mg 4 times a day.  He states that there are times that he will miss doses and his pain worsens.  Patient was also advised to start taking Topamax consistently.  He states that he is taking it more than he was but not necessarily consistently.  He states that he always has burning and tingling in the feet although most the time it is tolerable during the day unless he misses a dose of gabapentin.  He denies any significant changes with his gait or balance.  He does state that he is off balance at times.  Fortunately he has not had any recent falls.  He returns today for evaluation.   HISTORY 02/03/18: Philip Hernandez is a 67 year old male with a history of paresthesias in the feet.  He returns today for follow-up.  He is currently on gabapentin 800 mg 4 times a day.  He reports that this work well most the time.  He states recently he has noticed  more pain in the legs.  He states that he does not take Topamax consistently.  Reports that he went to [place] recently and he did take Topamax daily and he felt that his pain was under better control.  Denies any significant changes with his gait or balance.  Denies any falls.  He returns today for evaluation.   REVIEW OF SYSTEMS: Out of a complete 14  system review of symptoms, the patient complains only of the following symptoms, and all other reviewed systems are negative.  See HPI  ALLERGIES: Allergies  Allergen Reactions   Corn-Containing Products     HOME MEDICATIONS: Outpatient Medications Prior to Visit  Medication Sig Dispense Refill   acetaminophen (  TYLENOL) 500 MG tablet Take 500 mg by mouth every 6 (six) hours as needed.     cetirizine (ZYRTEC) 10 MG tablet Take 10 mg by mouth daily.     cholecalciferol (VITAMIN D) 1000 UNITS tablet Take 1,000 Units by mouth daily.     gabapentin (NEURONTIN) 800 MG tablet TAKE 1 TABLET BY MOUTH 4 TIMES  DAILY 360 tablet 3   melatonin 3 MG TABS tablet Take 1 tablet (3 mg total) by mouth at bedtime. 1 tablet 0   Multiple Vitamins-Minerals (AIRBORNE PO) Take by mouth daily.     omeprazole (PRILOSEC) 40 MG capsule TAKE ONE CAPSULE BY MOUTH TWICE A DAY 180 capsule 3   rosuvastatin (CRESTOR) 10 MG tablet TAKE ONE (1) TABLET BY MOUTH EVERY DAY 90 tablet 3   No facility-administered medications prior to visit.    PAST MEDICAL HISTORY: Past Medical History:  Diagnosis Date   Allergy    Arthritis    Cataract    Colon polyp    Colon polyps 2015   Cough    chronic sinus issues, GERD with poor dietary control and Pulmonary Sarcoid.    GERD (gastroesophageal reflux disease) 09/2010   Dr Arlyce Dice. Bx proven.  Deve abd pain in Jan 2012 following high dose steroids for sarcoid. s/p EGD and improvement in symptoms following PPI   History of kidney stones    Hypogonadism male    Malignant neoplasm of prostate (HCC) 08/2009   Dr Heloise Purpura. s/p surgery dec 2010, hx of rnormal PSA  since then   Other and unspecified mycoses    Painful respiration    Peripheral neuropathy    Prostate cancer (HCC)    reoccurence (getting radiation 01-14-17 start).   Sarcoidosis 08/2010   stage 2 pulmonary, neck nodes, splenic granuloma   Skin cancer    Swelling, mass, or lump in head and neck     PAST  SURGICAL HISTORY: Past Surgical History:  Procedure Laterality Date   HERNIA REPAIR  13 yrs ago   HERNIA REPAIR     INGUINAL HERNIA REPAIR Right 06/11/2022   Procedure: OPEN RIGHT INGUINAL HERNIA REPAIR WITH MESH;  Surgeon: Abigail Miyamoto, MD;  Location: [facility];  Service: General;  Laterality: Right;  LMA   LYMPH NODE BIOPSY  05/2010, 07/2010   nasal polyps  1984   NASAL SINUS SURGERY  2013   PROSTATE SURGERY  2010   radiation 2018   SHOULDER SURGERY Right 2009, 2010   x 2   SKIN CANCER EXCISION  11/16/14   TRIGGER FINGER RELEASE Left 06/26/2023   Procedure: RELEASE MIDDLE FINGER  TRIGGER FINGER/A-1 PULLEY;  Surgeon: Marlyne Beards, MD;  Location: [facility];  Service: Orthopedics;  Laterality: Left;  local 30   UMBILICAL HERNIA REPAIR N/A 06/11/2022   Procedure: HERNIA REPAIR UMBILICAL ADULT;  Surgeon: Abigail Miyamoto, MD;  Location: [facility];  Service: General;  Laterality: N/A;    FAMILY HISTORY: Family History  Problem Relation Age of Onset   Colon cancer Mother 44   Skin cancer Mother    Cancer Mother        colon   Colon cancer Brother 35   Skin cancer Brother    Cancer Brother        colon   Colon cancer Maternal Aunt        dx in her 66s   Cancer Maternal Aunt        "cancer of male organs" dx  in her 25s   Breast cancer Maternal Aunt 66       bilateral, dx again at 93   Colon cancer Maternal Aunt 35   Stomach cancer Maternal Aunt 66   Dementia Father    Stomach cancer Maternal Uncle 47   Skin cancer Paternal Aunt    Prostate cancer Paternal Uncle 34   Cancer Paternal Uncle        prostate   Stomach cancer Paternal Grandmother        dx in her 75s   Stroke Paternal Grandfather    Colon cancer Maternal Aunt 68   Prostate cancer Paternal Uncle    Cancer Paternal Uncle        prostate   Prostate cancer Paternal Uncle    Kidney cancer Paternal Uncle    Lung cancer Paternal Uncle    Cancer Paternal Uncle         prostate   Lung cancer Paternal Uncle    Cancer Paternal Uncle        unknown   Cancer Paternal Uncle        prostate   Cancer Cousin        paternal cousin with cancer NOS   Cancer Cousin        paternal cousin with cancer NOS   Esophageal cancer Neg Hx    Rectal cancer Neg Hx     SOCIAL HISTORY: Social History   Socioeconomic History   Marital status: Married    Spouse name: Not on file   Number of children: 2   Years of education: Not on file   Highest education level: Not on file  Occupational History   Occupation: unemployed  Tobacco Use   Smoking status: Never   Smokeless tobacco: Never  Vaping Use   Vaping status: Never Used  Substance and Sexual Activity   Alcohol use: No   Drug use: No   Sexual activity: Yes  Other Topics Concern   Not on file  Social History Narrative   Married   2 children   Social Determinants of Health   Financial Resource Strain: Low Risk  (12/12/2022)   Overall Financial Resource Strain (CARDIA)    Difficulty of Paying Living Expenses: Not hard at all  Food Insecurity: No Food Insecurity (12/12/2022)   Hunger Vital Sign    Worried About Running Out of Food in the Last Year: Never true    Ran Out of Food in the Last Year: Never true  Transportation Needs: No Transportation Needs (12/12/2022)   PRAPARE - Administrator, Civil Service (Medical): No    Lack of Transportation (Non-Medical): No  Physical Activity: Inactive (12/12/2022)   Exercise Vital Sign    Days of Exercise per Week: 0 days    Minutes of Exercise per Session: 0 min  Stress: No Stress Concern Present (12/12/2022)   [employer] of Occupational Health - Occupational Stress Questionnaire    Feeling of Stress : Not at all  Social Connections: Moderately Integrated (12/12/2022)   Social Connection and Isolation Panel [NHANES]    Frequency of Communication with Friends and Family: More than three times a week    Frequency of Social Gatherings with  Friends and Family: More than three times a week    Attends Religious Services: 1 to 4 times per year    Active Member of [employer] or Organizations: No    Attends Banker Meetings: Never    Marital Status: Married  Intimate  Partner Violence: Not At Risk (12/12/2022)   Humiliation, Afraid, Rape, and Kick questionnaire    Fear of Current or Ex-Partner: No    Emotionally Abused: No    Physically Abused: No    Sexually Abused: No      PHYSICAL EXAM  Vitals:   07/16/23 1304  BP: 138/77  Pulse: 62  Weight: 187 lb (84.8 kg)  Height: 5\' 11"  (1.803 m)   Body mass index is 26.08 kg/m.  Generalized: Pleasant middle-aged Caucasian male, in no acute distress   Neurological examination  Mentation: Alert oriented to time, place, history taking. Follows all commands speech and language fluent Cranial nerve II-XII: Pupils were equal round reactive to light. Extraocular movements were full, visual field were full on confrontational test. Facial sensation and strength were normal. Uvula tongue midline. Head turning and shoulder shrug  were normal and symmetric. Motor: The motor testing reveals 5 over 5 strength of all 4 extremities except slight grip weakness bilaterally although difficulty fully testing left hand due to recent trigger finger release surgery Sensory: Sensory testing decreased vibratory sensation distal bilateral lower extremities.. No evidence of extinction is noted.  Slight hyperesthesia over toes bilaterally. Decreased sensation bilateral hands distally to pin prick, vibration and temperature.  Subjective numbness BLE stocking distribution Coordination: Cerebellar testing reveals good finger-nose-finger and heel-to-shin bilaterally.  Gait and station: Gait is normal. Tandem gait is slightly unsteady.  Romberg is negative. No drift is seen.  Reflexes: Deep tendon reflexes are symmetric and normal bilaterally.     DIAGNOSTIC DATA (LABS, IMAGING, TESTING) - I reviewed  patient records, labs, notes, testing and imaging myself where available.  Lab Results  Component Value Date   WBC 6.8 06/13/2023   HGB 14.3 06/13/2023   HCT 43.6 06/13/2023   MCV 89 06/13/2023   PLT 137 (L) 06/13/2023      Component Value Date/Time   NA 141 06/13/2023 0935   K 5.3 (H) 06/13/2023 0935   CL 105 06/13/2023 0935   CO2 24 06/13/2023 0935   GLUCOSE 95 06/13/2023 0935   GLUCOSE 80 03/26/2013 1220   BUN 14 06/13/2023 0935   CREATININE 1.24 06/13/2023 0935   CREATININE 1.15 03/26/2013 1220   CALCIUM 9.3 06/13/2023 0935   PROT 6.4 06/13/2023 0935   ALBUMIN 4.3 06/13/2023 0935   AST 22 06/13/2023 0935   ALT 24 06/13/2023 0935   ALKPHOS 76 06/13/2023 0935   BILITOT 1.1 06/13/2023 0935   GFRNONAA 71 06/07/2020 1421   GFRNONAA 71 03/26/2013 1220   GFRAA 82 06/07/2020 1421   GFRAA 82 03/26/2013 1220   Lab Results  Component Value Date   CHOL 126 06/13/2023   HDL 35 (L) 06/13/2023   LDLCALC 70 06/13/2023   TRIG 112 06/13/2023   CHOLHDL 3.6 06/13/2023   No results found for: "HGBA1C" No results found for: "VITAMINB12" Lab Results  Component Value Date   TSH 2.360 12/10/2022    01/08/13 NCV/EMG Nerve conduction studies done on both lower extremities and the left upper extremity shows evidence of primarily motor involvement of the nerves in the legs, normal on the left arm. EMG evaluation of the left lower extremity shows mild primarily distal acute and chronic denervation, consistent with a peripheral neuropathy. There is no evidence of nerve root involvement. EMG evaluation shows evidence of a mononeuropathy affecting the median nerve proximally, again without evidence of a cervical radiculopathy. In the appropriate clinical setting, the study could be consistent with Guillian-Barre syndrome, but some chronic features  of denervation also seen. Sarcoidosis may be another consideration, but no sensory involvement is noted.      ASSESSMENT AND PLAN 67 y.o. year  old male  has a past medical history of Allergy, Arthritis, Cataract, Colon polyp, Colon polyps (2015), Cough, GERD (gastroesophageal reflux disease) (09/2010), History of kidney stones, Hypogonadism male, Malignant neoplasm of prostate (HCC) (08/2009), Other and unspecified mycoses, Painful respiration, Peripheral neuropathy, Prostate cancer (HCC), Sarcoidosis (08/2010), Skin cancer, and Swelling, mass, or lump in head and neck. here with :   1.  Paresthesias in the lower extremities from small fiber neuropathy possibly related to sarcoidosis  -Greater difficulty with fine motor control of hands bilaterally and increased dysesthesias in first 3 digits bilaterally -Recommend repeat EMG/NCV - rule out CTS contributing to symptoms  -Recommended OT but patient wishes to complete EMG/NCV first -Continue gabapentin 800 mg 4 times daily - refill provided -discussed consider trial of duloxetine but declines interest at this time  -Previously tried: Topamax, carbamazepine and amitriptyline  -discussed further evaluation at academic center but declines interest at this time     Follow-up in 6 months or call earlier if needed   CC:  Dettinger, Elige Radon, MD    I spent 30 minutes of face-to-face and non-face-to-face time with patient.  This included previsit chart review, lab review, study review, order entry, electronic health record documentation, patient education and discussion regarding above diagnoses and treatment plan and answered all the questions to patient's satisfaction   Ihor Austin, [facility]  [facility] [address] [place], [place] [address]  Phone [phone] Fax [phone] Note: This document was prepared with digital dictation and possible smart phrase technology. Any transcriptional errors that result from this process are unintentional.

## 2023-07-17 ENCOUNTER — Telehealth: Payer: Self-pay | Admitting: Adult Health

## 2023-07-17 DIAGNOSIS — R29898 Other symptoms and signs involving the musculoskeletal system: Secondary | ICD-10-CM

## 2023-07-17 DIAGNOSIS — M792 Neuralgia and neuritis, unspecified: Secondary | ICD-10-CM

## 2023-07-17 DIAGNOSIS — D8689 Sarcoidosis of other sites: Secondary | ICD-10-CM

## 2023-07-17 NOTE — Addendum Note (Signed)
Addended by: Ihor Austin L on: 07/17/2023 04:25 PM   Modules accepted: Orders

## 2023-07-17 NOTE — Telephone Encounter (Signed)
OT referral sent to [facility] in [place], phone # [phone].

## 2023-07-17 NOTE — Telephone Encounter (Signed)
Faxed PT referral to [facility], phone # [phone].

## 2023-07-17 NOTE — Telephone Encounter (Signed)
New order placed as requested. Thank you

## 2023-07-17 NOTE — Telephone Encounter (Signed)
[facility] called and said this type of issue would be handled by their PT dept instead of OT. Can you place a new referral saying PT please?

## 2023-07-23 DIAGNOSIS — C61 Malignant neoplasm of prostate: Secondary | ICD-10-CM | POA: Diagnosis not present

## 2023-08-08 DIAGNOSIS — D8689 Sarcoidosis of other sites: Secondary | ICD-10-CM | POA: Diagnosis not present

## 2023-08-08 DIAGNOSIS — R2689 Other abnormalities of gait and mobility: Secondary | ICD-10-CM | POA: Diagnosis not present

## 2023-08-08 DIAGNOSIS — R29898 Other symptoms and signs involving the musculoskeletal system: Secondary | ICD-10-CM | POA: Diagnosis not present

## 2023-08-08 DIAGNOSIS — M792 Neuralgia and neuritis, unspecified: Secondary | ICD-10-CM | POA: Diagnosis not present

## 2023-08-21 DIAGNOSIS — M792 Neuralgia and neuritis, unspecified: Secondary | ICD-10-CM | POA: Diagnosis not present

## 2023-08-21 DIAGNOSIS — D8689 Sarcoidosis of other sites: Secondary | ICD-10-CM | POA: Diagnosis not present

## 2023-08-21 DIAGNOSIS — R2689 Other abnormalities of gait and mobility: Secondary | ICD-10-CM | POA: Diagnosis not present

## 2023-08-21 DIAGNOSIS — R29898 Other symptoms and signs involving the musculoskeletal system: Secondary | ICD-10-CM | POA: Diagnosis not present

## 2023-08-23 DIAGNOSIS — D8689 Sarcoidosis of other sites: Secondary | ICD-10-CM | POA: Diagnosis not present

## 2023-08-23 DIAGNOSIS — R2689 Other abnormalities of gait and mobility: Secondary | ICD-10-CM | POA: Diagnosis not present

## 2023-08-23 DIAGNOSIS — R29898 Other symptoms and signs involving the musculoskeletal system: Secondary | ICD-10-CM | POA: Diagnosis not present

## 2023-08-23 DIAGNOSIS — M792 Neuralgia and neuritis, unspecified: Secondary | ICD-10-CM | POA: Diagnosis not present

## 2023-08-30 DIAGNOSIS — R29898 Other symptoms and signs involving the musculoskeletal system: Secondary | ICD-10-CM | POA: Diagnosis not present

## 2023-08-30 DIAGNOSIS — M792 Neuralgia and neuritis, unspecified: Secondary | ICD-10-CM | POA: Diagnosis not present

## 2023-08-30 DIAGNOSIS — D8689 Sarcoidosis of other sites: Secondary | ICD-10-CM | POA: Diagnosis not present

## 2023-08-30 DIAGNOSIS — R2689 Other abnormalities of gait and mobility: Secondary | ICD-10-CM | POA: Diagnosis not present

## 2023-09-03 DIAGNOSIS — R29898 Other symptoms and signs involving the musculoskeletal system: Secondary | ICD-10-CM | POA: Diagnosis not present

## 2023-09-03 DIAGNOSIS — R2689 Other abnormalities of gait and mobility: Secondary | ICD-10-CM | POA: Diagnosis not present

## 2023-09-03 DIAGNOSIS — D8689 Sarcoidosis of other sites: Secondary | ICD-10-CM | POA: Diagnosis not present

## 2023-09-03 DIAGNOSIS — M792 Neuralgia and neuritis, unspecified: Secondary | ICD-10-CM | POA: Diagnosis not present

## 2023-09-04 DIAGNOSIS — M792 Neuralgia and neuritis, unspecified: Secondary | ICD-10-CM | POA: Diagnosis not present

## 2023-09-04 DIAGNOSIS — R29898 Other symptoms and signs involving the musculoskeletal system: Secondary | ICD-10-CM | POA: Diagnosis not present

## 2023-09-04 DIAGNOSIS — R2689 Other abnormalities of gait and mobility: Secondary | ICD-10-CM | POA: Diagnosis not present

## 2023-09-04 DIAGNOSIS — D8689 Sarcoidosis of other sites: Secondary | ICD-10-CM | POA: Diagnosis not present

## 2023-09-09 ENCOUNTER — Encounter: Payer: BC Managed Care – PPO | Admitting: Neurology

## 2023-09-10 DIAGNOSIS — R2689 Other abnormalities of gait and mobility: Secondary | ICD-10-CM | POA: Diagnosis not present

## 2023-09-10 DIAGNOSIS — D8689 Sarcoidosis of other sites: Secondary | ICD-10-CM | POA: Diagnosis not present

## 2023-09-10 DIAGNOSIS — R29898 Other symptoms and signs involving the musculoskeletal system: Secondary | ICD-10-CM | POA: Diagnosis not present

## 2023-09-10 DIAGNOSIS — M792 Neuralgia and neuritis, unspecified: Secondary | ICD-10-CM | POA: Diagnosis not present

## 2023-09-13 DIAGNOSIS — R2689 Other abnormalities of gait and mobility: Secondary | ICD-10-CM | POA: Diagnosis not present

## 2023-09-13 DIAGNOSIS — M792 Neuralgia and neuritis, unspecified: Secondary | ICD-10-CM | POA: Diagnosis not present

## 2023-09-13 DIAGNOSIS — R29898 Other symptoms and signs involving the musculoskeletal system: Secondary | ICD-10-CM | POA: Diagnosis not present

## 2023-09-13 DIAGNOSIS — D8689 Sarcoidosis of other sites: Secondary | ICD-10-CM | POA: Diagnosis not present

## 2023-09-16 DIAGNOSIS — D8689 Sarcoidosis of other sites: Secondary | ICD-10-CM | POA: Diagnosis not present

## 2023-09-16 DIAGNOSIS — R2689 Other abnormalities of gait and mobility: Secondary | ICD-10-CM | POA: Diagnosis not present

## 2023-09-16 DIAGNOSIS — M792 Neuralgia and neuritis, unspecified: Secondary | ICD-10-CM | POA: Diagnosis not present

## 2023-09-16 DIAGNOSIS — R29898 Other symptoms and signs involving the musculoskeletal system: Secondary | ICD-10-CM | POA: Diagnosis not present

## 2023-09-26 DIAGNOSIS — M792 Neuralgia and neuritis, unspecified: Secondary | ICD-10-CM | POA: Diagnosis not present

## 2023-09-26 DIAGNOSIS — R2689 Other abnormalities of gait and mobility: Secondary | ICD-10-CM | POA: Diagnosis not present

## 2023-09-26 DIAGNOSIS — D8689 Sarcoidosis of other sites: Secondary | ICD-10-CM | POA: Diagnosis not present

## 2023-09-26 DIAGNOSIS — R29898 Other symptoms and signs involving the musculoskeletal system: Secondary | ICD-10-CM | POA: Diagnosis not present

## 2023-09-30 DIAGNOSIS — R2689 Other abnormalities of gait and mobility: Secondary | ICD-10-CM | POA: Diagnosis not present

## 2023-09-30 DIAGNOSIS — D8689 Sarcoidosis of other sites: Secondary | ICD-10-CM | POA: Diagnosis not present

## 2023-09-30 DIAGNOSIS — R29898 Other symptoms and signs involving the musculoskeletal system: Secondary | ICD-10-CM | POA: Diagnosis not present

## 2023-09-30 DIAGNOSIS — M792 Neuralgia and neuritis, unspecified: Secondary | ICD-10-CM | POA: Diagnosis not present

## 2023-10-02 DIAGNOSIS — L570 Actinic keratosis: Secondary | ICD-10-CM | POA: Diagnosis not present

## 2023-10-02 DIAGNOSIS — Z85828 Personal history of other malignant neoplasm of skin: Secondary | ICD-10-CM | POA: Diagnosis not present

## 2023-10-02 DIAGNOSIS — Z08 Encounter for follow-up examination after completed treatment for malignant neoplasm: Secondary | ICD-10-CM | POA: Diagnosis not present

## 2023-10-03 DIAGNOSIS — D8689 Sarcoidosis of other sites: Secondary | ICD-10-CM | POA: Diagnosis not present

## 2023-10-03 DIAGNOSIS — M792 Neuralgia and neuritis, unspecified: Secondary | ICD-10-CM | POA: Diagnosis not present

## 2023-10-03 DIAGNOSIS — R29898 Other symptoms and signs involving the musculoskeletal system: Secondary | ICD-10-CM | POA: Diagnosis not present

## 2023-10-03 DIAGNOSIS — R2689 Other abnormalities of gait and mobility: Secondary | ICD-10-CM | POA: Diagnosis not present

## 2023-10-08 DIAGNOSIS — D8689 Sarcoidosis of other sites: Secondary | ICD-10-CM | POA: Diagnosis not present

## 2023-10-08 DIAGNOSIS — M792 Neuralgia and neuritis, unspecified: Secondary | ICD-10-CM | POA: Diagnosis not present

## 2023-10-08 DIAGNOSIS — R29898 Other symptoms and signs involving the musculoskeletal system: Secondary | ICD-10-CM | POA: Diagnosis not present

## 2023-10-08 DIAGNOSIS — R2689 Other abnormalities of gait and mobility: Secondary | ICD-10-CM | POA: Diagnosis not present

## 2023-11-08 DIAGNOSIS — M65332 Trigger finger, left middle finger: Secondary | ICD-10-CM | POA: Diagnosis not present

## 2023-12-18 ENCOUNTER — Ambulatory Visit (INDEPENDENT_AMBULATORY_CARE_PROVIDER_SITE_OTHER): Payer: BC Managed Care – PPO | Admitting: Family Medicine

## 2023-12-18 ENCOUNTER — Encounter: Payer: Self-pay | Admitting: Family Medicine

## 2023-12-18 VITALS — BP 159/79 | HR 57 | Ht 71.0 in | Wt 184.0 lb

## 2023-12-18 DIAGNOSIS — D860 Sarcoidosis of lung: Secondary | ICD-10-CM

## 2023-12-18 DIAGNOSIS — E782 Mixed hyperlipidemia: Secondary | ICD-10-CM | POA: Diagnosis not present

## 2023-12-18 DIAGNOSIS — K219 Gastro-esophageal reflux disease without esophagitis: Secondary | ICD-10-CM

## 2023-12-18 MED ORDER — OMEPRAZOLE 40 MG PO CPDR: DELAYED_RELEASE_CAPSULE | ORAL | 3 refills | Status: AC

## 2023-12-18 NOTE — Progress Notes (Signed)
 BP (!) 159/79   Pulse (!) 57   Ht 5\' 11"  (1.803 m)   Wt 184 lb (83.5 kg)   SpO2 100%   BMI 25.66 kg/m    Subjective:   Patient ID: Philip Hernandez, male    DOB: June 17, 1956, 68 y.o.   MRN: [id number]  HPI: Philip Hernandez is a 68 y.o. male presenting on 12/18/2023 for Medical Management of Chronic Issues and Hyperlipidemia   HPI GERD Patient is currently on omeprazole.  She denies any major symptoms or abdominal pain or belching or burping. She denies any blood in her stool or lightheadedness or dizziness.   Hyperlipidemia Patient is coming in for recheck of his hyperlipidemia. The patient is currently taking Crestor. They deny any issues with myalgias or history of liver damage from it. They deny any focal numbness or weakness or chest pain.   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  He does see pulmonology for this.  Relevant past medical, surgical, family and social history reviewed and updated as indicated. Interim medical history since our last visit reviewed. Allergies and medications reviewed and updated.  Review of Systems  Constitutional:  Negative for chills and fever.  Eyes:  Negative for discharge.  Respiratory:  Negative for shortness of breath and wheezing.   Cardiovascular:  Negative for chest pain and leg swelling.  Musculoskeletal:  Negative for back pain and gait problem.  Skin:  Negative for rash.  All other systems reviewed and are negative.   Per HPI unless specifically indicated above   Allergies as of 12/18/2023       Reactions   Corn-containing Products         Medication List        Accurate as of December 18, 2023 10:55 AM. If you have any questions, ask your nurse or doctor.          acetaminophen 500 MG tablet Commonly known as: TYLENOL Take 500 mg by mouth every 6 (six) hours as needed.   AIRBORNE PO Take by mouth daily.   cetirizine 10 MG tablet Commonly known  as: ZYRTEC Take 10 mg by mouth daily.   cholecalciferol 1000 units tablet Commonly known as: VITAMIN D Take 1,000 Units by mouth daily.   gabapentin 800 MG tablet Commonly known as: NEURONTIN TAKE 1 TABLET BY MOUTH 4 TIMES  DAILY   melatonin 3 MG Tabs tablet Take 1 tablet (3 mg total) by mouth at bedtime.   omeprazole 40 MG capsule Commonly known as: PRILOSEC TAKE ONE CAPSULE BY MOUTH TWICE A DAY   rosuvastatin 10 MG tablet Commonly known as: CRESTOR TAKE ONE (1) TABLET BY MOUTH EVERY DAY         Objective:   BP (!) 159/79   Pulse (!) 57   Ht 5\' 11"  (1.803 m)   Wt 184 lb (83.5 kg)   SpO2 100%   BMI 25.66 kg/m   Wt Readings from Last 3 Encounters:  12/18/23 184 lb (83.5 kg)  07/16/23 187 lb (84.8 kg)  06/26/23 182 lb 15.7 oz (83 kg)    Physical Exam Vitals and nursing note reviewed.  Constitutional:      General: He is not in acute distress.    Appearance: He is well-developed. He is not diaphoretic.  Eyes:     General: No scleral icterus.    Conjunctiva/sclera: Conjunctivae normal.  Neck:     Thyroid: No thyromegaly.  Cardiovascular:  Rate and Rhythm: Normal rate and regular rhythm.     Heart sounds: Normal heart sounds. No murmur heard. Pulmonary:     Effort: Pulmonary effort is normal. No respiratory distress.     Breath sounds: Normal breath sounds. No wheezing.  Musculoskeletal:        General: No swelling. Normal range of motion.     Cervical back: Neck supple.  Lymphadenopathy:     Cervical: No cervical adenopathy.  Skin:    General: Skin is warm and dry.     Findings: No rash.  Neurological:     Mental Status: He is alert and oriented to person, place, and time.     Coordination: Coordination normal.  Psychiatric:        Behavior: Behavior normal.       Assessment & Plan:   Problem List Items Addressed This Visit       Respiratory   Sarcoidosis of lung (HCC)   Relevant Orders   CBC with Differential/Platelet     Digestive    GERD   Relevant Medications   omeprazole (PRILOSEC) 40 MG capsule   Other Relevant Orders   CBC with Differential/Platelet     Other   HLD (hyperlipidemia) - Primary   Relevant Orders   CMP14+EGFR   Lipid panel    Blood pressure was slightly up today but he is little stressed but did not with insurance right before he was coming and we will do blood work today on the way out.   Follow up plan: Return in about 6 months (around 06/18/2024), or if symptoms worsen or fail to improve, for Physical exam and recheck hyperlipidemia and GERD.  Counseling provided for all of the vaccine components Orders Placed This Encounter  Procedures   CBC with Differential/Platelet   CMP14+EGFR   Lipid panel    Arville Care, MD [facility] 12/18/2023, 10:55 AM

## 2023-12-19 LAB — CMP14+EGFR
ALT: 23 IU/L (ref 0–44)
AST: 21 IU/L (ref 0–40)
Albumin: 4.3 g/dL (ref 3.9–4.9)
Alkaline Phosphatase: 82 IU/L (ref 44–121)
BUN/Creatinine Ratio: 13 (ref 10–24)
BUN: 15 mg/dL (ref 8–27)
Bilirubin Total: 0.7 mg/dL (ref 0.0–1.2)
CO2: 24 mmol/L (ref 20–29)
Calcium: 9.6 mg/dL (ref 8.6–10.2)
Chloride: 107 mmol/L — ABNORMAL HIGH (ref 96–106)
Creatinine, Ser: 1.15 mg/dL (ref 0.76–1.27)
Globulin, Total: 1.9 g/dL (ref 1.5–4.5)
Glucose: 80 mg/dL (ref 70–99)
Potassium: 5.5 mmol/L — ABNORMAL HIGH (ref 3.5–5.2)
Sodium: 144 mmol/L (ref 134–144)
Total Protein: 6.2 g/dL (ref 6.0–8.5)
eGFR: 70 mL/min/{1.73_m2} (ref >59)

## 2023-12-19 LAB — LIPID PANEL
Chol/HDL Ratio: 3.4 ratio (ref 0.0–5.0)
Cholesterol, Total: 132 mg/dL (ref 100–199)
HDL: 39 mg/dL — ABNORMAL LOW (ref >39)
LDL Chol Calc (NIH): 77 mg/dL (ref 0–99)
Triglycerides: 84 mg/dL (ref 0–149)
VLDL Cholesterol Cal: 16 mg/dL (ref 5–40)

## 2023-12-19 LAB — CBC WITH DIFFERENTIAL/PLATELET
Basophils Absolute: 0.1 10*3/uL (ref 0.0–0.2)
Basos: 1 %
EOS (ABSOLUTE): 0.2 10*3/uL (ref 0.0–0.4)
Eos: 3 %
Hematocrit: 42.9 % (ref 37.5–51.0)
Hemoglobin: 14.2 g/dL (ref 13.0–17.7)
Immature Grans (Abs): 0.1 10*3/uL (ref 0.0–0.1)
Immature Granulocytes: 1 %
Lymphocytes Absolute: 1.6 10*3/uL (ref 0.7–3.1)
Lymphs: 23 %
MCH: 28.8 pg (ref 26.6–33.0)
MCHC: 33.1 g/dL (ref 31.5–35.7)
MCV: 87 fL (ref 79–97)
Monocytes Absolute: 0.7 10*3/uL (ref 0.1–0.9)
Monocytes: 9 %
Neutrophils Absolute: 4.5 10*3/uL (ref 1.4–7.0)
Neutrophils: 63 %
Platelets: 125 10*3/uL — ABNORMAL LOW (ref 150–450)
RBC: 4.93 x10E6/uL (ref 4.14–5.80)
RDW: 13 % (ref 11.6–15.4)
WBC: 7.1 10*3/uL (ref 3.4–10.8)

## 2023-12-27 ENCOUNTER — Encounter: Payer: Self-pay | Admitting: Family Medicine

## 2023-12-31 ENCOUNTER — Ambulatory Visit (INDEPENDENT_AMBULATORY_CARE_PROVIDER_SITE_OTHER): Admitting: Nurse Practitioner

## 2023-12-31 ENCOUNTER — Encounter: Payer: Self-pay | Admitting: Nurse Practitioner

## 2023-12-31 VITALS — BP 118/64 | HR 67 | Temp 98.1°F | Ht 71.0 in | Wt 182.8 lb

## 2023-12-31 DIAGNOSIS — J0110 Acute frontal sinusitis, unspecified: Secondary | ICD-10-CM | POA: Diagnosis not present

## 2023-12-31 DIAGNOSIS — R051 Acute cough: Secondary | ICD-10-CM | POA: Insufficient documentation

## 2023-12-31 MED ORDER — BENZONATATE 100 MG PO CAPS
100.0000 mg | ORAL_CAPSULE | Freq: Three times a day (TID) | ORAL | 0 refills | Status: DC | PRN
Start: 2023-12-31 — End: 2024-01-28

## 2023-12-31 MED ORDER — CEFDINIR 300 MG PO CAPS
300.0000 mg | ORAL_CAPSULE | Freq: Two times a day (BID) | ORAL | 0 refills | Status: DC
Start: 2023-12-31 — End: 2024-01-28

## 2023-12-31 NOTE — Progress Notes (Signed)
 Acute Office Visit  Subjective:     Patient ID: Philip Hernandez, male    DOB: 1956/03/23, 68 y.o.   MRN: [id number]  Chief Complaint  Patient presents with   Cough    Symptoms started 2 weeks ago   Headache   Nasal Congestion    HPI Philip Hernandez is a 68 y.o. male who complains of congestion, cough described as productive of frothy sputum, and headache for 14 days. He denies a history of chest pain, dizziness, nausea, and vomiting and denies a history of asthma. Patient denies smoke cigarettes. Took Mucinex with minimal relieve   Active Ambulatory Problems    Diagnosis Date Noted   Sarcoidosis of lung (HCC) 09/28/2010   ADENOCARCINOMA, PROSTATE 09/28/2010   GERD 09/28/2010   Peripheral neuropathy in sarcoidosis 12/08/2012   HLD (hyperlipidemia) 03/26/2013   Hyperuricemia 03/26/2013   Colon polyps    Family history of malignant neoplasm of gastrointestinal tract 12/23/2013   Thrombocytopenia (HCC) 07/04/2018   Upper airway cough syndrome 12/13/2020   Acute non-recurrent frontal sinusitis 12/31/2023   Acute cough 12/31/2023   Resolved Ambulatory Problems    Diagnosis Date Noted   DOE (dyspnea on exertion) 07/04/2009   Cough 07/10/2010   Nonspecific abnormal finding in stool contents 09/28/2010   Wheezing 01/13/2013   Pain in left hip 02/25/2013   Neuropathy 02/25/2013   Numbness 02/25/2013   Need for prophylactic vaccination and inoculation against influenza 07/24/2013   CAP (community acquired pneumonia) 05/16/2015   Past Medical History:  Diagnosis Date   Allergy    Arthritis    Cataract    Colon polyp    GERD (gastroesophageal reflux disease) 09/2010   History of kidney stones    Hypogonadism male    Other and unspecified mycoses    Painful respiration    Peripheral neuropathy    Prostate cancer (HCC)    Sarcoidosis 08/2010   Skin cancer    Swelling, mass, or lump in head and neck      Review of Systems  Constitutional:  Negative for chills and fever.   HENT:  Positive for congestion. Negative for sore throat.   Respiratory:  Positive for cough and sputum production. Negative for shortness of breath and wheezing.   Cardiovascular:  Negative for chest pain and leg swelling.  Gastrointestinal:  Negative for constipation, diarrhea, nausea and vomiting.  Neurological:  Negative for dizziness and headaches.   Negative unless indicated in HPI    Objective:    BP 118/64   Pulse 67   Temp 98.1 F (36.7 C) (Temporal)   Ht 5\' 11"  (1.803 m)   Wt 182 lb 12.8 oz (82.9 kg)   SpO2 99%   BMI 25.50 kg/m  BP Readings from Last 3 Encounters:  12/31/23 118/64  12/18/23 (!) 159/79  07/16/23 138/77   Wt Readings from Last 3 Encounters:  12/31/23 182 lb 12.8 oz (82.9 kg)  12/18/23 184 lb (83.5 kg)  07/16/23 187 lb (84.8 kg)      Physical Exam Vitals and nursing note reviewed.  Constitutional:      General: He is not in acute distress. HENT:     Head: Normocephalic and atraumatic.     Right Ear: Tympanic membrane, ear canal and external ear normal. There is no impacted cerumen.     Left Ear: Tympanic membrane, ear canal and external ear normal. There is no impacted cerumen.     Nose: Congestion present.     Mouth/Throat:  Mouth: Mucous membranes are moist.  Eyes:     General: No scleral icterus.    Extraocular Movements: Extraocular movements intact.     Conjunctiva/sclera: Conjunctivae normal.     Pupils: Pupils are equal, round, and reactive to light.  Cardiovascular:     Heart sounds: Normal heart sounds.  Pulmonary:     Effort: Pulmonary effort is normal.     Breath sounds: Normal breath sounds. No wheezing.  Musculoskeletal:        General: Normal range of motion.     Right lower leg: No edema.     Left lower leg: No edema.  Skin:    General: Skin is warm and dry.     Findings: No rash.  Neurological:     Mental Status: He is alert and oriented to person, place, and time.  Psychiatric:        Mood and Affect: Mood  normal.        Behavior: Behavior normal.        Thought Content: Thought content normal.     No results found for any visits on 12/31/23.      Assessment & Plan:  Acute non-recurrent frontal sinusitis -     Cefdinir; Take 1 capsule (300 mg total) by mouth 2 (two) times daily.  Dispense: 14 capsule; Refill: 0  Acute cough -     Benzonatate; Take 1 capsule (100 mg total) by mouth 3 (three) times daily as needed.  Dispense: 20 capsule; Refill: 0   Philip Hernandez is a 68 year old Caucasian male seen today for sinusitis, no acute distress Sinusitis: Cefdinir 300 mg twice daily for 7 days, current instructed to take all of it until done Cough: Tessalon Perles 100 mg 1 tablet 3 times daily, take with at least 8 ounces of water 1. Take meds as prescribed 2. Use a cool mist humidifier especially during the winter months and when heat has been humid. 3. Use saline nose sprays frequently. 4. Saline irrigations of the nose can be very helpful if done frequently. Use 4 times daily for one week. Can use Nettie Pot if able to tolerate.  5. Drink plenty of fluids 6. Keep thermostat turned down low. 7.For any cough or congestion: Use plain Mucinex- regular strength or max strength is fine. 8. For fever or aces or pains- take tylenol or ibuprofen appropriate for age and weight, for fevers greater than 101 orally you may alternate ibuprofen and tylenol every 3 hours.    The above assessment and management plan was discussed with the patient. The patient verbalized understanding of and has agreed to the management plan. Patient is aware to call the clinic if they develop any new symptoms or if symptoms persist or worsen. Patient is aware when to return to the clinic for a follow-up visit. Patient educated on when it is appropriate to go to the emergency department.  Return if symptoms worsen or fail to improve.  Devanny Palecek [place], DNP [facility] [address] [place], [place] [address] [phone]  Note: This document was prepared by [employer] voice dictation technology and any errors that results from this process are unintentional.

## 2024-01-27 NOTE — Progress Notes (Unsigned)
 PATIENT: Philip Hernandez DOB: [birth year]   [facility] provider: Dr. Janett Hernandez REASON FOR VISIT: Neuropathy HISTORY FROM: patient    Chief complaint: No chief complaint on file.     HISTORY OF PRESENT ILLNESS:  Update 01/27/2024 JM: Patient returns for follow-up visit.   Previously discussed worsening fine motor control of hands bilaterally, recommended pursuing EMG/NCV to rule out CTS vs progression of neuropathy but this was never pursued.       History provided for reference purposes only Update 07/16/2023 JM: Returns for follow-up visit unaccompanied.  Overall stable since prior visit.  Continues to have persistent neuropathic pain in bilateral upper and lower extremities distally without any changes since prior visit. He has noticed increased difficulty with fine motor control of hands, feels slight weakness of hands, has numbness and pain throughout hand but more severe in first 3 digits. Continues on gabapentin  800 mg 4 times daily, control pain well, can worsen if he misses a dose or late on a dose. Does fatigue quickly with little activity.  S/p trigger finger release surgery on 10/9, still has some swelling and not able to use fully or make a complete fist but has been gradually improving.   Update 01/14/2023 Dr. Janett Hernandez: He returns for follow-up after last visit with Philip Hernandez nurse practitioner on 07/11/2022.  He continues to have persistent neuropathic pain.  States he has constant tingling numbness and burning electric.  He remains on gabapentin  800 mg 4 times daily which is tolerating well but does make tired and sleepy.  He has previous failed tolerating Topamax , amitriptyline  and carbamazepine .  He does state his balance is okay though he is occasionally off balance and bumping the walls or doors but cannot catch himself.  He has had no major falls or injuries.  Is not been using the cane or walker.  Patient states he has not been sleeping too well.  Does not take any sleeping aids.   He feels his difficulties are not related to his burning pain.Lab work from 12/10/2022 showed low density cholesterol was nearly optimal at 75.  CMP was significant only for mildly elevated creatinine of 1.35 which his primary physician felt was related to dehydration.  Increase his water intake.  No other new complaints.   Update 07/11/2022 JM: Patient returns for neuropathic pain follow-up visit after prior visit with Dr. Janett Hernandez 3 months ago.  He gradually tapered off carbamazepine  since prior visit without any significant changes of neuropathic pain.  He remains on gabapentin  800 mg 4 times daily.  Reports having good days and bad days.  Denies any worsening of symptoms overall since prior visit.  Continues to have neuropathy in upper and lower extremities bilaterally from wrists distally and ankles distally.  He also mentions locking of his left middle finger, present over the past couple weeks and gradually worsening, denies any specific injury.   Update 04/10/2022 Dr. Janett Hernandez He returns for follow-up after last visit with Philip Hernandez nurse practitioner in 6 months ago.  Patient had complained of persistent pain in the legs and cramping particularly at night so carbamazepine  was added.  He is currently taking 100 mg 3 times daily and is not sure if it is really helping him.  However he admits that at night he does not have to get up so often and is sleeping better.  Towards the end of March she developed itchy painful rash in both upper extremities over the forearms left more than right.  He was not sure if  this was related to exposure to sun or from carbamazepine .  He applied lotion for few days without any benefit but then the rash went away by itself.  He was seen by dermatologist who gave him a lotion but patient did not apply it.  Patient is still not entirely convinced that this was not related to the carbamazepine .  He is tolerating the current dose of 100 mg 3 times daily quite well without any obvious side  effects.  He had lab work done yesterday which showed carbamazepine  levels to be normal at 6.2.  CBC and CMP were unremarkable with serum sodium being on the low side 134.  Patient remains on gabapentin  804 times daily which is tolerating well without side effects.  He has no new complaints today.  Update 10/03/2021 JM: Returns for 65-month neuropathy follow-up.  Remains on gabapentin  800 mg 4x daily. Does report consistent pain during the day which has been stable but now worse at night with increased pain and cramping. Also reports increased LUE cramping (from elbow to finger) and continued occasional b/l hand cramping.  No further concerns at this time.  Update 03/22/2021 JM: Mr. Frail returns for 40-month neuropathy follow-up.  Reports neuropathy has been stable without worsening.  Continues gabapentin  800mg  4 times daily tolerating without side effects.  He does continue to experience pain daily which can interfere with daily activity and functioning but does notice worsening if he misses gabapentin  dosage.  He tries to stay active as tolerated.  He does have occasional bilateral foot cramping which may last 15 to 30 seconds and then subside -this is been an ongoing symptom and not worsening.  No new concerns at this time.  Update 09/20/2020 JM: Mr. Siggins returns for 21-month follow-up regarding neuropathy.  Remains on gabapentin  800 mg 4 times daily tolerating well without side effects.  Neuropathy has been stable and gabapentin  continues to provide benefit.  He will have occasional difficulty remembering to take all 4 doses and will experience worsening symptoms if he misses a dose.  He has been keeping active with routine activity and exercise in fact neuropathy symptoms can worsen after sitting for prolonged period of time. He will have occasional difficulty holding or picking up objects but relatively able to maintain ADLs and IADLs independently.  He does have occasional balance difficulties but overall  stable.  Update 03/15/2020 JM:, Mr. Eaden returns for follow-up for neuropathy with paresthesias.  He has been stable since prior visit 6 months ago with unchanged symptoms of numbness/tingling and paresthesias in bilateral lower extremities.  He continues to experience good days and bad days.  Continues on gabapentin  800 mg 4 times daily tolerating dosage well. At times, may forget to take day time dosage and will notice worsening in symptoms.  He has not trialed additional dose of gabapentin  with worsening symptoms as previously discussed with Dr. Janett Hernandez. No further concerns at this time.  Update 09/15/2019 Dr. Janett Hernandez : He returns for follow-up after last virtual video visit on 03/02/2019.  He continues to have tingling numbness and paresthesias in his feet which are more or less unchanged.  He has good days and bad days.  Physical activity does increase it.  Patient was started on Topamax  previously and I will increase the dose to 50 mg twice daily but he had trouble tolerating this and stated that he could not focus with his eyes and had trouble reading and had to rub his eyes.  After taking it for 4 to  6 weeks he discontinued it.  He did note that increasing the dose of Topamax  did help to some degree.  He remains on gabapentin  and the current dose of 800 mg 4 times daily.  He has not tried Lyrica or Tegretol  yet but does not want to try new medication.  Patient was diagnosed with Covid infection 3 weeks ago and did self quarantine himself and now has started going out last few days.  He is currently on prednisone  taper as he also has sarcoidosis.  He does complain of feeling tired and having a minimum cough but is otherwise doing well.  His paresthesias are constant but is able to walk well he will have no problems with falls or balance issues.  Update via virtual visit 03/02/2019 Dr. Janett Hernandez: Mr. Boardwine is seen today for virtual video office follow-up visit following last visit with Clem Currier, nurse  practitioner on 08/25/2018.  He states he is doing about the same.  He still has paresthesias in his feet off and on.  He has good days and bad days.  He remains on Topamax  25 mg twice daily which is tolerating well but he is states if not sure if it is doing anything.  He also remains on gabapentin  800 mg 4 times daily and he does not miss a dose.  He continues to have gait and balance difficulties but is able to catch himself and has not fallen or hurt himself.  He does not use a cane or walker.  He recently got handicap parking sticker renewed by me.  He has no new neurological complaints.  Update 08/25/2018 MM: Mr. Blumer is a 68 year old male with a history of paresthesias in the feet.  He returns today for follow-up.  He remains on gabapentin  800 mg 4 times a day.  He states that there are times that he will miss doses and his pain worsens.  Patient was also advised to start taking Topamax  consistently.  He states that he is taking it more than he was but not necessarily consistently.  He states that he always has burning and tingling in the feet although most the time it is tolerable during the day unless he misses a dose of gabapentin .  He denies any significant changes with his gait or balance.  He does state that he is off balance at times.  Fortunately he has not had any recent falls.  He returns today for evaluation.   HISTORY 02/03/18: Mr. Keizer is a 68 year old male with a history of paresthesias in the feet.  He returns today for follow-up.  He is currently on gabapentin  800 mg 4 times a day.  He reports that this work well most the time.  He states recently he has noticed  more pain in the legs.  He states that he does not take Topamax  consistently.  Reports that he went to [place]  recently and he did take Topamax  daily and he felt that his pain was under better control.  Denies any significant changes with his gait or balance.  Denies any falls.  He returns today for evaluation.   REVIEW OF  SYSTEMS: Out of a complete 14 system review of symptoms, the patient complains only of the following symptoms, and all other reviewed systems are negative.  See HPI  ALLERGIES: Allergies  Allergen Reactions   Corn-Containing Products     HOME MEDICATIONS: Outpatient Medications Prior to Visit  Medication Sig Dispense Refill   acetaminophen  (TYLENOL ) 500 MG tablet Take 500  mg by mouth every 6 (six) hours as needed.     benzonatate  (TESSALON  PERLES) 100 MG capsule Take 1 capsule (100 mg total) by mouth 3 (three) times daily as needed. 20 capsule 0   cefdinir  (OMNICEF ) 300 MG capsule Take 1 capsule (300 mg total) by mouth 2 (two) times daily. 14 capsule 0   cetirizine (ZYRTEC) 10 MG tablet Take 10 mg by mouth daily.     cholecalciferol (VITAMIN D) 1000 UNITS tablet Take 1,000 Units by mouth daily.     gabapentin  (NEURONTIN ) 800 MG tablet TAKE 1 TABLET BY MOUTH 4 TIMES  DAILY 360 tablet 3   melatonin 3 MG TABS tablet Take 1 tablet (3 mg total) by mouth at bedtime. 1 tablet 0   Multiple Vitamins-Minerals (AIRBORNE PO) Take by mouth daily.     omeprazole  (PRILOSEC) 40 MG capsule TAKE ONE CAPSULE BY MOUTH TWICE A DAY 180 capsule 3   rosuvastatin  (CRESTOR ) 10 MG tablet TAKE ONE (1) TABLET BY MOUTH EVERY DAY 90 tablet 3   No facility-administered medications prior to visit.    PAST MEDICAL HISTORY: Past Medical History:  Diagnosis Date   Allergy    Arthritis    Cataract    Colon polyp    Colon polyps 2015   Cough    chronic sinus issues, GERD with poor dietary control and Pulmonary Sarcoid.    GERD (gastroesophageal reflux disease) 09/2010   Dr Arvie Latus. Bx proven.  Deve abd pain in Jan 2012 following high dose steroids for sarcoid. s/p EGD and improvement in symptoms following PPI   History of kidney stones    Hypogonadism male    Malignant neoplasm of prostate (HCC) 08/2009   Dr Florencio Hunting. s/p surgery dec 2010, hx of rnormal PSA  since then   Other and unspecified mycoses     Painful respiration    Peripheral neuropathy    Prostate cancer (HCC)    reoccurence (getting radiation 01-14-17 start).   Sarcoidosis 08/2010   stage 2 pulmonary, neck nodes, splenic granuloma   Skin cancer    Swelling, mass, or lump in head and neck     PAST SURGICAL HISTORY: Past Surgical History:  Procedure Laterality Date   HERNIA REPAIR  13 yrs ago   HERNIA REPAIR     INGUINAL HERNIA REPAIR Right 06/11/2022   Procedure: OPEN RIGHT INGUINAL HERNIA REPAIR WITH MESH;  Surgeon: Oza Blumenthal, MD;  Location: [facility];  Service: General;  Laterality: Right;  LMA   LYMPH NODE BIOPSY  05/2010, 07/2010   nasal polyps  1984   NASAL SINUS SURGERY  2013   PROSTATE SURGERY  2010   radiation 2018   SHOULDER SURGERY Right 2009, 2010   x 2   SKIN CANCER EXCISION  11/16/14   TRIGGER FINGER RELEASE Left 06/26/2023   Procedure: RELEASE MIDDLE FINGER  TRIGGER FINGER/A-1 PULLEY;  Surgeon: Marilyn Shropshire, MD;  Location: [facility];  Service: Orthopedics;  Laterality: Left;  local 30   UMBILICAL HERNIA REPAIR N/A 06/11/2022   Procedure: HERNIA REPAIR UMBILICAL ADULT;  Surgeon: Oza Blumenthal, MD;  Location: [facility];  Service: General;  Laterality: N/A;    FAMILY HISTORY: Family History  Problem Relation Age of Onset   Colon cancer Mother 72   Skin cancer Mother    Cancer Mother        colon   Colon cancer Brother 75   Skin cancer Brother    Cancer Brother  colon   Colon cancer Maternal Aunt        dx in her 76s   Cancer Maternal Aunt        "cancer of male organs" dx in her 17s   Breast cancer Maternal Aunt 40       bilateral, dx again at 51   Colon cancer Maternal Aunt 35   Stomach cancer Maternal Aunt 36   Dementia Father    Stomach cancer Maternal Uncle 47   Skin cancer Paternal Aunt    Prostate cancer Paternal Uncle 47   Cancer Paternal Uncle        prostate   Stomach cancer Paternal Grandmother        dx in her  3s   Stroke Paternal Grandfather    Colon cancer Maternal Aunt 68   Prostate cancer Paternal Uncle    Cancer Paternal Uncle        prostate   Prostate cancer Paternal Uncle    Kidney cancer Paternal Uncle    Lung cancer Paternal Uncle    Cancer Paternal Uncle        prostate   Lung cancer Paternal Uncle    Cancer Paternal Uncle        unknown   Cancer Paternal Uncle        prostate   Cancer Cousin        paternal cousin with cancer NOS   Cancer Cousin        paternal cousin with cancer NOS   Esophageal cancer Neg Hx    Rectal cancer Neg Hx     SOCIAL HISTORY: Social History   Socioeconomic History   Marital status: Married    Spouse name: Not on file   Number of children: 2   Years of education: Not on file   Highest education level: Not on file  Occupational History   Occupation: unemployed  Tobacco Use   Smoking status: Never   Smokeless tobacco: Never  Vaping Use   Vaping status: Never Used  Substance and Sexual Activity   Alcohol use: No   Drug use: No   Sexual activity: Yes  Other Topics Concern   Not on file  Social History Narrative   Married   2 children   Social Drivers of Health   Financial Resource Strain: Low Risk  (12/12/2022)   Overall Financial Resource Strain (CARDIA)    Difficulty of Paying Living Expenses: Not hard at all  Food Insecurity: No Food Insecurity (12/12/2022)   Hunger Vital Sign    Worried About Running Out of Food in the Last Year: Never true    Ran Out of Food in the Last Year: Never true  Transportation Needs: No Transportation Needs (12/12/2022)   PRAPARE - Administrator, Civil Service (Medical): No    Lack of Transportation (Non-Medical): No  Physical Activity: Inactive (12/12/2022)   Exercise Vital Sign    Days of Exercise per Week: 0 days    Minutes of Exercise per Session: 0 min  Stress: No Stress Concern Present (12/12/2022)   [employer] of Occupational Health - Occupational Stress Questionnaire     Feeling of Stress : Not at all  Social Connections: Moderately Integrated (12/12/2022)   Social Connection and Isolation Panel [NHANES]    Frequency of Communication with Friends and Family: More than three times a week    Frequency of Social Gatherings with Friends and Family: More than three times a week    Attends  Religious Services: 1 to 4 times per year    Active Member of Clubs or Organizations: No    Attends Banker Meetings: Never    Marital Status: Married  Catering manager Violence: Not At Risk (12/12/2022)   Humiliation, Afraid, Rape, and Kick questionnaire    Fear of Current or Ex-Partner: No    Emotionally Abused: No    Physically Abused: No    Sexually Abused: No      PHYSICAL EXAM  There were no vitals filed for this visit.  There is no height or weight on file to calculate BMI.  Generalized: Pleasant middle-aged Caucasian male, in no acute distress   Neurological examination  Mentation: Alert oriented to time, place, history taking. Follows all commands speech and language fluent Cranial nerve II-XII: Pupils were equal round reactive to light. Extraocular movements were full, visual field were full on confrontational test. Facial sensation and strength were normal. Uvula tongue midline. Head turning and shoulder shrug  were normal and symmetric. Motor: The motor testing reveals 5 over 5 strength of all 4 extremities except slight grip weakness bilaterally although difficulty fully testing left hand due to recent trigger finger release surgery Sensory: Sensory testing decreased vibratory sensation distal bilateral lower extremities.. No evidence of extinction is noted.  Slight hyperesthesia over toes bilaterally. Decreased sensation bilateral hands distally to pin prick, vibration and temperature.  Subjective numbness BLE stocking distribution Coordination: Cerebellar testing reveals good finger-nose-finger and heel-to-shin bilaterally.  Gait and  station: Gait is normal. Tandem gait is slightly unsteady.  Romberg is negative. No drift is seen.  Reflexes: Deep tendon reflexes are symmetric and normal bilaterally.     DIAGNOSTIC DATA (LABS, IMAGING, TESTING) - I reviewed patient records, labs, notes, testing and imaging myself where available.  Lab Results  Component Value Date   WBC 7.1 12/18/2023   HGB 14.2 12/18/2023   HCT 42.9 12/18/2023   MCV 87 12/18/2023   PLT 125 (L) 12/18/2023      Component Value Date/Time   NA 144 12/18/2023 1120   K 5.5 (H) 12/18/2023 1120   CL 107 (H) 12/18/2023 1120   CO2 24 12/18/2023 1120   GLUCOSE 80 12/18/2023 1120   GLUCOSE 80 03/26/2013 1220   BUN 15 12/18/2023 1120   CREATININE 1.15 12/18/2023 1120   CREATININE 1.15 03/26/2013 1220   CALCIUM  9.6 12/18/2023 1120   PROT 6.2 12/18/2023 1120   ALBUMIN 4.3 12/18/2023 1120   AST 21 12/18/2023 1120   ALT 23 12/18/2023 1120   ALKPHOS 82 12/18/2023 1120   BILITOT 0.7 12/18/2023 1120   GFRNONAA 71 06/07/2020 1421   GFRNONAA 71 03/26/2013 1220   GFRAA 82 06/07/2020 1421   GFRAA 82 03/26/2013 1220   Lab Results  Component Value Date   CHOL 132 12/18/2023   HDL 39 (L) 12/18/2023   LDLCALC 77 12/18/2023   TRIG 84 12/18/2023   CHOLHDL 3.4 12/18/2023   No results found for: "HGBA1C" No results found for: "VITAMINB12" Lab Results  Component Value Date   TSH 2.360 12/10/2022    01/08/13 NCV/EMG Nerve conduction studies done on both lower extremities and the left upper extremity shows evidence of primarily motor involvement of the nerves in the legs, normal on the left arm. EMG evaluation of the left lower extremity shows mild primarily distal acute and chronic denervation, consistent with a peripheral neuropathy. There is no evidence of nerve root involvement. EMG evaluation shows evidence of a mononeuropathy affecting the median nerve  proximally, again without evidence of a cervical radiculopathy. In the appropriate clinical setting,  the study could be consistent with Guillian-Barre syndrome, but some chronic features of denervation also seen. Sarcoidosis may be another consideration, but no sensory involvement is noted.      ASSESSMENT AND PLAN 68 y.o. year old male  has a past medical history of Allergy, Arthritis, Cataract, Colon polyp, Colon polyps (2015), Cough, GERD (gastroesophageal reflux disease) (09/2010), History of kidney stones, Hypogonadism male, Malignant neoplasm of prostate (HCC) (08/2009), Other and unspecified mycoses, Painful respiration, Peripheral neuropathy, Prostate cancer (HCC), Sarcoidosis (08/2010), Skin cancer, and Swelling, mass, or lump in head and neck. here with :   1.  Paresthesias in the lower extremities from small fiber neuropathy possibly related to sarcoidosis  -Greater difficulty with fine motor control of hands bilaterally and increased dysesthesias in first 3 digits bilaterally -Recommend repeat EMG/NCV - rule out CTS contributing to symptoms  -Recommended OT but patient wishes to complete EMG/NCV first -Continue gabapentin  800 mg 4 times daily - refill provided -discussed consider trial of duloxetine but declines interest at this time  -Previously tried: Topamax , carbamazepine  and amitriptyline   -discussed further evaluation at academic center but declines interest at this time     Follow-up in 6 months or call earlier if needed   CC:  Dettinger, Lucio Sabin, MD    I spent 30 minutes of face-to-face and non-face-to-face time with patient.  This included previsit chart review, lab review, study review, order entry, electronic health record documentation, patient education and discussion regarding above diagnoses and treatment plan and answered all the questions to patient's satisfaction   Johny Nap, [facility]  [facility] [address] [place], [place] [address]  Phone [phone] Fax [phone] Note: This document was prepared with  digital dictation and possible smart phrase technology. Any transcriptional errors that result from this process are unintentional.

## 2024-01-28 ENCOUNTER — Ambulatory Visit (INDEPENDENT_AMBULATORY_CARE_PROVIDER_SITE_OTHER): Payer: BC Managed Care – PPO | Admitting: Adult Health

## 2024-01-28 ENCOUNTER — Telehealth: Payer: Self-pay | Admitting: Gastroenterology

## 2024-01-28 ENCOUNTER — Encounter: Payer: Self-pay | Admitting: Adult Health

## 2024-01-28 VITALS — BP 135/75 | HR 61 | Ht 70.0 in | Wt 183.0 lb

## 2024-01-28 DIAGNOSIS — M792 Neuralgia and neuritis, unspecified: Secondary | ICD-10-CM | POA: Diagnosis not present

## 2024-01-28 DIAGNOSIS — D8689 Sarcoidosis of other sites: Secondary | ICD-10-CM | POA: Diagnosis not present

## 2024-01-28 MED ORDER — GABAPENTIN 800 MG PO TABS: 800.0000 mg | ORAL_TABLET | Freq: Four times a day (QID) | ORAL | 3 refills | Status: DC

## 2024-01-28 NOTE — Telephone Encounter (Signed)
 Good afternoon Dr. General Kenner,  I received a call from this patient wishing to schedule his recall colonoscopy either in June or July. Patient has a recall in place for 05/2024. Patient states that he now has a primary and secondary insurance that will cover procedure fully, but his secondary will expire on 7/31. Patient would then only have one insurance, and procedure would become unaffordable for him. Would you be okay with  patient scheduling procedure sooner than September or should patient wait until after his recall date?  Please advise   Thank you.

## 2024-01-28 NOTE — Patient Instructions (Addendum)
 Your Plan:  Continue gabapentin  800mg  four times daily   Please let me know if you would like me to send in additional 400mg  dosing as needed      Follow up in 6 months with Dr. Janett Medin or call earlier if needed     Thank you for coming to see us  at [facility]. I hope we have been able to provide you high quality care today.  You may receive a patient satisfaction survey over the next few weeks. We would appreciate your feedback and comments so that we may continue to improve ourselves and the health of our patients.

## 2024-01-28 NOTE — Telephone Encounter (Signed)
 Yes okay to do to it in July - he actually needs both an EGD and colonoscopy, can schedule in the [facility] if he otherwise meets criteria. Thanks

## 2024-01-29 ENCOUNTER — Encounter: Payer: Self-pay | Admitting: Gastroenterology

## 2024-01-29 DIAGNOSIS — C61 Malignant neoplasm of prostate: Secondary | ICD-10-CM | POA: Diagnosis not present

## 2024-02-05 DIAGNOSIS — C61 Malignant neoplasm of prostate: Secondary | ICD-10-CM | POA: Diagnosis not present

## 2024-02-05 DIAGNOSIS — N5201 Erectile dysfunction due to arterial insufficiency: Secondary | ICD-10-CM | POA: Diagnosis not present

## 2024-02-17 DIAGNOSIS — S82841A Displaced bimalleolar fracture of right lower leg, initial encounter for closed fracture: Secondary | ICD-10-CM | POA: Diagnosis not present

## 2024-02-17 DIAGNOSIS — M79671 Pain in right foot: Secondary | ICD-10-CM | POA: Diagnosis not present

## 2024-02-18 ENCOUNTER — Other Ambulatory Visit: Payer: Self-pay | Admitting: Orthopedic Surgery

## 2024-02-18 DIAGNOSIS — S82841A Displaced bimalleolar fracture of right lower leg, initial encounter for closed fracture: Secondary | ICD-10-CM

## 2024-02-19 ENCOUNTER — Ambulatory Visit
Admission: RE | Admit: 2024-02-19 | Discharge: 2024-02-19 | Disposition: A | Discharge status: Home / Self Care | Source: Ambulatory Visit | Attending: Orthopedic Surgery | Admitting: Orthopedic Surgery

## 2024-02-19 DIAGNOSIS — S82434A Nondisplaced oblique fracture of shaft of right fibula, initial encounter for closed fracture: Secondary | ICD-10-CM | POA: Diagnosis not present

## 2024-02-19 DIAGNOSIS — S82201A Unspecified fracture of shaft of right tibia, initial encounter for closed fracture: Secondary | ICD-10-CM | POA: Diagnosis not present

## 2024-02-19 DIAGNOSIS — S82841A Displaced bimalleolar fracture of right lower leg, initial encounter for closed fracture: Secondary | ICD-10-CM

## 2024-02-20 DIAGNOSIS — S82851A Displaced trimalleolar fracture of right lower leg, initial encounter for closed fracture: Secondary | ICD-10-CM | POA: Diagnosis not present

## 2024-02-26 ENCOUNTER — Encounter (HOSPITAL_BASED_OUTPATIENT_CLINIC_OR_DEPARTMENT_OTHER): Payer: Self-pay | Admitting: Orthopaedic Surgery

## 2024-02-26 ENCOUNTER — Other Ambulatory Visit: Payer: Self-pay

## 2024-02-28 ENCOUNTER — Encounter (HOSPITAL_BASED_OUTPATIENT_CLINIC_OR_DEPARTMENT_OTHER)
Admission: RE | Admit: 2024-02-28 | Discharge: 2024-02-28 | Disposition: A | Discharge status: Home / Self Care | Source: Ambulatory Visit | Attending: Orthopaedic Surgery | Admitting: Orthopaedic Surgery

## 2024-02-28 DIAGNOSIS — Z01812 Encounter for preprocedural laboratory examination: Secondary | ICD-10-CM | POA: Insufficient documentation

## 2024-02-28 LAB — BASIC METABOLIC PANEL WITH GFR
Anion gap: 8 (ref 5–15)
BUN: 15 mg/dL (ref 8–23)
CO2: 25 mmol/L (ref 22–32)
Calcium: 9.3 mg/dL (ref 8.9–10.3)
Chloride: 106 mmol/L (ref 98–111)
Creatinine, Ser: 1.12 mg/dL (ref 0.61–1.24)
GFR, Estimated: >60 mL/min (ref >60)
Glucose, Bld: 86 mg/dL (ref 70–99)
Potassium: 5.3 mmol/L — ABNORMAL HIGH (ref 3.5–5.1)
Sodium: 139 mmol/L (ref 135–145)

## 2024-02-28 NOTE — Progress Notes (Signed)

## 2024-03-01 NOTE — Discharge Instructions (Signed)
 Philip Cedars, MD [facility]  Please read the following information regarding your care after surgery.  Medications  You only need a prescription for the narcotic pain medicine (ex. oxycodone, Percocet, Norco).  All of the other medicines listed below are available over the counter. ? Aleve 2 pills twice a day for the first 3 days after surgery. ? acetominophen (Tylenol) 650 mg every 4-6 hours as you need for minor to moderate pain ? oxycodone as prescribed for severe pain  ? To help prevent blood clots, take aspirin (81 mg) twice daily for at least 42 days after surgery.  You should also get up every hour while you are awake to move around.  Weight Bearing ? Do NOT bear any weight on the operated leg or foot. This means do NOT touch your surgical leg to the ground!  Cast / Splint / Dressing ? If you have a splint, do NOT remove this. Keep your splint, cast or dressing clean and dry.  Don't put anything (coat hanger, pencil, etc) down inside of it.  If it gets wet, call the office immediately to schedule an appointment for a cast change.  Swelling IMPORTANT: It is normal for you to have swelling where you had surgery. To reduce swelling and pain, keep at least 3 pillows under your leg so that your toes are above your nose and your heel is above the level of your hip.  It may be necessary to keep your foot or leg elevated for several weeks.  This is critical to helping your incisions heal and your pain to feel better.  Follow Up Call my office at [phone] when you are discharged from the hospital or surgery center to schedule an appointment to be seen 7-10 days after surgery.  Call my office at [phone] if you develop a fever >101.5 F, nausea, vomiting, bleeding from the surgical site or severe pain.     Post Anesthesia Home Care Instructions  Activity: Get plenty of rest for the remainder of the day. A responsible individual must stay with you for 24 hours following the  procedure.  For the next 24 hours, DO NOT: -Drive a car -Advertising copywriter -Drink alcoholic beverages -Take any medication unless instructed by your physician -Make any legal decisions or sign important papers.  Meals: Start with liquid foods such as gelatin or soup. Progress to regular foods as tolerated. Avoid greasy, spicy, heavy foods. If nausea and/or vomiting occur, drink only clear liquids until the nausea and/or vomiting subsides. Call your physician if vomiting continues.  Special Instructions/Symptoms: Your throat may feel dry or sore from the anesthesia or the breathing tube placed in your throat during surgery. If this causes discomfort, gargle with warm salt water. The discomfort should disappear within 24 hours.  If you had a scopolamine patch placed behind your ear for the management of post- operative nausea and/or vomiting:  1. The medication in the patch is effective for 72 hours, after which it should be removed.  Wrap patch in a tissue and discard in the trash. Wash hands thoroughly with soap and water. 2. You may remove the patch earlier than 72 hours if you experience unpleasant side effects which may include dry mouth, dizziness or visual disturbances. 3. Avoid touching the patch. Wash your hands with soap and water after contact with the patch.  Regional Anesthesia Blocks  1. You may not be able to move or feel the "blocked" extremity after a regional anesthetic block. This may last may last  from 3-48 hours after placement, but it will go away. The length of time depends on the medication injected and your individual response to the medication. As the nerves start to wake up, you may experience tingling as the movement and feeling returns to your extremity. If the numbness and inability to move your extremity has not gone away after 48 hours, please call your surgeon.   2. The extremity that is blocked will need to be protected until the numbness is gone and the  strength has returned. Because you cannot feel it, you will need to take extra care to avoid injury. Because it may be weak, you may have difficulty moving it or using it. You may not know what position it is in without looking at it while the block is in effect.  3. For blocks in the legs and feet, returning to weight bearing and walking needs to be done carefully. You will need to wait until the numbness is entirely gone and the strength has returned. You should be able to move your leg and foot normally before you try and bear weight or walk. You will need someone to be with you when you first try to ensure you do not fall and possibly risk injury.  4. Bruising and tenderness at the needle site are common side effects and will resolve in a few days.  5. Persistent numbness or new problems with movement should be communicated to the surgeon or the [facility] [phone] [facility] [phone]).

## 2024-03-01 NOTE — H&P (Signed)
 ORTHOPAEDIC SURGERY H&P  Subjective:  The patient presents with right ankle fx.   Past Medical History:  Diagnosis Date   Allergy    Arthritis    Cataract    Colon polyp    Colon polyps 2015   Cough    chronic sinus issues, GERD with poor dietary control and Pulmonary Sarcoid.    GERD (gastroesophageal reflux disease) 09/2010   Dr Arvie Latus. Bx proven.  Deve abd pain in Jan 2012 following high dose steroids for sarcoid. s/p EGD and improvement in symptoms following PPI   History of kidney stones    Hypogonadism male    Malignant neoplasm of prostate (HCC) 08/2009   Dr Florencio Hunting. s/p surgery dec 2010, hx of rnormal PSA  since then   Other and unspecified mycoses    Painful respiration    Peripheral neuropathy    Prostate cancer (HCC)    reoccurence (getting radiation 01-14-17 start).   Sarcoidosis 08/2010   stage 2 pulmonary, neck nodes, splenic granuloma   Skin cancer    Swelling, mass, or lump in head and neck     Past Surgical History:  Procedure Laterality Date   HERNIA REPAIR  13 yrs ago   HERNIA REPAIR     INGUINAL HERNIA REPAIR Right 06/11/2022   Procedure: OPEN RIGHT INGUINAL HERNIA REPAIR WITH MESH;  Surgeon: Oza Blumenthal, MD;  Location: [facility];  Service: General;  Laterality: Right;  LMA   LYMPH NODE BIOPSY  05/2010, 07/2010   nasal polyps  1984   NASAL SINUS SURGERY  2013   PROSTATE SURGERY  2010   radiation 2018   SHOULDER SURGERY Right 2009, 2010   x 2   SKIN CANCER EXCISION  11/16/14   TRIGGER FINGER RELEASE Left 06/26/2023   Procedure: RELEASE MIDDLE FINGER  TRIGGER FINGER/A-1 PULLEY;  Surgeon: Marilyn Shropshire, MD;  Location: [facility];  Service: Orthopedics;  Laterality: Left;  local 30   UMBILICAL HERNIA REPAIR N/A 06/11/2022   Procedure: HERNIA REPAIR UMBILICAL ADULT;  Surgeon: Oza Blumenthal, MD;  Location: [facility];  Service: General;  Laterality: N/A;     (Not in an outpatient  encounter)    Allergies  Allergen Reactions   Corn-Containing Products     Social History   Socioeconomic History   Marital status: Married    Spouse name: Not on file   Number of children: 2   Years of education: Not on file   Highest education level: Not on file  Occupational History   Occupation: unemployed  Tobacco Use   Smoking status: Never   Smokeless tobacco: Never  Vaping Use   Vaping status: Never Used  Substance and Sexual Activity   Alcohol use: No   Drug use: No   Sexual activity: Yes  Other Topics Concern   Not on file  Social History Narrative   Married   2 children   Social Drivers of Health   Financial Resource Strain: Low Risk  (12/12/2022)   Overall Financial Resource Strain (CARDIA)    Difficulty of Paying Living Expenses: Not hard at all  Food Insecurity: No Food Insecurity (12/12/2022)   Hunger Vital Sign    Worried About Running Out of Food in the Last Year: Never true    Ran Out of Food in the Last Year: Never true  Transportation Needs: No Transportation Needs (12/12/2022)   PRAPARE - Transportation    Lack of Transportation (Medical): No    Lack  of Transportation (Non-Medical): No  Physical Activity: Inactive (12/12/2022)   Exercise Vital Sign    Days of Exercise per Week: 0 days    Minutes of Exercise per Session: 0 min  Stress: No Stress Concern Present (12/12/2022)   [employer] of Occupational Health - Occupational Stress Questionnaire    Feeling of Stress : Not at all  Social Connections: Moderately Integrated (12/12/2022)   Social Connection and Isolation Panel    Frequency of Communication with Friends and Family: More than three times a week    Frequency of Social Gatherings with Friends and Family: More than three times a week    Attends Religious Services: 1 to 4 times per year    Active Member of [employer] or Organizations: No    Attends Banker Meetings: Never    Marital Status: Married  Catering manager  Violence: Not At Risk (12/12/2022)   Humiliation, Afraid, Rape, and Kick questionnaire    Fear of Current or Ex-Partner: No    Emotionally Abused: No    Physically Abused: No    Sexually Abused: No     History reviewed. No pertinent family history.   Review of Systems Pertinent items are noted in HPI.  Objective: Vital signs in last 24 hours:    02/26/2024   11:18 AM 01/28/2024   11:09 AM 12/31/2023    9:25 AM  Vitals with BMI  Height 5' 10 5' 10 5' 11  Weight 183 lbs 183 lbs 182 lbs 13 oz  BMI 26.26 26.26 25.51  Systolic  135 118  Diastolic  75 64  Pulse  61 67      EXAM: General: Well nourished, well developed. Awake, alert and oriented to time, place, person. Normal mood and affect. No apparent distress. Breathing room air.  Operative Lower Extremity: Alignment - Neutral Deformity - None Skin intact Tenderness to palpation - right ankle 5/5 TA, PT, GS, Per, EHL, FHL Sensation intact to light touch throughout Palpable DP and PT pulses Special testing: None  The contralateral foot/ankle was examined for comparison and noted to be neurovascularly intact with no localized deformity, swelling, or tenderness.  Imaging Review All images taken were independently reviewed by me.  Assessment/Plan: The clinical and radiographic findings were reviewed and discussed at length with the patient.  The patient has right ankle fx.  We spoke at length about the natural course of these findings. We discussed nonoperative and operative treatment options in detail.  The risks and benefits were presented and reviewed. The risks due to hardware failure/irritation, new/persistent/recurrent infection, stiffness, nerve/vessel/tendon injury, nonunion/malunion of any fracture, wound healing issues, allograft usage, development of arthritis, failure of this surgery, possibility of external fixation in certain situations, possibility of delayed definitive surgery, need for further  surgery, prolonged wound care including further soft tissue coverage procedures, thromboembolic events, anesthesia/medical complications/events perioperatively and beyond, amputation, death among others were discussed. The patient acknowledged the explanation and agreed to proceed with the plan.  Ali Ink  Orthopaedic Surgery [facility]

## 2024-03-03 NOTE — Progress Notes (Signed)
 K+ 5.3 reviewed by Dr. Stoltzfus, will proceed with surgery as scheduled.

## 2024-03-04 ENCOUNTER — Encounter (HOSPITAL_BASED_OUTPATIENT_CLINIC_OR_DEPARTMENT_OTHER): Payer: Self-pay | Admitting: Orthopaedic Surgery

## 2024-03-04 ENCOUNTER — Ambulatory Visit (HOSPITAL_BASED_OUTPATIENT_CLINIC_OR_DEPARTMENT_OTHER): Admitting: Anesthesiology

## 2024-03-04 ENCOUNTER — Ambulatory Visit (HOSPITAL_BASED_OUTPATIENT_CLINIC_OR_DEPARTMENT_OTHER)
Admission: RE | Admit: 2024-03-04 | Discharge: 2024-03-04 | Disposition: A | Discharge status: Home / Self Care | Source: Ambulatory Visit | Attending: Orthopaedic Surgery | Admitting: Orthopaedic Surgery

## 2024-03-04 ENCOUNTER — Other Ambulatory Visit: Payer: Self-pay

## 2024-03-04 ENCOUNTER — Ambulatory Visit (HOSPITAL_COMMUNITY)

## 2024-03-04 ENCOUNTER — Encounter (HOSPITAL_BASED_OUTPATIENT_CLINIC_OR_DEPARTMENT_OTHER)
Admission: RE | Disposition: A | Discharge status: Home / Self Care | Payer: Self-pay | Source: Ambulatory Visit | Attending: Orthopaedic Surgery

## 2024-03-04 DIAGNOSIS — X58XXXA Exposure to other specified factors, initial encounter: Secondary | ICD-10-CM | POA: Diagnosis not present

## 2024-03-04 DIAGNOSIS — S82851A Displaced trimalleolar fracture of right lower leg, initial encounter for closed fracture: Secondary | ICD-10-CM | POA: Insufficient documentation

## 2024-03-04 DIAGNOSIS — S93431A Sprain of tibiofibular ligament of right ankle, initial encounter: Secondary | ICD-10-CM | POA: Diagnosis not present

## 2024-03-04 DIAGNOSIS — S82841A Displaced bimalleolar fracture of right lower leg, initial encounter for closed fracture: Secondary | ICD-10-CM | POA: Diagnosis not present

## 2024-03-04 DIAGNOSIS — Z01818 Encounter for other preprocedural examination: Secondary | ICD-10-CM

## 2024-03-04 DIAGNOSIS — G8918 Other acute postprocedural pain: Secondary | ICD-10-CM | POA: Diagnosis not present

## 2024-03-04 HISTORY — PX: ORIF ANKLE FRACTURE: SHX5408

## 2024-03-04 HISTORY — PX: LIGAMENT REPAIR: SHX5444

## 2024-03-04 HISTORY — PX: SYNDESMOSIS REPAIR: SHX5182

## 2024-03-04 SURGERY — OPEN REDUCTION INTERNAL FIXATION (ORIF) ANKLE FRACTURE
Anesthesia: Regional | Site: Ankle | Laterality: Right

## 2024-03-04 MED ORDER — FENTANYL CITRATE (PF) 100 MCG/2ML IJ SOLN
INTRAMUSCULAR | Status: AC
  Filled 2024-03-04: qty 2

## 2024-03-04 MED ORDER — ONDANSETRON HCL 4 MG/2ML IJ SOLN: 4.0000 mg | Freq: Once | INTRAMUSCULAR | Status: DC | PRN

## 2024-03-04 MED ORDER — CEFAZOLIN SODIUM-DEXTROSE 2-4 GM/100ML-% IV SOLN
2.0000 g | INTRAVENOUS | Status: AC
  Administered 2024-03-04: 2 g via INTRAVENOUS

## 2024-03-04 MED ORDER — LACTATED RINGERS IV SOLN
INTRAVENOUS | Status: DC
Start: 2024-03-04 — End: 2024-03-04

## 2024-03-04 MED ORDER — EPHEDRINE 5 MG/ML INJ
INTRAVENOUS | Status: AC
  Filled 2024-03-04: qty 5

## 2024-03-04 MED ORDER — LIDOCAINE HCL (CARDIAC) PF 100 MG/5ML IV SOSY
PREFILLED_SYRINGE | INTRAVENOUS | Status: DC | PRN
  Administered 2024-03-04: 40 mg via INTRAVENOUS

## 2024-03-04 MED ORDER — ONDANSETRON HCL 4 MG/2ML IJ SOLN
INTRAMUSCULAR | Status: DC | PRN
  Administered 2024-03-04: 4 mg via INTRAVENOUS

## 2024-03-04 MED ORDER — DEXAMETHASONE SODIUM PHOSPHATE 10 MG/ML IJ SOLN
INTRAMUSCULAR | Status: DC | PRN
  Administered 2024-03-04: 5 mg via INTRAVENOUS

## 2024-03-04 MED ORDER — AMISULPRIDE (ANTIEMETIC) 5 MG/2ML IV SOLN: 10.0000 mg | Freq: Once | INTRAVENOUS | Status: DC | PRN

## 2024-03-04 MED ORDER — ONDANSETRON HCL 4 MG/2ML IJ SOLN
INTRAMUSCULAR | Status: AC
  Filled 2024-03-04: qty 2

## 2024-03-04 MED ORDER — PROPOFOL 10 MG/ML IV BOLUS
INTRAVENOUS | Status: AC
  Filled 2024-03-04: qty 20

## 2024-03-04 MED ORDER — POVIDONE-IODINE 10 % EX SOLN
CUTANEOUS | Status: DC | PRN
Start: 2024-03-04 — End: 2024-03-04
  Administered 2024-03-04: 1 via TOPICAL

## 2024-03-04 MED ORDER — CEFAZOLIN SODIUM-DEXTROSE 2-4 GM/100ML-% IV SOLN
INTRAVENOUS | Status: AC
  Filled 2024-03-04: qty 100

## 2024-03-04 MED ORDER — MIDAZOLAM HCL 2 MG/2ML IJ SOLN
INTRAMUSCULAR | Status: AC
  Filled 2024-03-04: qty 2

## 2024-03-04 MED ORDER — ACETAMINOPHEN 10 MG/ML IV SOLN: 1000.0000 mg | Freq: Once | INTRAVENOUS | Status: DC | PRN

## 2024-03-04 MED ORDER — FENTANYL CITRATE (PF) 100 MCG/2ML IJ SOLN: 25.0000 ug | INTRAMUSCULAR | Status: DC | PRN

## 2024-03-04 MED ORDER — PROPOFOL 10 MG/ML IV BOLUS
INTRAVENOUS | Status: DC | PRN
  Administered 2024-03-04: 150 mg via INTRAVENOUS

## 2024-03-04 MED ORDER — LIDOCAINE 2% (20 MG/ML) 5 ML SYRINGE
INTRAMUSCULAR | Status: AC
  Filled 2024-03-04: qty 5

## 2024-03-04 MED ORDER — DEXAMETHASONE SODIUM PHOSPHATE 10 MG/ML IJ SOLN
INTRAMUSCULAR | Status: AC
  Filled 2024-03-04: qty 1

## 2024-03-04 MED ORDER — CHLORHEXIDINE GLUCONATE 4 % EX SOLN: 60.0000 mL | Freq: Once | CUTANEOUS | Status: DC

## 2024-03-04 MED ORDER — 0.9 % SODIUM CHLORIDE (POUR BTL) OPTIME
TOPICAL | Status: DC | PRN
  Administered 2024-03-04: 200 mL

## 2024-03-04 MED ORDER — BUPIVACAINE HCL (PF) 0.25 % IJ SOLN
INTRAMUSCULAR | Status: DC | PRN
  Administered 2024-03-04: 10 mL via PERINEURAL
  Administered 2024-03-04: 15 mL via PERINEURAL

## 2024-03-04 MED ORDER — EPHEDRINE SULFATE (PRESSORS) 50 MG/ML IJ SOLN
INTRAMUSCULAR | Status: DC | PRN
Start: 2024-03-04 — End: 2024-03-04
  Administered 2024-03-04: 5 mg via INTRAVENOUS

## 2024-03-04 MED ORDER — MIDAZOLAM HCL 2 MG/2ML IJ SOLN
1.0000 mg | Freq: Once | INTRAMUSCULAR | Status: AC
  Administered 2024-03-04: 1 mg via INTRAVENOUS

## 2024-03-04 MED ORDER — VANCOMYCIN HCL 500 MG IV SOLR
INTRAVENOUS | Status: DC | PRN
  Administered 2024-03-04: 500 mg via TOPICAL

## 2024-03-04 MED ORDER — BUPIVACAINE-EPINEPHRINE (PF) 0.5% -1:200000 IJ SOLN
INTRAMUSCULAR | Status: DC | PRN
  Administered 2024-03-04: 10 mL via PERINEURAL
  Administered 2024-03-04: 15 mL via PERINEURAL

## 2024-03-04 SURGICAL SUPPLY — 62 items
BANDAGE ESMARK 6X9 LF (GAUZE/BANDAGES/DRESSINGS) IMPLANT
BIT DRILL 2.5X2.75 QC CALB (BIT) IMPLANT
BIT DRILL 2.9 CANN QC NONSTRL (BIT) IMPLANT
BIT DRILL 2.9X70 QC CALB (BIT) IMPLANT
BLADE SURG 15 STRL LF DISP TIS (BLADE) ×12 IMPLANT
BNDG COHESIVE 4X5 TAN STRL LF (GAUZE/BANDAGES/DRESSINGS) IMPLANT
BNDG ELASTIC 6X10 VLCR STRL LF (GAUZE/BANDAGES/DRESSINGS) ×3 IMPLANT
BNDG GAUZE DERMACEA FLUFF 4 (GAUZE/BANDAGES/DRESSINGS) ×3 IMPLANT
CANISTER SUCT 1200ML W/VALVE (MISCELLANEOUS) IMPLANT
CHLORAPREP W/TINT 26 (MISCELLANEOUS) ×3 IMPLANT
COVER BACK TABLE 60X90IN (DRAPES) ×3 IMPLANT
CUFF TRNQT CYL 34X4.125X (TOURNIQUET CUFF) ×3 IMPLANT
DRAPE C-ARM 42X72 X-RAY (DRAPES) ×3 IMPLANT
DRAPE C-ARMOR (DRAPES) ×3 IMPLANT
DRAPE EXTREMITY T 121X128X90 (DISPOSABLE) ×3 IMPLANT
DRAPE IMP U-DRAPE 54X76 (DRAPES) ×3 IMPLANT
DRAPE U-SHAPE 47X51 STRL (DRAPES) ×3 IMPLANT
DRSG MEPITEL 4X7.2 (GAUZE/BANDAGES/DRESSINGS) ×3 IMPLANT
ELECTRODE REM PT RTRN 9FT ADLT (ELECTROSURGICAL) ×3 IMPLANT
FIXATION ZIPTIGHT ANKLE SNDSMS (Ankle) IMPLANT
GAUZE PAD ABD 8X10 STRL (GAUZE/BANDAGES/DRESSINGS) ×3 IMPLANT
GAUZE SPONGE 4X4 12PLY STRL (GAUZE/BANDAGES/DRESSINGS) ×3 IMPLANT
GLOVE BIOGEL PI IND STRL 8 (GLOVE) ×3 IMPLANT
GLOVE SURG SS PI 7.5 STRL IVOR (GLOVE) ×6 IMPLANT
GOWN STRL REUS W/ TWL LRG LVL3 (GOWN DISPOSABLE) ×6 IMPLANT
KWIRE ACE 1.6X6 (WIRE) IMPLANT
MARKER SKIN DUAL TIP RULER LAB (MISCELLANEOUS) IMPLANT
NDL HYPO 25X1 1.5 SAFETY (NEEDLE) IMPLANT
NEEDLE HYPO 25X1 1.5 SAFETY (NEEDLE) IMPLANT
NS IRRIG 1000ML POUR BTL (IV SOLUTION) ×3 IMPLANT
PACK BASIN DAY SURGERY FS (CUSTOM PROCEDURE TRAY) ×3 IMPLANT
PAD CAST 4YDX4 CTTN HI CHSV (CAST SUPPLIES) ×3 IMPLANT
PADDING CAST ABS COTTON 4X4 ST (CAST SUPPLIES) IMPLANT
PADDING CAST SYNTHETIC 4X4 STR (CAST SUPPLIES) ×9 IMPLANT
PADDING CAST SYNTHETIC 6X4 NS (CAST SUPPLIES) ×6 IMPLANT
PENCIL SMOKE EVACUATOR (MISCELLANEOUS) ×3 IMPLANT
PLATE LOCK 7H 92 BILAT FIB (Plate) IMPLANT
SCREW ACE CAN 4.0 44M (Screw) IMPLANT
SCREW LOCK 3.5X12 DIST TIB (Screw) IMPLANT
SCREW LOCK CORT STAR 3.5X12 (Screw) IMPLANT
SCREW NLOCK CANC HEX 4X55 (Screw) IMPLANT
SCREW NON LOCKING LP 3.5 16MM (Screw) IMPLANT
SHEET MEDIUM DRAPE 40X70 STRL (DRAPES) ×3 IMPLANT
SLEEVE SCD COMPRESS KNEE MED (STOCKING) ×3 IMPLANT
SPIKE FLUID TRANSFER (MISCELLANEOUS) IMPLANT
SPLINT FIBERGLASS 4X30 (CAST SUPPLIES) ×6 IMPLANT
SPONGE T-LAP 18X18 ~~LOC~~+RFID (SPONGE) ×3 IMPLANT
STAPLER SKIN PROX WIDE 3.9 (STAPLE) ×3 IMPLANT
STOCKINETTE 6 STRL (DRAPES) IMPLANT
STOCKINETTE ORTHO 6X25 (MISCELLANEOUS) ×3 IMPLANT
SUCTION TUBE FRAZIER 10FR DISP (SUCTIONS) IMPLANT
SUT ETHILON 2 0 FS 18 (SUTURE) ×6 IMPLANT
SUT MNCRL AB 3-0 PS2 18 (SUTURE) IMPLANT
SUT PDS II 3-0 CT2 27 ABS (SUTURE) IMPLANT
SUT VIC AB 0 CT1 27XBRD ANBCTR (SUTURE) IMPLANT
SUT VIC AB 2-0 CT1 TAPERPNT 27 (SUTURE) ×3 IMPLANT
SUT VIC AB 3-0 SH 27X BRD (SUTURE) IMPLANT
SYR BULB IRRIG 60ML STRL (SYRINGE) ×3 IMPLANT
SYR CONTROL 10ML LL (SYRINGE) IMPLANT
TOWEL GREEN STERILE FF (TOWEL DISPOSABLE) ×6 IMPLANT
TUBE CONNECTING 20X1/4 (TUBING) IMPLANT
UNDERPAD 30X36 HEAVY ABSORB (UNDERPADS AND DIAPERS) ×3 IMPLANT

## 2024-03-04 NOTE — Anesthesia Procedure Notes (Signed)
 Anesthesia Regional Block: Adductor canal block   Pre-Anesthetic Checklist: , timeout performed,  Correct Patient, Correct Site, Correct Laterality,  Correct Procedure,, site marked,  Risks and benefits discussed,  Surgical consent,  Pre-op evaluation,  At surgeon's request and post-op pain management  Laterality: Right  Prep: chloraprep       Needles:  Injection technique: Single-shot  Needle Type: Echogenic Stimulator Needle     Needle Length: 10cm  Needle Gauge: 20     Additional Needles:   Procedures:,,,, ultrasound used (permanent image in chart),,    Narrative:  Start time: 03/04/2024 10:10 AM End time: 03/04/2024 10:20 AM Injection made incrementally with aspirations every 5 mL.  Performed by: Personally  Anesthesiologist: Peggi Bowels, MD  Additional Notes: Functioning IV was confirmed and monitors were applied. A time-out was performed. Hand hygiene and sterile gloves were used. The thigh was placed in a frog-leg position and prepped in a sterile fashion. A 20ga Bbraun echogenic stimulator needle was placed using ultrasound guidance.  Negative aspiration and negative test dose prior to incremental administration of local anesthetic. The patient tolerated the procedure well.

## 2024-03-04 NOTE — Op Note (Signed)
 03/04/2024  5:04 PM   PATIENT: Philip Hernandez  68 y.o. male  MRN: [id number]   PRE-OPERATIVE DIAGNOSIS:   Closed trimalleolar fracture of right ankle, initial encounter   POST-OPERATIVE DIAGNOSIS:   Closed trimalleolar fracture of right ankle, initial encounter   PROCEDURE: 1] Right bimalleolar ankle open reduction internal fixation (lateral and posterior malleoli) 2] Right ankle syndesmosis open reduction internal fixation   SURGEON:  Ali Ink, MD   ASSISTANT: None   ANESTHESIA: General, regional   EBL: Minimal   TOURNIQUET:    Total Tourniquet Time Documented: Thigh (Right) - 60 minutes Total: Thigh (Right) - 60 minutes    COMPLICATIONS: None apparent   DISPOSITION: Extubated, awake and stable to recovery.   INDICATION FOR PROCEDURE: The patient presented with above diagnosis.  We discussed the diagnosis, alternative treatment options, risks and benefits of the above surgical intervention, as well as alternative non-operative treatments. All questions/concerns were addressed and the patient/family demonstrated appropriate understanding of the diagnosis, the procedure, the postoperative course, and overall prognosis. The patient wished to proceed with surgical intervention and signed an informed surgical consent as such, in each others presence prior to surgery.   PROCEDURE IN DETAIL: After preoperative consent was obtained and the correct operative site was identified, the patient was brought to the operating room supine on stretcher and transferred onto operating table. General anesthesia was induced. Preoperative antibiotics were administered. Surgical timeout was taken. The patient was then positioned supine with an ipsilateral hip bump. The operative lower extremity was prepped and draped in standard sterile fashion with a tourniquet around the thigh. The extremity was exsanguinated and the tourniquet was inflated to 275 mmHg.  A standard lateral  incision was made over the distal fibula. Dissection was carried down to the level of the fibula and the fracture site identified. The superficial peroneal nerve was identified and protected throughout the procedure. The fibula was noted to be shortened with interposed periosteum. The fibula was brought out to length. The fibula fracture was debrided and the edges defined to achieve cortical read. Reduction maneuver was performed using pointed reduction forceps and lobster forceps. In this manner, the fibula length was restored and fracture reduced. A lag screw was not placed given the orientation of fracture lines and comminution. Due to poor bone quality and extensive comminution at the fracture site, it was decided to use a locking distal fibula plate. We then selected a Zimmer locking plate to match the anatomy of the distal fibula and placed it laterally. This was implanted under intraoperative fluoroscopy with a combination of distal locking screws and proximal cortical & locking screws.  We then proceeded with reduction and fixation of the posterior distal tibia fragment. We confirmed appropriate reduction using intraoperative fluoroscopy. The posterior distal tibia fragment was reduced directly using window thru the fibula fracture. An anterolateral approach was made over anterior ankle. Dissection carried down to level of distal tibia taking care throughout to protect the nearby tendons and neurovascular structures. A Kirschner wire was used to provisionally fix the fracture. This was overdrilled with cannulated drill and a 4.0 partially threaded screw was implanted. This was noted to achieve compression across the fracture with excellent congruency of the tibial articular surface on fluoroscopy. Position of this screw was verified carefully using intraoperative fluoroscopy throughout.    A manual external rotation stress radiograph was obtained and demonstrated widening of the ankle mortise. Given this  intraoperative finding as well as preoperative subluxation, it was decided to reduce and  fix the syndesmosis. Therefore a suture fixation system (ZipTight device) was implanted through the fibula plate in cannulated fashion to fix the syndesmosis. Anchor/button position was verified along anteromedial tibial cortex by fluoroscopy. A repeat stress radiograph showed complete stability of the ankle mortise to testing.  To further reinforce this, a nonlocking quadricortical hex head 4.0 mm screw was implanted through the fibula plate in appropriate fashion to fix the syndesmosis. Screw position was verified along anteromedial tibial cortex by fluoroscopy. A repeat stress radiograph showed maintained stability of the ankle mortise to testing. The ZipTight was retensioned appropriately to ensure no remaining slack of that implant.  The ankle joint was noted to be congruent with no gapping on deltoid ligament stress. The medial malleolus avulsion injury was deemed too small to receive internal fixation.  The surgical sites were thoroughly irrigated. The tourniquet was deflated and hemostasis achieved. Betadine and vancomycin powder were applied. The deep layers were closed using 2-0 vicryl. The skin was closed without tension.    The leg was cleaned with saline and sterile dressings with gauze were applied. A well padded bulky short leg splint was applied. The patient was awakened from anesthesia and transported to the recovery room in stable condition.    FOLLOW UP PLAN: -transfer to PACU, then home -strict NWB operative extremity, maximum elevation -maintain short leg splint until follow up -DVT ppx: Aspirin 81 mg twice daily while NWB -follow up as outpatient within 7-10 days for wound check with exchange of short leg splint to short leg cast -sutures out in 2-3 weeks in outpatient office   RADIOGRAPHS: AP, lateral, oblique and stress radiographs of the right ankle were obtained intraoperatively.  These showed interval reduction and fixation of the fractures. Manual stress radiographs were taken and the joints were noted to be stable following fixation. All hardware is appropriately positioned and of the appropriate lengths. No other acute injuries are noted.   Ali Ink Orthopaedic Surgery [facility]

## 2024-03-04 NOTE — Anesthesia Procedure Notes (Signed)
 Anesthesia Regional Block: Popliteal block   Pre-Anesthetic Checklist: , timeout performed,  Correct Patient, Correct Site, Correct Laterality,  Correct Procedure, Correct Position, site marked,  Risks and benefits discussed,  Surgical consent,  Pre-op evaluation,  At surgeon's request and post-op pain management  Laterality: Right  Prep: chloraprep       Needles:  Injection technique: Single-shot  Needle Type: Echogenic Stimulator Needle     Needle Length: 10cm  Needle Gauge: 20     Additional Needles:   Procedures:,,,, ultrasound used (permanent image in chart),,    Narrative:  Start time: 03/04/2024 10:00 AM End time: 03/04/2024 10:10 AM Injection made incrementally with aspirations every 5 mL.  Performed by: Personally  Anesthesiologist: Peggi Bowels, MD  Additional Notes: Functioning IV was confirmed and monitors were applied.  A timeout was performed. Sterile prep, hand hygiene and sterile gloves were used. A 20ga Bbraun echogenic stimulator needle was used. Negative aspiration and negative test dose prior to incremental administration of local anesthetic. The patient tolerated the procedure well.  Ultrasound guidance: relevent anatomy identified, needle position confirmed, local anesthetic spread visualized around nerve(s), vascular puncture avoided.  Image printed for medical record.

## 2024-03-04 NOTE — Progress Notes (Signed)
Assisted Dr. Ellender with right, adductor canal, popliteal, ultrasound guided block. Side rails up, monitors on throughout procedure. See vital signs in flow sheet. Tolerated Procedure well. ?

## 2024-03-04 NOTE — Anesthesia Postprocedure Evaluation (Signed)
 Anesthesia Post Note  Patient: Rolin Schult  Procedure(s) Performed: OPEN REDUCTION INTERNAL FIXATION (ORIF) ANKLE FRACTURE (Right: Ankle) REPAIR, SYNDESMOSIS, ANKLE (Right) REPAIR, LIGAMENT (Right: Ankle)     Patient location during evaluation: PACU Anesthesia Type: Regional and General Level of consciousness: awake Pain management: pain level controlled Vital Signs Assessment: post-procedure vital signs reviewed and stable Respiratory status: spontaneous breathing, nonlabored ventilation and respiratory function stable Cardiovascular status: blood pressure returned to baseline and stable Postop Assessment: no apparent nausea or vomiting Anesthetic complications: no   No notable events documented.  Last Vitals:  Vitals:   03/04/24 1245 03/04/24 1334  BP: 137/65 (!) 147/73  Pulse: (!) 58 62  Resp: 10 15  Temp:  (!) 36.2 C  SpO2: 99% 98%    Last Pain:  Vitals:   03/04/24 1334  TempSrc: Temporal  PainSc:                  Tommie Frame Avigdor Dollar

## 2024-03-04 NOTE — Anesthesia Preprocedure Evaluation (Addendum)
 Anesthesia Evaluation  Patient identified by MRN, date of birth, ID band Patient awake    Reviewed: Allergy & Precautions, NPO status , Patient's Chart, lab work & pertinent test results  Airway Mallampati: II  TM Distance: >3 FB Neck ROM: Full    Dental no notable dental hx.    Pulmonary  Sarcoidosis   Pulmonary exam normal        Cardiovascular negative cardio ROS Normal cardiovascular exam     Neuro/Psych  Neuromuscular disease  negative psych ROS   GI/Hepatic Neg liver ROS,GERD  Medicated and Controlled,,  Endo/Other  negative endocrine ROS    Renal/GU negative Renal ROS     Musculoskeletal  (+) Arthritis ,    Abdominal   Peds  Hematology negative hematology ROS (+)   Anesthesia Other Findings Closed trimalleolar fracture of right ankle  Reproductive/Obstetrics                             Anesthesia Physical Anesthesia Plan  ASA: 2  Anesthesia Plan: General and Regional   Post-op Pain Management: Regional block*   Induction: Intravenous  PONV Risk Score and Plan: 2 and Ondansetron , Dexamethasone  and Treatment may vary due to age or medical condition  Airway Management Planned: LMA  Additional Equipment:   Intra-op Plan:   Post-operative Plan: Extubation in OR  Informed Consent: I have reviewed the patients History and Physical, chart, labs and discussed the procedure including the risks, benefits and alternatives for the proposed anesthesia with the patient or authorized representative who has indicated his/her understanding and acceptance.     Dental advisory given  Plan Discussed with: CRNA and Surgeon  Anesthesia Plan Comments:        Anesthesia Quick Evaluation

## 2024-03-04 NOTE — H&P (Signed)

## 2024-03-04 NOTE — Transfer of Care (Signed)
 Immediate Anesthesia Transfer of Care Note  Patient: Philip Hernandez  Procedure(s) Performed: OPEN REDUCTION INTERNAL FIXATION (ORIF) ANKLE FRACTURE (Right: Ankle) REPAIR, SYNDESMOSIS, ANKLE (Right) REPAIR, LIGAMENT (Right: Ankle)  Patient Location: PACU  Anesthesia Type:GA combined with regional for post-op pain  Level of Consciousness: drowsy  Airway & Oxygen Therapy: Patient Spontanous Breathing and Patient connected to face mask oxygen  Post-op Assessment: Report given to RN and Post -op Vital signs reviewed and stable  Post vital signs: Reviewed and stable  Last Vitals:  Vitals Value Taken Time  BP 126/60 03/04/24 12:29  Temp    Pulse 56 03/04/24 12:30  Resp 14 03/04/24 12:30  SpO2 98 % 03/04/24 12:30  Vitals shown include unfiled device data.  Last Pain:  Vitals:   03/04/24 0917  TempSrc: Temporal  PainSc: 0-No pain      Patients Stated Pain Goal: 3 (03/04/24 0917)  Complications: No notable events documented.

## 2024-03-04 NOTE — Anesthesia Procedure Notes (Signed)
 Procedure Name: LMA Insertion Date/Time: 03/04/2024 10:52 AM  Performed by: Noralyn Beams, CRNAPre-anesthesia Checklist: Patient identified, Emergency Drugs available, Suction available and Patient being monitored Patient Re-evaluated:Patient Re-evaluated prior to induction Oxygen Delivery Method: Circle system utilized Preoxygenation: Pre-oxygenation with 100% oxygen Induction Type: IV induction Ventilation: Mask ventilation without difficulty LMA: LMA inserted LMA Size: 5.0 Number of attempts: 1 Airway Equipment and Method: Bite block Placement Confirmation: positive ETCO2 Tube secured with: Tape Dental Injury: Teeth and Oropharynx as per pre-operative assessment

## 2024-03-05 ENCOUNTER — Encounter (HOSPITAL_BASED_OUTPATIENT_CLINIC_OR_DEPARTMENT_OTHER): Payer: Self-pay | Admitting: Orthopaedic Surgery

## 2024-03-13 DIAGNOSIS — S82841D Displaced bimalleolar fracture of right lower leg, subsequent encounter for closed fracture with routine healing: Secondary | ICD-10-CM | POA: Diagnosis not present

## 2024-03-18 ENCOUNTER — Ambulatory Visit (AMBULATORY_SURGERY_CENTER)

## 2024-03-18 VITALS — Ht 70.0 in | Wt 180.0 lb

## 2024-03-18 DIAGNOSIS — Z860100 Personal history of colon polyps, unspecified: Secondary | ICD-10-CM

## 2024-03-18 DIAGNOSIS — Z800 Family history of malignant neoplasm of digestive organs: Secondary | ICD-10-CM

## 2024-03-18 MED ORDER — NA SULFATE-K SULFATE-MG SULF 17.5-3.13-1.6 GM/177ML PO SOLN: 1.0000 | Freq: Once | ORAL | 0 refills | Status: AC

## 2024-03-18 NOTE — Progress Notes (Signed)

## 2024-03-26 ENCOUNTER — Encounter: Payer: Self-pay | Admitting: Gastroenterology

## 2024-03-31 DIAGNOSIS — Z85828 Personal history of other malignant neoplasm of skin: Secondary | ICD-10-CM | POA: Diagnosis not present

## 2024-03-31 DIAGNOSIS — L821 Other seborrheic keratosis: Secondary | ICD-10-CM | POA: Diagnosis not present

## 2024-03-31 DIAGNOSIS — L853 Xerosis cutis: Secondary | ICD-10-CM | POA: Diagnosis not present

## 2024-03-31 DIAGNOSIS — L814 Other melanin hyperpigmentation: Secondary | ICD-10-CM | POA: Diagnosis not present

## 2024-03-31 DIAGNOSIS — Z08 Encounter for follow-up examination after completed treatment for malignant neoplasm: Secondary | ICD-10-CM | POA: Diagnosis not present

## 2024-03-31 DIAGNOSIS — L570 Actinic keratosis: Secondary | ICD-10-CM | POA: Diagnosis not present

## 2024-03-31 DIAGNOSIS — R202 Paresthesia of skin: Secondary | ICD-10-CM | POA: Diagnosis not present

## 2024-03-31 DIAGNOSIS — D225 Melanocytic nevi of trunk: Secondary | ICD-10-CM | POA: Diagnosis not present

## 2024-04-01 ENCOUNTER — Encounter: Admitting: Gastroenterology

## 2024-04-03 DIAGNOSIS — S82841A Displaced bimalleolar fracture of right lower leg, initial encounter for closed fracture: Secondary | ICD-10-CM | POA: Diagnosis not present

## 2024-04-07 DIAGNOSIS — S82841A Displaced bimalleolar fracture of right lower leg, initial encounter for closed fracture: Secondary | ICD-10-CM | POA: Diagnosis not present

## 2024-04-10 ENCOUNTER — Encounter: Admitting: Gastroenterology

## 2024-04-21 DIAGNOSIS — S82841D Displaced bimalleolar fracture of right lower leg, subsequent encounter for closed fracture with routine healing: Secondary | ICD-10-CM | POA: Diagnosis not present

## 2024-04-23 DIAGNOSIS — M25571 Pain in right ankle and joints of right foot: Secondary | ICD-10-CM | POA: Diagnosis not present

## 2024-04-23 DIAGNOSIS — M25671 Stiffness of right ankle, not elsewhere classified: Secondary | ICD-10-CM | POA: Diagnosis not present

## 2024-04-27 DIAGNOSIS — M25671 Stiffness of right ankle, not elsewhere classified: Secondary | ICD-10-CM | POA: Diagnosis not present

## 2024-04-27 DIAGNOSIS — M25571 Pain in right ankle and joints of right foot: Secondary | ICD-10-CM | POA: Diagnosis not present

## 2024-05-01 DIAGNOSIS — M25671 Stiffness of right ankle, not elsewhere classified: Secondary | ICD-10-CM | POA: Diagnosis not present

## 2024-05-01 DIAGNOSIS — M25571 Pain in right ankle and joints of right foot: Secondary | ICD-10-CM | POA: Diagnosis not present

## 2024-05-04 DIAGNOSIS — M25671 Stiffness of right ankle, not elsewhere classified: Secondary | ICD-10-CM | POA: Diagnosis not present

## 2024-05-04 DIAGNOSIS — M25571 Pain in right ankle and joints of right foot: Secondary | ICD-10-CM | POA: Diagnosis not present

## 2024-05-07 DIAGNOSIS — M25571 Pain in right ankle and joints of right foot: Secondary | ICD-10-CM | POA: Diagnosis not present

## 2024-05-07 DIAGNOSIS — M25671 Stiffness of right ankle, not elsewhere classified: Secondary | ICD-10-CM | POA: Diagnosis not present

## 2024-05-11 DIAGNOSIS — M25671 Stiffness of right ankle, not elsewhere classified: Secondary | ICD-10-CM | POA: Diagnosis not present

## 2024-05-11 DIAGNOSIS — M25511 Pain in right shoulder: Secondary | ICD-10-CM | POA: Diagnosis not present

## 2024-05-13 DIAGNOSIS — M25671 Stiffness of right ankle, not elsewhere classified: Secondary | ICD-10-CM | POA: Diagnosis not present

## 2024-05-13 DIAGNOSIS — M25511 Pain in right shoulder: Secondary | ICD-10-CM | POA: Diagnosis not present

## 2024-05-20 DIAGNOSIS — M25671 Stiffness of right ankle, not elsewhere classified: Secondary | ICD-10-CM | POA: Diagnosis not present

## 2024-05-20 DIAGNOSIS — M25511 Pain in right shoulder: Secondary | ICD-10-CM | POA: Diagnosis not present

## 2024-05-20 DIAGNOSIS — M25571 Pain in right ankle and joints of right foot: Secondary | ICD-10-CM | POA: Diagnosis not present

## 2024-05-22 DIAGNOSIS — M25571 Pain in right ankle and joints of right foot: Secondary | ICD-10-CM | POA: Diagnosis not present

## 2024-05-22 DIAGNOSIS — M25671 Stiffness of right ankle, not elsewhere classified: Secondary | ICD-10-CM | POA: Diagnosis not present

## 2024-05-25 DIAGNOSIS — M25571 Pain in right ankle and joints of right foot: Secondary | ICD-10-CM | POA: Diagnosis not present

## 2024-05-25 DIAGNOSIS — M25671 Stiffness of right ankle, not elsewhere classified: Secondary | ICD-10-CM | POA: Diagnosis not present

## 2024-05-28 DIAGNOSIS — M25671 Stiffness of right ankle, not elsewhere classified: Secondary | ICD-10-CM | POA: Diagnosis not present

## 2024-05-28 DIAGNOSIS — M25571 Pain in right ankle and joints of right foot: Secondary | ICD-10-CM | POA: Diagnosis not present

## 2024-05-29 ENCOUNTER — Encounter: Payer: Self-pay | Admitting: Gastroenterology

## 2024-06-01 DIAGNOSIS — M25571 Pain in right ankle and joints of right foot: Secondary | ICD-10-CM | POA: Diagnosis not present

## 2024-06-01 DIAGNOSIS — M25671 Stiffness of right ankle, not elsewhere classified: Secondary | ICD-10-CM | POA: Diagnosis not present

## 2024-06-03 ENCOUNTER — Other Ambulatory Visit: Payer: Self-pay | Admitting: Family Medicine

## 2024-06-03 DIAGNOSIS — M25571 Pain in right ankle and joints of right foot: Secondary | ICD-10-CM | POA: Diagnosis not present

## 2024-06-03 DIAGNOSIS — M25671 Stiffness of right ankle, not elsewhere classified: Secondary | ICD-10-CM | POA: Diagnosis not present

## 2024-06-08 DIAGNOSIS — M25571 Pain in right ankle and joints of right foot: Secondary | ICD-10-CM | POA: Diagnosis not present

## 2024-06-08 DIAGNOSIS — M25671 Stiffness of right ankle, not elsewhere classified: Secondary | ICD-10-CM | POA: Diagnosis not present

## 2024-06-08 DIAGNOSIS — S82841A Displaced bimalleolar fracture of right lower leg, initial encounter for closed fracture: Secondary | ICD-10-CM | POA: Diagnosis not present

## 2024-06-15 ENCOUNTER — Other Ambulatory Visit

## 2024-06-15 DIAGNOSIS — K219 Gastro-esophageal reflux disease without esophagitis: Secondary | ICD-10-CM

## 2024-06-15 DIAGNOSIS — E782 Mixed hyperlipidemia: Secondary | ICD-10-CM | POA: Diagnosis not present

## 2024-06-15 DIAGNOSIS — D860 Sarcoidosis of lung: Secondary | ICD-10-CM | POA: Diagnosis not present

## 2024-06-16 ENCOUNTER — Telehealth: Payer: Self-pay | Admitting: Adult Health

## 2024-06-16 LAB — LIPID PANEL
Chol/HDL Ratio: 3.6 ratio (ref 0.0–5.0)
Cholesterol, Total: 129 mg/dL (ref 100–199)
HDL: 36 mg/dL — ABNORMAL LOW (ref >39)
LDL Chol Calc (NIH): 74 mg/dL (ref 0–99)
Triglycerides: 99 mg/dL (ref 0–149)
VLDL Cholesterol Cal: 19 mg/dL (ref 5–40)

## 2024-06-16 LAB — CMP14+EGFR
ALT: 23 IU/L (ref 0–44)
AST: 20 IU/L (ref 0–40)
Albumin: 4.2 g/dL (ref 3.9–4.9)
Alkaline Phosphatase: 105 IU/L (ref 47–123)
BUN/Creatinine Ratio: 9 — ABNORMAL LOW (ref 10–24)
BUN: 11 mg/dL (ref 8–27)
Bilirubin Total: 0.7 mg/dL (ref 0.0–1.2)
CO2: 23 mmol/L (ref 20–29)
Calcium: 9.4 mg/dL (ref 8.6–10.2)
Chloride: 105 mmol/L (ref 96–106)
Creatinine, Ser: 1.24 mg/dL (ref 0.76–1.27)
Globulin, Total: 2.2 g/dL (ref 1.5–4.5)
Glucose: 94 mg/dL (ref 70–99)
Potassium: 5.2 mmol/L (ref 3.5–5.2)
Sodium: 141 mmol/L (ref 134–144)
Total Protein: 6.4 g/dL (ref 6.0–8.5)
eGFR: 64 mL/min/1.73 (ref >59)

## 2024-06-16 LAB — CBC WITH DIFFERENTIAL/PLATELET
Basophils Absolute: 0.1 x10E3/uL (ref 0.0–0.2)
Basos: 1 %
EOS (ABSOLUTE): 0.3 x10E3/uL (ref 0.0–0.4)
Eos: 5 %
Hematocrit: 42.1 % (ref 37.5–51.0)
Hemoglobin: 13.8 g/dL (ref 13.0–17.7)
Immature Grans (Abs): 0 x10E3/uL (ref 0.0–0.1)
Immature Granulocytes: 0 %
Lymphocytes Absolute: 1.5 x10E3/uL (ref 0.7–3.1)
Lymphs: 25 %
MCH: 28.6 pg (ref 26.6–33.0)
MCHC: 32.8 g/dL (ref 31.5–35.7)
MCV: 87 fL (ref 79–97)
Monocytes Absolute: 0.6 x10E3/uL (ref 0.1–0.9)
Monocytes: 11 %
Neutrophils Absolute: 3.6 x10E3/uL (ref 1.4–7.0)
Neutrophils: 58 %
Platelets: 166 x10E3/uL (ref 150–450)
RBC: 4.82 x10E6/uL (ref 4.14–5.80)
RDW: 13.3 % (ref 11.6–15.4)
WBC: 6.1 x10E3/uL (ref 3.4–10.8)

## 2024-06-16 LAB — PSA, TOTAL AND FREE
PSA, Free: <0.02 ng/mL
Prostate Specific Ag, Serum: <0.1 ng/mL (ref 0.0–4.0)

## 2024-06-16 MED ORDER — GABAPENTIN 800 MG PO TABS: 800.0000 mg | ORAL_TABLET | Freq: Four times a day (QID) | ORAL | 3 refills | Status: AC

## 2024-06-16 NOTE — Telephone Encounter (Signed)
 Pt called to get refill on medication  Pt state insurance will pay for 100 day supply   for Pt medication so Pt is calling to see if MD can place order for Pt to get get 100 day supply of  gabapentin  (NEURONTIN ) 800 MG tablet  Instead of 90 days   Pt would like medication sent to    : [facility]   Address: [address] [place], [place] [address] Phone: [phone]

## 2024-06-19 ENCOUNTER — Ambulatory Visit: Admitting: Family Medicine

## 2024-06-19 ENCOUNTER — Encounter: Payer: Self-pay | Admitting: Family Medicine

## 2024-06-19 VITALS — BP 154/82 | HR 52 | Temp 97.2°F | Ht 70.0 in | Wt 189.0 lb

## 2024-06-19 DIAGNOSIS — E782 Mixed hyperlipidemia: Secondary | ICD-10-CM

## 2024-06-19 DIAGNOSIS — I10 Essential (primary) hypertension: Secondary | ICD-10-CM | POA: Diagnosis not present

## 2024-06-19 DIAGNOSIS — Z0001 Encounter for general adult medical examination with abnormal findings: Secondary | ICD-10-CM | POA: Diagnosis not present

## 2024-06-19 DIAGNOSIS — Z23 Encounter for immunization: Secondary | ICD-10-CM | POA: Diagnosis not present

## 2024-06-19 DIAGNOSIS — K219 Gastro-esophageal reflux disease without esophagitis: Secondary | ICD-10-CM | POA: Diagnosis not present

## 2024-06-19 DIAGNOSIS — Z0000 Encounter for general adult medical examination without abnormal findings: Secondary | ICD-10-CM

## 2024-06-19 MED ORDER — ROSUVASTATIN CALCIUM 10 MG PO TABS: 10.0000 mg | ORAL_TABLET | Freq: Every day | ORAL | 3 refills | Status: AC

## 2024-06-19 NOTE — Progress Notes (Signed)
 BP (!) 154/82   Pulse (!) 52   Temp (!) 97.2 F (36.2 C)   Ht 5' 10 (1.778 m)   Wt 189 lb (85.7 kg)   SpO2 99%   BMI 27.12 kg/m    Subjective:   Patient ID: Philip Hernandez, male    DOB: [birth year], 68 y.o.   MRN: [id number]  HPI: Philip Hernandez is a 68 y.o. male presenting on 06/19/2024 for Annual Exam   Discussed the use of AI scribe software for clinical note transcription with the patient, who gave verbal consent to proceed.  History of Present Illness   Philip Hernandez is a 68 year old male who presents for an annual physical exam and checkup.  Hematologic and metabolic laboratory findings - Hemoglobin and platelet levels have improved without specific lifestyle changes - Cholesterol levels, including LDL and triglycerides, are within normal limits - HDL cholesterol remains low - Kidney, liver, and blood glucose levels are within normal limits - Consumes fish such as salmon and white fish to help with cholesterol management  Hypertension and blood pressure management - Blood pressure has been slightly elevated over the past year, with recent readings around 154/82 mmHg - Not currently taking antihypertensive medication - Prefers lifestyle modifications for blood pressure control, including reducing sodium intake - Does not add salt to food and chooses low sodium products - Concerned about dietary impact on blood pressure - Considering weight loss as a strategy for blood pressure management  Musculoskeletal injury and reduced physical activity - Sustained a broken ankle in June, resulting in decreased physical activity - Weight gain of approximately nine pounds since injury - Currently recovering from ankle injury - Scheduled for screw removal from ankle on October 15 - Previously participated in physical therapy and plans to resume therapy after screw removal to regain strength and mobility  Prostate cancer surveillance - History of prostate surgery - PSA level  currently 0.1 - PSA levels showed a slight increase in 2018, leading to radiation treatment - PSA levels have remained low but detectable since treatment - Under regular surveillance by urology  Colorectal cancer screening - Colonoscopy scheduled for December - Colonoscopy was postponed due to concerns about infection risk related to recent ankle surgery  Ear hygiene and hearing aid use - Uses Q-tips to clean ears despite previous advice against this practice - No current issues with earwax buildup - History of hearing aid use, which can contribute to earwax accumulation          Relevant past medical, surgical, family and social history reviewed and updated as indicated. Interim medical history since our last visit reviewed. Allergies and medications reviewed and updated.  Review of Systems  Constitutional:  Negative for chills and fever.  HENT:  Negative for ear pain and tinnitus.   Eyes:  Negative for pain and discharge.  Respiratory:  Negative for cough, shortness of breath and wheezing.   Cardiovascular:  Negative for chest pain, palpitations and leg swelling.  Gastrointestinal:  Negative for abdominal pain, blood in stool, constipation and diarrhea.  Genitourinary:  Negative for dysuria and hematuria.  Musculoskeletal:  Positive for arthralgias. Negative for back pain, gait problem and myalgias.  Skin:  Negative for rash.  Neurological:  Negative for dizziness, weakness and headaches.  Psychiatric/Behavioral:  Negative for suicidal ideas.   All other systems reviewed and are negative.   Per HPI unless specifically indicated above   Allergies as of 06/19/2024       Reactions  Corn-containing Products Anaphylaxis        Medication List        Accurate as of June 19, 2024 11:23 AM. If you have any questions, ask your nurse or doctor.          acetaminophen  500 MG tablet Commonly known as: TYLENOL  Take 500 mg by mouth every 6 (six) hours as needed.    AIRBORNE PO Take by mouth daily.   aspirin EC 81 MG tablet Take 81 mg by mouth 2 (two) times daily.   cetirizine 10 MG tablet Commonly known as: ZYRTEC Take 10 mg by mouth daily.   cholecalciferol 1000 units tablet Commonly known as: VITAMIN D Take 1,000 Units by mouth daily.   Colace 100 MG capsule Generic drug: docusate sodium Take 1 capsule by mouth 2 (two) times daily as needed.   gabapentin  800 MG tablet Commonly known as: NEURONTIN  Take 1 tablet (800 mg total) by mouth 4 (four) times daily.   HYDROcodone -acetaminophen  5-325 MG tablet Commonly known as: NORCO/VICODIN Take 1 tablet by mouth every 6 (six) hours as needed for moderate pain (pain score 4-6).   omeprazole  40 MG capsule Commonly known as: PRILOSEC TAKE ONE CAPSULE BY MOUTH TWICE A DAY   oxycodone  5 MG capsule Commonly known as: OXY-IR Take 5 mg by mouth every 4 (four) hours as needed.   rosuvastatin  10 MG tablet Commonly known as: CRESTOR  Take 1 tablet (10 mg total) by mouth daily. What changed: how much to take Changed by: Fonda LABOR Janthony Holleman         Objective:   BP (!) 154/82   Pulse (!) 52   Temp (!) 97.2 F (36.2 C)   Ht 5' 10 (1.778 m)   Wt 189 lb (85.7 kg)   SpO2 99%   BMI 27.12 kg/m   Wt Readings from Last 3 Encounters:  06/19/24 189 lb (85.7 kg)  03/18/24 180 lb (81.6 kg)  03/04/24 182 lb 5.1 oz (82.7 kg)    Physical Exam Physical Exam   VITALS: BP- 154/82 HEENT: Throat clear. Ears clear bilaterally. NECK: Thyroid  without masses. CHEST: Lungs clear to auscultation. CARDIOVASCULAR: Heart regular rate and rhythm. ABDOMEN: Abdomen non-tender. NEUROLOGICAL: Reflexes normal.       Results for orders placed or performed in visit on 06/15/24  PSA, total and free   Collection Time: 06/15/24 10:01 AM  Result Value Ref Range   Prostate Specific Ag, Serum <0.1 0.0 - 4.0 ng/mL   PSA, Free <0.02 N/A ng/mL   PSA, Free Pct CANCELED %  Lipid panel   Collection Time: 06/15/24  10:01 AM  Result Value Ref Range   Cholesterol, Total 129 100 - 199 mg/dL   Triglycerides 99 0 - 149 mg/dL   HDL 36 (L) >60 mg/dL   VLDL Cholesterol Cal 19 5 - 40 mg/dL   LDL Chol Calc (NIH) 74 0 - 99 mg/dL   Chol/HDL Ratio 3.6 0.0 - 5.0 ratio  CMP14+EGFR   Collection Time: 06/15/24 10:01 AM  Result Value Ref Range   Glucose 94 70 - 99 mg/dL   BUN 11 8 - 27 mg/dL   Creatinine, Ser 8.75 0.76 - 1.27 mg/dL   eGFR 64 >40 fO/fpw/8.26   BUN/Creatinine Ratio 9 (L) 10 - 24   Sodium 141 134 - 144 mmol/L   Potassium 5.2 3.5 - 5.2 mmol/L   Chloride 105 96 - 106 mmol/L   CO2 23 20 - 29 mmol/L   Calcium  9.4 8.6 - 10.2  mg/dL   Total Protein 6.4 6.0 - 8.5 g/dL   Albumin 4.2 3.9 - 4.9 g/dL   Globulin, Total 2.2 1.5 - 4.5 g/dL   Bilirubin Total 0.7 0.0 - 1.2 mg/dL   Alkaline Phosphatase 105 47 - 123 IU/L   AST 20 0 - 40 IU/L   ALT 23 0 - 44 IU/L  CBC with Differential/Platelet   Collection Time: 06/15/24 10:01 AM  Result Value Ref Range   WBC 6.1 3.4 - 10.8 x10E3/uL   RBC 4.82 4.14 - 5.80 x10E6/uL   Hemoglobin 13.8 13.0 - 17.7 g/dL   Hematocrit 57.8 62.4 - 51.0 %   MCV 87 79 - 97 fL   MCH 28.6 26.6 - 33.0 pg   MCHC 32.8 31.5 - 35.7 g/dL   RDW 86.6 88.3 - 84.5 %   Platelets 166 150 - 450 x10E3/uL   Neutrophils 58 Not Estab. %   Lymphs 25 Not Estab. %   Monocytes 11 Not Estab. %   Eos 5 Not Estab. %   Basos 1 Not Estab. %   Neutrophils Absolute 3.6 1.4 - 7.0 x10E3/uL   Lymphocytes Absolute 1.5 0.7 - 3.1 x10E3/uL   Monocytes Absolute 0.6 0.1 - 0.9 x10E3/uL   EOS (ABSOLUTE) 0.3 0.0 - 0.4 x10E3/uL   Basophils Absolute 0.1 0.0 - 0.2 x10E3/uL   Immature Granulocytes 0 Not Estab. %   Immature Grans (Abs) 0.0 0.0 - 0.1 x10E3/uL    Assessment & Plan:   Problem List Items Addressed This Visit       Cardiovascular and Mediastinum   Primary hypertension   Relevant Medications   rosuvastatin  (CRESTOR ) 10 MG tablet   Other Relevant Orders   CBC with Differential/Platelet   CMP14+EGFR    Lipid panel     Digestive   GERD   Relevant Orders   CBC with Differential/Platelet   CMP14+EGFR   Lipid panel     Other   HLD (hyperlipidemia)   Relevant Medications   rosuvastatin  (CRESTOR ) 10 MG tablet   Other Relevant Orders   CBC with Differential/Platelet   CMP14+EGFR   Lipid panel   Other Visit Diagnoses       Physical exam    -  Primary   Relevant Orders   CBC with Differential/Platelet   CMP14+EGFR   Lipid panel     Encounter for immunization       Relevant Orders   Flu vaccine HIGH DOSE PF(Fluzone Trivalent) (Completed)          Adult Wellness Visit Routine wellness visit with satisfactory blood work. Hemoglobin, platelet levels improved. PSA low. Cholesterol managed, LDL and triglycerides acceptable, HDL low. Normal kidney, liver, blood sugar, and potassium levels. - Encourage dietary and lifestyle modifications, including reducing sodium intake, increasing physical activity, and weight loss as appropriate. - Encourage consumption of fish to improve HDL levels.  Hypertension Blood pressure elevated, consistently high over past year. Declines medication, prefers lifestyle management. Discussed risks of prolonged hypertension. - Monitor blood pressure at home weekly and record readings. - Focus on reducing dietary sodium intake. - Encourage weight loss to potentially lower blood pressure. - Reassess blood pressure management in future visits.  Mixed hyperlipidemia Cholesterol levels managed, LDL and triglycerides acceptable, chronic low HDL. Current medication effective. - Continue current cholesterol medication. - Encourage dietary modifications to improve HDL levels, such as increased fish intake.  Status post right ankle fracture with hardware and pending screw removal Reports stiffness and swelling. Scheduled for screw removal due  to shifted screw causing stiffness. - Proceed with scheduled screw removal on October 15th. - Resume physical therapy  post-screw removal to improve ankle movement and strength.  Prostate cancer, post-prostatectomy, with ongoing PSA surveillance Post-prostatectomy with low, stable PSA levels. Previous radiation therapy did not eliminate PSA completely. Urologist monitors PSA every six months. - Continue PSA surveillance with urologist every six months.  General Health Maintenance Colonoscopy scheduled for December, postponed from October due to potential infection risk related to recent ankle surgery. - Proceed with colonoscopy in December.          Follow up plan: Return in about 6 months (around 12/18/2024), or if symptoms worsen or fail to improve, for Hypertension and hyperlipidemia recheck.  Counseling provided for all of the vaccine components Orders Placed This Encounter  Procedures   Flu vaccine HIGH DOSE PF(Fluzone Trivalent)   CBC with Differential/Platelet   CMP14+EGFR   Lipid panel    Fonda Levins, MD [facility] 06/19/2024, 11:23 AM

## 2024-06-23 ENCOUNTER — Ambulatory Visit

## 2024-06-23 ENCOUNTER — Encounter (HOSPITAL_BASED_OUTPATIENT_CLINIC_OR_DEPARTMENT_OTHER): Payer: Self-pay | Admitting: Orthopaedic Surgery

## 2024-06-23 ENCOUNTER — Other Ambulatory Visit: Payer: Self-pay

## 2024-06-23 VITALS — BP 154/82 | HR 52 | Ht 70.0 in | Wt 189.0 lb

## 2024-06-23 DIAGNOSIS — Z0000 Encounter for general adult medical examination without abnormal findings: Secondary | ICD-10-CM

## 2024-06-23 NOTE — Patient Instructions (Signed)
 [name],  Thank you for taking the time for your Medicare Wellness Visit. I appreciate your continued commitment to your health goals. Please review the care plan we discussed, and feel free to reach out if I can assist you further.  Medicare recommends these wellness visits once per year to help you and your care team stay ahead of potential health issues. These visits are designed to focus on prevention, allowing your provider to concentrate on managing your acute and chronic conditions during your regular appointments.  Please note that Annual Wellness Visits do not include a physical exam. Some assessments may be limited, especially if the visit was conducted virtually. If needed, we may recommend a separate in-person follow-up with your provider.  Ongoing Care Seeing your primary care provider every 3 to 6 months helps us  monitor your health and provide consistent, personalized care.   Referrals If a referral was made during today's visit and you haven't received any updates within two weeks, please contact the referred provider directly to check on the status.  Recommended Screenings:  Health Maintenance  Topic Date Due   COVID-19 Vaccine (4 - 2025-26 season) 07/05/2024*   Medicare Annual Wellness Visit  07/20/2024*   Zoster (Shingles) Vaccine (1 of 2) 09/19/2024*   Pneumococcal Vaccine for age over 83 (1 of 1 - PCV) 06/19/2025*   Colon Cancer Screening  06/19/2025*   DTaP/Tdap/Td vaccine (2 - Td or Tdap) 12/13/2027   Flu Shot  Completed   Hepatitis C Screening  Completed   Meningitis B Vaccine  Aged Out  *Topic was postponed. The date shown is not the original due date.       06/23/2024    8:46 AM  Advanced Directives  Does Patient Have a Medical Advance Directive? No   Advance Care Planning is important because it: Ensures you receive medical care that aligns with your values, goals, and preferences. Provides guidance to your family and loved ones, reducing the  emotional burden of decision-making during critical moments.  Vision: Annual vision screenings are recommended for early detection of glaucoma, cataracts, and diabetic retinopathy. These exams can also reveal signs of chronic conditions such as diabetes and high blood pressure.  Dental: Annual dental screenings help detect early signs of oral cancer, gum disease, and other conditions linked to overall health, including heart disease and diabetes.  Please see the attached documents for additional preventive care recommendations.

## 2024-06-23 NOTE — Progress Notes (Signed)
 Subjective:   Philip Hernandez is a 68 y.o. who presents for a Medicare Wellness preventive visit.  As a reminder, Annual Wellness Visits don't include a physical exam, and some assessments may be limited, especially if this visit is performed virtually. We may recommend an in-person follow-up visit with your provider if needed.  Visit Complete: Virtual I connected with  Philip Hernandez on 06/23/24 by a audio enabled telemedicine application and verified that I am speaking with the correct person using two identifiers.  Patient Location: Home  Provider Location: Home Office  I discussed the limitations of evaluation and management by telemedicine. The patient expressed understanding and agreed to proceed.  Vital Signs: Because this visit was a virtual/telehealth visit, some criteria may be missing or patient reported. Any vitals not documented were not able to be obtained and vitals that have been documented are patient reported.  VideoDeclined- This patient declined Librarian, academic. Therefore the visit was completed with audio only.  Persons Participating in Visit: Patient.  AWV Questionnaire: No: Patient Medicare AWV questionnaire was not completed prior to this visit.  Cardiac Risk Factors include: advanced age (>38men, >13 women);dyslipidemia;hypertension;male gender     Objective:    Today's Vitals   06/23/24 0835 06/23/24 0840  BP: (!) 154/82   Pulse: (!) 52   Weight: 189 lb (85.7 kg)   Height: 5' 10 (1.778 m)   PainSc:  10-Worst pain ever   Body mass index is 27.12 kg/m.     06/23/2024    8:46 AM 03/04/2024    9:14 AM 02/26/2024   11:25 AM 06/26/2023    7:19 AM 06/19/2023    1:53 PM 12/12/2022   12:52 PM 06/11/2022    7:03 AM  Advanced Directives  Does Patient Have a Medical Advance Directive? No No No No No No No  Would patient like information on creating a medical advance directive?  No - Patient declined  No - Patient declined  No -  Patient declined No - Patient declined    Current Medications (verified) Outpatient Encounter Medications as of 06/23/2024  Medication Sig   acetaminophen  (TYLENOL ) 500 MG tablet Take 500 mg by mouth every 6 (six) hours as needed.   cetirizine (ZYRTEC) 10 MG tablet Take 10 mg by mouth daily.   cholecalciferol (VITAMIN D) 1000 UNITS tablet Take 1,000 Units by mouth daily.   gabapentin  (NEURONTIN ) 800 MG tablet Take 1 tablet (800 mg total) by mouth 4 (four) times daily.   HYDROcodone -acetaminophen  (NORCO/VICODIN) 5-325 MG tablet Take 1 tablet by mouth every 6 (six) hours as needed for moderate pain (pain score 4-6).   Multiple Vitamins-Minerals (AIRBORNE PO) Take by mouth daily.   omeprazole  (PRILOSEC) 40 MG capsule TAKE ONE CAPSULE BY MOUTH TWICE A DAY   rosuvastatin  (CRESTOR ) 10 MG tablet Take 1 tablet (10 mg total) by mouth daily.   aspirin EC 81 MG tablet Take 81 mg by mouth 2 (two) times daily. (Patient not taking: Reported on 06/23/2024)   docusate sodium (COLACE) 100 MG capsule Take 1 capsule by mouth 2 (two) times daily as needed. (Patient not taking: Reported on 06/23/2024)   oxycodone  (OXY-IR) 5 MG capsule Take 5 mg by mouth every 4 (four) hours as needed. (Patient not taking: Reported on 06/23/2024)   No facility-administered encounter medications on file as of 06/23/2024.    Allergies (verified) Corn-containing products   History: Past Medical History:  Diagnosis Date   Allergy    Arthritis  Cataract    Colon polyp    Colon polyps 2015   Cough    chronic sinus issues, GERD with poor dietary control and Pulmonary Sarcoid.    GERD (gastroesophageal reflux disease) 09/2010   Dr Debrah. Bx proven.  Deve abd pain in Jan 2012 following high dose steroids for sarcoid. s/p EGD and improvement in symptoms following PPI   History of kidney stones    Hypogonadism male    Malignant neoplasm of prostate (HCC) 08/2009   Dr Gretel Ferrara. s/p surgery dec 2010, hx of rnormal PSA  since  then   Other and unspecified mycoses    Painful respiration    Peripheral neuropathy    Prostate cancer (HCC)    reoccurence (getting radiation 01-14-17 start).   Sarcoidosis 08/2010   stage 2 pulmonary, neck nodes, splenic granuloma   Skin cancer    Swelling, mass, or lump in head and neck    Past Surgical History:  Procedure Laterality Date   HERNIA REPAIR  13 yrs ago   HERNIA REPAIR     INGUINAL HERNIA REPAIR Right 06/11/2022   Procedure: OPEN RIGHT INGUINAL HERNIA REPAIR WITH MESH;  Surgeon: Vernetta Berg, MD;  Location: [facility];  Service: General;  Laterality: Right;  LMA   LIGAMENT REPAIR Right 03/04/2024   Procedure: REPAIR, LIGAMENT;  Surgeon: Barton Drape, MD;  Location: [facility];  Service: Orthopedics;  Laterality: Right;   LYMPH NODE BIOPSY  05/2010, 07/2010   nasal polyps  1984   NASAL SINUS SURGERY  2013   ORIF ANKLE FRACTURE Right 03/04/2024   Procedure: OPEN REDUCTION INTERNAL FIXATION (ORIF) ANKLE FRACTURE;  Surgeon: Barton Drape, MD;  Location: [facility];  Service: Orthopedics;  Laterality: Right;   PROSTATE SURGERY  2010   radiation 2018   SHOULDER SURGERY Right 2009, 2010   x 2   SKIN CANCER EXCISION  11/16/14   SYNDESMOSIS REPAIR Right 03/04/2024   Procedure: REPAIR, SYNDESMOSIS, ANKLE;  Surgeon: Barton Drape, MD;  Location: [facility];  Service: Orthopedics;  Laterality: Right;   TRIGGER FINGER RELEASE Left 06/26/2023   Procedure: RELEASE MIDDLE FINGER  TRIGGER FINGER/A-1 PULLEY;  Surgeon: Romona Harari, MD;  Location: [facility];  Service: Orthopedics;  Laterality: Left;  local 30   UMBILICAL HERNIA REPAIR N/A 06/11/2022   Procedure: HERNIA REPAIR UMBILICAL ADULT;  Surgeon: Vernetta Berg, MD;  Location:  [facility];  Service: General;  Laterality: N/A;   Family History  Problem Relation Age of Onset   Colon cancer Mother 72   Skin cancer  Mother    Cancer Mother        colon   Colon cancer Brother 58   Skin cancer Brother    Cancer Brother        colon   Colon cancer Maternal Aunt        dx in her 77s   Cancer Maternal Aunt        cancer of male organs dx in her 52s   Breast cancer Maternal Aunt 66       bilateral, dx again at 24   Colon cancer Maternal Aunt 35   Stomach cancer Maternal Aunt 77   Dementia Father    Stomach cancer Maternal Uncle 47   Skin cancer Paternal Aunt    Prostate cancer Paternal Uncle 20   Cancer Paternal Uncle        prostate   Stomach cancer Paternal Grandmother  dx in her 7s   Stroke Paternal Grandfather    Colon cancer Maternal Aunt 68   Prostate cancer Paternal Uncle    Cancer Paternal Uncle        prostate   Prostate cancer Paternal Uncle    Kidney cancer Paternal Uncle    Lung cancer Paternal Uncle    Cancer Paternal Uncle        prostate   Lung cancer Paternal Uncle    Cancer Paternal Uncle        unknown   Cancer Paternal Uncle        prostate   Cancer Cousin        paternal cousin with cancer NOS   Cancer Cousin        paternal cousin with cancer NOS   Esophageal cancer Neg Hx    Rectal cancer Neg Hx    Social History   Socioeconomic History   Marital status: Married    Spouse name: Not on file   Number of children: 2   Years of education: Not on file   Highest education level: Not on file  Occupational History   Occupation: unemployed  Tobacco Use   Smoking status: Never   Smokeless tobacco: Never  Vaping Use   Vaping status: Never Used  Substance and Sexual Activity   Alcohol use: No   Drug use: No   Sexual activity: Yes  Other Topics Concern   Not on file  Social History Narrative   Married   2 children   Social Drivers of Health   Financial Resource Strain: Low Risk  (06/23/2024)   Overall Financial Resource Strain (CARDIA)    Difficulty of Paying Living Expenses: Not hard at all  Food Insecurity: No Food Insecurity (06/23/2024)    Hunger Vital Sign    Worried About Running Out of Food in the Last Year: Never true    Ran Out of Food in the Last Year: Never true  Transportation Needs: No Transportation Needs (06/23/2024)   PRAPARE - Administrator, Civil Service (Medical): No    Lack of Transportation (Non-Medical): No  Physical Activity: Inactive (06/23/2024)   Exercise Vital Sign    Days of Exercise per Week: 0 days    Minutes of Exercise per Session: 0 min  Stress: No Stress Concern Present (06/23/2024)   [employer] of Occupational Health - Occupational Stress Questionnaire    Feeling of Stress: Not at all  Social Connections: Moderately Integrated (06/23/2024)   Social Connection and Isolation Panel    Frequency of Communication with Friends and Family: More than three times a week    Frequency of Social Gatherings with Friends and Family: More than three times a week    Attends Religious Services: 1 to 4 times per year    Active Member of [employer] or Organizations: No    Attends Banker Meetings: Never    Marital Status: Married    Tobacco Counseling Counseling given: Yes    Clinical Intake:  Pre-visit preparation completed: Yes  Pain : 0-10 (R-ankle broke) Pain Score: 10-Worst pain ever Pain Type: Other (Comment) (due broken ankle) Pain Orientation: Right Pain Descriptors / Indicators: Tender, Aching Pain Onset: Other (comment) Pain Frequency: Constant Pain Relieving Factors: pain meds  Pain Relieving Factors: pain meds  BMI - recorded: 27.12 Nutritional Status: BMI 25 -29 Overweight Nutritional Risks: None Diabetes: No  No results found for: HGBA1C   How often do you need to have someone  help you when you read instructions, pamphlets, or other written materials from your doctor or pharmacy?: 1 - Never  Interpreter Needed?: No  Information entered by :: alia t/cma   Activities of Daily Living     06/23/2024    8:51 AM 03/04/2024    9:19 AM  In your  present state of health, do you have any difficulty performing the following activities:  Hearing? 1 0  Vision? 0 0  Difficulty concentrating or making decisions? 0 0  Walking or climbing stairs? 1   Dressing or bathing? 0   Doing errands, shopping? 0   Preparing Food and eating ? N   Using the Toilet? N   In the past six months, have you accidently leaked urine? Y   Do you have problems with loss of bowel control? N   Managing your Medications? N   Managing your Finances? N   Housekeeping or managing your Housekeeping? N     Patient Care Team: Dettinger, Fonda LABOR, MD as PCP - General (Family Medicine) Roark Rush, MD as Consulting Physician (Otolaryngology) Renda Glance, MD as Consulting Physician (Urology) Fleeta Rothman, Jomarie SAILOR, MD as Consulting Physician (Infectious Diseases) Ivin Kocher, MD as Consulting Physician (Dermatology) Rosemarie Eather RAMAN, MD as Consulting Physician (Neurology)  I have updated your Care Teams any recent Medical Services you may have received from other providers in the past year.     Assessment:   This is a routine wellness examination for Philip Hernandez.  Hearing/Vision screen Hearing Screening - Comments:: Pt wear hearing aids Vision Screening - Comments:: Pt denies vision dif/use readers/pt goes to [facility] [address] in [place]/last ov 2024   Goals Addressed             This Visit's Progress    Prevent falls   On track      Depression Screen     06/23/2024    8:49 AM 06/19/2024   10:45 AM 12/18/2023   10:32 AM 06/19/2023   10:54 AM 05/09/2023    2:10 PM 12/17/2022   10:00 AM 12/12/2022   12:51 PM  PHQ 2/9 Scores  PHQ - 2 Score 0 0 0 0 0 0 0  PHQ- 9 Score 0 2  4 4       Fall Risk     06/23/2024    8:38 AM 06/19/2024   10:44 AM 12/18/2023   10:32 AM 06/19/2023   10:54 AM 05/09/2023    2:10 PM  Fall Risk   Falls in the past year? 0 0 0 0 0  Number falls in past yr: 0 0 0  0  Injury with Fall? 0 0 0  0  Risk for fall due to : No Fall Risks No  Fall Risks No Fall Risks  No Fall Risks  Follow up Falls evaluation completed Falls evaluation completed Falls evaluation completed  Falls evaluation completed    MEDICARE RISK AT HOME:  Medicare Risk at Home Any stairs in or around the home?: Yes If so, are there any without handrails?: Yes Home free of loose throw rugs in walkways, pet beds, electrical cords, etc?: Yes Adequate lighting in your home to reduce risk of falls?: Yes Life alert?: No Use of a cane, walker or w/c?: Yes Grab bars in the bathroom?: Yes Shower chair or bench in shower?: Yes Elevated toilet seat or a handicapped toilet?: No  TIMED UP AND GO:  Was the test performed?  no  Cognitive Function: 6CIT completed  06/23/2024    8:47 AM 12/12/2022   12:53 PM  6CIT Screen  What Year? 0 points 0 points  What month? 0 points 0 points  What time? 0 points 0 points  Count back from 20 0 points 0 points  Months in reverse 0 points 0 points  Repeat phrase 0 points 0 points  Total Score 0 points 0 points    Immunizations Immunization History  Administered Date(s) Administered   Fluad Quad(high Dose 65+) 06/16/2021, 06/15/2022   Fluad Trivalent(High Dose 65+) 06/19/2023   INFLUENZA, HIGH DOSE SEASONAL PF 06/19/2024   Influenza Whole 08/29/2010   Influenza, Quadrivalent, Recombinant, Inj, Pf 07/10/2019   Influenza,inj,Quad PF,6+ Mos 07/24/2013, 09/24/2014, 06/11/2015, 06/30/2016, 07/08/2017, 07/03/2018, 06/13/2020   Moderna Sars-Covid-2 Vaccination 10/10/2020   PFIZER(Purple Top)SARS-COV-2 Vaccination 02/17/2020, 03/09/2020   Tdap 12/12/2017    Screening Tests Health Maintenance  Topic Date Due   COVID-19 Vaccine (4 - 2025-26 season) 07/05/2024 (Originally 05/18/2024)   Zoster Vaccines- Shingrix (1 of 2) 09/19/2024 (Originally 05/02/1976)   Pneumococcal Vaccine: 50+ Years (1 of 1 - PCV) 06/19/2025 (Originally 05/03/2007)   Colonoscopy  06/19/2025 (Originally 06/04/2024)   Medicare Annual Wellness (AWV)   06/23/2025   DTaP/Tdap/Td (2 - Td or Tdap) 12/13/2027   Influenza Vaccine  Completed   Hepatitis C Screening  Completed   Meningococcal B Vaccine  Aged Out    Health Maintenance Items Addressed: See Nurse Notes at the end of this note  Additional Screening:  Vision Screening: Recommended annual ophthalmology exams for early detection of glaucoma and other disorders of the eye. Is the patient up to date with their annual eye exam?  Yes  Who is the provider or what is the name of the office in which the patient attends annual eye exams? [facility] in [facility]  Dental Screening: Recommended annual dental exams for proper oral hygiene  Community Resource Referral / Chronic Care Management: CRR required this visit?  No   CCM required this visit?  No   Plan:    I have personally reviewed and noted the following in the patient's chart:   Medical and social history Use of alcohol, tobacco or illicit drugs  Current medications and supplements including opioid prescriptions. Patient is  currently taking opioid prescriptions. Functional ability and status Nutritional status Physical activity Advanced directives List of other physicians Hospitalizations, surgeries, and ER visits in previous 12 months Vitals Screenings to include cognitive, depression, and falls Referrals and appointments  In addition, I have reviewed and discussed with patient certain preventive protocols, quality metrics, and best practice recommendations. A written personalized care plan for preventive services as well as general preventive health recommendations were provided to patient.   Philip Hernandez, CMA   06/23/2024   After Visit Summary: (MyChart) Due to this being a telephonic visit, the after visit summary with patients personalized plan was offered to patient via MyChart   Notes: Nothing significant to report at this time.

## 2024-06-29 NOTE — Discharge Instructions (Signed)
 Lillia Mountain, MD [facility]  Please read the following information regarding your care after surgery.  Medications  You only need a prescription for the narcotic pain medicine (ex. oxycodone , Percocet, Norco).  All of the other medicines listed below are available over the counter. ? acetaminophen  (Tylenol ) 500 mg every 4-6 hours as you need for minor to moderate pain  ? To help prevent blood clots, take aspirin (81 mg) twice daily for 28 days after surgery.  You should also get up every hour while you are awake to move around.  Weight Bearing ? OK to walk on the operative leg only AFTER the nerve block has completely worn off.  Cast / Splint / Dressing ? Keep your dressing clean and dry.  Don't put anything (coat hanger, pencil, etc) down inside of it.  If it gets wet, please notify the office immediately.  Swelling IMPORTANT: It is normal for you to have swelling where you had surgery. To reduce swelling and pain, keep at least 3 pillows under your leg so that your toes are above your nose and your heel is above the level of your hip.  It may be necessary to keep your foot or leg elevated for several weeks.  This is critical to helping your incisions heal and your pain to feel better.  Follow Up Call my office at [phone] when you are discharged from the hospital or surgery center to schedule an appointment to be seen within 7-10 days after surgery.  Call my office at [phone] if you develop a fever >101.5 F, nausea, vomiting, bleeding from the surgical site or severe pain.

## 2024-06-29 NOTE — H&P (Signed)
 ORTHOPAEDIC SURGERY H&P  Subjective:  The patient presents for right ankle removal of deep hardware (syndesmosis fixation), possible revision dynamic syndesmosis fixation.   Past Medical History:  Diagnosis Date   Allergy    Arthritis    Cataract    Colon polyp    Colon polyps 2015   Cough    chronic sinus issues, GERD with poor dietary control and Pulmonary Sarcoid.    GERD (gastroesophageal reflux disease) 09/2010   Dr Debrah. Bx proven.  Deve abd pain in Jan 2012 following high dose steroids for sarcoid. s/p EGD and improvement in symptoms following PPI   History of kidney stones    Hypogonadism male    Malignant neoplasm of prostate (HCC) 08/2009   Dr Gretel Ferrara. s/p surgery dec 2010, hx of rnormal PSA  since then   Other and unspecified mycoses    Painful respiration    Peripheral neuropathy    Prostate cancer (HCC)    reoccurence (getting radiation 01-14-17 start).   Sarcoidosis 08/2010   stage 2 pulmonary, neck nodes, splenic granuloma   Skin cancer    Swelling, mass, or lump in head and neck     Past Surgical History:  Procedure Laterality Date   HERNIA REPAIR  13 yrs ago   HERNIA REPAIR     INGUINAL HERNIA REPAIR Right 06/11/2022   Procedure: OPEN RIGHT INGUINAL HERNIA REPAIR WITH MESH;  Surgeon: Vernetta Berg, MD;  Location: [facility];  Service: General;  Laterality: Right;  LMA   LIGAMENT REPAIR Right 03/04/2024   Procedure: REPAIR, LIGAMENT;  Surgeon: Barton Drape, MD;  Location: [facility];  Service: Orthopedics;  Laterality: Right;   LYMPH NODE BIOPSY  05/2010, 07/2010   nasal polyps  1984   NASAL SINUS SURGERY  2013   ORIF ANKLE FRACTURE Right 03/04/2024   Procedure: OPEN REDUCTION INTERNAL FIXATION (ORIF) ANKLE FRACTURE;  Surgeon: Barton Drape, MD;  Location: [facility];  Service: Orthopedics;  Laterality: Right;   PROSTATE SURGERY  2010   radiation 2018   SHOULDER SURGERY Right 2009, 2010   x  2   SKIN CANCER EXCISION  11/16/14   SYNDESMOSIS REPAIR Right 03/04/2024   Procedure: REPAIR, SYNDESMOSIS, ANKLE;  Surgeon: Barton Drape, MD;  Location: [facility];  Service: Orthopedics;  Laterality: Right;   TRIGGER FINGER RELEASE Left 06/26/2023   Procedure: RELEASE MIDDLE FINGER  TRIGGER FINGER/A-1 PULLEY;  Surgeon: Romona Harari, MD;  Location: [facility];  Service: Orthopedics;  Laterality: Left;  local 30   UMBILICAL HERNIA REPAIR N/A 06/11/2022   Procedure: HERNIA REPAIR UMBILICAL ADULT;  Surgeon: Vernetta Berg, MD;  Location: [facility];  Service: General;  Laterality: N/A;     (Not in an outpatient encounter)    Allergies  Allergen Reactions   Corn-Containing Products Anaphylaxis    Social History   Socioeconomic History   Marital status: Married    Spouse name: Not on file   Number of children: 2   Years of education: Not on file   Highest education level: Not on file  Occupational History   Occupation: unemployed  Tobacco Use   Smoking status: Never   Smokeless tobacco: Never  Vaping Use   Vaping status: Never Used  Substance and Sexual Activity   Alcohol use: No   Drug use: No   Sexual activity: Yes  Other Topics Concern   Not on file  Social History Narrative   Married  2 children   Social Drivers of Corporate investment banker Strain: Low Risk  (06/23/2024)   Overall Financial Resource Strain (CARDIA)    Difficulty of Paying Living Expenses: Not hard at all  Food Insecurity: No Food Insecurity (06/23/2024)   Hunger Vital Sign    Worried About Running Out of Food in the Last Year: Never true    Ran Out of Food in the Last Year: Never true  Transportation Needs: No Transportation Needs (06/23/2024)   PRAPARE - Administrator, Civil Service (Medical): No    Lack of Transportation (Non-Medical): No  Physical Activity: Inactive (06/23/2024)   Exercise Vital Sign    Days of Exercise per  Week: 0 days    Minutes of Exercise per Session: 0 min  Stress: No Stress Concern Present (06/23/2024)   [employer] of Occupational Health - Occupational Stress Questionnaire    Feeling of Stress: Not at all  Social Connections: Moderately Integrated (06/23/2024)   Social Connection and Isolation Panel    Frequency of Communication with Friends and Family: More than three times a week    Frequency of Social Gatherings with Friends and Family: More than three times a week    Attends Religious Services: 1 to 4 times per year    Active Member of [employer] or Organizations: No    Attends Banker Meetings: Never    Marital Status: Married  Catering manager Violence: Not At Risk (06/23/2024)   Humiliation, Afraid, Rape, and Kick questionnaire    Fear of Current or Ex-Partner: No    Emotionally Abused: No    Physically Abused: No    Sexually Abused: No     History reviewed. No pertinent family history.   Review of Systems Pertinent items are noted in HPI.  Objective: Vital signs in last 24 hours:    06/23/2024    1:08 PM 06/23/2024    8:35 AM 06/19/2024   10:49 AM  Vitals with BMI  Height 5' 10 5' 10   Weight 185 lbs 189 lbs   BMI 26.54 27.12   Systolic  154 154  Diastolic  82 82  Pulse  52       EXAM: General: Well nourished, well developed. Awake, alert and oriented to time, place, person. Normal mood and affect. No apparent distress. Breathing room air.  Operative Lower Extremity: Alignment - Neutral Deformity - None Skin intact Tenderness to palpation - none 5/5 TA, PT, GS, Per, EHL, FHL Sensation intact to light touch throughout Palpable DP and PT pulses Special testing: None  The contralateral foot/ankle was examined for comparison and noted to be neurovascularly intact with no localized deformity, swelling, or tenderness.  Imaging Review All images taken were independently reviewed by me.  Assessment/Plan: The clinical and radiographic  findings were reviewed and discussed at length with the patient.  The patient presents for right ankle removal of deep hardware (syndesmosis fixation), possible revision dynamic syndesmosis fixation.  We spoke at length about the natural course of these findings. We discussed nonoperative and operative treatment options in detail.  The risks and benefits were presented and reviewed. The risks due to suture/hardware failure/irritation (or if removing hardware inability to remove part/all of hardware, recurrent instability), new/persistent/recurrent infection, stiffness, nerve/vessel/tendon injury, nonunion/malunion of any fracture, wound healing issues, allograft usage, development of arthritis, failure of this surgery, possibility of external fixation in certain situations, possibility of delayed definitive surgery, need for further surgery, prolonged wound care including further soft  tissue coverage procedures, thromboembolic events, anesthesia/medical complications/events perioperatively and beyond, amputation, death among others were discussed. The patient acknowledged the explanation and agreed to proceed with the plan.  Philip Hernandez  Orthopaedic Surgery [facility]

## 2024-07-01 ENCOUNTER — Encounter

## 2024-07-01 ENCOUNTER — Ambulatory Visit (HOSPITAL_BASED_OUTPATIENT_CLINIC_OR_DEPARTMENT_OTHER)

## 2024-07-01 ENCOUNTER — Ambulatory Visit (HOSPITAL_BASED_OUTPATIENT_CLINIC_OR_DEPARTMENT_OTHER)
Admission: RE | Admit: 2024-07-01 | Discharge: 2024-07-01 | Disposition: A | Discharge status: Home / Self Care | Attending: Orthopaedic Surgery | Admitting: Orthopaedic Surgery

## 2024-07-01 ENCOUNTER — Other Ambulatory Visit: Payer: Self-pay

## 2024-07-01 ENCOUNTER — Encounter (HOSPITAL_BASED_OUTPATIENT_CLINIC_OR_DEPARTMENT_OTHER)
Admission: RE | Disposition: A | Discharge status: Home / Self Care | Payer: Self-pay | Source: Home / Self Care | Attending: Orthopaedic Surgery

## 2024-07-01 ENCOUNTER — Ambulatory Visit (HOSPITAL_BASED_OUTPATIENT_CLINIC_OR_DEPARTMENT_OTHER): Payer: Self-pay | Admitting: Anesthesiology

## 2024-07-01 ENCOUNTER — Encounter (HOSPITAL_BASED_OUTPATIENT_CLINIC_OR_DEPARTMENT_OTHER): Payer: Self-pay | Admitting: Orthopaedic Surgery

## 2024-07-01 DIAGNOSIS — Z472 Encounter for removal of internal fixation device: Secondary | ICD-10-CM

## 2024-07-01 DIAGNOSIS — Z01818 Encounter for other preprocedural examination: Secondary | ICD-10-CM

## 2024-07-01 DIAGNOSIS — I10 Essential (primary) hypertension: Secondary | ICD-10-CM | POA: Diagnosis not present

## 2024-07-01 DIAGNOSIS — Z4589 Encounter for adjustment and management of other implanted devices: Secondary | ICD-10-CM | POA: Insufficient documentation

## 2024-07-01 DIAGNOSIS — T84213A Breakdown (mechanical) of internal fixation device of bones of foot and toes, initial encounter: Secondary | ICD-10-CM | POA: Diagnosis not present

## 2024-07-01 DIAGNOSIS — T8489XA Other specified complication of internal orthopedic prosthetic devices, implants and grafts, initial encounter: Secondary | ICD-10-CM | POA: Diagnosis not present

## 2024-07-01 HISTORY — PX: HARDWARE REMOVAL: SHX979

## 2024-07-01 SURGERY — REMOVAL, HARDWARE
Anesthesia: General | Site: Ankle | Laterality: Right

## 2024-07-01 MED ORDER — KETOROLAC TROMETHAMINE 30 MG/ML IJ SOLN
INTRAMUSCULAR | Status: DC | PRN
Start: 2024-07-01 — End: 2024-07-01
  Administered 2024-07-01: 30 mg via INTRAVENOUS

## 2024-07-01 MED ORDER — EPHEDRINE SULFATE-NACL 50-0.9 MG/10ML-% IV SOSY
PREFILLED_SYRINGE | INTRAVENOUS | Status: DC | PRN
Start: 2024-07-01 — End: 2024-07-01
  Administered 2024-07-01: 5 mg via INTRAVENOUS

## 2024-07-01 MED ORDER — PROPOFOL 10 MG/ML IV BOLUS
INTRAVENOUS | Status: DC | PRN
  Administered 2024-07-01: 170 mg via INTRAVENOUS

## 2024-07-01 MED ORDER — PROPOFOL 10 MG/ML IV BOLUS
INTRAVENOUS | Status: AC
  Filled 2024-07-01: qty 20

## 2024-07-01 MED ORDER — MIDAZOLAM HCL 2 MG/2ML IJ SOLN
INTRAMUSCULAR | Status: AC
  Filled 2024-07-01: qty 2

## 2024-07-01 MED ORDER — ACETAMINOPHEN 10 MG/ML IV SOLN
INTRAVENOUS | Status: DC | PRN
  Administered 2024-07-01: 1000 mg via INTRAVENOUS

## 2024-07-01 MED ORDER — FENTANYL CITRATE (PF) 100 MCG/2ML IJ SOLN: 25.0000 ug | INTRAMUSCULAR | Status: DC | PRN

## 2024-07-01 MED ORDER — ONDANSETRON HCL 4 MG/2ML IJ SOLN
INTRAMUSCULAR | Status: DC | PRN
  Administered 2024-07-01: 4 mg via INTRAVENOUS

## 2024-07-01 MED ORDER — FENTANYL CITRATE (PF) 100 MCG/2ML IJ SOLN
INTRAMUSCULAR | Status: DC | PRN
  Administered 2024-07-01: 50 ug via INTRAVENOUS

## 2024-07-01 MED ORDER — LIDOCAINE 2% (20 MG/ML) 5 ML SYRINGE
INTRAMUSCULAR | Status: AC
  Filled 2024-07-01: qty 5

## 2024-07-01 MED ORDER — LACTATED RINGERS IV SOLN: INTRAVENOUS | Status: DC

## 2024-07-01 MED ORDER — BUPIVACAINE HCL (PF) 0.5 % IJ SOLN
INTRAMUSCULAR | Status: DC | PRN
  Administered 2024-07-01: 10 mL

## 2024-07-01 MED ORDER — MIDAZOLAM HCL 5 MG/5ML IJ SOLN
INTRAMUSCULAR | Status: DC | PRN
  Administered 2024-07-01: 2 mg via INTRAVENOUS

## 2024-07-01 MED ORDER — CEFAZOLIN SODIUM-DEXTROSE 2-4 GM/100ML-% IV SOLN
INTRAVENOUS | Status: AC
  Filled 2024-07-01: qty 100

## 2024-07-01 MED ORDER — LIDOCAINE 2% (20 MG/ML) 5 ML SYRINGE
INTRAMUSCULAR | Status: AC
Start: 2024-07-01 — End: 2024-07-01
  Filled 2024-07-01: qty 5

## 2024-07-01 MED ORDER — CEFAZOLIN SODIUM-DEXTROSE 2-4 GM/100ML-% IV SOLN
2.0000 g | INTRAVENOUS | Status: AC
Start: 2024-07-01 — End: 2024-07-01
  Administered 2024-07-01: 2 g via INTRAVENOUS

## 2024-07-01 MED ORDER — FENTANYL CITRATE (PF) 100 MCG/2ML IJ SOLN
INTRAMUSCULAR | Status: AC
  Filled 2024-07-01: qty 2

## 2024-07-01 MED ORDER — ONDANSETRON HCL 4 MG/2ML IJ SOLN: 4.0000 mg | Freq: Once | INTRAMUSCULAR | Status: DC | PRN

## 2024-07-01 MED ORDER — LACTATED RINGERS IV SOLN: INTRAVENOUS | Status: DC | PRN

## 2024-07-01 MED ORDER — KETOROLAC TROMETHAMINE 30 MG/ML IJ SOLN
INTRAMUSCULAR | Status: AC
  Filled 2024-07-01: qty 1

## 2024-07-01 MED ORDER — CHLORHEXIDINE GLUCONATE 4 % EX SOLN: 60.0000 mL | Freq: Once | CUTANEOUS | Status: DC

## 2024-07-01 MED ORDER — ACETAMINOPHEN 10 MG/ML IV SOLN
INTRAVENOUS | Status: AC
Start: 2024-07-01 — End: 2024-07-01
  Filled 2024-07-01: qty 100

## 2024-07-01 MED ORDER — DEXAMETHASONE SOD PHOSPHATE PF 10 MG/ML IJ SOLN
INTRAMUSCULAR | Status: DC | PRN
  Administered 2024-07-01: 5 mg via INTRAVENOUS

## 2024-07-01 MED ORDER — LIDOCAINE 2% (20 MG/ML) 5 ML SYRINGE
INTRAMUSCULAR | Status: DC | PRN
  Administered 2024-07-01: 60 mg via INTRAVENOUS

## 2024-07-01 MED ORDER — 0.9 % SODIUM CHLORIDE (POUR BTL) OPTIME
TOPICAL | Status: DC | PRN
  Administered 2024-07-01: 1000 mL

## 2024-07-01 MED ORDER — ONDANSETRON HCL 4 MG/2ML IJ SOLN
INTRAMUSCULAR | Status: AC
  Filled 2024-07-01: qty 2

## 2024-07-01 SURGICAL SUPPLY — 42 items
BLADE SURG 15 STRL LF DISP TIS (BLADE) ×12 IMPLANT
BNDG ELASTIC 4INX 5YD STR LF (GAUZE/BANDAGES/DRESSINGS) ×4 IMPLANT
BNDG ELASTIC 6INX 5YD STR LF (GAUZE/BANDAGES/DRESSINGS) IMPLANT
BNDG GAUZE DERMACEA FLUFF 4 (GAUZE/BANDAGES/DRESSINGS) ×3 IMPLANT
CANISTER SUCT 1200ML W/VALVE (MISCELLANEOUS) ×3 IMPLANT
CHLORAPREP W/TINT 26 (MISCELLANEOUS) ×3 IMPLANT
COVER BACK TABLE 60X90IN (DRAPES) ×3 IMPLANT
CUFF TRNQT CYL 34X4.125X (TOURNIQUET CUFF) IMPLANT
DRAPE C-ARM 42X72 X-RAY (DRAPES) IMPLANT
DRAPE C-ARMOR (DRAPES) IMPLANT
DRAPE EXTREMITY T 121X128X90 (DISPOSABLE) ×3 IMPLANT
DRAPE IMP U-DRAPE 54X76 (DRAPES) ×3 IMPLANT
DRAPE OEC MINIVIEW 54X84 (DRAPES) IMPLANT
DRAPE U-SHAPE 47X51 STRL (DRAPES) ×3 IMPLANT
DRSG MEPITEL 4X7.2 (GAUZE/BANDAGES/DRESSINGS) ×3 IMPLANT
ELECTRODE REM PT RTRN 9FT ADLT (ELECTROSURGICAL) ×3 IMPLANT
GAUZE SPONGE 4X4 12PLY STRL (GAUZE/BANDAGES/DRESSINGS) ×3 IMPLANT
GLOVE BIOGEL PI IND STRL 8 (GLOVE) ×3 IMPLANT
GLOVE SURG SS PI 7.5 STRL IVOR (GLOVE) ×3 IMPLANT
GOWN STRL REUS W/ TWL LRG LVL3 (GOWN DISPOSABLE) ×6 IMPLANT
NDL HYPO 25X1 1.5 SAFETY (NEEDLE) IMPLANT
NEEDLE HYPO 25X1 1.5 SAFETY (NEEDLE) IMPLANT
NS IRRIG 1000ML POUR BTL (IV SOLUTION) ×3 IMPLANT
PACK BASIN DAY SURGERY FS (CUSTOM PROCEDURE TRAY) ×3 IMPLANT
PADDING CAST SYNTHETIC 4X4 STR (CAST SUPPLIES) IMPLANT
PENCIL SMOKE EVACUATOR (MISCELLANEOUS) ×1 IMPLANT
SHEET MEDIUM DRAPE 40X70 STRL (DRAPES) ×3 IMPLANT
SLEEVE SCD COMPRESS KNEE MED (STOCKING) ×3 IMPLANT
SPIKE FLUID TRANSFER (MISCELLANEOUS) IMPLANT
SPONGE T-LAP 18X18 ~~LOC~~+RFID (SPONGE) ×3 IMPLANT
STAPLER SKIN PROX WIDE 3.9 (STAPLE) ×1 IMPLANT
SUCTION TUBE FRAZIER 10FR DISP (SUCTIONS) IMPLANT
SUT ETHILON 2 0 FS 18 (SUTURE) ×5 IMPLANT
SUT MNCRL AB 3-0 PS2 18 (SUTURE) IMPLANT
SUT VIC AB 0 CT1 27XBRD ANBCTR (SUTURE) IMPLANT
SUT VIC AB 2-0 CT1 TAPERPNT 27 (SUTURE) ×3 IMPLANT
SUT VIC AB 3-0 SH 27X BRD (SUTURE) IMPLANT
SYR BULB IRRIG 60ML STRL (SYRINGE) ×3 IMPLANT
SYR CONTROL 10ML LL (SYRINGE) IMPLANT
TOWEL GREEN STERILE FF (TOWEL DISPOSABLE) ×6 IMPLANT
TUBE CONNECTING 20X1/4 (TUBING) IMPLANT
UNDERPAD 30X36 HEAVY ABSORB (UNDERPADS AND DIAPERS) ×3 IMPLANT

## 2024-07-01 NOTE — Anesthesia Preprocedure Evaluation (Addendum)
 Anesthesia Evaluation  Patient identified by MRN, date of birth, ID band Patient awake    Reviewed: Allergy & Precautions, NPO status , Patient's Chart, lab work & pertinent test results  History of Anesthesia Complications Negative for: history of anesthetic complications  Airway Mallampati: II  TM Distance: >3 FB Neck ROM: Full    Dental  (+) Dental Advisory Given, Teeth Intact   Pulmonary   Sarcoidosis    Pulmonary exam normal        Cardiovascular hypertension, Normal cardiovascular exam     Neuro/Psych  Neuromuscular disease  negative psych ROS   GI/Hepatic Neg liver ROS,GERD  Medicated and Controlled,,  Endo/Other  negative endocrine ROS    Renal/GU negative Renal ROS    Prostate cancer     Musculoskeletal  (+) Arthritis ,    Abdominal   Peds  Hematology negative hematology ROS (+)   Anesthesia Other Findings   Reproductive/Obstetrics                              Anesthesia Physical Anesthesia Plan  ASA: 2  Anesthesia Plan: General   Post-op Pain Management: Ofirmev  IV (intra-op)*   Induction: Intravenous  PONV Risk Score and Plan: 2 and Treatment may vary due to age or medical condition, Ondansetron  and Dexamethasone   Airway Management Planned: LMA  Additional Equipment: None  Intra-op Plan:   Post-operative Plan: Extubation in OR  Informed Consent: I have reviewed the patients History and Physical, chart, labs and discussed the procedure including the risks, benefits and alternatives for the proposed anesthesia with the patient or authorized representative who has indicated his/her understanding and acceptance.     Dental advisory given  Plan Discussed with: CRNA and Anesthesiologist  Anesthesia Plan Comments:          Anesthesia Quick Evaluation

## 2024-07-01 NOTE — Anesthesia Procedure Notes (Signed)
 Procedure Name: LMA Insertion Date/Time: 07/01/2024 12:21 PM  Performed by: Claudene Delon SQUIBB, CRNAPre-anesthesia Checklist: Patient identified, Emergency Drugs available, Suction available and Patient being monitored Patient Re-evaluated:Patient Re-evaluated prior to induction Oxygen Delivery Method: Circle System Utilized Preoxygenation: Pre-oxygenation with 100% oxygen Induction Type: IV induction Ventilation: Mask ventilation without difficulty LMA: LMA inserted LMA Size: 5.0 Number of attempts: 1 Airway Equipment and Method: Bite block Placement Confirmation: positive ETCO2 Tube secured with: Tape Dental Injury: Teeth and Oropharynx as per pre-operative assessment

## 2024-07-01 NOTE — Op Note (Signed)
 07/01/2024  12:41 PM   PATIENT: Philip Hernandez  68 y.o. male  MRN: [id number]   PRE-OPERATIVE DIAGNOSIS:   Right ankle symptomatic orthopaedic hardware   POST-OPERATIVE DIAGNOSIS:   Same   PROCEDURE: Right ankle removal of deep hardware (partial syndesmosis fixation)   SURGEON:  Lillia Mountain, MD   ASSISTANT: None   ANESTHESIA: General, regional   EBL: Minimal   TOURNIQUET:    Total Tourniquet Time Documented: Thigh (Right) - 6 minutes Total: Thigh (Right) - 6 minutes    COMPLICATIONS: None apparent   DISPOSITION: Extubated, awake and stable to recovery.   INDICATION FOR PROCEDURE: The patient presented with above diagnosis.  We discussed the diagnosis, alternative treatment options, risks and benefits of the above surgical intervention, as well as alternative non-operative treatments. All questions/concerns were addressed and the patient/family demonstrated appropriate understanding of the diagnosis, the procedure, the postoperative course, and overall prognosis. The patient wished to proceed with surgical intervention and signed an informed surgical consent as such, in each others presence prior to surgery.   PROCEDURE IN DETAIL: After preoperative consent was obtained and the correct operative site was identified, the patient was brought to the operating room supine on stretcher. General anesthesia was induced. Preoperative antibiotics were administered. Surgical timeout was taken. The patient was then positioned supine with an ipsilateral hip bump. The operative lower extremity was prepped and draped in standard sterile fashion with a tourniquet around the thigh. The extremity was exsanguinated and the tourniquet was inflated to 275 mmHg.  Prior lateralapproach was utilized over the distal fibula and dissection carried down to the level of plate. The syndesmosis screw was removed in part due to broken shaft.   A stress radiograph showed complete  stability of the ankle mortise to testing.   The surgical sites were thoroughly irrigated. The tourniquet was deflated and hemostasis achieved. Betadine  solution was used to irrigate and vancomycin  powder applied. The skin was closed without tension.    The leg was cleaned with saline and sterile mepitel dressings with gauze were applied. A well padded sterile wrap was applied. The patient was awakened from anesthesia and transported to the recovery room in stable condition.    FOLLOW UP PLAN: -transfer to PACU, then home -strict NWB operative extremity until nerve block wears off, then OK to WBAT, maximum elevation -maintain dressings until follow up -DVT ppx: Aspirin 81 mg twice daily while NWB -follow up as outpatient within 7-10 days for wound check -sutures out in 2-3 weeks in outpatient office   RADIOGRAPHS: AP, lateral, oblique and stress radiographs of the operative ankle were obtained intraoperatively. These showed interval partial removal of the syndesmosis rigid fixation. Manual stress radiographs were taken and the joints were noted to be stable following hardware removal. No other acute injuries are noted.   Lillia Mountain [facility]

## 2024-07-01 NOTE — Transfer of Care (Signed)
 Immediate Anesthesia Transfer of Care Note  Patient: Philip Hernandez  Procedure(s) Performed: REMOVAL HARDWARE RIGHT ANKLE (Right: Ankle)  Patient Location: PACU  Anesthesia Type:General  Level of Consciousness: drowsy  Airway & Oxygen Therapy: Patient Spontanous Breathing and Patient connected to face mask oxygen  Post-op Assessment: Report given to RN and Post -op Vital signs reviewed and stable  Post vital signs: Reviewed and stable  Last Vitals:  Vitals Value Taken Time  BP 121/64 (81)   Temp    Pulse 57 07/01/24 12:46  Resp 13 07/01/24 12:46  SpO2 99 % 07/01/24 12:46  Vitals shown include unfiled device data.  Last Pain:  Vitals:   07/01/24 1040  TempSrc: Temporal  PainSc: 0-No pain         Complications: No notable events documented.

## 2024-07-01 NOTE — H&P (Signed)
 H&P Update:  -History and Physical Reviewed  -Patient has been re-examined  -No change in the plan of care  -The risks and benefits were presented and reviewed. The risks due to inability to remove part/all of hardware, recurrent instability, hardware/suture failure and/or irritation, new/persistent infection, stiffness, nerve/vessel/tendon injury or rerupture of repaired tendon, nonunion/malunion, allograft usage, wound healing issues, development of arthritis, failure of this surgery, possibility of external fixation with delayed definitive surgery, need for further surgery, thromboembolic events, anesthesia/medical complications, amputation, death among others were discussed. The patient acknowledged the explanation, agreed to proceed with the plan and a consent was signed.  Philip Hernandez

## 2024-07-01 NOTE — Anesthesia Postprocedure Evaluation (Signed)
 Anesthesia Post Note  Patient: Philip Hernandez  Procedure(s) Performed: REMOVAL HARDWARE RIGHT ANKLE (Right: Ankle)     Patient location during evaluation: PACU Anesthesia Type: General Level of consciousness: awake and alert Pain management: pain level controlled Vital Signs Assessment: post-procedure vital signs reviewed and stable Respiratory status: spontaneous breathing, nonlabored ventilation and respiratory function stable Cardiovascular status: stable and blood pressure returned to baseline Anesthetic complications: no   No notable events documented.  Last Vitals:  Vitals:   07/01/24 1300 07/01/24 1315  BP: 130/66 (!) 140/74  Pulse: (!) 54 (!) 53  Resp: 14 14  Temp:    SpO2: 98% 95%    Last Pain:  Vitals:   07/01/24 1315  TempSrc:   PainSc: 0-No pain                 Debby FORBES Like

## 2024-07-02 ENCOUNTER — Encounter (HOSPITAL_BASED_OUTPATIENT_CLINIC_OR_DEPARTMENT_OTHER): Payer: Self-pay | Admitting: Orthopaedic Surgery

## 2024-07-15 ENCOUNTER — Encounter: Admitting: Gastroenterology

## 2024-07-21 DIAGNOSIS — M6281 Muscle weakness (generalized): Secondary | ICD-10-CM | POA: Diagnosis not present

## 2024-07-21 DIAGNOSIS — M25671 Stiffness of right ankle, not elsewhere classified: Secondary | ICD-10-CM | POA: Diagnosis not present

## 2024-07-21 DIAGNOSIS — M25571 Pain in right ankle and joints of right foot: Secondary | ICD-10-CM | POA: Diagnosis not present

## 2024-07-23 DIAGNOSIS — S82841D Displaced bimalleolar fracture of right lower leg, subsequent encounter for closed fracture with routine healing: Secondary | ICD-10-CM | POA: Diagnosis not present

## 2024-07-31 DIAGNOSIS — M25671 Stiffness of right ankle, not elsewhere classified: Secondary | ICD-10-CM | POA: Diagnosis not present

## 2024-07-31 DIAGNOSIS — M25571 Pain in right ankle and joints of right foot: Secondary | ICD-10-CM | POA: Diagnosis not present

## 2024-07-31 DIAGNOSIS — M6281 Muscle weakness (generalized): Secondary | ICD-10-CM | POA: Diagnosis not present

## 2024-08-04 DIAGNOSIS — M25571 Pain in right ankle and joints of right foot: Secondary | ICD-10-CM | POA: Diagnosis not present

## 2024-08-04 DIAGNOSIS — M25671 Stiffness of right ankle, not elsewhere classified: Secondary | ICD-10-CM | POA: Diagnosis not present

## 2024-08-04 DIAGNOSIS — M6281 Muscle weakness (generalized): Secondary | ICD-10-CM | POA: Diagnosis not present

## 2024-08-07 DIAGNOSIS — C61 Malignant neoplasm of prostate: Secondary | ICD-10-CM | POA: Diagnosis not present

## 2024-08-11 DIAGNOSIS — M6281 Muscle weakness (generalized): Secondary | ICD-10-CM | POA: Diagnosis not present

## 2024-08-11 DIAGNOSIS — M25671 Stiffness of right ankle, not elsewhere classified: Secondary | ICD-10-CM | POA: Diagnosis not present

## 2024-08-11 DIAGNOSIS — M25571 Pain in right ankle and joints of right foot: Secondary | ICD-10-CM | POA: Diagnosis not present

## 2024-08-18 ENCOUNTER — Ambulatory Visit

## 2024-08-18 VITALS — Ht 70.0 in | Wt 187.6 lb

## 2024-08-18 DIAGNOSIS — Z800 Family history of malignant neoplasm of digestive organs: Secondary | ICD-10-CM

## 2024-08-18 DIAGNOSIS — K219 Gastro-esophageal reflux disease without esophagitis: Secondary | ICD-10-CM

## 2024-08-18 DIAGNOSIS — Z860100 Personal history of colon polyps, unspecified: Secondary | ICD-10-CM

## 2024-08-18 NOTE — Progress Notes (Signed)
 PCP MD at time of PV: Fonda Levins, MD  __________________________________________________________________________________________________________________________________________  No egg allergy known to patient  No soy allergy known to patient No issues known to pt with past sedation with any surgeries or procedures Patient denies ever being told they had issues or difficulty with intubation  No FH of Malignant Hyperthermia Pt is not on diet pills Pt is not on  home 02  Pt is not on blood thinners  No A fib or A flutter Have any cardiac testing pending--no  LOA: independent No Chew or Snuff tobacco __________________________________________________________________________________________________________________________________________  Constipation:  no  Prep: suprep  __________________________________________________________________________________________________________________________________________  PV completed with patient. Prep instructions reviewed and provided during apt. Rx sent to preferred pharmacy.  __________________________________________________________________________________________________________________________________________  Patient's chart reviewed by Norleen Schillings CNRA prior to previsit and patient appropriate for the [facility].  Previsit completed and red dot placed by patient's name on their procedure day (on provider's schedule).

## 2024-08-21 DIAGNOSIS — M6281 Muscle weakness (generalized): Secondary | ICD-10-CM | POA: Diagnosis not present

## 2024-08-21 DIAGNOSIS — M25671 Stiffness of right ankle, not elsewhere classified: Secondary | ICD-10-CM | POA: Diagnosis not present

## 2024-08-21 DIAGNOSIS — M25571 Pain in right ankle and joints of right foot: Secondary | ICD-10-CM | POA: Diagnosis not present

## 2024-08-27 ENCOUNTER — Encounter: Payer: Self-pay | Admitting: Gastroenterology

## 2024-09-01 ENCOUNTER — Encounter: Admitting: Gastroenterology

## 2024-09-04 ENCOUNTER — Ambulatory Visit: Admitting: Gastroenterology

## 2024-09-04 ENCOUNTER — Encounter: Payer: Self-pay | Admitting: Gastroenterology

## 2024-09-04 VITALS — BP 142/73 | HR 57 | Temp 97.6°F | Resp 17 | Ht 70.0 in | Wt 187.0 lb

## 2024-09-04 DIAGNOSIS — D122 Benign neoplasm of ascending colon: Secondary | ICD-10-CM | POA: Diagnosis not present

## 2024-09-04 DIAGNOSIS — K648 Other hemorrhoids: Secondary | ICD-10-CM | POA: Diagnosis not present

## 2024-09-04 DIAGNOSIS — K209 Esophagitis, unspecified without bleeding: Secondary | ICD-10-CM

## 2024-09-04 DIAGNOSIS — K5520 Angiodysplasia of colon without hemorrhage: Secondary | ICD-10-CM

## 2024-09-04 DIAGNOSIS — Z1211 Encounter for screening for malignant neoplasm of colon: Secondary | ICD-10-CM

## 2024-09-04 DIAGNOSIS — K449 Diaphragmatic hernia without obstruction or gangrene: Secondary | ICD-10-CM | POA: Diagnosis not present

## 2024-09-04 DIAGNOSIS — Z800 Family history of malignant neoplasm of digestive organs: Secondary | ICD-10-CM

## 2024-09-04 DIAGNOSIS — D123 Benign neoplasm of transverse colon: Secondary | ICD-10-CM

## 2024-09-04 DIAGNOSIS — Q399 Congenital malformation of esophagus, unspecified: Secondary | ICD-10-CM | POA: Diagnosis not present

## 2024-09-04 DIAGNOSIS — K219 Gastro-esophageal reflux disease without esophagitis: Secondary | ICD-10-CM

## 2024-09-04 DIAGNOSIS — K2270 Barrett's esophagus without dysplasia: Secondary | ICD-10-CM

## 2024-09-04 DIAGNOSIS — Z860100 Personal history of colon polyps, unspecified: Secondary | ICD-10-CM

## 2024-09-04 DIAGNOSIS — K208 Other esophagitis without bleeding: Secondary | ICD-10-CM | POA: Diagnosis not present

## 2024-09-04 MED ORDER — SODIUM CHLORIDE 0.9 % IV SOLN: 500.0000 mL | INTRAVENOUS | Status: DC

## 2024-09-04 NOTE — Progress Notes (Signed)
 Transferred to PACU via stretcher.  Not responding to stimulation at this time.  VSS upon leaving procedure room.

## 2024-09-04 NOTE — Progress Notes (Signed)
 Called to room to assist during endoscopic procedure.  Patient ID and intended procedure confirmed with present staff. Received instructions for my participation in the procedure from the performing physician.

## 2024-09-04 NOTE — Progress Notes (Signed)
 [facility] History and Physical   Primary Care Physician:  Dettinger, Fonda LABOR, MD   Reason for Procedure:   Family history of colon cancer, history of polyps, GERD - suspected BE 05/2019  Plan:    EGD and colonoscopy     HPI: Philip Hernandez is a 68 y.o. male  here for EGD and colonoscopy. Last EGD 5 years ago - suspected 1 cm segment of BE but biopsies negative. Takes omeprazole  for GERD. Strong FH of CRC, negative genetic testing, last colonoscopy 5 years ago.    Patient denies any bowel symptoms at this time. Otherwise feels well without any cardiopulmonary symptoms.   I have discussed risks / benefits of anesthesia and endoscopic procedure with Lamar Sharps and they wish to proceed with the exams as outlined today.   The patient was provided an opportunity to ask questions and all were answered. The patient agreed with the plan.    Past Medical History:  Diagnosis Date   Allergy    Arthritis    Cataract    Colon polyp    Colon polyps 2015   Cough    chronic sinus issues, GERD with poor dietary control and Pulmonary Sarcoid.    GERD (gastroesophageal reflux disease) 09/2010   Dr Debrah. Bx proven.  Deve abd pain in Jan 2012 following high dose steroids for sarcoid. s/p EGD and improvement in symptoms following PPI   History of kidney stones    Hypogonadism male    Malignant neoplasm of prostate (HCC) 08/2009   Dr Gretel Ferrara. s/p surgery dec 2010, hx of rnormal PSA  since then   Other and unspecified mycoses    Painful respiration    Peripheral neuropathy    Prostate cancer (HCC)    reoccurence (getting radiation 01-14-17 start).   Sarcoidosis 08/2010   stage 2 pulmonary, neck nodes, splenic granuloma   Skin cancer    Swelling, mass, or lump in head and neck     Past Surgical History:  Procedure Laterality Date   HARDWARE REMOVAL Right 07/01/2024   Procedure: REMOVAL HARDWARE RIGHT ANKLE;  Surgeon: Barton Drape, MD;  Location: [facility];  Service: Orthopedics;  Laterality: Right;   HERNIA REPAIR  13 yrs ago   HERNIA REPAIR     INGUINAL HERNIA REPAIR Right 06/11/2022   Procedure: OPEN RIGHT INGUINAL HERNIA REPAIR WITH MESH;  Surgeon: Vernetta Berg, MD;  Location: [facility];  Service: General;  Laterality: Right;  LMA   LIGAMENT REPAIR Right 03/04/2024   Procedure: REPAIR, LIGAMENT;  Surgeon: Barton Drape, MD;  Location: [facility];  Service: Orthopedics;  Laterality: Right;   LYMPH NODE BIOPSY  05/2010, 07/2010   nasal polyps  1984   NASAL SINUS SURGERY  2013   ORIF ANKLE FRACTURE Right 03/04/2024   Procedure: OPEN REDUCTION INTERNAL FIXATION (ORIF) ANKLE FRACTURE;  Surgeon: Barton Drape, MD;  Location: [facility];  Service: Orthopedics;  Laterality: Right;   PROSTATE SURGERY  2010   radiation 2018   SHOULDER SURGERY Right 2009, 2010   x 2   SKIN CANCER EXCISION  11/16/14   SYNDESMOSIS REPAIR Right 03/04/2024   Procedure: REPAIR, SYNDESMOSIS, ANKLE;  Surgeon: Barton Drape, MD;  Location: [facility];  Service: Orthopedics;  Laterality: Right;   TRIGGER FINGER RELEASE Left 06/26/2023   Procedure: RELEASE MIDDLE FINGER  TRIGGER FINGER/A-1 PULLEY;  Surgeon: Romona Harari, MD;  Location: [facility];  Service: Orthopedics;  Laterality:  Left;  local 30   UMBILICAL HERNIA REPAIR N/A 06/11/2022   Procedure: HERNIA REPAIR UMBILICAL ADULT;  Surgeon: Vernetta Berg, MD;  Location: [facility];  Service: General;  Laterality: N/A;    Prior to Admission medications  Medication Sig Start Date End Date Taking? Authorizing Provider  cholecalciferol (VITAMIN D) 1000 UNITS tablet Take 1,000 Units by mouth daily.   Yes [provider]  gabapentin  (NEURONTIN ) 800 MG tablet Take 1 tablet (800 mg total) by mouth 4 (four) times daily. 06/16/24  Yes McCue, Harlene, NP  loratadine (CLARITIN) 10 MG tablet Take 10 mg  by mouth daily.   Yes [provider]  Multiple Vitamins-Minerals (AIRBORNE PO) Take by mouth daily.   Yes [provider]  omeprazole  (PRILOSEC) 40 MG capsule TAKE ONE CAPSULE BY MOUTH TWICE A DAY 12/18/23  Yes Dettinger, Fonda LABOR, MD  rosuvastatin  (CRESTOR ) 10 MG tablet Take 1 tablet (10 mg total) by mouth daily. 06/19/24  Yes Dettinger, Fonda LABOR, MD  acetaminophen  (TYLENOL ) 500 MG tablet Take 500 mg by mouth every 6 (six) hours as needed.    [provider]    Current Outpatient Medications  Medication Sig Dispense Refill   cholecalciferol (VITAMIN D) 1000 UNITS tablet Take 1,000 Units by mouth daily.     gabapentin  (NEURONTIN ) 800 MG tablet Take 1 tablet (800 mg total) by mouth 4 (four) times daily. 400 tablet 3   loratadine (CLARITIN) 10 MG tablet Take 10 mg by mouth daily.     Multiple Vitamins-Minerals (AIRBORNE PO) Take by mouth daily.     omeprazole  (PRILOSEC) 40 MG capsule TAKE ONE CAPSULE BY MOUTH TWICE A DAY 180 capsule 3   rosuvastatin  (CRESTOR ) 10 MG tablet Take 1 tablet (10 mg total) by mouth daily. 90 tablet 3   acetaminophen  (TYLENOL ) 500 MG tablet Take 500 mg by mouth every 6 (six) hours as needed.     Current Facility-Administered Medications  Medication Dose Route Frequency Provider Last Rate Last Admin   0.9 %  sodium chloride  infusion  500 mL Intravenous Continuous Hephzibah Strehle, Elspeth SQUIBB, MD        Allergies as of 09/04/2024 - Review Complete 09/04/2024  Allergen Reaction Noted   Corn-containing products Anaphylaxis 06/29/2015    Family History  Problem Relation Age of Onset   Colon cancer Mother 47   Skin cancer Mother    Cancer Mother        colon   Dementia Father    Stomach cancer Brother    Colon cancer Brother 25   Skin cancer Brother    Cancer Brother        colon   Colon cancer Maternal Aunt        dx in her 34s   Cancer Maternal Aunt        cancer of male organs dx in her 67s   Breast cancer Maternal Aunt 66        bilateral, dx again at 73   Colon cancer Maternal Aunt 35   Stomach cancer Maternal Aunt 68   Colon cancer Maternal Aunt 68   Stomach cancer Maternal Uncle 47   Skin cancer Paternal Aunt    Prostate cancer Paternal Uncle 26   Cancer Paternal Uncle        prostate   Prostate cancer Paternal Uncle    Cancer Paternal Uncle        prostate   Prostate cancer Paternal Uncle    Kidney cancer Paternal Uncle  Lung cancer Paternal Uncle    Cancer Paternal Uncle        prostate   Lung cancer Paternal Uncle    Cancer Paternal Uncle        unknown   Cancer Paternal Uncle        prostate   Stomach cancer Paternal Grandmother        dx in her 29s   Stroke Paternal Grandfather    Cancer Cousin        paternal cousin with cancer NOS   Cancer Cousin        paternal cousin with cancer NOS   Esophageal cancer Neg Hx    Rectal cancer Neg Hx     Social History   Socioeconomic History   Marital status: Married    Spouse name: Not on file   Number of children: 2   Years of education: Not on file   Highest education level: Not on file  Occupational History   Occupation: unemployed  Tobacco Use   Smoking status: Never   Smokeless tobacco: Never  Vaping Use   Vaping status: Never Used  Substance and Sexual Activity   Alcohol use: No   Drug use: No   Sexual activity: Yes  Other Topics Concern   Not on file  Social History Narrative   Married   2 children   Social Drivers of Health   Tobacco Use: Low Risk (09/04/2024)   Patient History    Smoking Tobacco Use: Never    Smokeless Tobacco Use: Never    Passive Exposure: Not on file  Financial Resource Strain: Low Risk (06/23/2024)   Overall Financial Resource Strain (CARDIA)    Difficulty of Paying Living Expenses: Not hard at all  Food Insecurity: No Food Insecurity (06/23/2024)   Epic    Worried About Programme Researcher, Broadcasting/film/video in the Last Year: Never true    Ran Out of Food in the Last Year: Never true  Transportation Needs: No  Transportation Needs (06/23/2024)   Epic    Lack of Transportation (Medical): No    Lack of Transportation (Non-Medical): No  Physical Activity: Inactive (06/23/2024)   Exercise Vital Sign    Days of Exercise per Week: 0 days    Minutes of Exercise per Session: 0 min  Stress: No Stress Concern Present (06/23/2024)   [employer] of Occupational Health - Occupational Stress Questionnaire    Feeling of Stress: Not at all  Social Connections: Moderately Integrated (06/23/2024)   Social Connection and Isolation Panel    Frequency of Communication with Friends and Family: More than three times a week    Frequency of Social Gatherings with Friends and Family: More than three times a week    Attends Religious Services: 1 to 4 times per year    Active Member of [employer] or Organizations: No    Attends Banker Meetings: Never    Marital Status: Married  Catering Manager Violence: Not At Risk (06/23/2024)   Epic    Fear of Current or Ex-Partner: No    Emotionally Abused: No    Physically Abused: No    Sexually Abused: No  Depression (PHQ2-9): Low Risk (06/23/2024)   Depression (PHQ2-9)    PHQ-2 Score: 0  Alcohol Screen: Low Risk (06/23/2024)   Alcohol Screen    Last Alcohol Screening Score (AUDIT): 0  Housing: Unknown (06/23/2024)   Epic    Unable to Pay for Housing in the Last Year: No    Number  of Times Moved in the Last Year: Not on file    Homeless in the Last Year: No  Utilities: Not At Risk (06/23/2024)   Epic    Threatened with loss of utilities: No  Health Literacy: Adequate Health Literacy (06/23/2024)   B1300 Health Literacy    Frequency of need for help with medical instructions: Never    Review of Systems: All other review of systems negative except as mentioned in the HPI.  Physical Exam: Vital signs BP 133/72   Pulse 62   Temp 97.6 F (36.4 C) (Temporal)   Ht 5' 10 (1.778 m)   Wt 187 lb (84.8 kg)   SpO2 100%   BMI 26.83 kg/m   General:   Alert,   Well-developed, pleasant and cooperative in NAD Lungs:  Clear throughout to auscultation.   Heart:  Regular rate and rhythm Abdomen:  Soft, nontender and nondistended.   Neuro/Psych:  Alert and cooperative. Normal mood and affect. A and O x 3  Marcey Naval, MD [facility]

## 2024-09-04 NOTE — Op Note (Signed)
 [facility] Patient Name: Philip Hernandez Procedure Date: 09/04/2024 1:44 PM MRN: [id number] Endoscopist: Elspeth P. Leigh , MD, [id number] Age: 68 Referring MD:  Date of Birth: 30-May-1956 Gender: Male Account #: [id number] Procedure:                Colonoscopy Indications:              High risk colon cancer surveillance: Personal                            history of colonic polyps - polyp removed 05/2019 -                            multiple first degree and second degree relatives                            with colon cancer, negative genetic testing Medicines:                Monitored Anesthesia Care Procedure:                Pre-Anesthesia Assessment:                           - Prior to the procedure, a History and Physical                            was performed, and patient medications and                            allergies were reviewed. The patient's tolerance of                            previous anesthesia was also reviewed. The risks                            and benefits of the procedure and the sedation                            options and risks were discussed with the patient.                            All questions were answered, and informed consent                            was obtained. Prior Anticoagulants: The patient has                            taken no anticoagulant or antiplatelet agents. ASA                            Grade Assessment: II - A patient with mild systemic                            disease. After reviewing the risks and benefits,  the patient was deemed in satisfactory condition to                            undergo the procedure.                           After obtaining informed consent, the colonoscope                            was passed under direct vision. Throughout the                            procedure, the patient's blood pressure, pulse, and                            oxygen  saturations were monitored continuously. The                            CF [id number] #[id number] was introduced through the anus                            and advanced to the the cecum, identified by                            appendiceal orifice and ileocecal valve. The                            colonoscopy was performed without difficulty. The                            patient tolerated the procedure well. The quality                            of the bowel preparation was good. The ileocecal                            valve, appendiceal orifice, and rectum were                            photographed. Scope In: 2:20:05 PM Scope Out: 2:35:43 PM Scope Withdrawal Time: 0 hours 10 minutes 37 seconds  Total Procedure Duration: 0 hours 15 minutes 38 seconds  Findings:                 The perianal and digital rectal examinations were                            normal.                           A single small angiodysplastic lesion was found in                            the cecum.  A 3 to 4 mm polyp was found in the ascending colon.                            The polyp was sessile. The polyp was removed with a                            cold snare. Resection and retrieval were complete.                           Three sessile polyps were found in the transverse                            colon. The polyps were 3 to 4 mm in size. These                            polyps were removed with a cold snare. Resection                            and retrieval were complete.                           Internal hemorrhoids were found during retroflexion.                           The exam was otherwise without abnormality. Complications:            No immediate complications. Estimated blood loss:                            Minimal. Estimated Blood Loss:     Estimated blood loss was minimal. Impression:               - A single colonic angiodysplastic lesion.                            - One 3 to 4 mm polyp in the ascending colon,                            removed with a cold snare. Resected and retrieved.                           - Three 3 to 4 mm polyps in the transverse colon,                            removed with a cold snare. Resected and retrieved.                           - Internal hemorrhoids.                           - The examination was otherwise normal. Recommendation:           - Patient has a contact number available for  emergencies. The signs and symptoms of potential                            delayed complications were discussed with the                            patient. Return to normal activities tomorrow.                            Written discharge instructions were provided to the                            patient.                           - Resume previous diet.                           - Continue present medications.                           - Await pathology results. Elspeth P. Tremain Rucinski, MD 09/04/2024 2:40:57 PM This report has been signed electronically.

## 2024-09-04 NOTE — Patient Instructions (Signed)
 -  Resume previous diet. - Continue present medications. - Await pathology results.  YOU HAD AN ENDOSCOPIC PROCEDURE TODAY AT THE [facility]:   Refer to the procedure report that was given to you for any specific questions about what was found during the examination.  If the procedure report does not answer your questions, please call your gastroenterologist to clarify.  If you requested that your care partner not be given the details of your procedure findings, then the procedure report has been included in a sealed envelope for you to review at your convenience later.  YOU SHOULD EXPECT: Some feelings of bloating in the abdomen. Passage of more gas than usual.  Walking can help get rid of the air that was put into your GI tract during the procedure and reduce the bloating. If you had a lower endoscopy (such as a colonoscopy or flexible sigmoidoscopy) you may notice spotting of blood in your stool or on the toilet paper. If you underwent a bowel prep for your procedure, you may not have a normal bowel movement for a few days.  Please Note:  You might notice some irritation and congestion in your nose or some drainage.  This is from the oxygen used during your procedure.  There is no need for concern and it should clear up in a day or so.  SYMPTOMS TO REPORT IMMEDIATELY:  Following lower endoscopy (colonoscopy or flexible sigmoidoscopy):  Excessive amounts of blood in the stool  Significant tenderness or worsening of abdominal pains  Swelling of the abdomen that is new, acute  Fever of 100F or higher  Following upper endoscopy (EGD)  Vomiting of blood or coffee ground material  New chest pain or pain under the shoulder blades  Painful or persistently difficult swallowing  New shortness of breath  Fever of 100F or higher  Black, tarry-looking stools  For urgent or emergent issues, a gastroenterologist can be reached at any hour by calling [phone]. Do not use MyChart  messaging for urgent concerns.    DIET:  We do recommend a small meal at first, but then you may proceed to your regular diet.  Drink plenty of fluids but you should avoid alcoholic beverages for 24 hours.  ACTIVITY:  You should plan to take it easy for the rest of today and you should NOT DRIVE or use heavy machinery until tomorrow (because of the sedation medicines used during the test).    FOLLOW UP: Our staff will call the number listed on your records the next business day following your procedure.  We will call around 7:15- 8:00 am to check on you and address any questions or concerns that you may have regarding the information given to you following your procedure. If we do not reach you, we will leave a message.     If any biopsies were taken you will be contacted by phone or by letter within the next 1-3 weeks.  Please call us at [phone] if you have not heard about the biopsies in 3 weeks.    SIGNATURES/CONFIDENTIALITY: You and/or your care partner have signed paperwork which will be entered into your electronic medical record.  These signatures attest to the fact that that the information above on your After Visit Summary has been reviewed and is understood.  Full responsibility of the confidentiality of this discharge information lies with you and/or your care-partner.

## 2024-09-04 NOTE — Op Note (Signed)
  [facility] Patient Name: Philip Hernandez Procedure Date: 09/04/2024 1:48 PM MRN: [id number] Endoscopist: Elspeth P. Leigh , MD, [id number] Age: 68 Referring MD:  Date of Birth: May 17, 1956 Gender: Male Account #: [id number] Procedure:                Upper GI endoscopy Indications:              Follow-up of gastro-esophageal reflux disease - on                            omeprazole , last exam 05/2019 suggestive of a 1cm                            segment of Barrett's but biopsies nondiagnostic Medicines:                Monitored Anesthesia Care Procedure:                Pre-Anesthesia Assessment:                           - Prior to the procedure, a History and Physical                            was performed, and patient medications and                            allergies were reviewed. The patient's tolerance of                            previous anesthesia was also reviewed. The risks                            and benefits of the procedure and the sedation                            options and risks were discussed with the patient.                            All questions were answered, and informed consent                            was obtained. Prior Anticoagulants: The patient has                            taken no anticoagulant or antiplatelet agents. ASA                            Grade Assessment: II - A patient with mild systemic                            disease. After reviewing the risks and benefits,                            the patient was deemed in satisfactory condition to  undergo the procedure.                           After obtaining informed consent, the endoscope was                            passed under direct vision. Throughout the                            procedure, the patient's blood pressure, pulse, and                            oxygen saturations were monitored continuously. The                             Olympus Scope [id number] was introduced through the                            mouth, and advanced to the second part of duodenum.                            The upper GI endoscopy was accomplished without                            difficulty. The patient tolerated the procedure                            well. Scope In: Scope Out: Findings:                 Esophagogastric landmarks were identified: the                            Z-line was found at 39 cm, the gastroesophageal                            junction was found at 40 cm and the upper extent of                            the gastric folds was found at 42 cm from the                            incisors.                           A 1 cm hiatal hernia was present.                           The Z-line was irregular and was found 39 cm from                            the incisors. In addition to the irregular z line                            there was  an [place] of salmon colored mucosa which                            extended about 1cm or so from the z line. Biopsies                            were taken with a cold forceps for histology, rule                            out Barrett's                           An area of ectopic gastric mucosa were found in the                            upper third of the esophagus.                           The exam of the esophagus was otherwise normal.                           The entire examined stomach was normal.                           The examined duodenum was normal. Complications:            No immediate complications. Estimated blood loss:                            Minimal. Estimated Blood Loss:     Estimated blood loss was minimal. Impression:               - Esophagogastric landmarks identified.                           - 1 cm hiatal hernia.                           - Z-line irregular, 39 cm from the incisors.                            Biopsied.                           -  Ectopic gastric mucosa in the upper third of the                            esophagus.                           - Normal esophagus otherwise.                           - Normal stomach.                           - Normal examined duodenum. Recommendation:           -  Patient has a contact number available for                            emergencies. The signs and symptoms of potential                            delayed complications were discussed with the                            patient. Return to normal activities tomorrow.                            Written discharge instructions were provided to the                            patient.                           - Resume previous diet.                           - Continue present medications.                           - Await pathology results. Elspeth P. Dinia Joynt, MD 09/04/2024 2:44:38 PM This report has been signed electronically.

## 2024-09-04 NOTE — Progress Notes (Signed)
 Pt's states no medical or surgical changes since previsit or office visit.

## 2024-09-07 ENCOUNTER — Telehealth: Payer: Self-pay

## 2024-09-07 NOTE — Telephone Encounter (Signed)
 Follow up call to pt, no answer.

## 2024-09-14 ENCOUNTER — Ambulatory Visit: Payer: Self-pay | Admitting: Gastroenterology

## 2024-09-14 LAB — SURGICAL PATHOLOGY

## 2024-11-05 ENCOUNTER — Ambulatory Visit: Admitting: Neurology

## 2024-12-21 ENCOUNTER — Ambulatory Visit: Admitting: Family Medicine

## 2024-12-23 ENCOUNTER — Other Ambulatory Visit: Payer: Self-pay

## 2024-12-28 ENCOUNTER — Ambulatory Visit: Payer: Self-pay | Admitting: Family Medicine

## 2025-06-23 ENCOUNTER — Encounter: Payer: Self-pay | Admitting: Family Medicine

## 2025-06-24 ENCOUNTER — Ambulatory Visit: Payer: Self-pay
